# Patient Record
Sex: Male | Born: 1940 | Race: Black or African American | Hispanic: No | Marital: Married | Smoking: Former smoker
Health system: Southern US, Community
[De-identification: ages and names within clinical notes are randomized; demographics above are authoritative.]

## PROBLEM LIST (undated history)

## (undated) DIAGNOSIS — H409 Unspecified glaucoma: Secondary | ICD-10-CM

## (undated) DIAGNOSIS — N400 Enlarged prostate without lower urinary tract symptoms: Secondary | ICD-10-CM

## (undated) DIAGNOSIS — I639 Cerebral infarction, unspecified: Secondary | ICD-10-CM

## (undated) DIAGNOSIS — E785 Hyperlipidemia, unspecified: Secondary | ICD-10-CM

## (undated) DIAGNOSIS — G8194 Hemiplegia, unspecified affecting left nondominant side: Secondary | ICD-10-CM

## (undated) DIAGNOSIS — I5032 Chronic diastolic (congestive) heart failure: Secondary | ICD-10-CM

## (undated) DIAGNOSIS — I509 Heart failure, unspecified: Secondary | ICD-10-CM

## (undated) DIAGNOSIS — R251 Tremor, unspecified: Secondary | ICD-10-CM

## (undated) DIAGNOSIS — I10 Essential (primary) hypertension: Secondary | ICD-10-CM

## (undated) DIAGNOSIS — Z8619 Personal history of other infectious and parasitic diseases: Secondary | ICD-10-CM

## (undated) DIAGNOSIS — F0390 Unspecified dementia without behavioral disturbance: Secondary | ICD-10-CM

## (undated) HISTORY — DX: Unspecified glaucoma: H40.9

## (undated) HISTORY — DX: Hyperlipidemia, unspecified: E78.5

## (undated) HISTORY — PX: OTHER SURGICAL HISTORY: SHX169

## (undated) HISTORY — DX: Cerebral infarction, unspecified: I63.9

## (undated) HISTORY — DX: Hemiplegia, unspecified affecting left nondominant side: G81.94

## (undated) HISTORY — DX: Heart failure, unspecified: I50.9

## (undated) HISTORY — DX: Essential (primary) hypertension: I10

## (undated) HISTORY — PX: CATARACT EXTRACTION: SUR2

## (undated) HISTORY — DX: Personal history of other infectious and parasitic diseases: Z86.19

## (undated) HISTORY — DX: Tremor, unspecified: R25.1

---

## 2019-10-24 ENCOUNTER — Telehealth: Payer: Self-pay

## 2019-10-24 NOTE — Telephone Encounter (Signed)
NOTES FROM [place], [place] [phone].  SENT REFERRAL TO SCHEDULING

## 2019-11-17 NOTE — Progress Notes (Signed)
Cardiology Office Note:    Date:  11/18/2019   ID:  Eric Barrett, DOB Feb 12, 1941, MRN [id number]  PCP:  [name], FNP  Cardiologist:  Donato Heinz, MD  Electrophysiologist:  None   Referring MD: [name], FNP   Chief Complaint  Patient presents with  . New Patient (Initial Visit)  . Hypertension    History of Present Illness:    Eric Barrett is a 79 y.o. male with a hx of chronic diastolic heart failure hypertension, hyperlipidemia, tobacco use, CVA who is referred by Dustin Folks, FNP for evaluation of hypertension and heart failure.  Moved to [facility] in September 2020.  Denies any chest pain, dyspnea, LE edema, lightheadedness, or syncope.  Reports occasional palpitations which he describes as feeling like heart is racing.  Occurs rarely.  States he has had a BP monitor at home but does not check his pressure.  Does not exercise regularly.  TTE on 03/06/2018 showed EF 60%, mild LVH, mild AI, mild MR, mild TR.  No past medical history on file.    Current Medications: Current Meds  Medication Sig  . ASPIRIN 81 PO Take 81 mg by mouth daily.  . Cholecalciferol (VITAMIN D3) 25 MCG (1000 UT) CAPS Take 1,000 Units by mouth daily.  Marland Kitchen losartan (COZAAR) 25 MG tablet Take 25 mg by mouth daily.  . multivitamin-lutein (OCUVITE-LUTEIN) CAPS capsule Take 1 capsule by mouth daily.  . Omega-3 Fatty Acids (FISH OIL) 1000 MG CAPS Take 1 capsule by mouth daily.  . rosuvastatin (CRESTOR) 20 MG tablet Take 20 mg by mouth daily.  . Travoprost, BAK Free, (TRAVATAN) 0.004 % SOLN ophthalmic solution Place 1 drop into both eyes at bedtime.     Allergies:   Patient has no known allergies.   Social History   Socioeconomic History  . Marital status: Married    Spouse name: Not on file  . Number of children: Not on file  . Years of education: Not on file  . Highest education level: Not on file  Occupational History  . Not on file  Tobacco Use  . Smoking status: Not on file   Substance and Sexual Activity  . Alcohol use: Not on file  . Drug use: Not on file  . Sexual activity: Not on file  Other Topics Concern  . Not on file  Social History Narrative  . Not on file   Social Determinants of Health   Financial Resource Strain:   . Difficulty of Paying Living Expenses:   Food Insecurity:   . Worried About Charity fundraiser in the Last Year:   . Arboriculturist in the Last Year:   Transportation Needs:   . Film/video editor (Medical):   Marland Kitchen Lack of Transportation (Non-Medical):   Physical Activity:   . Days of Exercise per Week:   . Minutes of Exercise per Session:   Stress:   . Feeling of Stress :   Social Connections:   . Frequency of Communication with Friends and Family:   . Frequency of Social Gatherings with Friends and Family:   . Attends Religious Services:   . Active Member of Clubs or Organizations:   . Attends Archivist Meetings:   Marland Kitchen Marital Status:      Family History: [name] family history is not on file.  ROS:   Please see the history of present illness.     All other systems reviewed and are negative.  EKGs/Labs/Other Studies Reviewed:    The following studies were reviewed today:   EKG:  EKG is ordered today.  The ekg ordered today demonstrates normal sinus rhythm, rate 74, nonspecific T wave flattening  Recent Labs: No results found for requested labs within last 8760 hours.  Recent Lipid Panel No results found for: CHOL, TRIG, HDL, CHOLHDL, VLDL, LDLCALC, LDLDIRECT  Physical Exam:    VS:  BP 140/84 (BP Location: Right Arm, Patient Position: Sitting, Cuff Size: Normal)   Pulse 74   Temp (!) 97 F (36.1 C)   Ht 5\' 8"  (1.727 m)   Wt 188 lb (85.3 kg)   BMI 28.59 kg/m     Wt Readings from Last 3 Encounters:  11/18/19 188 lb (85.3 kg)     GEN:in no acute distress HEENT: Normal NECK: No JVD LYMPHATICS: No lymphadenopathy CARDIAC: RRR, no murmurs, rubs, gallops RESPIRATORY:  Clear to  auscultation without rales, wheezing or rhonchi  ABDOMEN: Soft, non-tender, non-distended MUSCULOSKELETAL: Trace edema SKIN: Warm and dry NEUROLOGIC:  Alert and oriented x 3 PSYCHIATRIC:  Normal affect   ASSESSMENT:    1. Chronic diastolic heart failure (HCC)   2. Palpitations   3. History of CVA (cerebrovascular accident)   4. Hypertension, unspecified type   5. Hyperlipidemia, unspecified hyperlipidemia type    PLAN:    Chronic diastolic heart failure: Trace edema but otherwise appears euvolemic on exam.  Denies any dyspnea.  Will check TTE.  Will check BMP, BNP  Palpitations: Concerning for arrhythmia.  Given CVA of unclear cause, will evaluate further with 30-day monitor to rule out atrial fibrillation  Hypertension:  losartan 100 mg daily and amlodipine 5 mg daily listed by his PCP, he states he is only taking losartan 25 mg daily.  BP mildly elevated in clinic today.  Asked patient to check his prescriptions when he gets home and call to confirm which medications he is taking.   CVA: On aspirin 81 mg daily and rosuvastatin 20 mg daily  Hyperlipidemia: On rosuvastatin 20 mg daily.  Will check lipid panel.  RTC in 3 months   Medication Adjustments/Labs and Tests Ordered: Current medicines are reviewed at length with the patient today.  Concerns regarding medicines are outlined above.  Orders Placed This Encounter  Procedures  . Renal Function Panel  . CBC  . Brain natriuretic peptide  . Lipid panel  . CARDIAC EVENT MONITOR  . EKG 12-Lead  . ECHOCARDIOGRAM COMPLETE   No orders of the defined types were placed in this encounter.   Patient Instructions  Medication Instructions:  Your physician recommends that you continue on your current medications as directed. Please refer to the Current Medication list given to you today.  *If you need a refill on your cardiac medications before your next appointment, please call your pharmacy*   Lab Work: Today (Renal  function, CBC)  If you have labs (blood work) drawn today and your tests are completely normal, you will receive your results only by: Marland Kitchen MyChart Message (if you have MyChart) OR . A paper copy in the mail If you have any lab test that is abnormal or we need to change your treatment, we will call you to review the results.   Testing/Procedures: Your physician has recommended that you wear an event monitor. Event monitors are medical devices that record the heart's electrical activity. Doctors most often [place] these monitors to diagnose arrhythmias. Arrhythmias are problems with the speed or rhythm of the heartbeat. The monitor is  a small, portable device. You can wear one while you do your normal daily activities. This is usually used to diagnose what is causing palpitations/syncope (passing out).  Your physician has requested that you have an echocardiogram. Echocardiography is a painless test that uses sound waves to create images of your heart. It provides your doctor with information about the size and shape of your heart and how well your heart's chambers and valves are working. This procedure takes approximately one hour. There are no restrictions for this procedure. This will be done at our [facility] location:  [place]: At [facility], you and your health needs are our priority.  As part of our continuing mission to provide you with exceptional heart care, we have created designated Provider Care Teams.  These Care Teams include your primary Cardiologist (physician) and Advanced Practice Providers (APPs -  Physician Assistants and Nurse Practitioners) who all work together to provide you with the care you need, when you need it.  We recommend signing up for the patient portal called "MyChart".  Sign up information is provided on this After Visit Summary.  MyChart is used to connect with patients for Virtual Visits (Telemedicine).  Patients are able to  view lab/test results, encounter notes, upcoming appointments, etc.  Non-urgent messages can be sent to your provider as well.   To learn more about what you can do with MyChart, go to [email or web address].    Your next appointment:   3 month(s)  The format for your next appointment:   In Person  Provider:   Oswaldo Milian, MD   Other Instructions When you get home-please call with your current medication list.        Signed, Donato Heinz, MD  11/18/2019 2:20 PM    [place]

## 2019-11-18 ENCOUNTER — Other Ambulatory Visit: Payer: Self-pay

## 2019-11-18 ENCOUNTER — Ambulatory Visit (INDEPENDENT_AMBULATORY_CARE_PROVIDER_SITE_OTHER): Payer: Medicare Other | Admitting: Cardiology

## 2019-11-18 VITALS — BP 140/84 | HR 74 | Temp 97.0°F | Ht 68.0 in | Wt 188.0 lb

## 2019-11-18 DIAGNOSIS — I5032 Chronic diastolic (congestive) heart failure: Secondary | ICD-10-CM

## 2019-11-18 DIAGNOSIS — I10 Essential (primary) hypertension: Secondary | ICD-10-CM | POA: Diagnosis not present

## 2019-11-18 DIAGNOSIS — Z8673 Personal history of transient ischemic attack (TIA), and cerebral infarction without residual deficits: Secondary | ICD-10-CM

## 2019-11-18 DIAGNOSIS — R002 Palpitations: Secondary | ICD-10-CM

## 2019-11-18 DIAGNOSIS — E785 Hyperlipidemia, unspecified: Secondary | ICD-10-CM

## 2019-11-18 NOTE — Patient Instructions (Signed)
Medication Instructions:  Your physician recommends that you continue on your current medications as directed. Please refer to the Current Medication list given to you today.  *If you need a refill on your cardiac medications before your next appointment, please call your pharmacy*   Lab Work: Today (Renal function, CBC)  If you have labs (blood work) drawn today and your tests are completely normal, you will receive your results only by: Marland Kitchen MyChart Message (if you have MyChart) OR . A paper copy in the mail If you have any lab test that is abnormal or we need to change your treatment, we will call you to review the results.   Testing/Procedures: Your physician has recommended that you wear an event monitor. Event monitors are medical devices that record the heart's electrical activity. Doctors most often [place] these monitors to diagnose arrhythmias. Arrhythmias are problems with the speed or rhythm of the heartbeat. The monitor is a small, portable device. You can wear one while you do your normal daily activities. This is usually used to diagnose what is causing palpitations/syncope (passing out).  Your physician has requested that you have an echocardiogram. Echocardiography is a painless test that uses sound waves to create images of your heart. It provides your doctor with information about the size and shape of your heart and how well your heart's chambers and valves are working. This procedure takes approximately one hour. There are no restrictions for this procedure. This will be done at our [facility] location:  [place]: At [facility], you and your health needs are our priority.  As part of our continuing mission to provide you with exceptional heart care, we have created designated Provider Care Teams.  These Care Teams include your primary Cardiologist (physician) and Advanced Practice Providers (APPs -  Physician Assistants and Nurse  Practitioners) who all work together to provide you with the care you need, when you need it.  We recommend signing up for the patient portal called "MyChart".  Sign up information is provided on this After Visit Summary.  MyChart is used to connect with patients for Virtual Visits (Telemedicine).  Patients are able to view lab/test results, encounter notes, upcoming appointments, etc.  Non-urgent messages can be sent to your provider as well.   To learn more about what you can do with MyChart, go to [email or web address].    Your next appointment:   3 month(s)  The format for your next appointment:   In Person  Provider:   Oswaldo Milian, MD   Other Instructions When you get home-please call with your current medication list.

## 2019-11-19 ENCOUNTER — Telehealth: Payer: Self-pay | Admitting: *Deleted

## 2019-11-19 ENCOUNTER — Encounter: Payer: Self-pay | Admitting: *Deleted

## 2019-11-19 LAB — CBC
Hematocrit: 42.7 % (ref 37.5–51.0)
Hemoglobin: 14.1 g/dL (ref 13.0–17.7)
MCH: 27.3 pg (ref 26.6–33.0)
MCHC: 33 g/dL (ref 31.5–35.7)
MCV: 83 fL (ref 79–97)
Platelets: 210 10*3/uL (ref 150–450)
RBC: 5.17 x10E6/uL (ref 4.14–5.80)
RDW: 14.8 % (ref 11.6–15.4)
WBC: 7.1 10*3/uL (ref 3.4–10.8)

## 2019-11-19 LAB — BRAIN NATRIURETIC PEPTIDE: BNP: 15.8 pg/mL (ref 0.0–100.0)

## 2019-11-19 LAB — LIPID PANEL
Chol/HDL Ratio: 2.3 ratio (ref 0.0–5.0)
Cholesterol, Total: 118 mg/dL (ref 100–199)
HDL: 52 mg/dL (ref >39)
LDL Chol Calc (NIH): 54 mg/dL (ref 0–99)
Triglycerides: 49 mg/dL (ref 0–149)
VLDL Cholesterol Cal: 12 mg/dL (ref 5–40)

## 2019-11-19 LAB — RENAL FUNCTION PANEL
Albumin: 4.5 g/dL (ref 3.7–4.7)
BUN/Creatinine Ratio: 14 (ref 10–24)
BUN: 18 mg/dL (ref 8–27)
CO2: 21 mmol/L (ref 20–29)
Calcium: 9.5 mg/dL (ref 8.6–10.2)
Chloride: 108 mmol/L — ABNORMAL HIGH (ref 96–106)
Creatinine, Ser: 1.27 mg/dL (ref 0.76–1.27)
GFR calc Af Amer: 62 mL/min/{1.73_m2} (ref >59)
GFR calc non Af Amer: 54 mL/min/{1.73_m2} — ABNORMAL LOW (ref >59)
Glucose: 93 mg/dL (ref 65–99)
Phosphorus: 3.4 mg/dL (ref 2.8–4.1)
Potassium: 4.7 mmol/L (ref 3.5–5.2)
Sodium: 145 mmol/L — ABNORMAL HIGH (ref 134–144)

## 2019-11-19 NOTE — Telephone Encounter (Signed)
Patient enrolled for [employer] to ship a 30 day cardiac event montior to his home.  Instructions mailed to patient and will also be included in his monitor kit.

## 2019-11-20 ENCOUNTER — Encounter: Payer: Self-pay | Admitting: *Deleted

## 2019-11-23 ENCOUNTER — Ambulatory Visit (INDEPENDENT_AMBULATORY_CARE_PROVIDER_SITE_OTHER): Payer: Medicare Other

## 2019-11-23 DIAGNOSIS — I639 Cerebral infarction, unspecified: Secondary | ICD-10-CM | POA: Diagnosis not present

## 2019-11-23 DIAGNOSIS — Z8673 Personal history of transient ischemic attack (TIA), and cerebral infarction without residual deficits: Secondary | ICD-10-CM | POA: Diagnosis not present

## 2019-11-23 DIAGNOSIS — R002 Palpitations: Secondary | ICD-10-CM

## 2019-11-27 ENCOUNTER — Encounter: Payer: Self-pay | Admitting: Neurology

## 2019-11-27 ENCOUNTER — Telehealth: Payer: Self-pay | Admitting: Neurology

## 2019-11-27 ENCOUNTER — Other Ambulatory Visit: Payer: Self-pay

## 2019-11-27 ENCOUNTER — Ambulatory Visit (INDEPENDENT_AMBULATORY_CARE_PROVIDER_SITE_OTHER): Payer: Medicare Other | Admitting: Neurology

## 2019-11-27 VITALS — BP 133/77 | HR 71 | Temp 97.4°F | Ht 68.0 in | Wt 187.5 lb

## 2019-11-27 DIAGNOSIS — Z8673 Personal history of transient ischemic attack (TIA), and cerebral infarction without residual deficits: Secondary | ICD-10-CM | POA: Diagnosis not present

## 2019-11-27 DIAGNOSIS — R269 Unspecified abnormalities of gait and mobility: Secondary | ICD-10-CM

## 2019-11-27 DIAGNOSIS — G3281 Cerebellar ataxia in diseases classified elsewhere: Secondary | ICD-10-CM | POA: Diagnosis not present

## 2019-11-27 NOTE — Progress Notes (Signed)
PATIENT: Eric Barrett DOB: 08/14/1940  Chief Complaint  Patient presents with  . Leg Tremors    Reports bilateral leg tremors. Symptoms do not affect his walking. He does have some difficulty getting in and out of his car but feels this is due to weakness. Hx of stoke.  Marland Kitchen PCP    [name], FNP     HISTORICAL  Eric Barrett is a 79 year old male, seen in request by his primary care nurse practitioner Dustin Folks A for evaluation of bilateral lower extremity tremor, initial evaluation was on November 27, 2019.  I have reviewed and summarized the referring note from the referring physician.  He has past medical history of hypertension, hyperlipidemia, tobacco use, reported history of stroke in 2019, presented with acute onset dysarthria, left upper and lower extremity weakness in 2019, was treated at [place], moved to [place] in September 2020, lives in apartment, continue complains of mild left leg weakness, tremorous unsteady sensation when bearing weight, denies significant gait abnormality  He was seen by cardiologist recently, her cardiogram showed ejection fraction 60% in [id number], chronic diastolic heart failure, trace edema, ordered 30 days cardiac monitoring  Laboratory evaluations showed normal BMP, CBC, lipid panel, BMP creatinine of 1.27, REVIEW OF SYSTEMS: Full 14 system review of systems performed and notable only for as above All other review of systems were negative.  ALLERGIES: No Known Allergies  HOME MEDICATIONS: Current Outpatient Medications  Medication Sig Dispense Refill  . amLODipine (NORVASC) 5 MG tablet Take 5 mg by mouth daily.    . ASPIRIN 81 PO Take 2 tablets by mouth daily.     . Cholecalciferol (VITAMIN D3) 25 MCG (1000 UT) CAPS Take 1,000 Units by mouth daily.    Marland Kitchen losartan (COZAAR) 100 MG tablet Take 100 mg by mouth daily.    . multivitamin-lutein (OCUVITE-LUTEIN) CAPS capsule Take 1 capsule by mouth daily.    . Omega-3 Fatty Acids (FISH  OIL) 1000 MG CAPS Take 1 capsule by mouth daily.    . rosuvastatin (CRESTOR) 20 MG tablet Take 20 mg by mouth daily.    . Travoprost, BAK Free, (TRAVATAN) 0.004 % SOLN ophthalmic solution Place 1 drop into both eyes at bedtime.     No current facility-administered medications for this visit.    PAST MEDICAL HISTORY: Past Medical History:  Diagnosis Date  . CHF (congestive heart failure) ([place])   . Glaucoma   . Hemiparesis of left nondominant side ([place])   . History of shingles   . HLD (hyperlipidemia)   . Hypertension   . Stroke ([place])   . Tremor     PAST SURGICAL HISTORY: Past Surgical History:  Procedure Laterality Date  . No past surgery      FAMILY HISTORY: Family History  Problem Relation Age of Onset  . Heart attack Mother   . Heart attack Father     SOCIAL HISTORY: Social History   Socioeconomic History  . Marital status: Married    Spouse name: Not on file  . Number of children: 0  . Years of education: 57  . Highest education level: High school graduate  Occupational History  . Occupation: Retired  Tobacco Use  . Smoking status: Former Research scientist (life sciences)  . Smokeless tobacco: Never Used  Substance and Sexual Activity  . Alcohol use: Not Currently  . Drug use: Never  . Sexual activity: Not on file  Other Topics Concern  . Not on file  Social History Narrative  Lives at home with his wife.   Right-handed.   One cup coffee per day.   Social Determinants of Health   Financial Resource Strain:   . Difficulty of Paying Living Expenses:   Food Insecurity:   . Worried About Charity fundraiser in the Last Year:   . Arboriculturist in the Last Year:   Transportation Needs:   . Film/video editor (Medical):   Marland Kitchen Lack of Transportation (Non-Medical):   Physical Activity:   . Days of Exercise per Week:   . Minutes of Exercise per Session:   Stress:   . Feeling of Stress :   Social Connections:   . Frequency of Communication with Friends and Family:   .  Frequency of Social Gatherings with Friends and Family:   . Attends Religious Services:   . Active Member of Clubs or Organizations:   . Attends Archivist Meetings:   Marland Kitchen Marital Status:   Intimate Partner Violence:   . Fear of Current or Ex-Partner:   . Emotionally Abused:   Marland Kitchen Physically Abused:   . Sexually Abused:      PHYSICAL EXAM   Vitals:   11/27/19 1348  BP: 133/77  Pulse: 71  Temp: (!) 97.4 F (36.3 C)  Weight: 187 lb 8 oz (85 kg)  Height: 5\' 8"  (1.727 m)    Not recorded      Body mass index is 28.51 kg/m.  PHYSICAL EXAMNIATION:  Gen: NAD, conversant, well nourised, well groomed                     Cardiovascular: Regular rate rhythm, no peripheral edema, warm, nontender. Eyes: Conjunctivae clear without exudates or hemorrhage Neck: Supple, no carotid bruits. Pulmonary: Clear to auscultation bilaterally   NEUROLOGICAL EXAM:  MENTAL STATUS: Speech:    Speech is normal; fluent and spontaneous with normal comprehension.  Cognition:     Orientation to time, place and person     Normal recent and remote memory     Normal Attention span and concentration     Normal Language, naming, repeating,spontaneous speech     Fund of knowledge   CRANIAL NERVES: CN II: Visual fields are full to confrontation. Pupils are round equal and briskly reactive to light. CN III, IV, VI: extraocular movement are normal. No ptosis. CN V: Facial sensation is intact to light touch CN VII: Face is symmetric with normal eye closure  CN VIII: Hearing is normal to causal conversation. CN IX, X: Phonation is normal. CN XI: Head turning and shoulder shrug are intact  MOTOR: Mild fixation of left upper extremity upon rapid rotating movement, mild left leg drift  REFLEXES: Hyperreflexia at the left upper and lower extremity,. Plantar responses are extensor on the left side  SENSORY: Intact to light touch, pinprick and vibratory sensation are intact in fingers and  toes.  COORDINATION: There is no trunk or limb dysmetria noted.  GAIT/STANCE: Needs to push up to get up from seated position, steady, left foot tend to pointing outwards,  DIAGNOSTIC DATA (LABS, IMAGING, TESTING) - I reviewed patient records, labs, notes, testing and imaging myself where available.   ASSESSMENT AND PLAN  Eric Barrett is a 79 y.o. male   Reported history of stroke  Per patient presented with sudden onset dysarthria, left arm, leg weakness in 2019, this was treated at [facility]  Mild residual left upper and lower extremity weakness  MRI of the brain  He  has vascular risk factor of aging, hypertension, hyperlipidemia  Continue aspirin 81 mg daily  He Is wearing 30 days cardiac monitoring  Gait abnormality  There are residual mild left upper and lower extremity weakness, likely contributed to his complaints of unsteadiness   [name], M.D. Ph.D.  [facility] [address] [place], [place] [address] Ph: [phone] Fax: [phone]  CC: Referring Provider

## 2019-11-27 NOTE — Telephone Encounter (Signed)
Medicare order sent to GI. No auth they will reach out to the patient to schedule.  

## 2019-12-03 ENCOUNTER — Ambulatory Visit (HOSPITAL_COMMUNITY): Payer: Medicare Other | Attending: Cardiovascular Disease

## 2019-12-03 ENCOUNTER — Other Ambulatory Visit: Payer: Self-pay

## 2019-12-03 DIAGNOSIS — R002 Palpitations: Secondary | ICD-10-CM | POA: Diagnosis not present

## 2019-12-03 DIAGNOSIS — I5032 Chronic diastolic (congestive) heart failure: Secondary | ICD-10-CM

## 2019-12-03 DIAGNOSIS — I10 Essential (primary) hypertension: Secondary | ICD-10-CM | POA: Insufficient documentation

## 2019-12-12 ENCOUNTER — Other Ambulatory Visit: Payer: Self-pay | Admitting: *Deleted

## 2019-12-12 DIAGNOSIS — Z01812 Encounter for preprocedural laboratory examination: Secondary | ICD-10-CM

## 2019-12-12 DIAGNOSIS — I77819 Aortic ectasia, unspecified site: Secondary | ICD-10-CM

## 2019-12-12 DIAGNOSIS — I712 Thoracic aortic aneurysm, without rupture, unspecified: Secondary | ICD-10-CM

## 2019-12-15 ENCOUNTER — Telehealth: Payer: Self-pay | Admitting: Cardiology

## 2019-12-15 NOTE — Telephone Encounter (Signed)
Patient is unsure when he need to remove his monitor.

## 2019-12-15 NOTE — Telephone Encounter (Signed)
Spoke with patient regarding appointemtn for MRA chest/aosrta scheduled Thursday 01/01/20 at 5:00pm at [facility]---arrival time is 4:30 pm 1st floor admissions office----patient to come in 12/23/19 for lab work.

## 2019-12-15 NOTE — Telephone Encounter (Signed)
Returned the call to the patient. He stated that he already had his answer (30 day)

## 2019-12-24 ENCOUNTER — Other Ambulatory Visit: Payer: Self-pay | Admitting: Cardiology

## 2019-12-24 ENCOUNTER — Other Ambulatory Visit: Payer: Medicare Other | Admitting: *Deleted

## 2019-12-24 ENCOUNTER — Other Ambulatory Visit: Payer: Self-pay | Admitting: *Deleted

## 2019-12-24 ENCOUNTER — Other Ambulatory Visit: Payer: Self-pay

## 2019-12-24 DIAGNOSIS — I77819 Aortic ectasia, unspecified site: Secondary | ICD-10-CM

## 2019-12-24 DIAGNOSIS — Z8673 Personal history of transient ischemic attack (TIA), and cerebral infarction without residual deficits: Secondary | ICD-10-CM

## 2019-12-24 DIAGNOSIS — I639 Cerebral infarction, unspecified: Secondary | ICD-10-CM

## 2019-12-24 DIAGNOSIS — R002 Palpitations: Secondary | ICD-10-CM

## 2019-12-24 DIAGNOSIS — Z01812 Encounter for preprocedural laboratory examination: Secondary | ICD-10-CM

## 2019-12-24 LAB — BASIC METABOLIC PANEL
BUN/Creatinine Ratio: 13 (ref 10–24)
BUN: 17 mg/dL (ref 8–27)
CO2: 27 mmol/L (ref 20–29)
Calcium: 9.9 mg/dL (ref 8.6–10.2)
Chloride: 102 mmol/L (ref 96–106)
Creatinine, Ser: 1.36 mg/dL — ABNORMAL HIGH (ref 0.76–1.27)
GFR calc Af Amer: 57 mL/min/{1.73_m2} — ABNORMAL LOW (ref >59)
GFR calc non Af Amer: 49 mL/min/{1.73_m2} — ABNORMAL LOW (ref >59)
Glucose: 95 mg/dL (ref 65–99)
Potassium: 4.9 mmol/L (ref 3.5–5.2)
Sodium: 141 mmol/L (ref 134–144)

## 2019-12-30 ENCOUNTER — Telehealth: Payer: Self-pay | Admitting: Cardiology

## 2019-12-30 NOTE — Telephone Encounter (Signed)
Patient's wife is requesting to reschedule MR MRA CHEST W WO CONTRAST currentyly scheduled for 01/01/20 at 5:00 PM. She states the patient prefers an appointment that is sooner in the day. Please call.

## 2020-01-01 ENCOUNTER — Ambulatory Visit (HOSPITAL_COMMUNITY): Payer: Medicare Other

## 2020-01-08 ENCOUNTER — Telehealth: Payer: Self-pay | Admitting: Cardiology

## 2020-01-08 NOTE — Telephone Encounter (Signed)
Pt stated they were returning a call. Looked through chart to see who may have called them.

## 2020-01-13 ENCOUNTER — Encounter: Payer: Self-pay | Admitting: *Deleted

## 2020-01-28 ENCOUNTER — Ambulatory Visit (HOSPITAL_COMMUNITY)
Admission: RE | Admit: 2020-01-28 | Discharge: 2020-01-28 | Disposition: A | Discharge status: Home / Self Care | Payer: Medicare Other | Source: Ambulatory Visit | Attending: Cardiology | Admitting: Cardiology

## 2020-01-28 ENCOUNTER — Other Ambulatory Visit: Payer: Self-pay

## 2020-01-28 DIAGNOSIS — I77819 Aortic ectasia, unspecified site: Secondary | ICD-10-CM | POA: Diagnosis present

## 2020-01-28 MED ORDER — GADOBUTROL 1 MMOL/ML IV SOLN
9.0000 mL | Freq: Once | INTRAVENOUS | Status: AC | PRN
  Administered 2020-01-28: 9 mL via INTRAVENOUS

## 2020-02-15 NOTE — Progress Notes (Deleted)
Cardiology Office Note:    Date:  02/15/2020   ID:  Eric Barrett, DOB 1941/01/01, MRN [id number]  PCP:  [name], FNP  Cardiologist:  Donato Heinz, MD  Electrophysiologist:  None   Referring MD: [name], FNP   No chief complaint on file.   History of Present Illness:    Eric Barrett is a 79 y.o. male with a hx of chronic diastolic heart failure hypertension, hyperlipidemia, tobacco use, CVA who presents for follow-up.  He was referred by Dustin Folks, FNP for evaluation of hypertension and heart failure, initially seen on 11/18/2019.  Moved to [facility] in September 2020.  Denies any chest pain, dyspnea, LE edema, lightheadedness, or syncope.  Reports occasional palpitations which he describes as feeling like heart is racing.  Occurs rarely.  States he has had a BP monitor at home but does not check his pressure.  Does not exercise regularly.  TTE on 03/06/2018 showed EF 60%, mild LVH, mild AI, mild MR, mild TR.  Echocardiogram on 12/03/2019 showed normal biventricular function, grade 1 diastolic dysfunction, mild AI, mild ascending aortic dilatation measuring 38 mm.  30-day cardiac monitor on 12/24/2019 showed no significant abnormalities.  MRA chest on 01/28/2020 showed mild ascending aortic ectasia measuring 36 mm.  Past Medical History:  Diagnosis Date  . CHF (congestive heart failure) ([place])   . Glaucoma   . Hemiparesis of left nondominant side ([place])   . History of shingles   . HLD (hyperlipidemia)   . Hypertension   . Stroke ([place])   . Tremor       Current Medications: No outpatient medications have been marked as taking for the 02/16/20 encounter (Appointment) with Donato Heinz, MD.     Allergies:   Patient has no known allergies.   Social History   Socioeconomic History  . Marital status: Married    Spouse name: Not on file  . Number of children: 0  . Years of education: 85  . Highest education level: High school graduate  Occupational  History  . Occupation: Retired  Tobacco Use  . Smoking status: Former Research scientist (life sciences)  . Smokeless tobacco: Never Used  Substance and Sexual Activity  . Alcohol use: Not Currently  . Drug use: Never  . Sexual activity: Not on file  Other Topics Concern  . Not on file  Social History Narrative   Lives at home with his wife.   Right-handed.   One cup coffee per day.   Social Determinants of Health   Financial Resource Strain:   . Difficulty of Paying Living Expenses:   Food Insecurity:   . Worried About Charity fundraiser in the Last Year:   . Arboriculturist in the Last Year:   Transportation Needs:   . Film/video editor (Medical):   Marland Kitchen Lack of Transportation (Non-Medical):   Physical Activity:   . Days of Exercise per Week:   . Minutes of Exercise per Session:   Stress:   . Feeling of Stress :   Social Connections:   . Frequency of Communication with Friends and Family:   . Frequency of Social Gatherings with Friends and Family:   . Attends Religious Services:   . Active Member of Clubs or Organizations:   . Attends Archivist Meetings:   Marland Kitchen Marital Status:      Family History: [name] family history includes Heart attack in his father and mother.  ROS:   Please see  the history of present illness.     All other systems reviewed and are negative.  EKGs/Labs/Other Studies Reviewed:    The following studies were reviewed today:   EKG:  EKG is ordered today.  The ekg ordered today demonstrates normal sinus rhythm, rate 74, nonspecific T wave flattening  Recent Labs: 11/18/2019: BNP 15.8; Hemoglobin 14.1; Platelets 210 12/24/2019: BUN 17; Creatinine, Ser 1.36; Potassium 4.9; Sodium 141  Recent Lipid Panel    Component Value Date/Time   CHOL 118 11/18/2019 1446   TRIG 49 11/18/2019 1446   HDL 52 11/18/2019 1446   CHOLHDL 2.3 11/18/2019 1446   LDLCALC 54 11/18/2019 1446    Physical Exam:    VS:  There were no vitals taken for this visit.    Wt  Readings from Last 3 Encounters:  11/27/19 187 lb 8 oz (85 kg)  11/18/19 188 lb (85.3 kg)     GEN:in no acute distress HEENT: Normal NECK: No JVD LYMPHATICS: No lymphadenopathy CARDIAC: RRR, no murmurs, rubs, gallops RESPIRATORY:  Clear to auscultation without rales, wheezing or rhonchi  ABDOMEN: Soft, non-tender, non-distended MUSCULOSKELETAL: Trace edema SKIN: Warm and dry NEUROLOGIC:  Alert and oriented x 3 PSYCHIATRIC:  Normal affect   ASSESSMENT:    No diagnosis found. PLAN:    Chronic diastolic heart failure: Reported history, only grade 1 diastolic dysfunction on recent echo.  BNP 16 at initial clinic visit, does not appear volume overloaded  Palpitations: Concerning for arrhythmia.  Given CVA of unclear cause, 30-day monitor was ordered.  Showed no evidence of atrial fibrillation.  Hypertension:  losartan 100 mg daily and amlodipine 5 mg daily listed by his PCP, he states he is only taking losartan 25 mg daily.  BP mildly elevated in clinic today.  Asked patient to check his prescriptions when he gets home and call to confirm which medications he is taking.   CVA: On aspirin 81 mg daily and rosuvastatin 20 mg daily  Hyperlipidemia: On rosuvastatin 20 mg daily.  LDL 54 on 11/18/2019.*  RTC in***   Medication Adjustments/Labs and Tests Ordered: Current medicines are reviewed at length with the patient today.  Concerns regarding medicines are outlined above.  No orders of the defined types were placed in this encounter.  No orders of the defined types were placed in this encounter.   There are no Patient Instructions on file for this visit.   Signed, Donato Heinz, MD  02/15/2020 10:22 PM    [facility]

## 2020-02-16 ENCOUNTER — Ambulatory Visit: Payer: Medicare Other | Admitting: Cardiology

## 2020-02-16 ENCOUNTER — Encounter: Payer: Self-pay | Admitting: Cardiology

## 2020-02-16 ENCOUNTER — Telehealth: Payer: Self-pay

## 2020-02-16 NOTE — Telephone Encounter (Addendum)
Pt had appt today to see Dr. Gardiner Rhyme but he had taken a cab to the [employer].. he was confused that his appt was at [facility]. He said he was unable to get another cab to take him to the other office so he asked that we call him re: his MRI results and to determine if he needs follow up at this time or can it be postponed for another time based on his results.   His wife Lelon Frohlich [phone]

## 2020-02-17 NOTE — Telephone Encounter (Signed)
Spoke to wife and patient-aware appt not urgent, all testing okay per Dr. Gardiner Rhyme.   Appt cancelled 7/15 with APP and rescheduled for 9/3 with Dr. Gardiner Rhyme, patient and wife aware and verbalized understanding.

## 2020-02-19 ENCOUNTER — Telehealth: Payer: Medicare Other | Admitting: Adult Health

## 2020-02-26 ENCOUNTER — Encounter: Payer: Self-pay | Admitting: Neurology

## 2020-02-26 ENCOUNTER — Telehealth: Payer: Self-pay | Admitting: Neurology

## 2020-02-26 ENCOUNTER — Ambulatory Visit (INDEPENDENT_AMBULATORY_CARE_PROVIDER_SITE_OTHER): Payer: Medicare Other | Admitting: Neurology

## 2020-02-26 VITALS — BP 129/66 | HR 69 | Ht 68.0 in | Wt 186.2 lb

## 2020-02-26 DIAGNOSIS — Z8673 Personal history of transient ischemic attack (TIA), and cerebral infarction without residual deficits: Secondary | ICD-10-CM | POA: Diagnosis not present

## 2020-02-26 DIAGNOSIS — R269 Unspecified abnormalities of gait and mobility: Secondary | ICD-10-CM

## 2020-02-26 NOTE — Progress Notes (Signed)
PATIENT: Eric Barrett DOB: 12-23-1940  REASON FOR VISIT: follow up HISTORY FROM: patient  HISTORY OF PRESENT ILLNESS: Today 02/26/20  HISTORY Eric Barrett is a 79 year old male, seen in request by his primary care nurse practitioner Dustin Folks A for evaluation of bilateral lower extremity tremor, initial evaluation was on November 27, 2019.  I have reviewed and summarized the referring note from the referring physician.  He has past medical history of hypertension, hyperlipidemia, tobacco use, reported history of stroke in 2019, presented with acute onset dysarthria, left upper and lower extremity weakness in 2019, was treated at [place], moved to [place] in September 2020, lives in apartment, continue complains of mild left leg weakness, tremorous unsteady sensation when bearing weight, denies significant gait abnormality  He was seen by cardiologist recently, her cardiogram showed ejection fraction 60% in 3009, chronic diastolic heart failure, trace edema, ordered 30 days cardiac monitoring   Update February 26, 2020 SS: Here today to follow-up on sensation of leg weakness, mostly in the left and tremor in the leg, usually when getting in and out of the car.  Has significantly improved, as he is exercising and walking more, but sensation is still present.  Describes this post his stroke in 2019 in [place]. Onset stroke at that time presented with generalized weakness, confusion.  Remains on aspirin.  Has follow-up with cardiology, MRA chest showed mild aortic dilation.  MRI of the brain was ordered but is yet to be completed. No weakness of the left arm reported. Cardiac monitor study showed no significant heart rhythm abnormalities.  REVIEW OF SYSTEMS: Out of a complete 14 system review of symptoms, the patient complains only of the following symptoms, and all other reviewed systems are negative.  N/A  ALLERGIES: No Known Allergies  HOME MEDICATIONS: Outpatient Medications Prior  to Visit  Medication Sig Dispense Refill  . amLODipine (NORVASC) 5 MG tablet Take 5 mg by mouth daily.    . ASPIRIN 81 PO Take 2 tablets by mouth daily.     . Cholecalciferol (VITAMIN D3) 25 MCG (1000 UT) CAPS Take 1,000 Units by mouth daily.    Marland Kitchen losartan (COZAAR) 100 MG tablet Take 100 mg by mouth daily.    . multivitamin-lutein (OCUVITE-LUTEIN) CAPS capsule Take 1 capsule by mouth daily.    . Omega-3 Fatty Acids (FISH OIL) 1000 MG CAPS Take 1 capsule by mouth daily.    . rosuvastatin (CRESTOR) 20 MG tablet Take 20 mg by mouth daily.    . Travoprost, BAK Free, (TRAVATAN) 0.004 % SOLN ophthalmic solution Place 1 drop into both eyes at bedtime.     No facility-administered medications prior to visit.    PAST MEDICAL HISTORY: Past Medical History:  Diagnosis Date  . CHF (congestive heart failure) ([place])   . Glaucoma   . Hemiparesis of left nondominant side ([place])   . History of shingles   . HLD (hyperlipidemia)   . Hypertension   . Stroke ([place])   . Tremor     PAST SURGICAL HISTORY: Past Surgical History:  Procedure Laterality Date  . No past surgery      FAMILY HISTORY: Family History  Problem Relation Age of Onset  . Heart attack Mother   . Heart attack Father     SOCIAL HISTORY: Social History   Socioeconomic History  . Marital status: Married    Spouse name: Not on file  . Number of children: 0  . Years of education: 42  . Highest  education level: High school graduate  Occupational History  . Occupation: Retired  Tobacco Use  . Smoking status: Former Research scientist (life sciences)  . Smokeless tobacco: Never Used  Substance and Sexual Activity  . Alcohol use: Not Currently  . Drug use: Never  . Sexual activity: Not on file  Other Topics Concern  . Not on file  Social History Narrative   Lives at home with his wife.   Right-handed.   One cup coffee per day.   Social Determinants of Health   Financial Resource Strain:   . Difficulty of Paying Living Expenses:   Food  Insecurity:   . Worried About Charity fundraiser in the Last Year:   . Arboriculturist in the Last Year:   Transportation Needs:   . Film/video editor (Medical):   Marland Kitchen Lack of Transportation (Non-Medical):   Physical Activity:   . Days of Exercise per Week:   . Minutes of Exercise per Session:   Stress:   . Feeling of Stress :   Social Connections:   . Frequency of Communication with Friends and Family:   . Frequency of Social Gatherings with Friends and Family:   . Attends Religious Services:   . Active Member of Clubs or Organizations:   . Attends Archivist Meetings:   Marland Kitchen Marital Status:   Intimate Partner Violence:   . Fear of Current or Ex-Partner:   . Emotionally Abused:   Marland Kitchen Physically Abused:   . Sexually Abused:    PHYSICAL EXAM  Vitals:   02/26/20 1454  BP: (!) 129/66  Pulse: 69  Weight: 186 lb 3.2 oz (84.5 kg)  Height: 5\' 8"  (1.727 m)   Body mass index is 28.31 kg/m.  Generalized: Well developed, in no acute distress   Neurological examination  Mentation: Alert oriented to time, place, history taking. Follows all commands speech and language fluent Cranial nerve II-XII: Pupils were equal round reactive to light. Extraocular movements were full, visual field were full on confrontational test. Facial sensation and strength were normal. Head turning and shoulder shrug  were normal and symmetric. Motor: The motor testing reveals 5 over 5 strength of all 4 extremities. Good symmetric motor tone is noted throughout. No tremor noted. Sensory: Sensory testing is intact to soft touch on all 4 extremities. No evidence of extinction is noted.  Coordination: Cerebellar testing reveals good finger-nose-finger and heel-to-shin bilaterally.  Gait and station: Gait is steady, tendency to rotate left foot outward Reflexes: Deep tendon reflexes are symmetric and normal bilaterally (Dr. Krista Blue previously noted hyperreflexia on the left upper and lower)  DIAGNOSTIC DATA  (LABS, IMAGING, TESTING) - I reviewed patient records, labs, notes, testing and imaging myself where available.  Lab Results  Component Value Date   WBC 7.1 11/18/2019   HGB 14.1 11/18/2019   HCT 42.7 11/18/2019   MCV 83 11/18/2019   PLT 210 11/18/2019      Component Value Date/Time   NA 141 12/24/2019 1052   K 4.9 12/24/2019 1052   CL 102 12/24/2019 1052   CO2 27 12/24/2019 1052   GLUCOSE 95 12/24/2019 1052   BUN 17 12/24/2019 1052   CREATININE 1.36 (H) 12/24/2019 1052   CALCIUM 9.9 12/24/2019 1052   ALBUMIN 4.5 11/18/2019 1447   GFRNONAA 49 (L) 12/24/2019 1052   GFRAA 57 (L) 12/24/2019 1052   Lab Results  Component Value Date   CHOL 118 11/18/2019   HDL 52 11/18/2019   LDLCALC 54 11/18/2019  TRIG 49 11/18/2019   CHOLHDL 2.3 11/18/2019   No results found for: HGBA1C No results found for: VITAMINB12 No results found for: TSH    ASSESSMENT AND PLAN 79 y.o. year old male  has a past medical history of CHF (congestive heart failure) ([place]), Glaucoma, Hemiparesis of left nondominant side ([place]), History of shingles, HLD (hyperlipidemia), Hypertension, Stroke ([place]), and Tremor. here with:  1.  Reported history of stroke -History of stroke in [place], reported post stroke, leg weakness, mostly the leg but could be both, tremor in left leg, symptoms most notable when getting in and out of cars -MRI of the brain has yet to be completed, I will get this set up -Has vascular risk factors of aging, hypertension, hyperlipidemia -Recommend continue aspirin 81 mg daily -Cardiac monitor study did not show significant heart rhythm abnormalities -Symptoms much improved with walking, and exercise -He will follow-up in 6 months or sooner if needed, will update once MRI results   I spent 30 minutes of face-to-face and non-face-to-face time with patient.  This included previsit chart review, lab review, study review, order entry, electronic health record documentation, patient  education.  Butler Denmark, AGNP-C, DNP 02/26/2020, 4:51 PM [facility] [address] [place], [place] [address] [phone]

## 2020-02-26 NOTE — Telephone Encounter (Signed)
Please get patient set up for MRI of the brain, was ordered by Dr. Krista Blue in April, was not completed.

## 2020-02-26 NOTE — Patient Instructions (Signed)
Continue current medications Get you set up for MRI of the brain  See you back in 6 months

## 2020-02-28 NOTE — Telephone Encounter (Signed)
Gi reached out to the patient on 12/01/19 and left a voicemail. Order sent again to GI. They will reach out to the patient to schedule.

## 2020-04-08 NOTE — Progress Notes (Addendum)
Cardiology Office Note:    Date:  04/09/2020   ID:  Eric Barrett, DOB January 10, 1941, MRN [id number]  PCP:  [name], FNP  Cardiologist:  Donato Heinz, MD  Electrophysiologist:  None   Referring MD: [name], FNP   Chief Complaint  Patient presents with  . Palpitations    History of Present Illness:    Eric Barrett is a 79 y.o. male with a hx of chronic diastolic heart failure hypertension, hyperlipidemia, tobacco use, CVA who presents for follow-up.  He was referred by Dustin Folks, FNP for evaluation of hypertension and heart failure, initially seen on 11/18/2019.  Moved to [facility] in September 2020.  Denies any chest pain, dyspnea, LE edema, lightheadedness, or syncope.  Reports occasional palpitations which he describes as feeling like heart is racing.  Occurs rarely.  States he has had a BP monitor at home but does not check his pressure.  Does not exercise regularly.  TTE on 03/06/2018 showed EF 60%, mild LVH, mild AI, mild MR, mild TR.  Echocardiogram 11/25/2019 showed normal biventricular function, grade 1 diastolic dysfunction, mild AI, mild dilatation of the ascending aorta measuring 38 mm.  Cardiac monitor showed no significant abnormalities.  MRA chest showed mild ectasia of the ascending aorta measuring 36 mm.  Since last clinic visit, he reports that he has been doing well.  He denies any chest pain or dyspnea.  Reports 1 episode of lightheadedness recently but denies any syncope.  Reports rare palpitations lasting for up to 1 minute, can be months between episodes.  Denies any lower extremity edema.  He has not been exercising but is trying to start walking more.  He has not been checking his BP at home.   Wt Readings from Last 3 Encounters:  04/09/20 183 lb 8 oz (83.2 kg)  02/26/20 186 lb 3.2 oz (84.5 kg)  11/27/19 187 lb 8 oz (85 kg)     Past Medical History:  Diagnosis Date  . CHF (congestive heart failure) ([place])   . Glaucoma   . Hemiparesis of  left nondominant side ([place])   . History of shingles   . HLD (hyperlipidemia)   . Hypertension   . Stroke ([place])   . Tremor       Current Medications: Current Meds  Medication Sig  . amLODipine (NORVASC) 5 MG tablet Take 5 mg by mouth daily.  . ASPIRIN 81 PO Take 2 tablets by mouth daily.   . Cholecalciferol (VITAMIN D3) 25 MCG (1000 UT) CAPS Take 1,000 Units by mouth daily.  . multivitamin-lutein (OCUVITE-LUTEIN) CAPS capsule Take 1 capsule by mouth daily.  . Omega-3 Fatty Acids (FISH OIL) 1000 MG CAPS Take 1 capsule by mouth daily.  . rosuvastatin (CRESTOR) 20 MG tablet Take 20 mg by mouth daily.  . Travoprost, BAK Free, (TRAVATAN) 0.004 % SOLN ophthalmic solution Place 1 drop into both eyes at bedtime.  . [DISCONTINUED] losartan (COZAAR) 100 MG tablet Take 100 mg by mouth daily.     Allergies:   Patient has no known allergies.   Social History   Socioeconomic History  . Marital status: Married    Spouse name: Not on file  . Number of children: 0  . Years of education: 68  . Highest education level: High school graduate  Occupational History  . Occupation: Retired  Tobacco Use  . Smoking status: Former Research scientist (life sciences)  . Smokeless tobacco: Never Used  Substance and Sexual Activity  . Alcohol use: Not Currently  .  Drug use: Never  . Sexual activity: Not on file  Other Topics Concern  . Not on file  Social History Narrative   Lives at home with his wife.   Right-handed.   One cup coffee per day.   Social Determinants of Health   Financial Resource Strain:   . Difficulty of Paying Living Expenses: Not on file  Food Insecurity:   . Worried About Charity fundraiser in the Last Year: Not on file  . Ran Out of Food in the Last Year: Not on file  Transportation Needs:   . Lack of Transportation (Medical): Not on file  . Lack of Transportation (Non-Medical): Not on file  Physical Activity:   . Days of Exercise per Week: Not on file  . Minutes of Exercise per Session: Not  on file  Stress:   . Feeling of Stress : Not on file  Social Connections:   . Frequency of Communication with Friends and Family: Not on file  . Frequency of Social Gatherings with Friends and Family: Not on file  . Attends Religious Services: Not on file  . Active Member of Clubs or Organizations: Not on file  . Attends Archivist Meetings: Not on file  . Marital Status: Not on file     Family History: [name] family history includes Heart attack in his father and mother.  ROS:   Please see the history of present illness.     All other systems reviewed and are negative.  EKGs/Labs/Other Studies Reviewed:    The following studies were reviewed today:   EKG:  EKG is ordered today.  The ekg ordered today demonstrates normal sinus rhythm, rate 74, nonspecific T wave flattening  Recent Labs: 11/18/2019: BNP 15.8; Hemoglobin 14.1; Platelets 210 12/24/2019: BUN 17; Creatinine, Ser 1.36; Potassium 4.9; Sodium 141  Recent Lipid Panel    Component Value Date/Time   CHOL 118 11/18/2019 1446   TRIG 49 11/18/2019 1446   HDL 52 11/18/2019 1446   CHOLHDL 2.3 11/18/2019 1446   LDLCALC 54 11/18/2019 1446    Physical Exam:    VS:  BP 126/60   Pulse 77   Ht 5\' 8"  (1.727 m)   Wt 183 lb 8 oz (83.2 kg)   SpO2 96%   BMI 27.90 kg/m     Wt Readings from Last 3 Encounters:  04/09/20 183 lb 8 oz (83.2 kg)  02/26/20 186 lb 3.2 oz (84.5 kg)  11/27/19 187 lb 8 oz (85 kg)     GEN:in no acute distress HEENT: Normal NECK: No JVD LYMPHATICS: No lymphadenopathy CARDIAC: RRR, no murmurs, rubs, gallops RESPIRATORY:  Clear to auscultation without rales, wheezing or rhonchi  ABDOMEN: Soft, non-tender, non-distended MUSCULOSKELETAL: Trace edema SKIN: Warm and dry NEUROLOGIC:  Alert and oriented x 3 PSYCHIATRIC:  Normal affect   ASSESSMENT:    1. Chronic diastolic heart failure ([place])   2. Dilatation of aorta (HCC)   3. History of CVA (cerebrovascular accident)   4.  Palpitations   5. Hyperlipidemia, unspecified hyperlipidemia type   6. Hypertension, unspecified type    PLAN:    Chronic diastolic heart failure: Appears euvolemic.  Echocardiogram shows only grade 1 diastolic dysfunction.  Palpitations: 30-day monitor showed no significant abnormalities.  He reports rare palpitations  Hypertension:  on amlodipine 5 mg daily.  Appears controlled  CVA: On aspirin 81 mg daily and rosuvastatin 20 mg daily.  Unclear cause, 30-day monitor showed no evidence of atrial fibrillation  Hyperlipidemia:  On rosuvastatin 20 mg daily.  LDL 54 on 11/18/2019  Aortic dilatation: MRA chest showed mild ectasia of the ascending aorta measuring 36 mm.  RTC in 1 year   Medication Adjustments/Labs and Tests Ordered: Current medicines are reviewed at length with the patient today.  Concerns regarding medicines are outlined above.  No orders of the defined types were placed in this encounter.  No orders of the defined types were placed in this encounter.   Patient Instructions  Medication Instructions:  Your physician recommends that you continue on your current medications as directed. Please refer to the Current Medication list given to you today.  *If you need a refill on your cardiac medications before your next appointment, please call your pharmacy*  Follow-Up: At [facility], you and your health needs are our priority.  As part of our continuing mission to provide you with exceptional heart care, we have created designated Provider Care Teams.  These Care Teams include your primary Cardiologist (physician) and Advanced Practice Providers (APPs -  Physician Assistants and Nurse Practitioners) who all work together to provide you with the care you need, when you need it.  We recommend signing up for the patient portal called "MyChart".  Sign up information is provided on this After Visit Summary.  MyChart is used to connect with patients for Virtual Visits  (Telemedicine).  Patients are able to view lab/test results, encounter notes, upcoming appointments, etc.  Non-urgent messages can be sent to your provider as well.   To learn more about what you can do with MyChart, go to [email or web address].    Your next appointment:   12 month(s)  The format for your next appointment:   In Person  Provider:   Oswaldo Milian, MD        Signed, Donato Heinz, MD  04/09/2020 5:29 PM    [place]

## 2020-04-09 ENCOUNTER — Other Ambulatory Visit: Payer: Self-pay

## 2020-04-09 ENCOUNTER — Ambulatory Visit (INDEPENDENT_AMBULATORY_CARE_PROVIDER_SITE_OTHER): Payer: Medicare Other | Admitting: Cardiology

## 2020-04-09 ENCOUNTER — Encounter: Payer: Self-pay | Admitting: Cardiology

## 2020-04-09 VITALS — BP 126/60 | HR 77 | Ht 68.0 in | Wt 183.5 lb

## 2020-04-09 DIAGNOSIS — R002 Palpitations: Secondary | ICD-10-CM

## 2020-04-09 DIAGNOSIS — I10 Essential (primary) hypertension: Secondary | ICD-10-CM

## 2020-04-09 DIAGNOSIS — I77819 Aortic ectasia, unspecified site: Secondary | ICD-10-CM

## 2020-04-09 DIAGNOSIS — Z8673 Personal history of transient ischemic attack (TIA), and cerebral infarction without residual deficits: Secondary | ICD-10-CM | POA: Diagnosis not present

## 2020-04-09 DIAGNOSIS — I5032 Chronic diastolic (congestive) heart failure: Secondary | ICD-10-CM

## 2020-04-09 DIAGNOSIS — E785 Hyperlipidemia, unspecified: Secondary | ICD-10-CM

## 2020-04-09 NOTE — Patient Instructions (Signed)

## 2020-08-23 ENCOUNTER — Ambulatory Visit: Payer: Medicare Other | Admitting: Physical Therapy

## 2020-08-26 ENCOUNTER — Ambulatory Visit: Payer: Medicare Other | Admitting: Physical Therapy

## 2020-08-30 ENCOUNTER — Ambulatory Visit: Payer: Medicare Other | Admitting: Neurology

## 2020-09-20 ENCOUNTER — Ambulatory Visit: Payer: Medicare Other | Admitting: Physical Therapy

## 2020-09-21 ENCOUNTER — Ambulatory Visit: Payer: Medicare Other | Attending: Family | Admitting: Physical Therapy

## 2020-09-21 ENCOUNTER — Encounter: Payer: Self-pay | Admitting: Physical Therapy

## 2020-09-21 ENCOUNTER — Other Ambulatory Visit: Payer: Self-pay

## 2020-09-21 DIAGNOSIS — R262 Difficulty in walking, not elsewhere classified: Secondary | ICD-10-CM | POA: Diagnosis present

## 2020-09-21 DIAGNOSIS — R296 Repeated falls: Secondary | ICD-10-CM | POA: Insufficient documentation

## 2020-09-21 DIAGNOSIS — M6281 Muscle weakness (generalized): Secondary | ICD-10-CM | POA: Insufficient documentation

## 2020-09-21 DIAGNOSIS — R2681 Unsteadiness on feet: Secondary | ICD-10-CM | POA: Diagnosis present

## 2020-09-21 NOTE — Therapy (Signed)
[facility] [address] [place], [place], [address] Phone: [phone]   Fax:  [phone]  Physical Therapy Evaluation  Patient Details  Name: Eric Barrett MRN: [id number] Date of Birth: November 06, 1940 Referring Provider (PT): [name], FNP   Encounter Date: 09/21/2020   PT End of Session - 09/21/20 2128    Visit Number 1    Number of Visits 17    Date for PT Re-Evaluation 11/20/20    Authorization Type Medicare    Progress Note Due on Visit 10    PT Start Time 6314    PT Stop Time 1447    PT Time Calculation (min) 40 min    Activity Tolerance Patient tolerated treatment well    Behavior During Therapy [facility] for tasks assessed/performed           Past Medical History:  Diagnosis Date  . CHF (congestive heart failure) ([place])   . Glaucoma   . Hemiparesis of left nondominant side ([place])   . History of shingles   . HLD (hyperlipidemia)   . Hypertension   . Stroke ([place])   . Tremor     Past Surgical History:  Procedure Laterality Date  . No past surgery      There were no vitals filed for this visit.    Subjective Assessment - 09/21/20 1417    Subjective Pt and wife report CVA in 2019.  Report difficulty getting into and out of a vehicle, multiple falls especially when stepping up or stepping down surfaces and short term memory loss. Pt reports he is more off balance in the morning.  Pt reports no difficulty with sleep.  Denies dizziness.    Patient is accompained by: Family member    Pertinent History CHF    Patient Stated Goals To improve balance and decrease falls; wife would like to address memory impairments    Currently in Pain? No/denies              [facility] PT Assessment - 09/21/20 1420      Assessment   Medical Diagnosis L hemiparesis, falls, CVA in 2019    Referring Provider (PT) [name], FNP    Onset Date/Surgical Date 07/27/20    Prior Therapy HH in [place] after CVA       Precautions   Precautions Fall    Precaution Comments CHF, glaucoma, Hemiparesis of L nondominant side, shingles, HTN, HLD, tremor      Balance Screen   Has the patient fallen in the past 6 months Yes    How many times? 4    Has the patient had a decrease in activity level because of a fear of falling?  Yes      [place] Private residence    Living Arrangements Spouse/significant other    Type of [place] Other (comment)   curb step up   [place] None    Additional Comments at times pt will go down the front full flight of stairs, uses rail      Prior Function   Level of [place] Retired    Leisure working out; likes to [employer].  Not driving      Cognition   Overall Cognitive Status Impaired/Different from baseline      Observation/Other Assessments   Focus on Therapeutic Outcomes (FOTO)  N/A not  an acute CVA      Sensation   Light Touch Appears Intact      Coordination   Gross Motor Movements are Fluid and Coordinated Yes    Finger Nose Finger Test [facility] but slow    Heel Shin Test [facility] but slow      ROM / Strength   AROM / PROM / Strength Strength      Strength   Overall Strength Deficits    Overall Strength Comments 3+/5 hip flexion, 4-/5 knee extension and flexion, 4/5 ankle DF.  Reports fatigue/tremors in lower leg      Ambulation/Gait   Ambulation/Gait Yes    Ambulation/Gait Assistance 6: Modified independent (Device/Increase time)    Ambulation Distance (Feet) 230 Feet    Assistive device None    Gait Pattern Step-through pattern;Decreased hip/knee flexion - right;Decreased hip/knee flexion - left    Ambulation Surface Level;Indoor    Stairs Yes    Stairs Assistance 5: Supervision    Stairs Assistance Details (indicate cue type and reason) when ascending stairs foot does not fully clear step; heel hanging off when ascending.  Caught heel on step once  when descending    Stair Management Technique Two rails;Alternating pattern;Forwards    Number of Stairs 8    Height of Stairs 6      Standardized Balance Assessment   Standardized Balance Assessment Five Times Sit to Stand;10 meter walk test    Five times sit to stand comments  30 second sit to stand: 13 repetitions without use of UE.    10 Meter Walk 10.43 seconds or 3.14 ft/sec      Functional Gait  Assessment   Gait assessed  Yes    Gait Level Surface Walks 20 ft in less than 7 sec but greater than 5.5 sec, uses assistive device, slower speed, mild gait deviations, or deviates 6-10 in outside of the 12 in walkway width.    Change in Gait Speed Makes only minor adjustments to walking speed, or accomplishes a change in speed with significant gait deviations, deviates 10-15 in outside the 12 in walkway width, or changes speed but loses balance but is able to recover and continue walking.    Gait with Horizontal Head Turns Performs head turns smoothly with no change in gait. Deviates no more than 6 in outside 12 in walkway width    Gait with Vertical Head Turns Performs head turns with no change in gait. Deviates no more than 6 in outside 12 in walkway width.    Gait and Pivot Turn Pivot turns safely within 3 sec and stops quickly with no loss of balance.    Step Over Obstacle Is able to step over one shoe box (4.5 in total height) but must slow down and adjust steps to clear box safely. May require verbal cueing.    Gait with Narrow Base of Support Ambulates less than 4 steps heel to toe or cannot perform without assistance.    Gait with Eyes Closed Walks 20 ft, slow speed, abnormal gait pattern, evidence for imbalance, deviates 10-15 in outside 12 in walkway width. Requires more than 9 sec to ambulate 20 ft.    Ambulating Backwards Walks 20 ft, uses assistive device, slower speed, mild gait deviations, deviates 6-10 in outside 12 in walkway width.    Steps Alternating feet, must use rail.    feet don't fully clear the stairs   Total Score 18    FGA comment: 18/30  Objective measurements completed on examination: See above findings.               PT Education - 09/21/20 2122    Education Details clinical findings, PT POC and goals, Speech therapy referral if determined appropriate by neurology    Person(s) Educated Patient;Spouse    Methods Explanation    Comprehension Verbalized understanding            PT Short Term Goals - 09/21/20 2147      PT SHORT TERM GOAL #1   Title Pt will initiate strengthening and balance HEP    Time 4    Period Weeks    Status New    Target Date 10/21/20      PT SHORT TERM GOAL #2   Title Pt will participate in trial use of treadmill to determine if safe to use at apartment    Time 4    Period Weeks    Status New    Target Date 10/21/20      PT SHORT TERM GOAL #3   Title Pt will increase functional LE strength and endurance to be able to perform 15 reps sit <> stand in 30 seconds    Baseline 13    Time 4    Period Weeks    Status New    Target Date 10/21/20      PT SHORT TERM GOAL #4   Title Pt will demonstrate increase in gait velocity to >/= 3.3 ft/sec    Baseline 3.1 ft/sec    Time 4    Period Weeks    Status New    Target Date 10/21/20      PT SHORT TERM GOAL #5   Title Pt will improve FGA to >/= 22/30    Baseline 18/30    Time 4    Period Weeks    Status New    Target Date 10/21/20             PT Long Term Goals - 09/21/20 2151      PT LONG TERM GOAL #1   Title Pt will demonstrate independence with final HEP and will demonstrate safe use of weights for carryover to apartment gym    Time 8    Period Weeks    Status New    Target Date 11/20/20      PT LONG TERM GOAL #2   Title Pt will demonstrate ability to perform sit > stand from very low/soft surface MOD I to demonstrate greater ability to stand from low couch and low car    Time 8    Period Weeks     Status New    Target Date 11/20/20      PT LONG TERM GOAL #3   Title Pt will increase gait velocity to >/= 3.6 ft/sec    Time 8    Period Weeks    Status New    Target Date 11/20/20      PT LONG TERM GOAL #4   Title Pt will increase FGA to >/= 24/30    Time 8    Period Weeks    Status New    Target Date 11/20/20      PT LONG TERM GOAL #5   Title Pt will negotiate 12 stairs with rail, alternating sequence and will negotiate curb MOD I with improved ability to clear/advance each LE    Time 8    Period Weeks    Status New  Target Date 11/20/20                  Plan - 09/21/20 2135    Clinical Impression Statement Pt is a 80 year old male referred to Neuro OPPT for evaluation of ongoing LE weakness and falls following CVA in 2019 while pt was still living in [place].  Pt's PMH is significant for the following: short term memory loss, CHF, glaucoma, Hemiparesis of L nondominant side, shingles, HTN, HLD, and tremor. The following deficits were noted during pt's exam: bilateral LE weakness especially in hip flexors, impaired cardiopulmonary endurance, impaired standing balance, and impaired gait.  Pt's FGA score indicates pt is at increased risk for falls.  30 second sit to stand was [facility] [place] and gait velocity was [facility] for community dwelling adult but pt demonstrated impairments in ability to change walking speeds. Pt would benefit from skilled PT to address these impairments and functional limitations to maximize functional mobility independence and reduce falls risk.    Personal Factors and Comorbidities Comorbidity 3+;Fitness;Past/Current Experience;Time since onset of injury/illness/exacerbation    Comorbidities CHF, glaucoma, Hemiparesis of L nondominant side, shingles, HTN, HLD, tremor, short term memory loss    Examination-Activity Limitations Locomotion Level;Stairs    Examination-Participation Restrictions Community Activity    Stability/Clinical Decision Making Evolving/Moderate  complexity    Clinical Decision Making Moderate    Rehab Potential Good    PT Frequency 2x / week    PT Duration 8 weeks    PT Treatment/Interventions ADLs/Self Care Home Management;Aquatic Therapy;Electrical Stimulation;DME Instruction;Gait training;Stair training;Functional mobility training;Therapeutic activities;Therapeutic exercise;Balance training;Neuromuscular re-education;Patient/family education;Orthotic Fit/Training;Energy conservation    PT Next Visit Plan Initiate HEP: LE strength especially hip flexor strength, SLS balance.  Work on Agricultural engineer, Industrial/product designer, Chartered certified accountant.   Pt has apartment complex gym he would like to use - need to ensure safety due to memory impairments and CHF.  He would also like to trial use of treadmill to see if it would be safe to use at apartment complex gym.    Recommended Other Services Speech therapy for cognition    Consulted and Agree with Plan of Care Patient;Family member/caregiver    Family Member Consulted Wife           Patient will benefit from skilled therapeutic intervention in order to improve the following deficits and impairments:  Cardiopulmonary status limiting activity,Decreased balance,Decreased strength,Difficulty walking  Visit Diagnosis: Repeated falls  Muscle weakness (generalized)  Unsteadiness on feet  Difficulty in walking, not elsewhere classified     Problem List Patient Active Problem List   Diagnosis Date Noted  . History of stroke 11/27/2019  . Gait abnormality 11/27/2019   Rico Junker, PT, DPT 09/21/20    10:16 PM    [facility] [address] [place], [place], [address] Phone: [phone]   Fax:  [phone]  Name: Zhion Pevehouse MRN: [id number] Date of Birth: 09-30-1940

## 2020-09-28 ENCOUNTER — Ambulatory Visit: Payer: Medicare Other | Admitting: Physical Therapy

## 2020-09-28 ENCOUNTER — Encounter: Payer: Self-pay | Admitting: Physical Therapy

## 2020-09-28 ENCOUNTER — Other Ambulatory Visit: Payer: Self-pay

## 2020-09-28 DIAGNOSIS — R2681 Unsteadiness on feet: Secondary | ICD-10-CM

## 2020-09-28 DIAGNOSIS — R262 Difficulty in walking, not elsewhere classified: Secondary | ICD-10-CM

## 2020-09-28 DIAGNOSIS — R296 Repeated falls: Secondary | ICD-10-CM

## 2020-09-28 DIAGNOSIS — M6281 Muscle weakness (generalized): Secondary | ICD-10-CM

## 2020-09-28 NOTE — Therapy (Signed)
[facility] [address] [place], [place], [address] Phone: [phone]   Fax:  [phone]  Physical Therapy Treatment  Patient Details  Name: Eric Barrett MRN: [id number] Date of Birth: [birth year] Referring Provider (PT): [name], FNP   Encounter Date: 09/28/2020   PT End of Session - 09/28/20 1416    Visit Number 2    Number of Visits 17    Date for PT Re-Evaluation 11/20/20    Authorization Type Medicare    Progress Note Due on Visit 10    PT Start Time 1232    PT Stop Time 1317    PT Time Calculation (min) 45 min    Activity Tolerance Patient tolerated treatment well    Behavior During Therapy [facility] for tasks assessed/performed           Past Medical History:  Diagnosis Date  . CHF (congestive heart failure) ([place])   . Glaucoma   . Hemiparesis of left nondominant side ([place])   . History of shingles   . HLD (hyperlipidemia)   . Hypertension   . Stroke ([place])   . Tremor     Past Surgical History:  Procedure Laterality Date  . No past surgery      There were no vitals filed for this visit.   Subjective Assessment - 09/28/20 1234    Subjective Nothing new since he was last here. Has difficulty with curbs - reports it his difficult with judgement on how high to pick up foot. Wife reports pt has difficulty getting in and out of the car and lifting his leg up.    Patient is accompained by: Family member    Pertinent History CHF    Patient Stated Goals To improve balance and decrease falls; wife would like to address memory impairments    Currently in Pain? Yes    Pain Score 7    after pt had a fall - when stepping down   Pain Location Knee    Pain Orientation Right;Left    Pain Descriptors / Indicators Aching;Sore    Pain Type Acute pain    Aggravating Factors  nothing really, sitting too long    Pain Relieving Factors voltaran gel                         Access Code:  [email or web address] URL: [email or web address] Date: 09/28/2020 Prepared by: Janann August  Initiated HEP - see MedBridge for more details.   Exercises Standing Marching - 1-2 x daily - 4-5 x weekly - 3 sets - down and back at countertop, cues for full ROM and to perform slowly.  Tandem Walking with Counter Support - 1-2 x daily - 4-5 x weekly - 3 sets Alternating Step Taps with Counter Support - 1-2 x daily - 4-5 x weekly - 2 sets - 10 reps - cues for lifting up foot to gentle tap step, slowed and controlled for SLS, using countertop as needed for balance  Romberg Stance Eyes Closed on Foam Pad - 1-2 x daily - 4-5 x weekly - 3 sets - 30 hold  Plus seated PWR Step (stepping out and out and then in and in) to help with getting in and out of the car x10 reps B - with cues for incr hip flexion and larger motion when stepping.      [facility] Adult PT Treatment/Exercise - 09/28/20 0001      Ambulation/Gait   Curb 5:  Supervision    Curb Details (indicate cue type and reason) x6 reps, pt initially performing ascending and descending with stronger RLE, cued pt to step down with weaker LLE first, pt reporting improvement in technique - educated pt's spouse on proper cues too, cues to focus on RLE foot placement      Exercises   Exercises Knee/Hip      Knee/Hip Exercises: Standing   Forward Step Up Both;1 set;10 reps;Hand Hold: 0;Step Height: 6"    Forward Step Up Limitations leading with each leg, cues for focusing on lifting up foot on step, needing intermittent cues on which foot to step down                  PT Education - 09/28/20 1415    Education Details initial HEP, proper sequencing for curb training    Person(s) Educated Patient;Spouse    Methods Explanation;Demonstration;Handout    Comprehension Verbalized understanding;Returned demonstration            PT Short Term Goals - 09/21/20 2147      PT SHORT TERM GOAL #1   Title Pt will initiate strengthening and  balance HEP    Time 4    Period Weeks    Status New    Target Date 10/21/20      PT SHORT TERM GOAL #2   Title Pt will participate in trial use of treadmill to determine if safe to use at apartment    Time 4    Period Weeks    Status New    Target Date 10/21/20      PT SHORT TERM GOAL #3   Title Pt will increase functional LE strength and endurance to be able to perform 15 reps sit <> stand in 30 seconds    Baseline 13    Time 4    Period Weeks    Status New    Target Date 10/21/20      PT SHORT TERM GOAL #4   Title Pt will demonstrate increase in gait velocity to >/= 3.3 ft/sec    Baseline 3.1 ft/sec    Time 4    Period Weeks    Status New    Target Date 10/21/20      PT SHORT TERM GOAL #5   Title Pt will improve FGA to >/= 22/30    Baseline 18/30    Time 4    Period Weeks    Status New    Target Date 10/21/20             PT Long Term Goals - 09/21/20 2151      PT LONG TERM GOAL #1   Title Pt will demonstrate independence with final HEP and will demonstrate safe use of weights for carryover to apartment gym    Time 8    Period Weeks    Status New    Target Date 11/20/20      PT LONG TERM GOAL #2   Title Pt will demonstrate ability to perform sit > stand from very low/soft surface MOD I to demonstrate greater ability to stand from low couch and low car    Time 8    Period Weeks    Status New    Target Date 11/20/20      PT LONG TERM GOAL #3   Title Pt will increase gait velocity to >/= 3.6 ft/sec    Time 8    Period Weeks    Status New  Target Date 11/20/20      PT LONG TERM GOAL #4   Title Pt will increase FGA to >/= 24/30    Time 8    Period Weeks    Status New    Target Date 11/20/20      PT LONG TERM GOAL #5   Title Pt will negotiate 12 stairs with rail, alternating sequence and will negotiate curb MOD I with improved ability to clear/advance each LE    Time 8    Period Weeks    Status New    Target Date 11/20/20                  Plan - 09/28/20 1425    Clinical Impression Statement Focus of today's skilled session was initiating HEP for hip flexor strength and balance. Pt tolerated well, needing cues for incr hip flexor ROM during standing marching and seated stepping. Performed curb training with pt needing cues for proper sequencing. Will continue to progress towards LTGs.    Personal Factors and Comorbidities Comorbidity 3+;Fitness;Past/Current Experience;Time since onset of injury/illness/exacerbation    Comorbidities CHF, glaucoma, Hemiparesis of L nondominant side, shingles, HTN, HLD, tremor, short term memory loss    Examination-Activity Limitations Locomotion Level;Stairs    Examination-Participation Restrictions Community Activity    Stability/Clinical Decision Making Evolving/Moderate complexity    Rehab Potential Good    PT Frequency 2x / week    PT Duration 8 weeks    PT Treatment/Interventions ADLs/Self Care Home Management;Aquatic Therapy;Electrical Stimulation;DME Instruction;Gait training;Stair training;Functional mobility training;Therapeutic activities;Therapeutic exercise;Balance training;Neuromuscular re-education;Patient/family education;Orthotic Fit/Training;Energy conservation    PT Next Visit Plan keep working on hip flexor strength.  Work on Agricultural engineer, Industrial/product designer, Chartered certified accountant.   Pt has apartment complex gym he would like to use - need to ensure safety due to memory impairments and CHF.  He would also like to trial use of treadmill to see if it would be safe to use at apartment complex gym.    PT Home Exercise Plan [email or web address]    Consulted and Agree with Plan of Care Patient;Family member/caregiver    Family Member Consulted Wife           Patient will benefit from skilled therapeutic intervention in order to improve the following deficits and impairments:  Cardiopulmonary status limiting activity,Decreased balance,Decreased strength,Difficulty  walking  Visit Diagnosis: Muscle weakness (generalized)  Repeated falls  Unsteadiness on feet  Difficulty in walking, not elsewhere classified     Problem List Patient Active Problem List   Diagnosis Date Noted  . History of stroke 11/27/2019  . Gait abnormality 11/27/2019    Arliss Journey, PT, DPT  09/28/2020, 2:26 PM  [facility] [address] [place], [place], [address] Phone: [phone]   Fax:  [phone]  Name: Eric Barrett MRN: [id number] Date of Birth: [birth year]

## 2020-09-28 NOTE — Patient Instructions (Signed)
Access Code: [email or web address] URL: [email or web address] Date: 09/28/2020 Prepared by: Janann August  Exercises Standing Marching - 1-2 x daily - 4-5 x weekly - 3 sets Tandem Walking with Counter Support - 1-2 x daily - 4-5 x weekly - 3 sets Alternating Step Taps with Counter Support - 1-2 x daily - 4-5 x weekly - 2 sets - 10 reps Romberg Stance Eyes Closed on Foam Pad - 1-2 x daily - 4-5 x weekly - 3 sets - 30 hold  Plus seated PWR Step (hip flexion out and out and then in and in) to help with getting in and out of the car.

## 2020-10-05 ENCOUNTER — Ambulatory Visit: Payer: Medicare Other | Attending: Family

## 2020-10-05 ENCOUNTER — Other Ambulatory Visit: Payer: Self-pay

## 2020-10-05 DIAGNOSIS — R296 Repeated falls: Secondary | ICD-10-CM | POA: Diagnosis present

## 2020-10-05 DIAGNOSIS — R2681 Unsteadiness on feet: Secondary | ICD-10-CM | POA: Diagnosis not present

## 2020-10-05 DIAGNOSIS — R269 Unspecified abnormalities of gait and mobility: Secondary | ICD-10-CM | POA: Insufficient documentation

## 2020-10-05 DIAGNOSIS — M6281 Muscle weakness (generalized): Secondary | ICD-10-CM | POA: Insufficient documentation

## 2020-10-05 DIAGNOSIS — R262 Difficulty in walking, not elsewhere classified: Secondary | ICD-10-CM | POA: Insufficient documentation

## 2020-10-05 NOTE — Therapy (Signed)
[facility] [address] [place], [place], [address] Phone: [phone]   Fax:  [phone]  Physical Therapy Treatment  Patient Details  Name: Eric Barrett MRN: [id number] Date of Birth: [birth year] Referring Provider (PT): [name], FNP   Encounter Date: 10/05/2020   PT End of Session - 10/05/20 1936    Visit Number 3    Number of Visits 17    Date for PT Re-Evaluation 11/20/20    Authorization Type Medicare    Progress Note Due on Visit 10    PT Start Time 1230    PT Stop Time 1315    PT Time Calculation (min) 45 min    Equipment Utilized During Treatment Gait belt    Activity Tolerance Patient tolerated treatment well    Behavior During Therapy [facility] for tasks assessed/performed           Past Medical History:  Diagnosis Date  . CHF (congestive heart failure) ([place])   . Glaucoma   . Hemiparesis of left nondominant side ([place])   . History of shingles   . HLD (hyperlipidemia)   . Hypertension   . Stroke ([place])   . Tremor     Past Surgical History:  Procedure Laterality Date  . No past surgery      There were no vitals filed for this visit.   Subjective Assessment - 10/05/20 1237    Subjective No pain, med changes or falls to report, spouse reorts patient is improving rapidly    Patient is accompained by: Family member    Pertinent History CHF    Patient Stated Goals To improve balance and decrease falls; wife would like to address memory impairments                             [facility] Adult PT Treatment/Exercise - 10/05/20 0001      Ambulation/Gait   Ramp 5: Supervision;Other (comment)    Ramp Details (indicate cue type and reason) 10 trips fwd and bwd ascending and in reverse with supervision only    Curb 5: Supervision;Other (comment)    Curb Details (indicate cue type and reason) stepping up with LLE and down with RLE, 15x ea.      Knee/Hip Exercises: Aerobic    Nustep 8' L2      Knee/Hip Exercises: Seated   Marching Strengthening;AROM;Left;15 reps;Limitations;Other (comment)    Marching Limitations in sitting, had patient place L foot onto 4' step from medial and lateral directions then stepping over and back to simulate getting in/out of car               Balance Exercises - 10/05/20 0001      Balance Exercises: Standing   Tandem Gait Forward;5 reps;Limitations    Tandem Gait Limitations performed with green band, 5 trips down counter    Retro Gait Upper extremity support;5 reps;Limitations    Retro Gait Limitations performed at counter with intermittnt UE support    Sidestepping 5 reps;Theraband;Limitations    Theraband Level (Sidestepping) Level 3 (Green)    Sidestepping Limitations performed at counter with UE support 5 trips down counter               PT Short Term Goals - 09/21/20 2147      PT SHORT TERM GOAL #1   Title Pt will initiate strengthening and balance HEP    Time 4    Period Weeks  Status New    Target Date 10/21/20      PT SHORT TERM GOAL #2   Title Pt will participate in trial use of treadmill to determine if safe to use at apartment    Time 4    Period Weeks    Status New    Target Date 10/21/20      PT SHORT TERM GOAL #3   Title Pt will increase functional LE strength and endurance to be able to perform 15 reps sit <> stand in 30 seconds    Baseline 13    Time 4    Period Weeks    Status New    Target Date 10/21/20      PT SHORT TERM GOAL #4   Title Pt will demonstrate increase in gait velocity to >/= 3.3 ft/sec    Baseline 3.1 ft/sec    Time 4    Period Weeks    Status New    Target Date 10/21/20      PT SHORT TERM GOAL #5   Title Pt will improve FGA to >/= 22/30    Baseline 18/30    Time 4    Period Weeks    Status New    Target Date 10/21/20             PT Long Term Goals - 09/21/20 2151      PT LONG TERM GOAL #1   Title Pt will demonstrate independence with final HEP  and will demonstrate safe use of weights for carryover to apartment gym    Time 8    Period Weeks    Status New    Target Date 11/20/20      PT LONG TERM GOAL #2   Title Pt will demonstrate ability to perform sit > stand from very low/soft surface MOD I to demonstrate greater ability to stand from low couch and low car    Time 8    Period Weeks    Status New    Target Date 11/20/20      PT LONG TERM GOAL #3   Title Pt will increase gait velocity to >/= 3.6 ft/sec    Time 8    Period Weeks    Status New    Target Date 11/20/20      PT LONG TERM GOAL #4   Title Pt will increase FGA to >/= 24/30    Time 8    Period Weeks    Status New    Target Date 11/20/20      PT LONG TERM GOAL #5   Title Pt will negotiate 12 stairs with rail, alternating sequence and will negotiate curb MOD I with improved ability to clear/advance each LE    Time 8    Period Weeks    Status New    Target Date 11/20/20                 Plan - 10/05/20 1937    Clinical Impression Statement todays skilled session consisted of ramp and curb training, gait and balance training as well as LE strengthand endurance tasking as well as functional activities designed to mimic entering/exiting car.  Patient able to complete all tasks w/o relating any discofort or exhibiting any LOB.    Personal Factors and Comorbidities Comorbidity 3+;Fitness;Past/Current Experience;Time since onset of injury/illness/exacerbation    Comorbidities CHF, glaucoma, Hemiparesis of L nondominant side, shingles, HTN, HLD, tremor, short term memory loss    Examination-Activity Limitations Locomotion Level;Stairs  Examination-Participation Restrictions Community Activity    Stability/Clinical Decision Making Evolving/Moderate complexity    Rehab Potential Good    PT Frequency 2x / week    PT Duration 8 weeks    PT Treatment/Interventions ADLs/Self Care Home Management;Aquatic Therapy;Electrical Stimulation;DME Instruction;Gait  training;Stair training;Functional mobility training;Therapeutic activities;Therapeutic exercise;Balance training;Neuromuscular re-education;Patient/family education;Orthotic Fit/Training;Energy conservation    PT Next Visit Plan continue gait and balance training, LLE strengthening, review curb and ramp negotiation, update HEP as needed    Consulted and Agree with Plan of Care Patient;Family member/caregiver    Family Member Consulted Wife           Patient will benefit from skilled therapeutic intervention in order to improve the following deficits and impairments:  Cardiopulmonary status limiting activity,Decreased balance,Decreased strength,Difficulty walking  Visit Diagnosis: Unsteadiness on feet  Muscle weakness (generalized)     Problem List Patient Active Problem List   Diagnosis Date Noted  . History of stroke 11/27/2019  . Gait abnormality 11/27/2019    Lanice Shirts PT 10/05/2020, 8:20 PM  [facility] [address] [place], [place], [address] Phone: [phone]   Fax:  [phone]  Name: Eric Barrett MRN: [id number] Date of Birth: 1941-01-20

## 2020-10-05 NOTE — Patient Instructions (Signed)
Access Code: [email or web address] URL: [email or web address] Date: 10/05/2020 Prepared by: Sharlynn Oliphant  Exercises Side Stepping with Resistance at Thighs - 2 x daily - 7 x weekly - 3 sets - 10 reps Backwards Walking - 2 x daily - 7 x weekly - 3 sets - 10 reps

## 2020-10-08 ENCOUNTER — Ambulatory Visit: Payer: Medicare Other | Admitting: Physical Therapy

## 2020-10-08 ENCOUNTER — Other Ambulatory Visit: Payer: Self-pay

## 2020-10-08 DIAGNOSIS — R2681 Unsteadiness on feet: Secondary | ICD-10-CM | POA: Diagnosis not present

## 2020-10-08 DIAGNOSIS — M6281 Muscle weakness (generalized): Secondary | ICD-10-CM

## 2020-10-08 NOTE — Therapy (Signed)
[facility] [address] [place], [place], [address] Phone: [phone]   Fax:  [phone]  Physical Therapy Treatment  Patient Details  Name: Eric Barrett MRN: [id number] Date of Birth: 02-06-1941 Referring Provider (PT): [name], FNP   Encounter Date: 10/08/2020   PT End of Session - 10/08/20 1505    Visit Number 4    Number of Visits 17    Date for PT Re-Evaluation 11/20/20    Authorization Type Medicare    Progress Note Due on Visit 10    PT Start Time 1410    PT Stop Time 1450    PT Time Calculation (min) 40 min    Equipment Utilized During Treatment --    Activity Tolerance Patient tolerated treatment well    Behavior During Therapy [facility] for tasks assessed/performed           Past Medical History:  Diagnosis Date  . CHF (congestive heart failure) ([place])   . Glaucoma   . Hemiparesis of left nondominant side ([place])   . History of shingles   . HLD (hyperlipidemia)   . Hypertension   . Stroke ([place])   . Tremor     Past Surgical History:  Procedure Laterality Date  . No past surgery      There were no vitals filed for this visit.   Subjective Assessment - 10/08/20 1413    Subjective No restirctions with getting in and out of the cab.  Reports he still would like to learn to use the treadmill to be independent at the gym.  Has not attempted stair negotiation at home.    Patient is accompained by: Family member    Pertinent History CHF    Patient Stated Goals To improve balance and decrease falls; wife would like to address memory impairments    Currently in Pain? Yes    Pain Location Knee    Pain Orientation Left    Pain Descriptors / Indicators Discomfort              [facility] PT Assessment - 10/08/20 1417      6 Minute Walk- Baseline   6 Minute Walk- Baseline --    BP (mmHg) 149/79    HR (bpm) 67    02 Sat (%RA) 97 %    Modified Borg Scale for Dyspnea 0- Nothing at all      6  Minute walk- Post Test   6 Minute Walk Post Test yes    BP (mmHg) 169/84 (P)     HR (bpm) 82 (P)     02 Sat (%RA) 97 % (P)     Modified Borg Scale for Dyspnea 0- Nothing at all (P)       6 minute walk test results    Aerobic Endurance Distance Walked 686 (P)    perfromed assessment on treadmill              10/08/20 0001  Knee/Hip Exercises: Aerobic  Tread Mill Allowed pt to attempt to problem solve set up of treadmill and increasing speed.  Incline at 0%, speed increased to 1.6 mph gradually.  Pt performed x 6 minutes with bilat UE support                    Balance Exercises - 10/08/20 1457      Balance Exercises: Standing   SLS with Vectors Solid surface;30 secs;Cognitive challenge;Other (comment)   touches based on color therapist  called out; toe touches, heel touches, double touches, double colors   Other Standing Exercises Walking forward with lateral touch; walking backward with lateral touch; walking over obstacles with emphasis on high hip flexion             PT Education - 10/08/20 1504    Education Details treadmill training and safety, POC    Person(s) Educated Patient    Methods Explanation    Comprehension Verbalized understanding            PT Short Term Goals - 09/21/20 2147      PT SHORT TERM GOAL #1   Title Pt will initiate strengthening and balance HEP    Time 4    Period Weeks    Status New    Target Date 10/21/20      PT SHORT TERM GOAL #2   Title Pt will participate in trial use of treadmill to determine if safe to use at apartment    Time 4    Period Weeks    Status New    Target Date 10/21/20      PT SHORT TERM GOAL #3   Title Pt will increase functional LE strength and endurance to be able to perform 15 reps sit <> stand in 30 seconds    Baseline 13    Time 4    Period Weeks    Status New    Target Date 10/21/20      PT SHORT TERM GOAL #4   Title Pt will demonstrate increase in gait velocity to >/= 3.3 ft/sec     Baseline 3.1 ft/sec    Time 4    Period Weeks    Status New    Target Date 10/21/20      PT SHORT TERM GOAL #5   Title Pt will improve FGA to >/= 22/30    Baseline 18/30    Time 4    Period Weeks    Status New    Target Date 10/21/20             PT Long Term Goals - 09/21/20 2151      PT LONG TERM GOAL #1   Title Pt will demonstrate independence with final HEP and will demonstrate safe use of weights for carryover to apartment gym    Time 8    Period Weeks    Status New    Target Date 11/20/20      PT LONG TERM GOAL #2   Title Pt will demonstrate ability to perform sit > stand from very low/soft surface MOD I to demonstrate greater ability to stand from low couch and low car    Time 8    Period Weeks    Status New    Target Date 11/20/20      PT LONG TERM GOAL #3   Title Pt will increase gait velocity to >/= 3.6 ft/sec    Time 8    Period Weeks    Status New    Target Date 11/20/20      PT LONG TERM GOAL #4   Title Pt will increase FGA to >/= 24/30    Time 8    Period Weeks    Status New    Target Date 11/20/20      PT LONG TERM GOAL #5   Title Pt will negotiate 12 stairs with rail, alternating sequence and will negotiate curb MOD I with improved ability to clear/advance each LE  Time 8    Period Weeks    Status New    Target Date 11/20/20                 Plan - 10/08/20 1507    Clinical Impression Statement Performed a 57min walk test on the treadmill to evaluated patient's safety and independence on a treadmill so he may start using the treadmill at his gym.  Overall judgment and safety appeared intact but further assessment with a longer duration monitoring vitals is necessary.  Decreased step length, decreased hip ext, and porr foot clearance all noted during gait.  Pt tolerated dynamic stability exercises well with contact guard.  No LOB noted but pt needed cueing to stabilize between repetitions.  More challenging cognitive tasks reuqired  additional verbal and visual cueing to complete.    Personal Factors and Comorbidities Comorbidity 3+;Fitness;Past/Current Experience;Time since onset of injury/illness/exacerbation    Comorbidities CHF, glaucoma, Hemiparesis of L nondominant side, shingles, HTN, HLD, tremor, short term memory loss    Examination-Activity Limitations Locomotion Level;Stairs    Examination-Participation Restrictions Community Activity    Stability/Clinical Decision Making Evolving/Moderate complexity    Rehab Potential Good    PT Frequency 2x / week    PT Duration 8 weeks    PT Treatment/Interventions ADLs/Self Care Home Management;Aquatic Therapy;Electrical Stimulation;DME Instruction;Gait training;Stair training;Functional mobility training;Therapeutic activities;Therapeutic exercise;Balance training;Neuromuscular re-education;Patient/family education;Orthotic Fit/Training;Energy conservation    PT Next Visit Plan continue gait and balance training, LLE strengthening, review treadmill safety, update HEP as needed, SLS activities    PT Home Exercise Plan [id number]    Consulted and Agree with Plan of Care Patient;Family member/caregiver    Family Member Consulted Wife           Patient will benefit from skilled therapeutic intervention in order to improve the following deficits and impairments:  Cardiopulmonary status limiting activity,Decreased balance,Decreased strength,Difficulty walking  Visit Diagnosis: Unsteadiness on feet  Muscle weakness (generalized)     Problem List Patient Active Problem List   Diagnosis Date Noted  . History of stroke 11/27/2019  . Gait abnormality 11/27/2019    Yetta Numbers 10/08/2020, 4:14 PM  [facility] [address] [place], [place], [address] Phone: [phone]   Fax:  [phone]  Name: Willmar Stockinger MRN: [id number] Date of Birth: 01-09-1941

## 2020-10-12 ENCOUNTER — Other Ambulatory Visit: Payer: Self-pay

## 2020-10-12 ENCOUNTER — Ambulatory Visit: Payer: Medicare Other

## 2020-10-12 VITALS — BP 144/84 | HR 84

## 2020-10-12 DIAGNOSIS — M6281 Muscle weakness (generalized): Secondary | ICD-10-CM

## 2020-10-12 DIAGNOSIS — R269 Unspecified abnormalities of gait and mobility: Secondary | ICD-10-CM

## 2020-10-12 DIAGNOSIS — R2681 Unsteadiness on feet: Secondary | ICD-10-CM | POA: Diagnosis not present

## 2020-10-12 NOTE — Therapy (Signed)
[facility] [address] [place], [place], [address] Phone: [phone]   Fax:  [phone]  Physical Therapy Treatment  Patient Details  Name: Eric Barrett MRN: [id number] Date of Birth: 06-Oct-1940 Referring Provider (PT): [name], FNP   Encounter Date: 10/12/2020   PT End of Session - 10/12/20 1448    Visit Number 4    Number of Visits 17    Date for PT Re-Evaluation 11/20/20    Authorization Type Medicare    Progress Note Due on Visit 10    PT Start Time 1230    PT Stop Time 1315    PT Time Calculation (min) 45 min    Equipment Utilized During Treatment Gait belt    Activity Tolerance Patient tolerated treatment well    Behavior During Therapy [facility] for tasks assessed/performed           Past Medical History:  Diagnosis Date  . CHF (congestive heart failure) ([place])   . Glaucoma   . Hemiparesis of left nondominant side ([place])   . History of shingles   . HLD (hyperlipidemia)   . Hypertension   . Stroke ([place])   . Tremor     Past Surgical History:  Procedure Laterality Date  . No past surgery      Vitals:   10/12/20 1445  BP: (!) 144/84  Pulse: 84  SpO2: 97%     Subjective Assessment - 10/12/20 1445    Subjective No issues with following treadmill last session but requested to review operation one more time, wishes to return to gym in apartment complex ASAP, no pain or med changes to note    Patient is accompained by: Family member    Pertinent History CHF    Limitations Walking    Patient Stated Goals To improve balance and decrease falls; wife would like to address memory impairments    Currently in Pain? No/denies    Pain Score 0-No pain                             [facility] Adult PT Treatment/Exercise - 10/12/20 0001      Ambulation/Gait   Ambulation/Gait Yes    Ambulation/Gait Assistance 5: Supervision    Ambulation/Gait Assistance Details 163ft CW/CCW while self  tossing/catching ball    Ambulation Distance (Feet) 230 Feet    Assistive device None    Gait Pattern Decreased step length - right;Decreased step length - left    Ambulation Surface Level;Indoor      Knee/Hip Exercises: Aerobic   Tread Mill 8' at 1.22mph, 0% incline, gradual increase in speed self determined, VS taken post activity               Balance Exercises - 10/12/20 0001      Balance Exercises: Standing   Standing Eyes Opened Narrow base of support (BOS);Foam/compliant surface;1 rep;30 secs;Limitations    Standing Eyes Opened Limitations 30s x1 follwed by head turns/nods    Standing Eyes Closed Narrow base of support (BOS);Head turns;Foam/compliant surface;1 rep;30 secs;Limitations    Standing Eyes Closed Limitations 30s x1 followed by head turns/nods 10x ea.    Tandem Stance Eyes open;Eyes closed;Foam/compliant surface;Intermittent upper extremity support;1 rep;30 secs;Limitations    Tandem Stance Time 30s each position    SLS with Vectors Solid surface;Intermittent upper extremity assist;2 reps;30 secs;Limitations    SLS with Vectors Limitations in // bars, SLS with soccer ball manipulation  30s pre LE, followed by SLS and tapping ball 15 ea. LE    Retro Gait Upper extremity support;5 reps;Limitations    Retro Gait Limitations performed at counter    Sidestepping Upper extremity support;5 reps;Limitations    Sidestepping Limitations performed at counter encouraging small steps             PT Education - 10/12/20 1448    Education Details Patient and spouse instructed in increasing step length when ambulating    Person(s) Educated Patient;Spouse    Methods Explanation;Demonstration    Comprehension Verbalized understanding;Need further instruction            PT Short Term Goals - 09/21/20 2147      PT SHORT TERM GOAL #1   Title Pt will initiate strengthening and balance HEP    Time 4    Period Weeks    Status New    Target Date 10/21/20      PT SHORT  TERM GOAL #2   Title Pt will participate in trial use of treadmill to determine if safe to use at apartment    Time 4    Period Weeks    Status New    Target Date 10/21/20      PT SHORT TERM GOAL #3   Title Pt will increase functional LE strength and endurance to be able to perform 15 reps sit <> stand in 30 seconds    Baseline 13    Time 4    Period Weeks    Status New    Target Date 10/21/20      PT SHORT TERM GOAL #4   Title Pt will demonstrate increase in gait velocity to >/= 3.3 ft/sec    Baseline 3.1 ft/sec    Time 4    Period Weeks    Status New    Target Date 10/21/20      PT SHORT TERM GOAL #5   Title Pt will improve FGA to >/= 22/30    Baseline 18/30    Time 4    Period Weeks    Status New    Target Date 10/21/20             PT Long Term Goals - 09/21/20 2151      PT LONG TERM GOAL #1   Title Pt will demonstrate independence with final HEP and will demonstrate safe use of weights for carryover to apartment gym    Time 8    Period Weeks    Status New    Target Date 11/20/20      PT LONG TERM GOAL #2   Title Pt will demonstrate ability to perform sit > stand from very low/soft surface MOD I to demonstrate greater ability to stand from low couch and low car    Time 8    Period Weeks    Status New    Target Date 11/20/20      PT LONG TERM GOAL #3   Title Pt will increase gait velocity to >/= 3.6 ft/sec    Time 8    Period Weeks    Status New    Target Date 11/20/20      PT LONG TERM GOAL #4   Title Pt will increase FGA to >/= 24/30    Time 8    Period Weeks    Status New    Target Date 11/20/20      PT LONG TERM GOAL #5   Title Pt will negotiate  12 stairs with rail, alternating sequence and will negotiate curb MOD I with improved ability to clear/advance each LE    Time 8    Period Weeks    Status New    Target Date 11/20/20                 Plan - 10/12/20 1449    Clinical Impression Statement Focus of todays skilled session was  gait and balance training, use of treadmill and actvities to improve SLS confidence and increase step length, continues to show decreased step length as well as LLE placement deficits    Personal Factors and Comorbidities Comorbidity 3+;Fitness;Past/Current Experience;Time since onset of injury/illness/exacerbation    Comorbidities CHF, glaucoma, Hemiparesis of L nondominant side, shingles, HTN, HLD, tremor, short term memory loss    Examination-Activity Limitations Locomotion Level;Stairs    Examination-Participation Restrictions Community Activity    Stability/Clinical Decision Making Evolving/Moderate complexity    Rehab Potential Good    PT Frequency 2x / week    PT Duration 8 weeks    PT Treatment/Interventions ADLs/Self Care Home Management;Aquatic Therapy;Electrical Stimulation;DME Instruction;Gait training;Stair training;Functional mobility training;Therapeutic activities;Therapeutic exercise;Balance training;Neuromuscular re-education;Patient/family education;Orthotic Fit/Training;Energy conservation    PT Next Visit Plan continue gait and balance training, LLE strengthening, review treadmill safety, update HEP as needed, SLS activities    PT Home Exercise Plan [email or web address]    Consulted and Agree with Plan of Care Patient;Family member/caregiver    Family Member Consulted Wife           Patient will benefit from skilled therapeutic intervention in order to improve the following deficits and impairments:  Cardiopulmonary status limiting activity,Decreased balance,Decreased strength,Difficulty walking  Visit Diagnosis: Unsteadiness on feet  Gait abnormality  Muscle weakness (generalized)     Problem List Patient Active Problem List   Diagnosis Date Noted  . History of stroke 11/27/2019  . Gait abnormality 11/27/2019    Lanice Shirts PT 10/12/2020, 3:07 PM  [facility] [address] [place], [place],  [address] Phone: [phone]   Fax:  [phone]  Name: Zavion Sleight MRN: [id number] Date of Birth: 1941/07/03

## 2020-10-12 NOTE — Patient Instructions (Signed)
Patient and spouse instructed in increasing step length when ambulating

## 2020-10-19 ENCOUNTER — Ambulatory Visit: Payer: Medicare Other | Admitting: Physical Therapy

## 2020-10-19 ENCOUNTER — Other Ambulatory Visit: Payer: Self-pay

## 2020-10-19 ENCOUNTER — Encounter: Payer: Self-pay | Admitting: Physical Therapy

## 2020-10-19 DIAGNOSIS — R262 Difficulty in walking, not elsewhere classified: Secondary | ICD-10-CM

## 2020-10-19 DIAGNOSIS — R2681 Unsteadiness on feet: Secondary | ICD-10-CM | POA: Diagnosis not present

## 2020-10-19 DIAGNOSIS — M6281 Muscle weakness (generalized): Secondary | ICD-10-CM

## 2020-10-19 DIAGNOSIS — R269 Unspecified abnormalities of gait and mobility: Secondary | ICD-10-CM

## 2020-10-19 NOTE — Therapy (Signed)
[facility] [address] [place], [place], [address] Phone: [phone]   Fax:  [phone]  Physical Therapy Treatment  Patient Details  Name: Eric Barrett MRN: [id number] Date of Birth: Feb 19, 1941 Referring Provider (PT): [name], FNP   Encounter Date: 10/19/2020   PT End of Session - 10/19/20 1237    Visit Number 6   3/15- number updated due to mis-numbered last session   Number of Visits 17    Date for PT Re-Evaluation 11/20/20    Authorization Type Medicare    Progress Note Due on Visit 10    PT Start Time 1233    PT Stop Time 1313    PT Time Calculation (min) 40 min    Equipment Utilized During Treatment Gait belt    Activity Tolerance Patient tolerated treatment well    Behavior During Therapy WFL for tasks assessed/performed           Past Medical History:  Diagnosis Date  . CHF (congestive heart failure) ([place])   . Glaucoma   . Hemiparesis of left nondominant side ([place])   . History of shingles   . HLD (hyperlipidemia)   . Hypertension   . Stroke ([place])   . Tremor     Past Surgical History:  Procedure Laterality Date  . No past surgery      There were no vitals filed for this visit.   Subjective Assessment - 10/19/20 1237    Subjective No new complaints. Has used the treadmill at home with no issues.    Patient is accompained by: Family member    Pertinent History CHF    Limitations Walking    Patient Stated Goals To improve balance and decrease falls; wife would like to address memory impairments    Currently in Pain? No/denies    Pain Score 0-No pain              [facility] PT Assessment - 10/19/20 1244      Standardized Balance Assessment   Standardized Balance Assessment Five Times Sit to Stand;10 meter walk test    Five times sit to stand comments  30 second sit to stand: 14 reps with no UE support from standard height chair    10 Meter Walk 9.85 sec's= 3.33 ft/sec no AD                [facility] Adult PT Treatment/Exercise - 10/19/20 1244      Transfers   Transfers Sit to Stand;Stand to Sit    Sit to Stand 6: Modified independent (Device/Increase time)    Stand to Sit 6: Modified independent (Device/Increase time)      Ambulation/Gait   Ambulation/Gait Yes    Ambulation/Gait Assistance 5: Supervision;4: Min guard    Ambulation Distance (Feet) 1000 Feet   x1 in/outdoors, plus around gym with session   Assistive device None    Gait Pattern Decreased step length - right;Decreased step length - left    Ambulation Surface Level;Unlevel;Indoor;Outdoor;Paved;Grass      Neuro Re-ed    Neuro Re-ed Details  for balance/muscle re-ed: gait outdoors no AD working on scalling all directions, speed changes, sudden stops/starts, up/down grassy hills. Min guard assist for safety with no balance issues noted.      Exercises   Exercises Other Exercises    Other Exercises  reviewed HEP. Pt reports forgetting the walking marching and corner standing on pillows. Performed in session with cues to slow down with marching,  had pt add a 1-2-3 count with each marhc.               Balance Exercises - 10/19/20 1305      Balance Exercises: Standing   Rockerboard Anterior/posterior;EO;EC;10 seconds;Other reps (comment);Limitations    Rockerboard Limitations significant posterior balance loss with vision removed both with rocking the board and static holds for max of 10 sec's each time. Min to mod assist needed. with EO rocking the board pt only required min guard to min assist for balance. cues needed on posture and weight shifting throughout for balance correction.               PT Short Term Goals - 10/19/20 1247      PT SHORT TERM GOAL #1   Title Pt will initiate strengthening and balance HEP    Baseline 10/19/20: met in session today with review of program    Status Achieved    Target Date 10/21/20      PT SHORT TERM GOAL #2   Title Pt will participate in  trial use of treadmill to determine if safe to use at apartment    Baseline 10/19/20: has returned to use of treadmill at gym    Status Achieved    Target Date 10/21/20      PT SHORT TERM GOAL #3   Title Pt will increase functional LE strength and endurance to be able to perform 15 reps sit <> stand in 30 seconds    Baseline 10/19/20: 14 reps performed, improved from 13 just not to goal level    Time --    Period --    Status Partially Met    Target Date 10/21/20      PT SHORT TERM GOAL #4   Title Pt will demonstrate increase in gait velocity to >/= 3.3 ft/sec    Baseline 10/19/20: 3.33 ft/sec no AD    Time --    Period --    Status Achieved    Target Date 10/21/20      PT SHORT TERM GOAL #5   Title Pt will improve FGA to >/= 22/30    Baseline 18/30    Time 4    Period Weeks    Status On-going    Target Date 10/21/20             PT Long Term Goals - 09/21/20 2151      PT LONG TERM GOAL #1   Title Pt will demonstrate independence with final HEP and will demonstrate safe use of weights for carryover to apartment gym    Time 8    Period Weeks    Status New    Target Date 11/20/20      PT LONG TERM GOAL #2   Title Pt will demonstrate ability to perform sit > stand from very low/soft surface MOD I to demonstrate greater ability to stand from low couch and low car    Time 8    Period Weeks    Status New    Target Date 11/20/20      PT LONG TERM GOAL #3   Title Pt will increase gait velocity to >/= 3.6 ft/sec    Time 8    Period Weeks    Status New    Target Date 11/20/20      PT LONG TERM GOAL #4   Title Pt will increase FGA to >/= 24/30    Time 8    Period Weeks  Status New    Target Date 11/20/20      PT LONG TERM GOAL #5   Title Pt will negotiate 12 stairs with rail, alternating sequence and will negotiate curb MOD I with improved ability to clear/advance each LE    Time 8    Period Weeks    Status New    Target Date 11/20/20                  Plan - 10/19/20 1238    Clinical Impression Statement Today's skilled session began by checking progress toward STGs with pt meeting his HEP, Treadmill use at home and gait speed goals. Pt partially met the 30 second sit to stand goal as the reps increased, just not to goal level. Will check remaining goals, Functional Gait Index, at next session as gym too busy/crowded at this time. Remainder of session continued to focus on dynamic gait and balance training. Pt significantly challenged by balance board with a heavy posterior lean/balance loss when vision is removed. Will benefit from continued balance training. The pt is progressing toward goals and should benefit from continued PT to progress toward unmet goals.    Personal Factors and Comorbidities Comorbidity 3+;Fitness;Past/Current Experience;Time since onset of injury/illness/exacerbation    Comorbidities CHF, glaucoma, Hemiparesis of L nondominant side, shingles, HTN, HLD, tremor, short term memory loss    Examination-Activity Limitations Locomotion Level;Stairs    Examination-Participation Restrictions Community Activity    Stability/Clinical Decision Making Evolving/Moderate complexity    Rehab Potential Good    PT Frequency 2x / week    PT Duration 8 weeks    PT Treatment/Interventions ADLs/Self Care Home Management;Aquatic Therapy;Electrical Stimulation;DME Instruction;Gait training;Stair training;Functional mobility training;Therapeutic activities;Therapeutic exercise;Balance training;Neuromuscular re-education;Patient/family education;Orthotic Fit/Training;Energy conservation    PT Next Visit Plan check remaining STG. continue to address balance on complaint surfaces, specifcally the balance board.    PT Home Exercise Plan [email or web address]    Consulted and Agree with Plan of Care Patient;Family member/caregiver    Family Member Consulted Wife           Patient will benefit from skilled therapeutic intervention in order to improve the  following deficits and impairments:  Cardiopulmonary status limiting activity,Decreased balance,Decreased strength,Difficulty walking  Visit Diagnosis: Gait abnormality  Unsteadiness on feet  Muscle weakness (generalized)  Difficulty in walking, not elsewhere classified     Problem List Patient Active Problem List   Diagnosis Date Noted  . History of stroke 11/27/2019  . Gait abnormality 11/27/2019    Willow Ora, PTA, [facility] [address] [place], [place] [address] [phone] 10/19/20, 1:34 PM   Name: Eric Barrett MRN: [id number] Date of Birth: [birth year]

## 2020-10-19 NOTE — Patient Instructions (Signed)
Access Code: [email or web address] URL: [email or web address] Date: 10/19/2020 Prepared by: Willow Ora  Exercises Standing Marching - 1-2 x daily - 4-5 x weekly - 3 sets Tandem Walking with Counter Support - 1-2 x daily - 4-5 x weekly - 3 sets Alternating Step Taps with Counter Support - 1-2 x daily - 4-5 x weekly - 2 sets - 10 reps Romberg Stance Eyes Closed on Foam Pad - 1-2 x daily - 4-5 x weekly - 3 sets - 30 hold

## 2020-10-22 ENCOUNTER — Ambulatory Visit: Payer: Medicare Other | Admitting: Physical Therapy

## 2020-10-22 ENCOUNTER — Encounter: Payer: Self-pay | Admitting: Physical Therapy

## 2020-10-22 ENCOUNTER — Other Ambulatory Visit: Payer: Self-pay

## 2020-10-22 DIAGNOSIS — R296 Repeated falls: Secondary | ICD-10-CM

## 2020-10-22 DIAGNOSIS — R2681 Unsteadiness on feet: Secondary | ICD-10-CM

## 2020-10-22 DIAGNOSIS — R262 Difficulty in walking, not elsewhere classified: Secondary | ICD-10-CM

## 2020-10-22 DIAGNOSIS — M6281 Muscle weakness (generalized): Secondary | ICD-10-CM

## 2020-10-22 NOTE — Therapy (Signed)
[facility] [address] [place], [place], [address] Phone: [phone]   Fax:  [phone]  Physical Therapy Treatment  Patient Details  Name: Eric Barrett MRN: [id number] Date of Birth: [birth year] Referring Provider (PT): [name], FNP   Encounter Date: 10/22/2020   PT End of Session - 10/22/20 1455    Visit Number 7    Number of Visits 17    Date for PT Re-Evaluation 11/20/20    Authorization Type Medicare    Progress Note Due on Visit 10    PT Start Time 1450    PT Stop Time 1530    PT Time Calculation (min) 40 min    Equipment Utilized During Treatment Gait belt    Activity Tolerance Patient tolerated treatment well    Behavior During Therapy [facility] for tasks assessed/performed           Past Medical History:  Diagnosis Date  . CHF (congestive heart failure) ([place])   . Glaucoma   . Hemiparesis of left nondominant side ([place])   . History of shingles   . HLD (hyperlipidemia)   . Hypertension   . Stroke ([place])   . Tremor     Past Surgical History:  Procedure Laterality Date  . No past surgery      There were no vitals filed for this visit.   Subjective Assessment - 10/22/20 1453    Subjective No new complaints. No falls or pain to report. Has to leave to go out of town to [place] due to death in family. Will be here next Tuesday, after that he will need to call and get back on schedule.    Pertinent History CHF    Limitations Walking    Patient Stated Goals To improve balance and decrease falls; wife would like to address memory impairments    Currently in Pain? No/denies    Pain Score 0-No pain              [facility] PT Assessment - 10/22/20 1455      Functional Gait  Assessment   Gait assessed  Yes    Gait Level Surface Walks 20 ft in less than 7 sec but greater than 5.5 sec, uses assistive device, slower speed, mild gait deviations, or deviates 6-10 in outside of the 12 in walkway width.    7.16 sec's   Change in Gait Speed Able to smoothly change walking speed without loss of balance or gait deviation. Deviate no more than 6 in outside of the 12 in walkway width.    Gait with Horizontal Head Turns Performs head turns smoothly with no change in gait. Deviates no more than 6 in outside 12 in walkway width    Gait with Vertical Head Turns Performs head turns with no change in gait. Deviates no more than 6 in outside 12 in walkway width.    Gait and Pivot Turn Pivot turns safely within 3 sec and stops quickly with no loss of balance.    Step Over Obstacle Is able to step over 2 stacked shoe boxes taped together (9 in total height) without changing gait speed. No evidence of imbalance.    Gait with Narrow Base of Support Ambulates less than 4 steps heel to toe or cannot perform without assistance.   pt places feet in modified tandem, unable to get true heel to toe pattern, no imbalance int his position   Gait with Eyes Closed Walks 20 ft, slow speed,  abnormal gait pattern, evidence for imbalance, deviates 10-15 in outside 12 in walkway width. Requires more than 9 sec to ambulate 20 ft.   12.84 sec's   Ambulating Backwards Walks 20 ft, slow speed, abnormal gait pattern, evidence for imbalance, deviates 10-15 in outside 12 in walkway width.   >25 sec's, no deviations or imbalance   Steps Alternating feet, must use rail.    Total Score 21    FGA comment: 21/30 medium fall risk category                 [facility] Adult PT Treatment/Exercise - 10/22/20 1505      Transfers   Transfers Sit to Stand;Stand to Sit    Sit to Stand 6: Modified independent (Device/Increase time)    Stand to Sit 6: Modified independent (Device/Increase time)      Ambulation/Gait   Ambulation/Gait Yes    Ambulation/Gait Assistance 5: Supervision    Ambulation/Gait Assistance Details around gym with session. Min guard for safety with FGA.    Assistive device None    Gait Pattern Decreased step length -  right;Decreased step length - left    Ambulation Surface Level;Indoor      Neuro Re-ed    Neuro Re-ed Details  for balance/muscle re-ed: blue mat over ramp- performed both ways on ramp with feet together for EC 30 sec's x 3 reps, progressing to EC head movements left<>right, up<>down for 10 reps. min guard to min assist for balance.               Balance Exercises - 10/22/20 1514      Balance Exercises: Standing   Rockerboard Anterior/posterior;Lateral;EO;EC;30 seconds;Other reps (comment);Limitations    Rockerboard Limitations performed both ways on balance board with no UE support: rocking the board with empahsis on tall posture with EO, progressing to EC. then holding the board steady for EC 30 sec's x 3 reps. increased assist needed with lateral direction (mod/max assist to correct balance) vs min/mod assist with ant/posterior direction.               PT Short Term Goals - 10/22/20 1626      PT SHORT TERM GOAL #1   Title Pt will initiate strengthening and balance HEP    Baseline 10/19/20: met in session today with review of program    Status Achieved      PT SHORT TERM GOAL #2   Title Pt will participate in trial use of treadmill to determine if safe to use at apartment    Baseline 10/19/20: has returned to use of treadmill at gym    Status Achieved      PT [place] #3   Title Pt will increase functional LE strength and endurance to be able to perform 15 reps sit <> stand in 30 seconds    Baseline 10/19/20: 14 reps performed, improved from 13 just not to goal level    Status Partially Met      PT SHORT TERM GOAL #4   Title Pt will demonstrate increase in gait velocity to >/= 3.3 ft/sec    Baseline 10/19/20: 3.33 ft/sec no AD    Status Achieved      PT SHORT TERM GOAL #5   Title Pt will improve FGA to >/= 22/30    Baseline 10/22/20: 21/30 scored this session, improved from 18/30 just not to goal    Status Partially Met             PT  Long Term Goals -  09/21/20 2151      PT LONG TERM GOAL #1   Title Pt will demonstrate independence with final HEP and will demonstrate safe use of weights for carryover to apartment gym    Time 8    Period Weeks    Status New    Target Date 11/20/20      PT LONG TERM GOAL #2   Title Pt will demonstrate ability to perform sit > stand from very low/soft surface MOD I to demonstrate greater ability to stand from low couch and low car    Time 8    Period Weeks    Status New    Target Date 11/20/20      PT LONG TERM GOAL #3   Title Pt will increase gait velocity to >/= 3.6 ft/sec    Time 8    Period Weeks    Status New    Target Date 11/20/20      PT LONG TERM GOAL #4   Title Pt will increase FGA to >/= 24/30    Time 8    Period Weeks    Status New    Target Date 11/20/20      PT LONG TERM GOAL #5   Title Pt will negotiate 12 stairs with rail, alternating sequence and will negotiate curb MOD I with improved ability to clear/advance each LE    Time 8    Period Weeks    Status New    Target Date 11/20/20                 Plan - 10/22/20 1455    Clinical Impression Statement Today's skilled session continued to focus on dynamic gait with pt scoring 21/30 on Functional Gait Index this session. Remainder of session focused on balance training on compliant surfaces with increased assistance needed on balance board vs the incline with compliant mat over it. Pt is progressing toward goals and should benefit from continued PT to progress toward unmet goals.    Personal Factors and Comorbidities Comorbidity 3+;Fitness;Past/Current Experience;Time since onset of injury/illness/exacerbation    Comorbidities CHF, glaucoma, Hemiparesis of L nondominant side, shingles, HTN, HLD, tremor, short term memory loss    Examination-Activity Limitations Locomotion Level;Stairs    Examination-Participation Restrictions Community Activity    Stability/Clinical Decision Making Evolving/Moderate complexity    Rehab  Potential Good    PT Frequency 2x / week    PT Duration 8 weeks    PT Treatment/Interventions ADLs/Self Care Home Management;Aquatic Therapy;Electrical Stimulation;DME Instruction;Gait training;Stair training;Functional mobility training;Therapeutic activities;Therapeutic exercise;Balance training;Neuromuscular re-education;Patient/family education;Orthotic Fit/Training;Energy conservation    PT Next Visit Plan continue to address balance on complaint surfaces, specifcally the balance board.    PT Home Exercise Plan [email or web address]    Consulted and Agree with Plan of Care Patient;Family member/caregiver    Family Member Consulted Wife           Patient will benefit from skilled therapeutic intervention in order to improve the following deficits and impairments:  Cardiopulmonary status limiting activity,Decreased balance,Decreased strength,Difficulty walking  Visit Diagnosis: Unsteadiness on feet  Muscle weakness (generalized)  Difficulty in walking, not elsewhere classified  Repeated falls     Problem List Patient Active Problem List   Diagnosis Date Noted  . History of stroke 11/27/2019  . Gait abnormality 11/27/2019    Willow Ora, PTA, [facility] [address], [place], [place] [address] [phone] 10/22/20, 4:27 PM   Name: Immanuel Fedak  MRN: [id number] Date of Birth: Dec 11, 1940

## 2020-10-26 ENCOUNTER — Other Ambulatory Visit: Payer: Self-pay

## 2020-10-26 ENCOUNTER — Encounter: Payer: Self-pay | Admitting: Physical Therapy

## 2020-10-26 ENCOUNTER — Ambulatory Visit: Payer: Medicare Other | Admitting: Physical Therapy

## 2020-10-26 DIAGNOSIS — R296 Repeated falls: Secondary | ICD-10-CM

## 2020-10-26 DIAGNOSIS — R2681 Unsteadiness on feet: Secondary | ICD-10-CM

## 2020-10-26 DIAGNOSIS — M6281 Muscle weakness (generalized): Secondary | ICD-10-CM

## 2020-10-26 DIAGNOSIS — R262 Difficulty in walking, not elsewhere classified: Secondary | ICD-10-CM

## 2020-10-26 NOTE — Therapy (Signed)
[facility] [address] [place], [place], [address] Phone: [phone]   Fax:  [phone]  Physical Therapy Treatment  Patient Details  Name: Eric Barrett MRN: [id number] Date of Birth: [birth year] Referring Provider (PT): [name], FNP   Encounter Date: 10/26/2020   PT End of Session - 10/26/20 1234    Visit Number 8    Number of Visits 17    Date for PT Re-Evaluation 11/20/20    Authorization Type Medicare    Progress Note Due on Visit 10    PT Start Time 1231    PT Stop Time 1311    PT Time Calculation (min) 40 min    Equipment Utilized During Treatment Gait belt    Activity Tolerance Patient tolerated treatment well    Behavior During Therapy [facility] for tasks assessed/performed           Past Medical History:  Diagnosis Date  . CHF (congestive heart failure) ([place])   . Glaucoma   . Hemiparesis of left nondominant side ([place])   . History of shingles   . HLD (hyperlipidemia)   . Hypertension   . Stroke ([place])   . Tremor     Past Surgical History:  Procedure Laterality Date  . No past surgery      There were no vitals filed for this visit.   Subjective Assessment - 10/26/20 1233    Subjective No new comlplaints. No falls or pain report. Leaving to go to [place] on Saturday, has to cancell the appt he has on Thursday.    Pertinent History CHF    Limitations Walking    Patient Stated Goals To improve balance and decrease falls; wife would like to address memory impairments    Currently in Pain? No/denies    Pain Score 0-No pain                 [facility] Adult PT Treatment/Exercise - 10/26/20 1236      Transfers   Transfers Sit to Stand;Stand to Sit    Sit to Stand 6: Modified independent (Device/Increase time)    Stand to Sit 6: Modified independent (Device/Increase time)      Ambulation/Gait   Ambulation/Gait Yes    Ambulation/Gait Assistance 5: Supervision    Ambulation/Gait  Assistance Details gait outdoors working on speed changes, scanning all directions with no balance loss noted.    Ambulation Distance (Feet) 1000 Feet   x1, plus around gym   Assistive device None    Gait Pattern Decreased step length - right;Decreased step length - left    Ambulation Surface Level;Indoor;Outdoor;Paved;Unlevel               Balance Exercises - 10/26/20 1244      Balance Exercises: Standing   SLS with Vectors Foam/compliant surface;Upper extremity assist 1;Intermittent upper extremity assist;Other reps (comment);Limitations    SLS with Vectors Limitations on balance board in ant/posterior direction: with 2 tall cones in front- alternating forward, then lateral foot taps with increasd difficulty clearing top of cone with left LE, pt knocking it over every other time, therefore switched to 2 foam bubbles in front: alternating forward, then lateral foot taps with emphasis on weight shifting and slowing down. light UE support on bars progressing to no UE uspport on bars. Min guard assist to min assist for balance.    Rockerboard Anterior/posterior;Lateral;EO;EC;30 seconds;Other reps (comment);Limitations    Rockerboard Limitations performed both ways on balance board with no UE support:  rocking the board with empahsis on tall posture with EO, progressing to EC. then holding the board steady for EC 30 sec's x 3 reps, progressing to EC head movements left<>right, up<>down for 10 reps each.increased assist needed with lateral direction (mod/max assist to correct balance) vs min/mod assist with ant/posterior direction.    Balance Beam standing across red foam beam: with single HHA progressing to no UE support for alternating forward stepping to floor/back onto beam, then alternating backward stepping to floor/back onto beam. cues needed for increased step length/height with min guard to min assist for balance. Increased difficulty with increased assistance needed with backward stepping.                PT Short Term Goals - 10/22/20 1626      PT SHORT TERM GOAL #1   Title Pt will initiate strengthening and balance HEP    Baseline 10/19/20: met in session today with review of program    Status Achieved      PT SHORT TERM GOAL #2   Title Pt will participate in trial use of treadmill to determine if safe to use at apartment    Baseline 10/19/20: has returned to use of treadmill at gym    Status Achieved      PT [place] #3   Title Pt will increase functional LE strength and endurance to be able to perform 15 reps sit <> stand in 30 seconds    Baseline 10/19/20: 14 reps performed, improved from 13 just not to goal level    Status Partially Met      PT SHORT TERM GOAL #4   Title Pt will demonstrate increase in gait velocity to >/= 3.3 ft/sec    Baseline 10/19/20: 3.33 ft/sec no AD    Status Achieved      PT SHORT TERM GOAL #5   Title Pt will improve FGA to >/= 22/30    Baseline 10/22/20: 21/30 scored this session, improved from 18/30 just not to goal    Status Partially Met             PT Long Term Goals - 09/21/20 2151      PT LONG TERM GOAL #1   Title Pt will demonstrate independence with final HEP and will demonstrate safe use of weights for carryover to apartment gym    Time 8    Period Weeks    Status New    Target Date 11/20/20      PT LONG TERM GOAL #2   Title Pt will demonstrate ability to perform sit > stand from very low/soft surface MOD I to demonstrate greater ability to stand from low couch and low car    Time 8    Period Weeks    Status New    Target Date 11/20/20      PT LONG TERM GOAL #3   Title Pt will increase gait velocity to >/= 3.6 ft/sec    Time 8    Period Weeks    Status New    Target Date 11/20/20      PT LONG TERM GOAL #4   Title Pt will increase FGA to >/= 24/30    Time 8    Period Weeks    Status New    Target Date 11/20/20      PT LONG TERM GOAL #5   Title Pt will negotiate 12 stairs with rail,  alternating sequence and will negotiate curb MOD I with improved  ability to clear/advance each LE    Time 8    Period Weeks    Status New    Target Date 11/20/20                 Plan - 10/26/20 1235    Clinical Impression Statement Today's skilled session continued to focus on dynamic gait on various surfaces and balance training on compliant surfaces with up to min assist needed. Pt continues to demo a posterior bias with balance challenges needing cues/facilitation to correct to midline. Improved balance noted on balance board this session compared to previous sessions. The pt is progressing toward goals and should benefit from continued PT to progress toward unmet goals.    Personal Factors and Comorbidities Comorbidity 3+;Fitness;Past/Current Experience;Time since onset of injury/illness/exacerbation    Comorbidities CHF, glaucoma, Hemiparesis of L nondominant side, shingles, HTN, HLD, tremor, short term memory loss    Examination-Activity Limitations Locomotion Level;Stairs    Examination-Participation Restrictions Community Activity    Stability/Clinical Decision Making Evolving/Moderate complexity    Rehab Potential Good    PT Frequency 2x / week    PT Duration 8 weeks    PT Treatment/Interventions ADLs/Self Care Home Management;Aquatic Therapy;Electrical Stimulation;DME Instruction;Gait training;Stair training;Functional mobility training;Therapeutic activities;Therapeutic exercise;Balance training;Neuromuscular re-education;Patient/family education;Orthotic Fit/Training;Energy conservation    PT Next Visit Plan continue to address balance on compliant surfaces with vision removed, single leg stance activities. ex's for increased left hip flexion.    PT Home Exercise Plan [id number]    Consulted and Agree with Plan of Care Patient;Family member/caregiver    Family Member Consulted Wife           Patient will benefit from skilled therapeutic intervention in order to improve  the following deficits and impairments:  Cardiopulmonary status limiting activity,Decreased balance,Decreased strength,Difficulty walking  Visit Diagnosis: Unsteadiness on feet  Muscle weakness (generalized)  Difficulty in walking, not elsewhere classified  Repeated falls     Problem List Patient Active Problem List   Diagnosis Date Noted  . History of stroke 11/27/2019  . Gait abnormality 11/27/2019    Willow Ora, PTA, [facility] [address] [place], [place] [address] [phone] 10/26/20, 4:45 PM   Name: Storm Sovine MRN: [id number] Date of Birth: [birth year]

## 2020-11-02 ENCOUNTER — Ambulatory Visit: Payer: Medicare Other | Admitting: Physical Therapy

## 2020-11-04 ENCOUNTER — Ambulatory Visit: Payer: Medicare Other | Admitting: Physical Therapy

## 2020-11-09 ENCOUNTER — Other Ambulatory Visit: Payer: Self-pay

## 2020-11-09 ENCOUNTER — Encounter: Payer: Self-pay | Admitting: Physical Therapy

## 2020-11-09 ENCOUNTER — Ambulatory Visit: Payer: Medicare Other | Attending: Family | Admitting: Physical Therapy

## 2020-11-09 DIAGNOSIS — M6281 Muscle weakness (generalized): Secondary | ICD-10-CM | POA: Insufficient documentation

## 2020-11-09 DIAGNOSIS — R2681 Unsteadiness on feet: Secondary | ICD-10-CM | POA: Diagnosis present

## 2020-11-09 DIAGNOSIS — R262 Difficulty in walking, not elsewhere classified: Secondary | ICD-10-CM | POA: Diagnosis present

## 2020-11-09 DIAGNOSIS — R296 Repeated falls: Secondary | ICD-10-CM | POA: Diagnosis present

## 2020-11-09 NOTE — Therapy (Signed)
[facility] [address] [place], [place], [address] Phone: [phone]   Fax:  [phone]  Physical Therapy Treatment  Patient Details  Name: Eric Barrett MRN: [id number] Date of Birth: July 31, 1941 Referring Provider (PT): [name], FNP   Encounter Date: 11/09/2020   PT End of Session - 11/09/20 1651    Visit Number 9    Number of Visits 17    Date for PT Re-Evaluation 11/20/20    Authorization Type Medicare    Progress Note Due on Visit 10    PT Start Time 1233    PT Stop Time 1314    PT Time Calculation (min) 41 min    Activity Tolerance Patient tolerated treatment well    Behavior During Therapy [facility] for tasks assessed/performed           Past Medical History:  Diagnosis Date  . CHF (congestive heart failure) ([place])   . Glaucoma   . Hemiparesis of left nondominant side ([place])   . History of shingles   . HLD (hyperlipidemia)   . Hypertension   . Stroke ([place])   . Tremor     Past Surgical History:  Procedure Laterality Date  . No past surgery      There were no vitals filed for this visit.   Subjective Assessment - 11/09/20 1237    Subjective Was in [place] for two weeks; [place] walking [place] the airports.  No significant issues to report.  Plans to continue to use the treadmill.    Pertinent History CHF    Limitations Walking    Patient Stated Goals To improve balance and decrease falls; wife would like to address memory impairments    Currently in Pain? No/denies                                  Balance Exercises - 11/09/20 1239      Balance Exercises: Standing   Rockerboard Anterior/posterior;Head turns;EO;10 reps;Intermittent UE support;Limitations;Lateral    Rockerboard Limitations 10 reps each LE step ups with contralateral LE high knee march with UE support and min-mod A for balance.  Rocking ant/post and then holding in midline during 10 reps head turns/nods with  and then without UE support with min A to maintain balance.  Added in perturbations to the rockerboard to focus on use of hip strategy to maintain balance.  Changed to lateral and performed weight shifting L and R with greater difficulty shifting to R.  Focused on maintaining balance in midline with head turns/nods x 10 reps.  Pt had significant difficulty maintaining balance in midline with head turns.  Added in lateral perturbations on rockerboard for balance strategy training.    Step Ups Forward;Intermittent UE support;Limitations    Step Ups Limitations on rockerboard ant/post x 10 reps each LE, contralateral LE raise for SLS balance             PT Education - 11/09/20 1651    Education Details plan for next visit to begin to check LTG and make a plan for recert vs. D/C    Person(s) Educated Patient    Methods Explanation    Comprehension Verbalized understanding            PT Short Term Goals - 10/22/20 1626      PT SHORT TERM GOAL #1   Title Pt will initiate strengthening and balance HEP  Baseline 10/19/20: met in session today with review of program    Status Achieved      PT SHORT TERM GOAL #2   Title Pt will participate in trial use of treadmill to determine if safe to use at apartment    Baseline 10/19/20: has returned to use of treadmill at gym    Status Achieved      PT SHORT TERM GOAL #3   Title Pt will increase functional LE strength and endurance to be able to perform 15 reps sit <> stand in 30 seconds    Baseline 10/19/20: 14 reps performed, improved from 13 just not to goal level    Status Partially Met      PT SHORT TERM GOAL #4   Title Pt will demonstrate increase in gait velocity to >/= 3.3 ft/sec    Baseline 10/19/20: 3.33 ft/sec no AD    Status Achieved      PT SHORT TERM GOAL #5   Title Pt will improve FGA to >/= 22/30    Baseline 10/22/20: 21/30 scored this session, improved from 18/30 just not to goal    Status Partially Met             PT  Long Term Goals - 09/21/20 2151      PT LONG TERM GOAL #1   Title Pt will demonstrate independence with final HEP and will demonstrate safe use of weights for carryover to apartment gym    Time 8    Period Weeks    Status New    Target Date 11/20/20      PT LONG TERM GOAL #2   Title Pt will demonstrate ability to perform sit > stand from very low/soft surface MOD I to demonstrate greater ability to stand from low couch and low car    Time 8    Period Weeks    Status New    Target Date 11/20/20      PT LONG TERM GOAL #3   Title Pt will increase gait velocity to >/= 3.6 ft/sec    Time 8    Period Weeks    Status New    Target Date 11/20/20      PT LONG TERM GOAL #4   Title Pt will increase FGA to >/= 24/30    Time 8    Period Weeks    Status New    Target Date 11/20/20      PT LONG TERM GOAL #5   Title Pt will negotiate 12 stairs with rail, alternating sequence and will negotiate curb MOD I with improved ability to clear/advance each LE    Time 8    Period Weeks    Status New    Target Date 11/20/20                 Plan - 11/09/20 1652    Clinical Impression Statement Continued to utilize rockerboard for dynamic balance, postural control, weight shifting and balance reaction training.  Added in external perturbations to balance training.  Pt continues to present with posterior and L bias and continued to require cues to find midline.  Pt demonstrated good use of hip strategy to correct with perturbations.  Pt also presented with increased LOB with head movements.  Will begin to assess LTG next session and will determine if pt would benefit from further PT visits.    Personal Factors and Comorbidities Comorbidity 3+;Fitness;Past/Current Experience;Time since onset of injury/illness/exacerbation    Comorbidities CHF, glaucoma,  Hemiparesis of L nondominant side, shingles, HTN, HLD, tremor, short term memory loss    Examination-Activity Limitations Locomotion Level;Stairs     Examination-Participation Restrictions Community Activity    Stability/Clinical Decision Making Evolving/Moderate complexity    Rehab Potential Good    PT Frequency 2x / week    PT Duration 8 weeks    PT Treatment/Interventions ADLs/Self Care Home Management;Aquatic Therapy;Electrical Stimulation;DME Instruction;Gait training;Stair training;Functional mobility training;Therapeutic activities;Therapeutic exercise;Balance training;Neuromuscular re-education;Patient/family education;Orthotic Fit/Training;Energy conservation    PT Next Visit Plan 10th visit PN.  Begin to check goals and determine if pt is ready to D/C or recert?    PT Home Exercise Plan [id number]    Consulted and Agree with Plan of Care Patient           Patient will benefit from skilled therapeutic intervention in order to improve the following deficits and impairments:  Cardiopulmonary status limiting activity,Decreased balance,Decreased strength,Difficulty walking  Visit Diagnosis: Unsteadiness on feet  Muscle weakness (generalized)  Difficulty in walking, not elsewhere classified  Repeated falls     Problem List Patient Active Problem List   Diagnosis Date Noted  . History of stroke 11/27/2019  . Gait abnormality 11/27/2019    Rico Junker, PT, DPT 11/09/20    4:57 PM    [facility] [address] [place], [place], [address] Phone: [phone]   Fax:  [phone]  Name: Arye Weyenberg MRN: [id number] Date of Birth: [birth year]

## 2020-11-10 NOTE — Progress Notes (Deleted)
PATIENT: Eric Barrett DOB: [birth year]  REASON FOR VISIT: follow up HISTORY FROM: patient  HISTORY OF PRESENT ILLNESS: Today 11/10/20  HISTORY Eric Barrett is a 80 year old male, seen in request by his primary care nurse practitioner Dustin Folks A for evaluation of bilateral lower extremity tremor, initial evaluation was on November 27, 2019.  I have reviewed and summarized the referring note from the referring physician.  He has past medical history of hypertension, hyperlipidemia, tobacco use, reported history of stroke in 2019, presented with acute onset dysarthria, left upper and lower extremity weakness in 2019, was treated at [place], moved to [place] in September 2020, lives in apartment, continue complains of mild left leg weakness, tremorous unsteady sensation when bearing weight, denies significant gait abnormality  He was seen by cardiologist recently, her cardiogram showed ejection fraction 60% in 5053, chronic diastolic heart failure, trace edema, ordered 30 days cardiac monitoring   Update February 26, 2020 SS: Here today to follow-up on sensation of leg weakness, mostly in the left and tremor in the leg, usually when getting in and out of the car.  Has significantly improved, as he is exercising and walking more, but sensation is still present.  Describes this post his stroke in 2019 in [place]. Onset stroke at that time presented with generalized weakness, confusion.  Remains on aspirin.  Has follow-up with cardiology, MRA chest showed mild aortic dilation.  MRI of the brain was ordered but is yet to be completed. No weakness of the left arm reported. Cardiac monitor study showed no significant heart rhythm abnormalities.  Update November 11, 2020 SS: Never had MRI of the brain.  REVIEW OF SYSTEMS: Out of a complete 14 system review of symptoms, the patient complains only of the following symptoms, and all other reviewed systems are negative.  N/A  ALLERGIES: No Known  Allergies  HOME MEDICATIONS: Outpatient Medications Prior to Visit  Medication Sig Dispense Refill  . amLODipine (NORVASC) 5 MG tablet Take 5 mg by mouth daily.    . ASPIRIN 81 PO Take 2 tablets by mouth daily.     . Cholecalciferol (VITAMIN D3) 25 MCG (1000 UT) CAPS Take 1,000 Units by mouth daily.    . multivitamin-lutein (OCUVITE-LUTEIN) CAPS capsule Take 1 capsule by mouth daily.    . Omega-3 Fatty Acids (FISH OIL) 1000 MG CAPS Take 1 capsule by mouth daily.    . rosuvastatin (CRESTOR) 20 MG tablet Take 20 mg by mouth daily.    . Travoprost, BAK Free, (TRAVATAN) 0.004 % SOLN ophthalmic solution Place 1 drop into both eyes at bedtime.     No facility-administered medications prior to visit.    PAST MEDICAL HISTORY: Past Medical History:  Diagnosis Date  . CHF (congestive heart failure) ([place])   . Glaucoma   . Hemiparesis of left nondominant side ([place])   . History of shingles   . HLD (hyperlipidemia)   . Hypertension   . Stroke ([place])   . Tremor     PAST SURGICAL HISTORY: Past Surgical History:  Procedure Laterality Date  . No past surgery      FAMILY HISTORY: Family History  Problem Relation Age of Onset  . Heart attack Mother   . Heart attack Father     SOCIAL HISTORY: Social History   Socioeconomic History  . Marital status: Married    Spouse name: Not on file  . Number of children: 0  . Years of education: 12  . Highest education level: High  school graduate  Occupational History  . Occupation: Retired  Tobacco Use  . Smoking status: Former Research scientist (life sciences)  . Smokeless tobacco: Never Used  Substance and Sexual Activity  . Alcohol use: Not Currently  . Drug use: Never  . Sexual activity: Not on file  Other Topics Concern  . Not on file  Social History Narrative   Lives at home with his wife.   Right-handed.   One cup coffee per day.   Social Determinants of Health   Financial Resource Strain: Not on file  Food Insecurity: Not on file  Transportation  Needs: Not on file  Physical Activity: Not on file  Stress: Not on file  Social Connections: Not on file  Intimate Partner Violence: Not on file   PHYSICAL EXAM  There were no vitals filed for this visit. There is no height or weight on file to calculate BMI.  Generalized: Well developed, in no acute distress   Neurological examination  Mentation: Alert oriented to time, place, history taking. Follows all commands speech and language fluent Cranial nerve II-XII: Pupils were equal round reactive to light. Extraocular movements were full, visual field were full on confrontational test. Facial sensation and strength were normal. Head turning and shoulder shrug  were normal and symmetric. Motor: The motor testing reveals 5 over 5 strength of all 4 extremities. Good symmetric motor tone is noted throughout. No tremor noted. Sensory: Sensory testing is intact to soft touch on all 4 extremities. No evidence of extinction is noted.  Coordination: Cerebellar testing reveals good finger-nose-finger and heel-to-shin bilaterally.  Gait and station: Gait is steady, tendency to rotate left foot outward Reflexes: Deep tendon reflexes are symmetric and normal bilaterally (Dr. Krista Blue previously noted hyperreflexia on the left upper and lower)  DIAGNOSTIC DATA (LABS, IMAGING, TESTING) - I reviewed patient records, labs, notes, testing and imaging myself where available.  Lab Results  Component Value Date   WBC 7.1 11/18/2019   HGB 14.1 11/18/2019   HCT 42.7 11/18/2019   MCV 83 11/18/2019   PLT 210 11/18/2019      Component Value Date/Time   NA 141 12/24/2019 1052   K 4.9 12/24/2019 1052   CL 102 12/24/2019 1052   CO2 27 12/24/2019 1052   GLUCOSE 95 12/24/2019 1052   BUN 17 12/24/2019 1052   CREATININE 1.36 (H) 12/24/2019 1052   CALCIUM 9.9 12/24/2019 1052   ALBUMIN 4.5 11/18/2019 1447   GFRNONAA 49 (L) 12/24/2019 1052   GFRAA 57 (L) 12/24/2019 1052   Lab Results  Component Value Date    CHOL 118 11/18/2019   HDL 52 11/18/2019   LDLCALC 54 11/18/2019   TRIG 49 11/18/2019   CHOLHDL 2.3 11/18/2019   No results found for: HGBA1C No results found for: VITAMINB12 No results found for: TSH    ASSESSMENT AND PLAN 80 y.o. year old male  has a past medical history of CHF (congestive heart failure) ([place]), Glaucoma, Hemiparesis of left nondominant side ([place]), History of shingles, HLD (hyperlipidemia), Hypertension, Stroke ([place]), and Tremor. here with:  1.  Reported history of stroke -History of stroke in [place], reported post stroke, leg weakness, mostly the leg but could be both, tremor in left leg, symptoms most notable when getting in and out of cars -MRI of the brain has yet to be completed, I will get this set up -Has vascular risk factors of aging, hypertension, hyperlipidemia -Recommend continue aspirin 81 mg daily -Cardiac monitor study did not show significant heart rhythm  abnormalities -Symptoms much improved with walking, and exercise -He will follow-up in 6 months or sooner if needed, will update once MRI results   I spent 30 minutes of face-to-face and non-face-to-face time with patient.  This included previsit chart review, lab review, study review, order entry, electronic health record documentation, patient education.  Butler Denmark, AGNP-C, DNP 11/10/2020, 10:51 AM [facility] [address], [place], [place] [address] [phone]

## 2020-11-11 ENCOUNTER — Ambulatory Visit: Payer: Medicare Other | Admitting: Neurology

## 2020-11-11 ENCOUNTER — Ambulatory Visit: Payer: Medicare Other | Admitting: Physical Therapy

## 2020-11-16 ENCOUNTER — Ambulatory Visit: Payer: Medicare Other | Admitting: Physical Therapy

## 2020-11-16 ENCOUNTER — Other Ambulatory Visit: Payer: Self-pay

## 2020-11-16 DIAGNOSIS — R2681 Unsteadiness on feet: Secondary | ICD-10-CM

## 2020-11-16 DIAGNOSIS — M6281 Muscle weakness (generalized): Secondary | ICD-10-CM

## 2020-11-16 DIAGNOSIS — R262 Difficulty in walking, not elsewhere classified: Secondary | ICD-10-CM

## 2020-11-16 DIAGNOSIS — R296 Repeated falls: Secondary | ICD-10-CM

## 2020-11-16 NOTE — Therapy (Signed)
[facility] [address] [place], [place], [address] Phone: [phone]   Fax:  [phone]  Physical Therapy Treatment and 10th Visit Progress Note  Patient Details  Name: Eric Barrett MRN: [id number] Date of Birth: 1940-11-17 Referring Provider (PT): [name], FNP   Encounter Date: 11/16/2020   Progress Note Reporting Period 09/21/2020 to 11/16/2020  See note below for Objective Data and Assessment of Progress/Goals.     PT End of Session - 11/16/20 1238    Visit Number 10    Number of Visits 17    Date for PT Re-Evaluation 11/20/20    Authorization Type Medicare    Progress Note Due on Visit 10    PT Start Time 9030    PT Stop Time 1315    PT Time Calculation (min) 40 min    Activity Tolerance Patient tolerated treatment well    Behavior During Therapy WFL for tasks assessed/performed           Past Medical History:  Diagnosis Date  . CHF (congestive heart failure) ([place])   . Glaucoma   . Hemiparesis of left nondominant side ([place])   . History of shingles   . HLD (hyperlipidemia)   . Hypertension   . Stroke ([place])   . Tremor     Past Surgical History:  Procedure Laterality Date  . No past surgery      There were no vitals filed for this visit.   Subjective Assessment - 11/16/20 1239    Subjective Nothing new to report; has not used the treadmill this week.    Pertinent History CHF    Limitations Walking    Patient Stated Goals To improve balance and decrease falls; wife would like to address memory impairments    Currently in Pain? No/denies              [facility] PT Assessment - 11/16/20 1246      Assessment   Medical Diagnosis L hemiparesis, falls, CVA in 2019    Referring Provider (PT) [name], FNP    Onset Date/Surgical Date 07/27/20    Prior Therapy HH in [place] after CVA      Precautions   Precautions Fall    Precaution Comments CHF, glaucoma, Hemiparesis of L  nondominant side, shingles, HTN, HLD, tremor      Prior Function   Level of Independence Independent      Ambulation/Gait   Stairs Yes    Stairs Assistance 5: Supervision    Stairs Assistance Details (indicate cue type and reason) no evidence of imbalance or catching foot on stairs today    Stair Management Technique No rails;Alternating pattern;Forwards    Number of Stairs 12    Height of Stairs 6    Ramp 7: Independent    Curb 7: Independent    Curb Details (indicate cue type and reason) pt demonstrated safe sequence and no foot catch/drag on edge of curb      Standardized Balance Assessment   Standardized Balance Assessment Five Times Sit to Stand;10 meter walk test    Five times sit to stand comments  14 seconds 5 time sit to stand from low bench 17" tall independently without use of UE; 30 second sit to stand from regular chair: 15 repetitions without use of UE    10 Meter Walk 9.7 seconds or 3.4 ft/sec no AD      Functional Gait  Assessment   Gait assessed  Yes    Gait Level Surface Walks 20 ft in less than 7 sec but greater than 5.5 sec, uses assistive device, slower speed, mild gait deviations, or deviates 6-10 in outside of the 12 in walkway width.    Change in Gait Speed Able to smoothly change walking speed without loss of balance or gait deviation. Deviate no more than 6 in outside of the 12 in walkway width.    Gait with Horizontal Head Turns Performs head turns smoothly with no change in gait. Deviates no more than 6 in outside 12 in walkway width    Gait with Vertical Head Turns Performs head turns with no change in gait. Deviates no more than 6 in outside 12 in walkway width.    Gait and Pivot Turn Pivot turns safely within 3 sec and stops quickly with no loss of balance.    Step Over Obstacle Is able to step over 2 stacked shoe boxes taped together (9 in total height) without changing gait speed. No evidence of imbalance.    Gait with Narrow Base of Support Ambulates  less than 4 steps heel to toe or cannot perform without assistance.    Gait with Eyes Closed Walks 20 ft, slow speed, abnormal gait pattern, evidence for imbalance, deviates 10-15 in outside 12 in walkway width. Requires more than 9 sec to ambulate 20 ft.   9.7 seconds   Ambulating Backwards Walks 20 ft, uses assistive device, slower speed, mild gait deviations, deviates 6-10 in outside 12 in walkway width.    Steps Alternating feet, no rail.    Total Score 23    FGA comment: 23/30 medium falls risk                                 PT Education - 11/16/20 1506    Education Details Progress towards goals, plans for 4 more visits and areas to continue to address.  Began to revise HEP with resisted side stepping at counter top x 10 reps to each side with black theraband; will continue revision at next session.    Person(s) Educated Patient    Methods Explanation;Demonstration    Comprehension Verbalized understanding;Returned demonstration;Need further instruction          Access Code: [email or web address] URL: [email or web address] Date: 11/16/2020 Prepared by: Misty Stanley  Exercises Tandem Walking with Counter Support - 1-2 x daily - 4-5 x weekly - 3 sets Romberg Stance Eyes Closed on Foam Pad - 1-2 x daily - 4-5 x weekly - 3 sets - 30 hold Side Stepping with Resistance at Thighs and Feet - 1 x daily - 7 x weekly - 2 sets - 10 reps   Will finish revision next session    PT Short Term Goals - 10/22/20 1626      PT SHORT TERM GOAL #1   Title Pt will initiate strengthening and balance HEP    Baseline 10/19/20: met in session today with review of program    Status Achieved      PT SHORT TERM GOAL #2   Title Pt will participate in trial use of treadmill to determine if safe to use at apartment    Baseline 10/19/20: has returned to use of treadmill at gym    Status Achieved      PT [place] #3   Title Pt will increase functional LE strength and  endurance to be able to perform 15 reps  sit <> stand in 30 seconds    Baseline 10/19/20: 14 reps performed, improved from 13 just not to goal level    Status Partially Met      PT SHORT TERM GOAL #4   Title Pt will demonstrate increase in gait velocity to >/= 3.3 ft/sec    Baseline 10/19/20: 3.33 ft/sec no AD    Status Achieved      PT SHORT TERM GOAL #5   Title Pt will improve FGA to >/= 22/30    Baseline 10/22/20: 21/30 scored this session, improved from 18/30 just not to goal    Status Partially Met             PT Long Term Goals - 11/16/20 1239      PT LONG TERM GOAL #1   Title Pt will demonstrate independence with final HEP and will demonstrate safe use of weights for carryover to apartment gym    Time 8    Period Weeks    Status Achieved      PT LONG TERM GOAL #2   Title Pt will demonstrate ability to perform sit > stand from very low/soft surface MOD I to demonstrate greater ability to stand from low couch and low car    Baseline 14 seconds 5 time sit to stand from low bench 17" tall independently without use of UE    Time 8    Period Weeks    Status Achieved      PT LONG TERM GOAL #3   Title Pt will increase gait velocity to >/= 3.6 ft/sec    Baseline 3.4 improved but not to goal    Time 8    Period Weeks    Status Partially Met      PT LONG TERM GOAL #4   Title Pt will increase FGA to >/= 24/30    Baseline 23/30    Time 8    Period Weeks    Status Partially Met      PT LONG TERM GOAL #5   Title Pt will negotiate 12 stairs with rail, alternating sequence and will negotiate curb MOD I with improved ability to clear/advance each LE    Time 8    Period Weeks    Status Achieved          New goals for recert:   PT Short Term Goals - 11/16/20 1508      PT SHORT TERM GOAL #1   Title = LTG           PT Long Term Goals - 11/16/20 1508      PT LONG TERM GOAL #1   Title Pt will demonstrate independence with final HEP and will demonstrate safe use of  weights and treadmill for carryover to apartment gym    Time 4    Period Weeks    Status Revised    Target Date 12/16/20      PT LONG TERM GOAL #3   Title Pt will increase gait velocity to >/= 3.6 ft/sec    Baseline 3.4 improved but not to goal    Time 4    Period Weeks    Status Revised    Target Date 12/16/20      PT LONG TERM GOAL #4   Title Pt will increase FGA to >/= 25/30    Baseline 23/30    Time 4    Period Weeks    Status Revised    Target Date 12/16/20  Plan - 11/16/20 1459    Clinical Impression Statement Pt is making good progress towards LTG.  Pt has met 3/5 LTG and partially met other two goals.  Pt demonstrates improvement in gait velocity but not to goal level; pt also demonstrates improved safety and decreased falls risk as indicated by FGA but again, not to goal level.  Pt is demonstrating improved safety with curb and stair negotiation due to improved foot clearance and stance phase stability.  Pt has met HEP goal and 30 second sit to stand goals and demonstrates ability to stand independently from lower surfaces.  Pt continues to be at medium falls risk and demonstrates continued balance impairments on unstable and compliant surfaces and will benefit from supervision of PT as pt begins to work towards transition to community wellness/HEP at fitness center (weight machines, treadmill).  Will renew for 4 more visits to continue to address these impairments and functional limitations.    Personal Factors and Comorbidities Comorbidity 3+;Fitness;Past/Current Experience;Time since onset of injury/illness/exacerbation    Comorbidities CHF, glaucoma, Hemiparesis of L nondominant side, shingles, HTN, HLD, tremor, short term memory loss    Examination-Activity Limitations Locomotion Level;Stairs    Examination-Participation Restrictions Community Activity    Rehab Potential Good    PT Frequency 1x / week    PT Duration 4 weeks    PT Treatment/Interventions  ADLs/Self Care Home Management;Aquatic Therapy;Electrical Stimulation;DME Instruction;Gait training;Stair training;Functional mobility training;Therapeutic activities;Therapeutic exercise;Balance training;Neuromuscular re-education;Patient/family education;Orthotic Fit/Training;Energy conservation    PT Next Visit Plan Recert for 4 more visits.  Upgrade HEP - more dynamic balance, use of theraband and give new handout.  Treadmill for aerobic.  Dynamic balance on rockerboard and compliant surfaces.  Tandem balance    PT Home Exercise Plan [id number]    Consulted and Agree with Plan of Care Patient           Patient will benefit from skilled therapeutic intervention in order to improve the following deficits and impairments:  Cardiopulmonary status limiting activity,Decreased balance,Decreased strength,Difficulty walking  Visit Diagnosis: Unsteadiness on feet  Muscle weakness (generalized)  Difficulty in walking, not elsewhere classified  Repeated falls     Problem List Patient Active Problem List   Diagnosis Date Noted  . History of stroke 11/27/2019  . Gait abnormality 11/27/2019   Eric Barrett, PT, DPT 11/16/20    3:10 PM    [facility] [address] [place], [place], [address] Phone: [phone]   Fax:  [phone]  Name: Eric Barrett MRN: [id number] Date of Birth: June 17, 1941

## 2020-11-16 NOTE — Patient Instructions (Signed)
Access Code: [email or web address] URL: [email or web address] Date: 11/16/2020 Prepared by: Misty Stanley  Exercises Tandem Walking with Counter Support - 1-2 x daily - 4-5 x weekly - 3 sets Romberg Stance Eyes Closed on Foam Pad - 1-2 x daily - 4-5 x weekly - 3 sets - 30 hold Side Stepping with Resistance at Thighs and Feet - 1 x daily - 7 x weekly - 2 sets - 10 reps   Will finish revision next session

## 2020-11-30 ENCOUNTER — Ambulatory Visit: Payer: Medicare Other | Admitting: Physical Therapy

## 2020-12-05 DEATH — deceased

## 2020-12-07 ENCOUNTER — Other Ambulatory Visit: Payer: Self-pay

## 2020-12-07 ENCOUNTER — Encounter: Payer: Self-pay | Admitting: Physical Therapy

## 2020-12-07 ENCOUNTER — Ambulatory Visit: Payer: Medicare Other | Attending: Family | Admitting: Physical Therapy

## 2020-12-07 DIAGNOSIS — R296 Repeated falls: Secondary | ICD-10-CM | POA: Diagnosis present

## 2020-12-07 DIAGNOSIS — R262 Difficulty in walking, not elsewhere classified: Secondary | ICD-10-CM | POA: Diagnosis present

## 2020-12-07 DIAGNOSIS — M6281 Muscle weakness (generalized): Secondary | ICD-10-CM | POA: Diagnosis present

## 2020-12-07 DIAGNOSIS — R2681 Unsteadiness on feet: Secondary | ICD-10-CM | POA: Insufficient documentation

## 2020-12-08 NOTE — Therapy (Signed)
[facility] [address] [place], [place], [address] Phone: [phone]   Fax:  [phone]  Physical Therapy Treatment  Patient Details  Name: Eric Barrett MRN: [id number] Date of Birth: [birth year] Referring Provider (PT): [name], FNP   Encounter Date: 12/07/2020   PT End of Session - 12/07/20 1236    Visit Number 11    Number of Visits 14    Date for PT Re-Evaluation 12/16/20    Authorization Type Medicare    Progress Note Due on Visit 20    PT Start Time 1233    PT Stop Time 1315    PT Time Calculation (min) 42 min    Equipment Utilized During Treatment Gait belt    Activity Tolerance Patient tolerated treatment well    Behavior During Therapy WFL for tasks assessed/performed           Past Medical History:  Diagnosis Date  . CHF (congestive heart failure) ([place])   . Glaucoma   . Hemiparesis of left nondominant side ([place])   . History of shingles   . HLD (hyperlipidemia)   . Hypertension   . Stroke ([place])   . Tremor     Past Surgical History:  Procedure Laterality Date  . No past surgery      There were no vitals filed for this visit.   Subjective Assessment - 12/07/20 1235    Subjective No new complaints. No falls or pain to report. Has not used Treadmill at home as yet. Reports HEP is going well.    Patient is accompained by: Family member    Pertinent History CHF    Limitations Walking    Patient Stated Goals To improve balance and decrease falls; wife would like to address memory impairments    Currently in Pain? No/denies    Pain Score 0-No pain                 [facility] Adult PT Treatment/Exercise - 12/07/20 1237      Transfers   Transfers Sit to Stand;Stand to Sit    Sit to Stand 6: Modified independent (Device/Increase time)    Stand to Sit 6: Modified independent (Device/Increase time)      Ambulation/Gait   Ambulation/Gait Yes    Ambulation/Gait Assistance 5:  Supervision    Ambulation/Gait Assistance Details around gym with session. cues for increased step length/stride length needed at times wih gait.    Assistive device None    Gait Pattern Decreased step length - right;Decreased step length - left    Ambulation Surface Level;Indoor      Knee/Hip Exercises: Aerobic   Tread Mill 1.5 mph x 3 minutes, decreased to 1.0 mph for last 3 minutes due to increased shuffling noted, bil UE support with no incline.               Balance Exercises - 12/07/20 1246      Balance Exercises: Standing   Rockerboard Anterior/posterior;Lateral;Head turns;EO;EC;30 seconds;Other reps (comment);Limitations    Rockerboard Limitations performed both ways on balance board with no UE support:    Balance Beam blue foam beam with no UE support- alternating forward heel taps to floor/back onto beam, then alternating backward toe taps to floor/back onot beam for ~10 reps each . cues for increased step length/height to clear beam surface.    Tandem Gait Forward;Upper extremity support;Retro;4 reps;Foam/compliant surface;Limitations    Tandem Gait Limitations on blue foam beam with cues on posture and  step placement, min guard assist with light touch to bars.    Sidestepping Foam/compliant support;4 reps;Limitations    Sidestepping Limitations on blue foam beam with no UE support/occasional touch to bars with min guard to min assist, cues on step length and posture.    Sit to Stand Standard surface;Foam/compliant surface;Limitations    Sit to Stand Limitations seated with feet across red foam beam- cues for full upright standing and controlled sitting, min assist at times for 5 reps..               PT Short Term Goals - 11/16/20 1508      PT SHORT TERM GOAL #1   Title = LTG             PT Long Term Goals - 11/16/20 1508      PT LONG TERM GOAL #1   Title Pt will demonstrate independence with final HEP and will demonstrate safe use of weights and treadmill  for carryover to apartment gym    Time 4    Period Weeks    Status Revised    Target Date 12/16/20      PT LONG TERM GOAL #3   Title Pt will increase gait velocity to >/= 3.6 ft/sec    Baseline 3.4 improved but not to goal    Time 4    Period Weeks    Status Revised    Target Date 12/16/20      PT LONG TERM GOAL #4   Title Pt will increase FGA to >/= 25/30    Baseline 23/30    Time 4    Period Weeks    Status Revised    Target Date 12/16/20              12/07/20 1237  Plan  Clinical Impression Statement Today's skilled session continued to focus on strengthening and high level balance training with no issues noted or reported in session. The pt needed cues for increased step length on treadmil, however demo's good step/stride length with gait off treadmill in session. The pt continues to be challenged with complaint surfaces with up to min assist needed. The pt is progressing toward goals and should benefit from continued PT to progress toward unmet goals.  Personal Factors and Comorbidities Comorbidity 3+;Fitness;Past/Current Experience;Time since onset of injury/illness/exacerbation  Comorbidities CHF, glaucoma, Hemiparesis of L nondominant side, shingles, HTN, HLD, tremor, short term memory loss  Examination-Activity Limitations Locomotion Level;Stairs  Examination-Participation Restrictions Community Activity  Pt will benefit from skilled therapeutic intervention in order to improve on the following deficits Cardiopulmonary status limiting activity;Decreased balance;Decreased strength;Difficulty walking  Rehab Potential Good  PT Frequency 1x / week  PT Duration 4 weeks  PT Treatment/Interventions ADLs/Self Care Home Management;Aquatic Therapy;Electrical Stimulation;DME Instruction;Gait training;Stair training;Functional mobility training;Therapeutic activities;Therapeutic exercise;Balance training;Neuromuscular re-education;Patient/family education;Orthotic  Fit/Training;Energy conservation  PT Next Visit Plan Continue to update HEP - more dynamic balance, use of theraband and give new handout.  Treadmill for aerobic.  Dynamic balance on rockerboard and compliant surfaces.  Tandem balance  PT Home Exercise Plan [id number]  Consulted and Agree with Plan of Care Patient       Patient will benefit from skilled therapeutic intervention in order to improve the following deficits and impairments:  Cardiopulmonary status limiting activity,Decreased balance,Decreased strength,Difficulty walking  Visit Diagnosis: Unsteadiness on feet  Muscle weakness (generalized)  Difficulty in walking, not elsewhere classified     Problem List Patient Active Problem List   Diagnosis Date Noted  .  History of stroke 11/27/2019  . Gait abnormality 11/27/2019    Willow Ora, PTA, [facility] [address], [place], [place] [address] [phone] 12/08/20, 9:47 PM   Name: Maziah Keeling MRN: [id number] Date of Birth: 04-24-1941

## 2020-12-14 ENCOUNTER — Other Ambulatory Visit: Payer: Self-pay

## 2020-12-14 ENCOUNTER — Ambulatory Visit: Payer: Medicare Other | Admitting: Physical Therapy

## 2020-12-14 ENCOUNTER — Encounter: Payer: Self-pay | Admitting: Physical Therapy

## 2020-12-14 DIAGNOSIS — R2681 Unsteadiness on feet: Secondary | ICD-10-CM

## 2020-12-14 DIAGNOSIS — M6281 Muscle weakness (generalized): Secondary | ICD-10-CM

## 2020-12-14 DIAGNOSIS — R262 Difficulty in walking, not elsewhere classified: Secondary | ICD-10-CM

## 2020-12-14 NOTE — Therapy (Signed)
[facility] [address] [place], [place], [address] Phone: [phone]   Fax:  [phone]  Physical Therapy Treatment  Patient Details  Name: Eric Barrett MRN: [id number] Date of Birth: [birth year] Referring Provider (PT): [name], FNP   Encounter Date: 12/14/2020   PT End of Session - 12/14/20 1237    Visit Number 12    Number of Visits 14    Date for PT Re-Evaluation 12/16/20    Authorization Type Medicare    Progress Note Due on Visit 20    PT Start Time 1233    PT Stop Time 1311    PT Time Calculation (min) 38 min    Equipment Utilized During Treatment Gait belt    Activity Tolerance Patient tolerated treatment well    Behavior During Therapy WFL for tasks assessed/performed           Past Medical History:  Diagnosis Date  . CHF (congestive heart failure) ([place])   . Glaucoma   . Hemiparesis of left nondominant side ([place])   . History of shingles   . HLD (hyperlipidemia)   . Hypertension   . Stroke ([place])   . Tremor     Past Surgical History:  Procedure Laterality Date  . No past surgery      There were no vitals filed for this visit.   Subjective Assessment - 12/14/20 1236    Subjective No new complaints. No falls or pain to report.    Patient is accompained by: Family member   spouse in lobby   Pertinent History CHF    Limitations Walking    Patient Stated Goals To improve balance and decrease falls; wife would like to address memory impairments    Currently in Pain? No/denies              [facility] PT Assessment - 12/14/20 1238      Standardized Balance Assessment   Standardized Balance Assessment 10 meter walk test    10 Meter Walk 9.34 sec's= 3.51 ft/sec no AD      Functional Gait  Assessment   Gait assessed  Yes    Gait Level Surface Walks 20 ft in less than 7 sec but greater than 5.5 sec, uses assistive device, slower speed, mild gait deviations, or deviates 6-10 in  outside of the 12 in walkway width.   6.19 sec's   Change in Gait Speed Able to smoothly change walking speed without loss of balance or gait deviation. Deviate no more than 6 in outside of the 12 in walkway width.    Gait with Horizontal Head Turns Performs head turns smoothly with no change in gait. Deviates no more than 6 in outside 12 in walkway width    Gait with Vertical Head Turns Performs head turns with no change in gait. Deviates no more than 6 in outside 12 in walkway width.    Gait and Pivot Turn Pivot turns safely within 3 sec and stops quickly with no loss of balance.    Step Over Obstacle Is able to step over 2 stacked shoe boxes taped together (9 in total height) without changing gait speed. No evidence of imbalance.    Gait with Narrow Base of Support Ambulates 4-7 steps.    Gait with Eyes Closed Walks 20 ft, slow speed, abnormal gait pattern, evidence for imbalance, deviates 10-15 in outside 12 in walkway width. Requires more than 9 sec to ambulate 20 ft.   9.4 sec's  Ambulating Backwards Walks 20 ft, no assistive devices, good speed, no evidence for imbalance, normal gait    Steps Alternating feet, no rail.    Total Score 25    FGA comment: 25/30= lower risk for falls                  [facility] Adult PT Treatment/Exercise - 12/14/20 1241      Transfers   Transfers Sit to Stand;Stand to Sit    Sit to Stand 6: Modified independent (Device/Increase time)    Stand to Sit 6: Modified independent (Device/Increase time)      Ambulation/Gait   Ambulation/Gait Yes    Ambulation/Gait Assistance 5: Supervision    Ambulation/Gait Assistance Details gait outdoors working on increased gait speed with PTA setting the pace, progressing to pt scanning all directions randomly and/or engaged in conversation while maintaining fast pace.    Ambulation Distance (Feet) 1000 Feet   x1, plus around gym with session   Assistive device None    Gait Pattern Decreased step length -  right;Decreased step length - left    Ambulation Surface Level;Indoor               Balance Exercises - 12/14/20 1259      Balance Exercises: Standing   Rockerboard Anterior/posterior;Lateral;EO;EC;30 seconds;Other reps (comment);Limitations    Rockerboard Limitations performed both ways on balance board with no UE support: rocking the board with EO, progressing to EC with emphasis on tall posture/weight shifting with min guard to min assist; then holding the board steady for EC 30 sec's x 3 reps with min guard to min assist, cues on posture/weight shifting to assist with balance.               PT Short Term Goals - 11/16/20 1508      PT SHORT TERM GOAL #1   Title = LTG             PT Long Term Goals - 12/14/20 1237      PT LONG TERM GOAL #1   Title Pt will demonstrate independence with final HEP and will demonstrate safe use of weights and treadmill for carryover to apartment gym    Time 4    Period Weeks    Status On-going      PT LONG TERM GOAL #3   Title Pt will increase gait velocity to >/= 3.6 ft/sec    Baseline 12/14/20: 3.51 ft/sec, improved from 3.4 ft/sec, just not to goal level    Time --    Period --    Status Partially Met      PT LONG TERM GOAL #4   Title Pt will increase FGA to >/= 25/30    Baseline 12/14/20: 25/30 scored today    Time --    Period --    Status Achieved                 Plan - 12/14/20 1237    Clinical Impression Statement Today's skilled session initially address progress toward LTGs. Pt has met his FGA goal with a score of 25/30 and partially met the 10 meter gait speed goal with a time of 9.34 ft/sec no AD. Pt has not met goal #1 at this time to return to his apartment gym for community fitness. The pt is to use the gym between now and his next visit and bring any questions/issues he has with him at that time. Remainder of session continued to focus on dynamic  gait on various surfaces and balance training on compliant  surfaces. The pt is making progress toward goals.    Personal Factors and Comorbidities Comorbidity 3+;Fitness;Past/Current Experience;Time since onset of injury/illness/exacerbation    Comorbidities CHF, glaucoma, Hemiparesis of L nondominant side, shingles, HTN, HLD, tremor, short term memory loss    Examination-Activity Limitations Locomotion Level;Stairs    Examination-Participation Restrictions Community Activity    Rehab Potential Good    PT Frequency 1x / week    PT Duration 4 weeks    PT Treatment/Interventions ADLs/Self Care Home Management;Aquatic Therapy;Electrical Stimulation;DME Instruction;Gait training;Stair training;Functional mobility training;Therapeutic activities;Therapeutic exercise;Balance training;Neuromuscular re-education;Patient/family education;Orthotic Fit/Training;Energy conservation    PT Next Visit Plan check remaining LTG for anticipated discharge (recert/discharge that visit)    PT Home Exercise Plan [id number]    Consulted and Agree with Plan of Care Patient           Patient will benefit from skilled therapeutic intervention in order to improve the following deficits and impairments:  Cardiopulmonary status limiting activity,Decreased balance,Decreased strength,Difficulty walking  Visit Diagnosis: Unsteadiness on feet  Muscle weakness (generalized)  Difficulty in walking, not elsewhere classified     Problem List Patient Active Problem List   Diagnosis Date Noted  . History of stroke 11/27/2019  . Gait abnormality 11/27/2019    Willow Ora, PTA, [facility] [address], [place], [place] [address] [phone] 12/14/20, 10:35 PM  Name: Cecilia Nishikawa MRN: [id number] Date of Birth: [birth year]

## 2020-12-21 ENCOUNTER — Other Ambulatory Visit: Payer: Self-pay

## 2020-12-21 ENCOUNTER — Ambulatory Visit: Payer: Medicare Other | Admitting: Physical Therapy

## 2020-12-21 DIAGNOSIS — R2681 Unsteadiness on feet: Secondary | ICD-10-CM

## 2020-12-21 DIAGNOSIS — R296 Repeated falls: Secondary | ICD-10-CM

## 2020-12-21 DIAGNOSIS — M6281 Muscle weakness (generalized): Secondary | ICD-10-CM

## 2020-12-21 DIAGNOSIS — R262 Difficulty in walking, not elsewhere classified: Secondary | ICD-10-CM

## 2020-12-21 NOTE — Therapy (Signed)
[facility] [address] [place], [place], [address] Phone: [phone]   Fax:  [phone]  Physical Therapy Treatment and D/C Summary  Patient Details  Name: Eric Barrett MRN: [id number] Date of Birth: Jun 04, 1941 Referring Provider (PT): [name], FNP   Encounter Date: 12/21/2020   PT End of Session - 12/21/20 1307    Visit Number 13    Number of Visits 14    Date for PT Re-Evaluation 12/16/20    Authorization Type Medicare    Progress Note Due on Visit 20    PT Start Time 1230    PT Stop Time 1308    PT Time Calculation (min) 38 min    Activity Tolerance Patient tolerated treatment well    Behavior During Therapy [facility] for tasks assessed/performed           Past Medical History:  Diagnosis Date  . CHF (congestive heart failure) ([place])   . Glaucoma   . Hemiparesis of left nondominant side ([place])   . History of shingles   . HLD (hyperlipidemia)   . Hypertension   . Stroke ([place])   . Tremor     Past Surgical History:  Procedure Laterality Date  . No past surgery      There were no vitals filed for this visit.   Subjective Assessment - 12/21/20 1233    Subjective Has not been to the complex gym or used treadmill.  Has been using dumbbells at home and walking to the mailbox.    Patient is accompained by: Family member   spouse in lobby   Pertinent History CHF    Limitations Walking    Patient Stated Goals To improve balance and decrease falls; wife would like to address memory impairments    Currently in Pain? No/denies                             [facility] Adult PT Treatment/Exercise - 12/21/20 1236      Knee/Hip Exercises: Aerobic   Tread Mill 1.5 mph x 4 minutes with bilat UE support; pt unable to take longer steps or increase stance time at faster speed so slowed down to 1.0 mph for final 4 minutes to allow pt to focus on increasing step length and stance time.  Pt only able  to take a longer step with LLE, unable to increase L stance time.  As pt fatigued he returned to shorter step, decreased heel strike and shuffling.          Reviewed and revised final HEP below:  For Treadmill at Gym: -Start at slower speed: 1.0 mph - 1.2 mph -Start at shorter time: 6 to 8 minutes total  Gradually progress from there every couple of weeks.  Access Code: [email or web address] URL: [email or web address] Date: 12/21/2020 Prepared by: Misty Stanley  Exercises Tandem Walking with Counter Support - 1-2 x daily - 4-5 x weekly - 4 sets - no change to this exercise, cued pt to slow down and focus on heel > toe. Heel Walking with Counter Support - 1-2 x daily - 4-5 x weekly - 4 sets - added  Standing Hip Extension with Counter Support - 1 x daily - 4-5 x weekly - 2 sets - 10 reps - added, cues to keep knee straight and to extend thigh with lower leg for glute activation and increased hip extension ROM Romberg Stance Eyes Closed on Foam Pad - 1-2  x daily - 4-5 x weekly - 2 sets - 30 hold - no change to this exercise due to ongoing challenge    PT Short Term Goals - 11/16/20 1508      PT SHORT TERM GOAL #1   Title = LTG             PT Long Term Goals - 12/21/20 1308      PT LONG TERM GOAL #1   Title Pt will demonstrate independence with final HEP and will demonstrate safe use of weights and treadmill for carryover to apartment gym    Baseline independent with weights and HEP, has not used treadmill at gym yet but has used in therapy    Time 4    Period Weeks    Status Partially Met      PT LONG TERM GOAL #3   Title Pt will increase gait velocity to >/= 3.6 ft/sec    Baseline 12/14/20: 3.51 ft/sec, improved from 3.4 ft/sec, just not to goal level    Status Partially Met      PT LONG TERM GOAL #4   Title Pt will increase FGA to >/= 25/30    Baseline 12/14/20: 25/30 scored today    Status Achieved                 Plan - 12/21/20 1309    Clinical  Impression Statement Pt has made good progress and has met 1/3 goals, making progress towards other two goals but did not reach full goal level.  Pt demonstrates decreased falls risk as indicated by increase in FGA score and gait velocity.  Pt also demonstrates independence with final HEP and using weights at home but has not used treadmill at the gym yet.  Pt has used in therapy; PT provided pt with guidelines for using treadmill at home for safety.  Pt is safe and ready for D/C.    Personal Factors and Comorbidities Comorbidity 3+;Fitness;Past/Current Experience;Time since onset of injury/illness/exacerbation    Comorbidities CHF, glaucoma, Hemiparesis of L nondominant side, shingles, HTN, HLD, tremor, short term memory loss    Examination-Activity Limitations Locomotion Level;Stairs    Examination-Participation Restrictions Community Activity    Rehab Potential Good    PT Frequency 1x / week    PT Duration 4 weeks    PT Treatment/Interventions ADLs/Self Care Home Management;Aquatic Therapy;Electrical Stimulation;DME Instruction;Gait training;Stair training;Functional mobility training;Therapeutic activities;Therapeutic exercise;Balance training;Neuromuscular re-education;Patient/family education;Orthotic Fit/Training;Energy conservation    PT Home Exercise Plan [email or web address]    Consulted and Agree with Plan of Care Patient           Patient will benefit from skilled therapeutic intervention in order to improve the following deficits and impairments:  Cardiopulmonary status limiting activity,Decreased balance,Decreased strength,Difficulty walking  Visit Diagnosis: Unsteadiness on feet  Muscle weakness (generalized)  Difficulty in walking, not elsewhere classified  Repeated falls     Problem List Patient Active Problem List   Diagnosis Date Noted  . History of stroke 11/27/2019  . Gait abnormality 11/27/2019    PHYSICAL THERAPY DISCHARGE SUMMARY  Visits from Start of Care:  13  Current functional level related to goals / functional outcomes: See LTG achievement and impression statement above.   Remaining deficits: Mild balance and gait impairments   Education / Equipment: HEP  Plan: Patient agrees to discharge.  Patient goals were partially met. Patient is being discharged due to meeting the stated rehab goals.  ?????     Rico Junker, PT, DPT 12/21/20  1:13 PM    [facility] [address] [place], [place], [address] Phone: [phone]   Fax:  [phone]  Name: Charlene Cowdrey MRN: [id number] Date of Birth: 02/14/1941

## 2020-12-21 NOTE — Patient Instructions (Addendum)
For Treadmill at Gym: -Start at slower speed: 1.0 mph - 1.2 mph -Start at shorter time: 6 to 8 minutes total  Gradually progress from there every couple of weeks.  Access Code: [email or web address] URL: [email or web address] Date: 12/21/2020 Prepared by: Misty Stanley  Exercises Tandem Walking with Counter Support - 1-2 x daily - 4-5 x weekly - 4 sets Heel Walking with Counter Support - 1-2 x daily - 4-5 x weekly - 4 sets Standing Hip Extension with Counter Support - 1 x daily - 4-5 x weekly - 2 sets - 10 reps Romberg Stance Eyes Closed on Foam Pad - 1-2 x daily - 4-5 x weekly - 2 sets - 30 hold

## 2021-01-18 ENCOUNTER — Emergency Department (HOSPITAL_COMMUNITY)
Admission: EM | Admit: 2021-01-18 | Discharge: 2021-01-18 | Disposition: A | Discharge status: Home / Self Care | Payer: Medicare Other | Attending: Emergency Medicine | Admitting: Emergency Medicine

## 2021-01-18 ENCOUNTER — Emergency Department (HOSPITAL_COMMUNITY): Payer: Medicare Other

## 2021-01-18 ENCOUNTER — Other Ambulatory Visit: Payer: Self-pay

## 2021-01-18 ENCOUNTER — Encounter (HOSPITAL_COMMUNITY): Payer: Self-pay

## 2021-01-18 DIAGNOSIS — R030 Elevated blood-pressure reading, without diagnosis of hypertension: Secondary | ICD-10-CM | POA: Diagnosis present

## 2021-01-18 DIAGNOSIS — R29810 Facial weakness: Secondary | ICD-10-CM | POA: Diagnosis not present

## 2021-01-18 DIAGNOSIS — I509 Heart failure, unspecified: Secondary | ICD-10-CM | POA: Insufficient documentation

## 2021-01-18 DIAGNOSIS — Z87891 Personal history of nicotine dependence: Secondary | ICD-10-CM | POA: Insufficient documentation

## 2021-01-18 DIAGNOSIS — R519 Headache, unspecified: Secondary | ICD-10-CM | POA: Diagnosis not present

## 2021-01-18 DIAGNOSIS — R531 Weakness: Secondary | ICD-10-CM | POA: Diagnosis not present

## 2021-01-18 DIAGNOSIS — I110 Hypertensive heart disease with heart failure: Secondary | ICD-10-CM | POA: Insufficient documentation

## 2021-01-18 DIAGNOSIS — Z7982 Long term (current) use of aspirin: Secondary | ICD-10-CM | POA: Insufficient documentation

## 2021-01-18 DIAGNOSIS — Z79899 Other long term (current) drug therapy: Secondary | ICD-10-CM | POA: Diagnosis not present

## 2021-01-18 DIAGNOSIS — I10 Essential (primary) hypertension: Secondary | ICD-10-CM

## 2021-01-18 LAB — CBC WITH DIFFERENTIAL/PLATELET
Abs Immature Granulocytes: 0.03 10*3/uL (ref 0.00–0.07)
Basophils Absolute: 0.1 10*3/uL (ref 0.0–0.1)
Basophils Relative: 1 %
Eosinophils Absolute: 0.1 10*3/uL (ref 0.0–0.5)
Eosinophils Relative: 1 %
HCT: 46.4 % (ref 39.0–52.0)
Hemoglobin: 15.3 g/dL (ref 13.0–17.0)
Immature Granulocytes: 0 %
Lymphocytes Relative: 29 %
Lymphs Abs: 2 10*3/uL (ref 0.7–4.0)
MCH: 27.2 pg (ref 26.0–34.0)
MCHC: 33 g/dL (ref 30.0–36.0)
MCV: 82.4 fL (ref 80.0–100.0)
Monocytes Absolute: 0.4 10*3/uL (ref 0.1–1.0)
Monocytes Relative: 6 %
Neutro Abs: 4.5 10*3/uL (ref 1.7–7.7)
Neutrophils Relative %: 63 %
Platelets: 247 10*3/uL (ref 150–400)
RBC: 5.63 MIL/uL (ref 4.22–5.81)
RDW: 15.6 % — ABNORMAL HIGH (ref 11.5–15.5)
WBC: 7.1 10*3/uL (ref 4.0–10.5)
nRBC: 0 % (ref 0.0–0.2)

## 2021-01-18 LAB — COMPREHENSIVE METABOLIC PANEL
ALT: 19 U/L (ref 0–44)
AST: 25 U/L (ref 15–41)
Albumin: 3.9 g/dL (ref 3.5–5.0)
Alkaline Phosphatase: 60 U/L (ref 38–126)
Anion gap: 6 (ref 5–15)
BUN: 12 mg/dL (ref 8–23)
CO2: 27 mmol/L (ref 22–32)
Calcium: 9.8 mg/dL (ref 8.9–10.3)
Chloride: 104 mmol/L (ref 98–111)
Creatinine, Ser: 1.21 mg/dL (ref 0.61–1.24)
GFR, Estimated: >60 mL/min (ref >60)
Glucose, Bld: 129 mg/dL — ABNORMAL HIGH (ref 70–99)
Potassium: 4.2 mmol/L (ref 3.5–5.1)
Sodium: 137 mmol/L (ref 135–145)
Total Bilirubin: 0.9 mg/dL (ref 0.3–1.2)
Total Protein: 7.9 g/dL (ref 6.5–8.1)

## 2021-01-18 LAB — URINALYSIS, ROUTINE W REFLEX MICROSCOPIC
Bilirubin Urine: NEGATIVE
Glucose, UA: NEGATIVE mg/dL
Hgb urine dipstick: NEGATIVE
Ketones, ur: NEGATIVE mg/dL
Leukocytes,Ua: NEGATIVE
Nitrite: NEGATIVE
Protein, ur: NEGATIVE mg/dL
Specific Gravity, Urine: 1.006 (ref 1.005–1.030)
pH: 5 (ref 5.0–8.0)

## 2021-01-18 MED ORDER — HYDRALAZINE HCL 10 MG PO TABS
10.0000 mg | ORAL_TABLET | Freq: Once | ORAL | Status: AC
  Administered 2021-01-18: 10 mg via ORAL
  Filled 2021-01-18: qty 1

## 2021-01-18 NOTE — ED Notes (Signed)
ED Provider at bedside. 

## 2021-01-18 NOTE — Discharge Instructions (Addendum)
The MRI today was normal without any signs of stroke.  The kidney function and blood counts look normal.  However blood pressure was high here.  Take your losartan tomorrow morning like usual.  Continue to avoid any salty foods.  However your doctor may need to start you on another blood pressure medication.  If you develop chest pain, shortness of breath, inability to move 1 side of your body you should return to the emergency room.  Last blood pressure was 175/90.  The highest blood pressure you had here was 200/110.  The medication you were given here will continue to work throughout the night over the next 6 hours.  You should check your blood pressure before taking your losartan in the morning.

## 2021-01-18 NOTE — ED Provider Notes (Signed)
[facility] EMERGENCY DEPARTMENT Provider Note   CSN: [id number] Arrival date & time: 01/18/21  1418     History Chief Complaint  Patient presents with   Weakness    Eric Barrett is a 80 y.o. male.  Patient is a 80 year old male with a history of hypertension, hyperlipidemia, CHF and stroke in 2019 with persistent left-sided facial droop and some leg weakness who is presenting today after seeing his doctor earlier today due to worsening weakness in the left lower extremity and in the face.  Patient reports last Wednesday he tripped over a vacuum cord and fell on his knee.  He has been getting around okay but the left leg has seemed a little bit weaker.  Today he had a significant right sided temporal headache and made an appointment to see his doctor.  While at the [place] office they noted the left-sided deficits and with the ongoing severe headache and hypertension in the office they felt that he needed further evaluation.  Patient reports in the meantime the headache is now completely resolved.  He continues to have the weakness in the left side but denies any pain in his knee now.  He has no visual changes.  He has taken his medications this morning but also reports he takes medicine in the evening.  He has no chest pain, shortness of breath, abdominal pain, nausea or vomiting.  No recent fevers.  The history is provided by the patient, a relative and medical records.  Weakness     Past Medical History:  Diagnosis Date   CHF (congestive heart failure) (HCC)    Glaucoma    Hemiparesis of left nondominant side (HCC)    History of shingles    HLD (hyperlipidemia)    Hypertension    Stroke [facility])    Tremor     Patient Active Problem List   Diagnosis Date Noted   History of stroke 11/27/2019   Gait abnormality 11/27/2019    Past Surgical History:  Procedure Laterality Date   No past surgery         Family History  Problem Relation Age of Onset    Heart attack Mother    Heart attack Father     Social History   Tobacco Use   Smoking status: Former    Pack years: 0.00   Smokeless tobacco: Never  Substance Use Topics   Alcohol use: Not Currently   Drug use: Never    Home Medications Prior to Admission medications   Medication Sig Start Date End Date Taking? Authorizing Provider  amLODipine (NORVASC) 5 MG tablet Take 5 mg by mouth daily.    [provider]  ASPIRIN 81 PO Take 2 tablets by mouth daily.     [provider]  Cholecalciferol (VITAMIN D3) 25 MCG (1000 UT) CAPS Take 1,000 Units by mouth daily.    [provider]  multivitamin-lutein (OCUVITE-LUTEIN) CAPS capsule Take 1 capsule by mouth daily.    [provider]  Omega-3 Fatty Acids (FISH OIL) 1000 MG CAPS Take 1 capsule by mouth daily.    [provider]  rosuvastatin (CRESTOR) 20 MG tablet Take 20 mg by mouth daily.    [provider]  Travoprost, BAK Free, (TRAVATAN) 0.004 % SOLN ophthalmic solution Place 1 drop into both eyes at bedtime.    [provider]    Allergies    Patient has no known allergies.  Review of Systems   Review of Systems  Neurological:  Positive for weakness.  All other systems reviewed and are negative.  Physical Exam Updated Vital Signs BP (!) 201/98   Pulse 79   Temp 98.4 F (36.9 C) (Oral)   Resp 16   Ht 5\' 8"  (1.727 m)   Wt 72.6 kg   SpO2 98%   BMI 24.33 kg/m   Physical Exam Vitals and nursing note reviewed.  Constitutional:      General: He is not in acute distress.    Appearance: Normal appearance. He is well-developed and normal weight.  HENT:     Head: Normocephalic and atraumatic.     Comments: No temporal pain Eyes:     General: No visual field deficit.    Conjunctiva/sclera: Conjunctivae normal.     Pupils: Pupils are equal, round, and reactive to light.  Cardiovascular:     Rate and Rhythm: Normal rate and regular rhythm.     Pulses: Normal  pulses.     Heart sounds: No murmur heard. Pulmonary:     Effort: Pulmonary effort is normal. No respiratory distress.     Breath sounds: Normal breath sounds. No wheezing or rales.  Abdominal:     General: There is no distension.     Palpations: Abdomen is soft.     Tenderness: There is no abdominal tenderness. There is no guarding or rebound.  Musculoskeletal:        General: No tenderness. Normal range of motion.     Cervical back: Normal range of motion and neck supple. No tenderness.     Right lower leg: No edema.     Left lower leg: No edema.  Skin:    General: Skin is warm and dry.     Findings: No erythema or rash.  Neurological:     Mental Status: He is alert and oriented to person, place, and time.     Cranial Nerves: Facial asymmetry present. No dysarthria.     Sensory: Sensation is intact.     Motor: Weakness present.     Coordination: Coordination is intact.     Comments: Left sided facial droop.  4/5 strength in the left lower ext with minimal pronator drift and shaking with holding the leg up due to effort.  RLE with no pronator drift but minimal shaking after holding up longer.  No pronator drift in the hands and normal coordination.  Psychiatric:        Mood and Affect: Mood normal.        Behavior: Behavior normal.    ED Results / Procedures / Treatments   Labs (all labs ordered are listed, but only abnormal results are displayed) Labs Reviewed  CBC WITH DIFFERENTIAL/PLATELET - Abnormal; Notable for the following components:      Result Value   RDW 15.6 (*)    All other components within normal limits  COMPREHENSIVE METABOLIC PANEL - Abnormal; Notable for the following components:   Glucose, Bld 129 (*)    All other components within normal limits  URINALYSIS, ROUTINE W REFLEX MICROSCOPIC    EKG EKG Interpretation  Date/Time:  Tuesday January 18 2021 14:29:32 EDT Ventricular Rate:  77 PR Interval:  160 QRS Duration: 82 QT Interval:  366 QTC  Calculation: 414 R Axis:   69 Text Interpretation: Sinus rhythm with Premature atrial complexes Nonspecific T wave abnormality No previous tracing Confirmed by Blanchie Dessert [phone]) on 01/18/2021 6:22:08 PM  Radiology CT Head Wo Contrast  Result Date: 01/18/2021 CLINICAL DATA:  Dizziness, nonspecific. Additional history provided:  History of stroke, subjective left-sided leg weakness. EXAM: CT HEAD WITHOUT CONTRAST TECHNIQUE: Contiguous axial images were obtained from the base of the skull through the vertex without intravenous contrast. COMPARISON:  No pertinent prior exams available for comparison. FINDINGS: Brain: Mild generalized parenchymal atrophy. Chronic appearing lacunar infarcts within the anterior limb of left internal capsule, left thalamus and right corona radiata/caudate nucleus. Age-indeterminate lacunar infarcts within the anterior and posterior limbs of the right internal capsule. Background mild patchy and ill-defined hypoattenuation within the cerebral white matter, nonspecific but compatible with chronic small vessel ischemic disease. There is no acute intracranial hemorrhage. No demarcated cortical infarct. No extra-axial fluid collection. No evidence of intracranial mass. No midline shift. Vascular: No hyperdense vessel.  Atherosclerotic calcifications. Skull: Normal. Negative for fracture or focal lesion. Sinuses/Orbits: Visualized orbits show no acute finding. Small mucous retention cysts within the bilateral maxillary sinuses. Mild scattered paranasal sinus mucosal thickening within the bilateral ethmoid air cells. Small volume frothy secretions within the right frontal sinus. IMPRESSION: No acute intracranial hemorrhage or acute demarcated cortical infarction. Age-indeterminate lacunar infarcts within the right internal capsule. A brain MRI may be obtained for further evaluation, as clinically warranted. Chronic appearing lacunar infarcts within the left internal capsule, left  thalamus and right corona radiata/caudate nucleus. Background mild generalized parenchymal atrophy and cerebral white matter chronic small vessel ischemic disease. Paranasal sinus disease as described. Electronically Signed   By: Kellie Simmering DO   On: 01/18/2021 15:39    Procedures Procedures   Medications Ordered in ED Medications  hydrALAZINE (APRESOLINE) tablet 10 mg (10 mg Oral Given 01/18/21 1842)    ED Course  I have reviewed the triage vital signs and the nursing notes.  Pertinent labs & imaging results that were available during my care of the patient were reviewed by me and considered in my medical decision making (see chart for details).    MDM Rules/Calculators/A&P                          Elderly male with prior history of stroke and left-sided deficits presenting today from his doctor's office due to a headache in the right side of his head and worsening weakness on the left side.  Patient does report the headache is gone.  He is well-appearing on exam but does have some noted weakness in the left side.  He has no other complaints at this time.  Patient is hypertensive here despite taking his morning blood pressure medication.  Labs including CBC and CMP are within normal limits.  Head CT showed no acute intracranial hemorrhage or acute demarcated cortical infarct however there was age-indeterminate lacunar infarcts within the right internal capsule and a brain MRI was recommended for further evaluation.  There was also chronic appearing lacunar infarcts within the left internal capsule left thalamus and right corona radiata.  Patient given oral hydralazine due to persistent hypotension and unclear exactly what his home medications are at this time.  MRI is pending.  Will swallow screen and allow patient to eat and drink.  10:37 PM MRI is neg for acute issues.  Labs are reassuring.  Pt BP remained elevated and he had a total of 20mg  of hydralazine with BP improved mildly from  190/110 to 175/90.  Discussed with patient and his wife that he needs to take his losartan in the morning but he should call his PCP Dr. Tamala Julian at [facility] for further treatment and possible addition of  blood pressure medication.  He does not check it regularly at home and is unsure what its been running recently.  MDM   Amount and/or Complexity of Data Reviewed Clinical lab tests: ordered and reviewed Tests in the radiology section of CPT: ordered and reviewed Tests in the medicine section of CPT: ordered and reviewed Independent visualization of images, tracings, or specimens: yes     Final Clinical Impression(s) / ED Diagnoses Final diagnoses:  Hypertension, unspecified type    Rx / DC Orders ED Discharge Orders     None        Blanchie Dessert, MD 01/18/21 2244

## 2021-01-18 NOTE — ED Notes (Signed)
Pt going to MRI

## 2021-01-18 NOTE — ED Provider Notes (Signed)
Emergency Medicine Provider Triage Evaluation Note  Eric Barrett , a 80 y.o. male  was evaluated in triage.  Pt complains of weakness. Pt worried that he has had a stroke.  Pt reports he fellt like this in the past when he had a stroke Review of Systems  Positive: Negative: cough or fever  Physical Exam  BP (!) 167/88 (BP Location: Right Arm)   Pulse 77   Temp 98.7 F (37.1 C) (Oral)   Resp 14   SpO2 97%  Gen:   Awake, no distress   Resp:  Normal effort  MSK:   Moves extremities without difficulty  Other:   Medical Decision Making  Medically screening exam initiated at 2:51 PM.  Appropriate orders placed.  Worthy Boschert was informed that the remainder of the evaluation will be completed by another provider, this initial triage assessment does not replace that evaluation, and the importance of remaining in the ED until their evaluation is complete.     Fransico Meadow, [place] 01/18/21 1453    Carmin Muskrat, MD 01/20/21 [phone]

## 2021-01-18 NOTE — ED Triage Notes (Signed)
Pt BIB [facility] from [place]. Pt with hx of stroke, subjective left side leg weakness. Pt w/chronic left side weakness and left side facial droop. Felt normal when he went bed at 2000 last night,, awoke this am with weakness.   No abnormal deficits noted   HP 180/100 HR 90 CBG 156

## 2021-01-27 ENCOUNTER — Encounter: Payer: Self-pay | Admitting: Neurology

## 2021-01-27 ENCOUNTER — Ambulatory Visit (INDEPENDENT_AMBULATORY_CARE_PROVIDER_SITE_OTHER): Payer: Medicare Other | Admitting: Neurology

## 2021-01-27 VITALS — BP 143/84 | HR 68 | Ht 68.0 in | Wt 184.2 lb

## 2021-01-27 DIAGNOSIS — Z8673 Personal history of transient ischemic attack (TIA), and cerebral infarction without residual deficits: Secondary | ICD-10-CM

## 2021-01-27 DIAGNOSIS — R413 Other amnesia: Secondary | ICD-10-CM | POA: Insufficient documentation

## 2021-01-27 MED ORDER — DONEPEZIL HCL 10 MG PO TABS: ORAL_TABLET | ORAL | 5 refills | Status: DC

## 2021-01-27 NOTE — Patient Instructions (Signed)
Please continue to keep a check on the BP Stay on aspirin  Start Aricept taking 5 mg at bedtime for 1 month, then take 10 mg at bedtime  Check labs today See you back in 6 months

## 2021-01-27 NOTE — Progress Notes (Signed)
PATIENT: Eric Barrett DOB: [birth year]  REASON FOR VISIT: follow up HISTORY FROM: patient HISTORY Eric Barrett is a 80 year old male, seen in request by his primary care nurse practitioner Dustin Folks A for evaluation of bilateral lower extremity tremor, initial evaluation was on November 27, 2019.   I have reviewed and summarized the referring note from the referring physician.  He has past medical history of hypertension, hyperlipidemia, tobacco use, reported history of stroke in 2019, presented with acute onset dysarthria, left upper and lower extremity weakness in 2019, was treated at [place], moved to [place] in September 2020, lives in apartment, continue complains of mild left leg weakness, tremorous unsteady sensation when bearing weight, denies significant gait abnormality   He was seen by cardiologist recently, her cardiogram showed ejection fraction 60% in 0272, chronic diastolic heart failure, trace edema, ordered 30 days cardiac monitoring   Update February 26, 2020 SS: Here today to follow-up on sensation of leg weakness, mostly in the left and tremor in the leg, usually when getting in and out of the car.  Has significantly improved, as he is exercising and walking more, but sensation is still present.  Describes this post his stroke in 2019 in [place]. Onset stroke at that time presented with generalized weakness, confusion.  Remains on aspirin.  Has follow-up with cardiology, MRA chest showed mild aortic dilation.  MRI of the brain was ordered but is yet to be completed. No weakness of the left arm reported. Cardiac monitor study showed no significant heart rhythm abnormalities.  Update January 27, 2021 SS: Never had MRI of the brain from our office, but had 01/18/21 when in ER for HTN, no acute intercranial abnormality, showed chronic microvascular ischemia and generalized volume loss. BP elevated 190/110. Remains on aspirin 81 mg daily.   Went to ER 01/18/21 reporting worsening left  sided weakness, following a fall over cord 1 week ago onto the left side. Reported left sided headache, facial drooping.   Left leg shakes getting in and out of the car. Headache resolved. No further left sided weakness. They are watching his diet, low sodium. BP runs around 130-140's /80  Short term memory isn't great, worsened since the stroke, has gotten worse last 6 months. Is limited historian today. Can repeat himself to his wife. Doesn't drive a car. Needs help with medications and appointments. MMSE 28/30 today.   REVIEW OF SYSTEMS: Out of a complete 14 system review of symptoms, the patient complains only of the following symptoms, and all other reviewed systems are negative.  See HPI  ALLERGIES: No Known Allergies  HOME MEDICATIONS: Outpatient Medications Prior to Visit  Medication Sig Dispense Refill   ASPIRIN 81 PO Take 81 mg by mouth daily.     Cholecalciferol (VITAMIN D3) 25 MCG (1000 UT) CAPS Take 1,000 Units by mouth daily.     docusate sodium (COLACE) 100 MG capsule Take 100 mg by mouth daily.     losartan (COZAAR) 100 MG tablet Take 100 mg by mouth at bedtime.     multivitamin-lutein (OCUVITE-LUTEIN) CAPS capsule Take 1 capsule by mouth daily.     rosuvastatin (CRESTOR) 20 MG tablet Take 20 mg by mouth daily.     tamsulosin (FLOMAX) 0.4 MG CAPS capsule Take 0.4 mg by mouth daily.     Travoprost, BAK Free, (TRAVATAN) 0.004 % SOLN ophthalmic solution Place 1 drop into both eyes at bedtime.     amLODipine (NORVASC) 5 MG tablet Take 5 mg by  mouth at bedtime. (Patient not taking: Reported on 01/27/2021)     [place] 1000 MG TABS Take 1,000 mg by mouth daily.     Omega-3 Fatty Acids (FISH OIL) 1000 MG CAPS Take 1,000 mg by mouth daily.     No facility-administered medications prior to visit.    PAST MEDICAL HISTORY: Past Medical History:  Diagnosis Date   CHF (congestive heart failure) (HCC)    Glaucoma    Hemiparesis of left nondominant side (HCC)    History of  shingles    HLD (hyperlipidemia)    Hypertension    Stroke ([place])    Tremor     PAST SURGICAL HISTORY: Past Surgical History:  Procedure Laterality Date   No past surgery      FAMILY HISTORY: Family History  Problem Relation Age of Onset   Heart attack Mother    Heart attack Father     SOCIAL HISTORY: Social History   Socioeconomic History   Marital status: Married    Spouse name: [name]   Number of children: 0   Years of education: 12   Highest education level: High school graduate  Occupational History   Occupation: Retired  Tobacco Use   Smoking status: Former    Pack years: 0.00   Smokeless tobacco: Never  Substance and Sexual Activity   Alcohol use: Not Currently   Drug use: Never   Sexual activity: Not on file  Other Topics Concern   Not on file  Social History Narrative   Lives at home with his wife.   Right-handed.   One cup coffee per day.   Social Determinants of Health   Financial Resource Strain: Not on file  Food Insecurity: Not on file  Transportation Needs: Not on file  Physical Activity: Not on file  Stress: Not on file  Social Connections: Not on file  Intimate Partner Violence: Not on file   PHYSICAL EXAM  Vitals:   01/27/21 1323  BP: (!) 143/84  Pulse: 68  Weight: 184 lb 3.2 oz (83.6 kg)  Height: 5\' 8"  (1.727 m)    Body mass index is 28.01 kg/m.  Generalized: Well developed, in no acute distress  MMSE - Mini Mental State Exam 01/27/2021  Orientation to time 5  Orientation to Place 4  Registration 3  Attention/ Calculation 5  Attention/Calculation-comments refused  math  Recall 2  Language- name 2 objects 2  Language- repeat 1  Language- follow 3 step command 3  Language- read & follow direction 1  Write a sentence 1  Copy design 1  Total score 28    Neurological examination  Mentation: Alert oriented to time, place, history is provided by the patient and his wife. Follows all commands speech and language  fluent Cranial nerve II-XII: Pupils were equal round reactive to light. Extraocular movements were full, visual field were full on confrontational test. Facial sensation and strength were normal. Head turning and shoulder shrug  were normal and symmetric. Mild left sided facial droop at baseline, very slight, wife thinks looks normal today.  Motor: The motor testing reveals 5 over 5 strength of all 4 extremities. Good symmetric motor tone is noted throughout. No tremor noted.  No left-sided weakness. Sensory: Sensory testing is intact to soft touch on all 4 extremities. No evidence of extinction is noted.  Coordination: Cerebellar testing reveals good finger-nose-finger and heel-to-shin bilaterally.  Gait and station: Gait is steady, slightly wide-based Reflexes: Deep tendon reflexes are symmetric and normal bilaterally (Dr. Krista Blue  previously noted hyperreflexia on the left upper and lower)  DIAGNOSTIC DATA (LABS, IMAGING, TESTING) - I reviewed patient records, labs, notes, testing and imaging myself where available.  Lab Results  Component Value Date   WBC 7.1 01/18/2021   HGB 15.3 01/18/2021   HCT 46.4 01/18/2021   MCV 82.4 01/18/2021   PLT 247 01/18/2021      Component Value Date/Time   NA 137 01/18/2021 1451   NA 141 12/24/2019 1052   K 4.2 01/18/2021 1451   CL 104 01/18/2021 1451   CO2 27 01/18/2021 1451   GLUCOSE 129 (H) 01/18/2021 1451   BUN 12 01/18/2021 1451   BUN 17 12/24/2019 1052   CREATININE 1.21 01/18/2021 1451   CALCIUM 9.8 01/18/2021 1451   PROT 7.9 01/18/2021 1451   ALBUMIN 3.9 01/18/2021 1451   ALBUMIN 4.5 11/18/2019 1447   AST 25 01/18/2021 1451   ALT 19 01/18/2021 1451   ALKPHOS 60 01/18/2021 1451   BILITOT 0.9 01/18/2021 1451   GFRNONAA >60 01/18/2021 1451   GFRAA 57 (L) 12/24/2019 1052   Lab Results  Component Value Date   CHOL 118 11/18/2019   HDL 52 11/18/2019   LDLCALC 54 11/18/2019   TRIG 49 11/18/2019   CHOLHDL 2.3 11/18/2019   No results found  for: HGBA1C No results found for: VITAMINB12 No results found for: TSH    ASSESSMENT AND PLAN 80 y.o. year old male  has a past medical history of CHF (congestive heart failure) ([place]), Glaucoma, Hemiparesis of left nondominant side ([place]), History of shingles, HLD (hyperlipidemia), Hypertension, Stroke ([place]), and Tremor. here with:  1.  Reported history of stroke -History of stroke in [place], reported post stroke, leg weakness, mostly the leg but could be both, tremor in left leg, symptoms most notable when getting in and out of cars -MRI of the brain in June 2022 showed no acute abnormality, did show findings of chronic microvascular ischemia, generalized volume loss -Continue aspirin 81 mg daily -Need to keep good control of blood pressure, ideally around 130/90, recent ER visit, BP > 334 systolic, needs close PCP follow-up to manage vascular risk factors (aging, HTN, HLD) -Cardiac monitor study did not show significant heart rhythm abnormalities  2.  Mild memory disturbance -MMSE 28/30 today -Check for reversible causes of memory loss with laboratory evaluation -Start Aricept working up to 10 mg at bedtime  -Follow-up in 6 months or sooner if needed  Orders Placed This Encounter  Procedures   RPR   Vitamin B12   TSH   Sedimentation rate   I spent 35 minutes of face-to-face and non-face-to-face time with patient.  This included previsit chart review, lab review, study review, order entry, reviewing ER note, discussing medications, memory, management, and follow-up.  Butler Denmark, AGNP-C, DNP 01/27/2021, 2:45 PM [facility] [address] [place], [place] [address] [phone]

## 2021-01-28 LAB — SEDIMENTATION RATE: Sed Rate: 10 mm/hr (ref 0–30)

## 2021-01-28 LAB — RPR: RPR Ser Ql: NONREACTIVE

## 2021-01-28 LAB — TSH: TSH: 0.732 u[IU]/mL (ref 0.450–4.500)

## 2021-01-28 LAB — VITAMIN B12: Vitamin B-12: 1020 pg/mL (ref 232–1245)

## 2021-01-31 ENCOUNTER — Telehealth: Payer: Self-pay

## 2021-01-31 NOTE — Telephone Encounter (Signed)
Pt verified by name and DOB,  normal results given per provider, pt voiced understanding all question answered. °

## 2021-01-31 NOTE — Telephone Encounter (Signed)
-----   Message from Suzzanne Cloud, NP sent at 01/31/2021  6:00 AM EDT ----- Please let the patient know, blood work was unremarkable. No etiology of memory concerns were found.

## 2021-07-09 NOTE — Progress Notes (Signed)
Chart reviewed, agree above plan ?

## 2021-07-25 ENCOUNTER — Ambulatory Visit: Payer: Medicare Other | Admitting: Neurology

## 2021-08-02 ENCOUNTER — Encounter: Payer: Self-pay | Admitting: Neurology

## 2021-08-02 ENCOUNTER — Ambulatory Visit (INDEPENDENT_AMBULATORY_CARE_PROVIDER_SITE_OTHER): Payer: Medicare Other | Admitting: Neurology

## 2021-08-02 VITALS — BP 120/64 | HR 61 | Ht 68.0 in | Wt 168.0 lb

## 2021-08-02 DIAGNOSIS — I6381 Other cerebral infarction due to occlusion or stenosis of small artery: Secondary | ICD-10-CM | POA: Diagnosis not present

## 2021-08-02 DIAGNOSIS — F03B18 Unspecified dementia, moderate, with other behavioral disturbance: Secondary | ICD-10-CM | POA: Diagnosis not present

## 2021-08-02 DIAGNOSIS — F03B0 Unspecified dementia, moderate, without behavioral disturbance, psychotic disturbance, mood disturbance, and anxiety: Secondary | ICD-10-CM | POA: Insufficient documentation

## 2021-08-02 MED ORDER — MEMANTINE HCL 10 MG PO TABS: 10.0000 mg | ORAL_TABLET | Freq: Two times a day (BID) | ORAL | 11 refills | Status: DC

## 2021-08-02 NOTE — Progress Notes (Addendum)
HISTORY FROM: Eric Barrett is a 80 year old male, seen in request by his primary care nurse practitioner Fatima Sanger A for evaluation of bilateral lower extremity tremor, initial evaluation was on November 27, 2019.   I have reviewed and summarized the referring note from the referring physician.  He has past medical history of hypertension, hyperlipidemia, tobacco use, reported history of stroke in 2019, presented with acute onset dysarthria, left upper and lower extremity weakness in 2019, was treated at [place], moved to [place] in September 2020, lives in apartment, continue complains of mild left leg weakness, tremorous unsteady sensation when bearing weight, denies significant gait abnormality   He was seen by cardiologist recently, her cardiogram showed ejection fraction 60% in 2019, chronic diastolic heart failure, trace edema, ordered 30 days cardiac monitoring   UPDATE Aug 02 2021: He is accompanied by his wife at today's clinical visit, he reported worsening short-term memory loss, when he walked to a different room, he would often forgot why he was there, also complains of decreased appetite, with some weight loss, get up few times each night to use bathroom, MoCA examination 14/30 today,  I personally reviewed MRI of the brain without contrast in June 2022, no acute abnormality, generalized atrophy, small vessel disease  Laboratory evaluation showed normal TSH, B12, RPR, ESR, CMP, glucose of 129, CBC hemoglobin of 15.6  \REVIEW OF SYSTEMS: Out of a complete 14 system review of symptoms, the patient complains only of the following symptoms, and all other reviewed systems are negative.  See HPI  ALLERGIES: No Known Allergies  HOME MEDICATIONS: Outpatient Medications Prior to Visit  Medication Sig Dispense Refill   amLODipine (NORVASC) 10 MG tablet Take 10 mg by mouth daily.     ASPIRIN 81 PO Take 81 mg by mouth daily.     Cholecalciferol (VITAMIN D3) 25 MCG (1000 UT)  CAPS Take 1,000 Units by mouth daily.     docusate sodium (COLACE) 100 MG capsule Take 100 mg by mouth daily.     donepezil (ARICEPT) 10 MG tablet Start taking 1/2 tablet at bedtime for 1 month, then increase to 1 full tablet at bedtime 30 tablet 5   losartan (COZAAR) 100 MG tablet Take 100 mg by mouth at bedtime.     multivitamin-lutein (OCUVITE-LUTEIN) CAPS capsule Take 1 capsule by mouth daily.     rosuvastatin (CRESTOR) 20 MG tablet Take 20 mg by mouth daily.     tamsulosin (FLOMAX) 0.4 MG CAPS capsule Take 0.4 mg by mouth daily.     Travoprost, BAK Free, (TRAVATAN) 0.004 % SOLN ophthalmic solution Place 1 drop into both eyes at bedtime.     amLODipine (NORVASC) 5 MG tablet Take 5 mg by mouth at bedtime.     No facility-administered medications prior to visit.    PAST MEDICAL HISTORY: Past Medical History:  Diagnosis Date   CHF (congestive heart failure) (HCC)    Glaucoma    Hemiparesis of left nondominant side (HCC)    History of shingles    HLD (hyperlipidemia)    Hypertension    Stroke (HCC)    Tremor     PAST SURGICAL HISTORY: Past Surgical History:  Procedure Laterality Date   No past surgery      FAMILY HISTORY: Family History  Problem Relation Age of Onset   Heart attack Mother    Heart attack Father     SOCIAL HISTORY: Social History   Socioeconomic History   Marital status: Married  Spouse name: Lelon Frohlich   Number of children: 0   Years of education: 12   Highest education level: High school graduate  Occupational History   Occupation: Retired  Tobacco Use   Smoking status: Former   Smokeless tobacco: Never  Substance and Sexual Activity   Alcohol use: Not Currently   Drug use: Never   Sexual activity: Not on file  Other Topics Concern   Not on file  Social History Narrative   Lives at home with his wife.   Right-handed.   One cup coffee per day.   Social Determinants of Health   Financial Resource Strain: Not on file  Food Insecurity: Not  on file  Transportation Needs: Not on file  Physical Activity: Not on file  Stress: Not on file  Social Connections: Not on file  Intimate Partner Violence: Not on file   PHYSICAL EXAM  Vitals:   08/02/21 1304  BP: 120/64  Pulse: 61  Weight: 168 lb (76.2 kg)  Height: $Remove'5\' 8"'eRFAnrX$  (1.727 m)    Body mass index is 25.54 kg/m.  Generalized: Well developed, in no acute distress  MMSE - Mini Mental State Exam 01/27/2021  Orientation to time 5  Orientation to Place 4  Registration 3  Attention/ Calculation 5  Attention/Calculation-comments refused  math  Recall 2  Language- name 2 objects 2  Language- repeat 1  Language- follow 3 step command 3  Language- read & follow direction 1  Write a sentence 1  Copy design 1  Total score 28   Montreal Cognitive Assessment  08/02/2021  Visuospatial/ Executive (0/5) 2  Naming (0/3) 3  Attention: Read list of digits (0/2) 1  Attention: Read list of letters (0/1) 1  Attention: Serial 7 subtraction starting at 100 (0/3) 0  Language: Repeat phrase (0/2) 0  Language : Fluency (0/1) 1  Abstraction (0/2) 0  Delayed Recall (0/5) 0  Orientation (0/6) 5  Total 13  Adjusted Score (based on education) 14    Neurological examination  Mentation: Alert oriented to casual conversation  Cranial nerve II-XII: Pupils were equal round reactive to light. Extraocular movements were full, visual field were full on confrontational test. Facial sensation and strength were normal. Head turning and shoulder shrug  were normal and symmetric. Mild left sided facial droop at baseline, very slight, wife thinks looks normal today.  Motor: Normal strength in 4 extremities, no significant rigidity or bradykinesia Sensory: Sensory testing is intact to soft touch  Coordination: No evidence of limb or truncal ataxia, Deep tendon reflexes were present and symmetric Gait and station: Need push-up to get up from seated position, cautious  DIAGNOSTIC DATA (LABS, IMAGING,  TESTING) - I reviewed patient records, labs, notes, testing and imaging myself where available.  Lab Results  Component Value Date   WBC 7.1 01/18/2021   HGB 15.3 01/18/2021   HCT 46.4 01/18/2021   MCV 82.4 01/18/2021   PLT 247 01/18/2021      Component Value Date/Time   NA 137 01/18/2021 1451   NA 141 12/24/2019 1052   K 4.2 01/18/2021 1451   CL 104 01/18/2021 1451   CO2 27 01/18/2021 1451   GLUCOSE 129 (H) 01/18/2021 1451   BUN 12 01/18/2021 1451   BUN 17 12/24/2019 1052   CREATININE 1.21 01/18/2021 1451   CALCIUM 9.8 01/18/2021 1451   PROT 7.9 01/18/2021 1451   ALBUMIN 3.9 01/18/2021 1451   ALBUMIN 4.5 11/18/2019 1447   AST 25 01/18/2021 1451   ALT  19 01/18/2021 1451   ALKPHOS 60 01/18/2021 1451   BILITOT 0.9 01/18/2021 1451   GFRNONAA >60 01/18/2021 1451   GFRAA 57 (L) 12/24/2019 1052   Lab Results  Component Value Date   CHOL 118 11/18/2019   HDL 52 11/18/2019   LDLCALC 54 11/18/2019   TRIG 49 11/18/2019   CHOLHDL 2.3 11/18/2019   No results found for: HGBA1C Lab Results  Component Value Date   VITAMINB12 1,020 01/27/2021   Lab Results  Component Value Date   TSH 0.732 01/27/2021    ASSESSMENT AND PLAN 80 y.o. 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 Ph.D.  [facility] [place], [place] [address] Phone: [phone] Fax:      [phone]

## 2021-08-03 ENCOUNTER — Other Ambulatory Visit: Payer: Self-pay | Admitting: *Deleted

## 2021-08-03 MED ORDER — DONEPEZIL HCL 10 MG PO TABS: 10.0000 mg | ORAL_TABLET | Freq: Every day | ORAL | 3 refills | Status: DC

## 2021-08-25 ENCOUNTER — Emergency Department (HOSPITAL_COMMUNITY)
Admission: EM | Admit: 2021-08-25 | Discharge: 2021-08-26 | Disposition: A | Discharge status: Home / Self Care | Payer: Medicare HMO | Attending: Emergency Medicine | Admitting: Emergency Medicine

## 2021-08-25 ENCOUNTER — Other Ambulatory Visit: Payer: Self-pay

## 2021-08-25 ENCOUNTER — Encounter (HOSPITAL_COMMUNITY): Payer: Self-pay

## 2021-08-25 DIAGNOSIS — Z7982 Long term (current) use of aspirin: Secondary | ICD-10-CM | POA: Insufficient documentation

## 2021-08-25 DIAGNOSIS — R310 Gross hematuria: Secondary | ICD-10-CM | POA: Diagnosis not present

## 2021-08-25 DIAGNOSIS — N401 Enlarged prostate with lower urinary tract symptoms: Secondary | ICD-10-CM | POA: Diagnosis not present

## 2021-08-25 DIAGNOSIS — N289 Disorder of kidney and ureter, unspecified: Secondary | ICD-10-CM | POA: Diagnosis not present

## 2021-08-25 DIAGNOSIS — N400 Enlarged prostate without lower urinary tract symptoms: Secondary | ICD-10-CM

## 2021-08-25 DIAGNOSIS — R319 Hematuria, unspecified: Secondary | ICD-10-CM | POA: Diagnosis present

## 2021-08-25 LAB — CBC WITH DIFFERENTIAL/PLATELET
Abs Immature Granulocytes: 0.03 10*3/uL (ref 0.00–0.07)
Basophils Absolute: 0 10*3/uL (ref 0.0–0.1)
Basophils Relative: 0 %
Eosinophils Absolute: 0 10*3/uL (ref 0.0–0.5)
Eosinophils Relative: 0 %
HCT: 43.4 % (ref 39.0–52.0)
Hemoglobin: 14.1 g/dL (ref 13.0–17.0)
Immature Granulocytes: 0 %
Lymphocytes Relative: 18 %
Lymphs Abs: 1.7 10*3/uL (ref 0.7–4.0)
MCH: 27.3 pg (ref 26.0–34.0)
MCHC: 32.5 g/dL (ref 30.0–36.0)
MCV: 84.1 fL (ref 80.0–100.0)
Monocytes Absolute: 1.1 10*3/uL — ABNORMAL HIGH (ref 0.1–1.0)
Monocytes Relative: 12 %
Neutro Abs: 6.3 10*3/uL (ref 1.7–7.7)
Neutrophils Relative %: 70 %
Platelets: 166 10*3/uL (ref 150–400)
RBC: 5.16 MIL/uL (ref 4.22–5.81)
RDW: 16.1 % — ABNORMAL HIGH (ref 11.5–15.5)
WBC: 9.2 10*3/uL (ref 4.0–10.5)
nRBC: 0 % (ref 0.0–0.2)

## 2021-08-25 LAB — I-STAT CHEM 8, ED
BUN: 24 mg/dL — ABNORMAL HIGH (ref 8–23)
Calcium, Ion: 1.22 mmol/L (ref 1.15–1.40)
Chloride: 102 mmol/L (ref 98–111)
Creatinine, Ser: 1.8 mg/dL — ABNORMAL HIGH (ref 0.61–1.24)
Glucose, Bld: 138 mg/dL — ABNORMAL HIGH (ref 70–99)
HCT: 45 % (ref 39.0–52.0)
Hemoglobin: 15.3 g/dL (ref 13.0–17.0)
Potassium: 4.5 mmol/L (ref 3.5–5.1)
Sodium: 139 mmol/L (ref 135–145)
TCO2: 26 mmol/L (ref 22–32)

## 2021-08-25 NOTE — ED Triage Notes (Addendum)
Patient BIB [facility] from home. Patient started having blood come out of his penis today. He also stated he was shaking more than normal. Patient was told he has an enlarged prostate and just started taking Finasteride today.

## 2021-08-25 NOTE — ED Notes (Signed)
Patient has 112mL after the bladder scan.

## 2021-08-26 ENCOUNTER — Encounter (HOSPITAL_COMMUNITY): Payer: Self-pay | Admitting: Emergency Medicine

## 2021-08-26 ENCOUNTER — Emergency Department (HOSPITAL_COMMUNITY): Payer: Medicare HMO

## 2021-08-26 LAB — URINALYSIS, ROUTINE W REFLEX MICROSCOPIC
Bacteria, UA: NONE SEEN
Bilirubin Urine: NEGATIVE
Glucose, UA: NEGATIVE mg/dL
Ketones, ur: NEGATIVE mg/dL
Leukocytes,Ua: NEGATIVE
Nitrite: NEGATIVE
Protein, ur: NEGATIVE mg/dL
RBC / HPF: >50 RBC/hpf — ABNORMAL HIGH (ref 0–5)
Specific Gravity, Urine: 1.014 (ref 1.005–1.030)
pH: 5 (ref 5.0–8.0)

## 2021-08-26 MED ORDER — SODIUM CHLORIDE 0.9 % IV BOLUS
500.0000 mL | Freq: Once | INTRAVENOUS | Status: AC
  Administered 2021-08-26: 500 mL via INTRAVENOUS

## 2021-08-26 NOTE — ED Provider Notes (Signed)
[facility] DEPT Provider Note   CSN: [id number] Arrival date & time: 08/25/21  2320     History  Chief Complaint  Patient presents with   Penile Bleeding    Eric Barrett is a 81 y.o. male.  The history is provided by the patient and the spouse.  Hematuria This is a new problem. The current episode started yesterday. The problem occurs constantly. The problem has not changed since onset.Pertinent negatives include no chest pain, no abdominal pain, no headaches and no shortness of breath. Nothing aggravates the symptoms. Nothing relieves the symptoms. He has tried nothing for the symptoms. The treatment provided no relief.  No dysuria, just started proscar.  No f/c/r.      Home Medications Prior to Admission medications   Medication Sig Start Date End Date Taking? Authorizing Provider  amLODipine (NORVASC) 10 MG tablet Take 10 mg by mouth daily. 07/08/21  Yes [provider]  ASPIRIN 81 PO Take 81 mg by mouth daily.   Yes [provider]  Cholecalciferol (VITAMIN D3) 25 MCG (1000 UT) CAPS Take 1,000 Units by mouth daily.   Yes [provider]  docusate sodium (COLACE) 100 MG capsule Take 100 mg by mouth daily.   Yes [provider]  donepezil (ARICEPT) 10 MG tablet Take 1 tablet (10 mg total) by mouth at bedtime. Start taking 1/2 tablet at bedtime for 1 month, then increase to 1 full tablet at bedtime Patient taking differently: Take 10 mg by mouth at bedtime. 08/03/21  Yes [name], MD  finasteride (PROSCAR) 5 MG tablet Take 5 mg by mouth daily. 08/24/21  Yes [provider]  losartan (COZAAR) 100 MG tablet Take 100 mg by mouth at bedtime. 10/29/20  Yes [provider]  memantine (NAMENDA) 10 MG tablet Take 1 tablet (10 mg total) by mouth 2 (two) times daily. 08/02/21  Yes [name], MD  multivitamin-lutein [facility]) CAPS capsule Take 1 capsule by mouth daily.   Yes [provider]   rosuvastatin (CRESTOR) 20 MG tablet Take 20 mg by mouth daily.   Yes [provider]  tamsulosin (FLOMAX) 0.4 MG CAPS capsule Take 0.4 mg by mouth daily. 10/29/20  Yes [provider]  Travoprost, BAK Free, (TRAVATAN) 0.004 % SOLN ophthalmic solution Place 1 drop into both eyes at bedtime.   Yes [provider]      Allergies    Patient has no known allergies.    Review of Systems   Review of Systems  Constitutional:  Negative for fever.  HENT:  Negative for facial swelling.   Eyes:  Negative for redness.  Respiratory:  Negative for shortness of breath.   Cardiovascular:  Negative for chest pain.  Gastrointestinal:  Negative for abdominal pain.  Genitourinary:  Positive for hematuria. Negative for difficulty urinating, dysuria and flank pain.  Musculoskeletal:  Negative for neck stiffness.  Skin:  Negative for rash.  Neurological:  Negative for headaches.  Psychiatric/Behavioral:  Negative for agitation.   All other systems reviewed and are negative.  Physical Exam Updated Vital Signs BP (!) 142/75    Pulse 84    Temp 98.8 F (37.1 C) (Oral)    Resp 15    Ht 5\' 8"  (1.727 m)    Wt 76.2 kg    SpO2 99%    BMI 25.54 kg/m  Physical Exam Vitals and nursing note reviewed. Exam conducted with a chaperone present.  Constitutional:      General: He is  not in acute distress.    Appearance: Normal appearance.  HENT:     Head: Atraumatic.     Nose: Nose normal.  Eyes:     Conjunctiva/sclera: Conjunctivae normal.     Pupils: Pupils are equal, round, and reactive to light.  Cardiovascular:     Rate and Rhythm: Normal rate and regular rhythm.     Pulses: Normal pulses.     Heart sounds: Normal heart sounds.  Pulmonary:     Effort: Pulmonary effort is normal.     Breath sounds: Normal breath sounds.  Abdominal:     General: Abdomen is flat. Bowel sounds are normal.     Palpations: Abdomen is soft.     Tenderness: There is no abdominal tenderness. There is  no guarding.  Genitourinary:    Penis: Normal.   Musculoskeletal:        General: Normal range of motion.     Cervical back: Normal range of motion and neck supple.  Skin:    General: Skin is warm and dry.     Capillary Refill: Capillary refill takes less than 2 seconds.  Neurological:     General: No focal deficit present.     Mental Status: He is alert and oriented to person, place, and time.     Deep Tendon Reflexes: Reflexes normal.  Psychiatric:        Mood and Affect: Mood normal.        Behavior: Behavior normal.    ED Results / Procedures / Treatments   Labs (all labs ordered are listed, but only abnormal results are displayed) Labs Reviewed  CBC WITH DIFFERENTIAL/PLATELET - Abnormal; Notable for the following components:      Result Value   RDW 16.1 (*)    Monocytes Absolute 1.1 (*)    All other components within normal limits  URINALYSIS, ROUTINE W REFLEX MICROSCOPIC - Abnormal; Notable for the following components:   Hgb urine dipstick LARGE (*)    RBC / HPF >50 (*)    All other components within normal limits  I-STAT CHEM 8, ED - Abnormal; Notable for the following components:   BUN 24 (*)    Creatinine, Ser 1.80 (*)    Glucose, Bld 138 (*)    All other components within normal limits  URINE CULTURE    EKG None  Radiology CT Renal Stone Study  Result Date: 08/26/2021 CLINICAL DATA:  Blood coming from penis for 1 day. EXAM: CT ABDOMEN AND PELVIS WITHOUT CONTRAST TECHNIQUE: Multidetector CT imaging of the abdomen and pelvis was performed following the standard protocol without IV contrast. RADIATION DOSE REDUCTION: This exam was performed according to the departmental dose-optimization program which includes automated exposure control, adjustment of the mA and/or kV according to patient size and/or use of iterative reconstruction technique. COMPARISON:  None. FINDINGS: Lower chest: Mild posterior right basilar scarring and/or atelectasis is seen. Hepatobiliary:  No focal liver abnormality is seen. No gallstones, gallbladder wall thickening, or biliary dilatation. Pancreas: Unremarkable. No pancreatic ductal dilatation or surrounding inflammatory changes. Spleen: Normal in size without focal abnormality. Adrenals/Urinary Tract: Mild to moderate severity diffuse bilateral adrenal gland enlargement is seen. Kidneys are normal in size, without renal calculi or hydronephrosis. A 1.8 cm cyst is seen within the mid right kidney. An additional 0.8 cm cyst is noted within the medial aspect of the mid to upper left kidney. The urinary bladder is moderately distended and otherwise unremarkable. Stomach/Bowel: Stomach is within normal limits. Appendix appears normal. Stool  is seen throughout the large bowel. No evidence of bowel wall thickening, distention, or inflammatory changes. Vascular/Lymphatic: Aortic atherosclerosis. No enlarged abdominal or pelvic lymph nodes. Reproductive: Moderate severity prostate gland enlargement is seen. Other: No abdominal wall hernia or abnormality. No abdominopelvic ascites. Musculoskeletal: Degenerative changes are seen throughout the lumbar spine IMPRESSION: 1. No acute or active process within the abdomen or pelvis. 2. Moderate severity prostate gland enlargement. Recommend correlation with PSA values. 3. Bilateral renal cysts. 4. Aortic atherosclerosis. Aortic Atherosclerosis (ICD10-I70.0). Electronically Signed   By: Virgina Norfolk M.D.   On: 08/26/2021 03:21    Procedures Procedures    Medications Ordered in ED Medications  sodium chloride 0.9 % bolus 500 mL (500 mLs Intravenous New Bag/Given 08/26/21 0240)    ED Course/ Medical Decision Making/ A&P                           Medical Decision Making On DOAC and just started proscar for BPH.  No f/c/r.  No clots, urinating well in the department   Problems Addressed: Gross hematuria: acute illness or injury    Details: likely secondary to DOAC Renal insufficiency: acute  illness or injury    Details: increased CR in the past 6 months wil refer to nephrology  Amount and/or Complexity of Data Reviewed Independent Historian: spouse Labs: ordered.    Details: Creatinine 1.8, new gross hematuria without infection on urinalysis Radiology: ordered. Decision-making details documented in ED Course.    Details: No stones on CT by me  Risk Risk Details: No indication for foley catheter or admission.  Hematuria will need cystoscopy by urology.  Continue proscar, strict return precautions given for fevers, inability to urinate or any concerns.      Final Clinical Impression(s) / ED Diagnoses Final diagnoses:  Renal insufficiency  Gross hematuria  Prostate hypertrophy   Return for intractable cough, coughing up blood, fevers > 100.4 unrelieved by medication, shortness of breath, intractable vomiting, chest pain, shortness of breath, weakness, numbness, changes in speech, facial asymmetry, abdominal pain, passing out, Inability to tolerate liquids or food, cough, altered mental status or any concerns. No signs of systemic illness or infection. The patient is nontoxic-appearing on exam and vital signs are within normal limits.  I have reviewed the triage vital signs and the nursing notes. Pertinent labs & imaging results that were available during my care of the patient were reviewed by me and considered in my medical decision making (see chart for details). After history, exam, and medical workup I feel the patient has been appropriately medically screened and is safe for discharge home. Pertinent diagnoses were discussed with the patient. Patient was given return precautions.  Rx / DC Orders ED Discharge Orders     None         Kina Shiffman, MD 08/26/21 [phone]

## 2021-08-26 NOTE — ED Notes (Signed)
Patient and spouse are upset because they thought they were able to get transportation home from the hospital. Patient does not meet criteria for PTAR because he is able to walk. Patient provided and ordered walker and educated on how to use it. Dr. Randal Buba told patient that he will need to get his primary care doctor to pursue home care/rehab placement.

## 2021-08-27 LAB — URINE CULTURE
Culture: NO GROWTH
Special Requests: NORMAL

## 2021-09-04 DIAGNOSIS — I130 Hypertensive heart and chronic kidney disease with heart failure and stage 1 through stage 4 chronic kidney disease, or unspecified chronic kidney disease: Secondary | ICD-10-CM | POA: Insufficient documentation

## 2021-12-27 ENCOUNTER — Telehealth: Payer: Self-pay | Admitting: Neurology

## 2021-12-27 NOTE — Telephone Encounter (Signed)
Pt's wife states the donepezil (ARICEPT) 10 MG tablet, current dose is no longer working.  Wife is asking for a call to discuss an increase.

## 2021-12-27 NOTE — Telephone Encounter (Addendum)
I spoke to the patient's wife. Reports he has continued to have steady worsening of memory. He is taking donepezil '10mg'$ , one tab QHS and memantine '10mg'$ , one tab BID. She felt the medication worked well at the initial start of therapy. She understands that they may slow the progression of memory loss but not prevent it completely. She would like for him to be re-evaluated.   He  was also recently seen by his PCP who noted the worsening memory. His wife says she was instructed to contact our office for earlier follow up. Appt has been scheduled on 01/19/22.   His last Holly was 14/30 (on 08/02/21).

## 2022-01-06 ENCOUNTER — Other Ambulatory Visit: Payer: Self-pay

## 2022-01-06 ENCOUNTER — Emergency Department (HOSPITAL_COMMUNITY): Payer: Medicare HMO

## 2022-01-06 ENCOUNTER — Encounter (HOSPITAL_COMMUNITY): Payer: Self-pay

## 2022-01-06 ENCOUNTER — Emergency Department (HOSPITAL_COMMUNITY)
Admission: EM | Admit: 2022-01-06 | Discharge: 2022-01-06 | Disposition: A | Discharge status: Home / Self Care | Payer: Medicare HMO | Attending: Emergency Medicine | Admitting: Emergency Medicine

## 2022-01-06 DIAGNOSIS — R55 Syncope and collapse: Secondary | ICD-10-CM | POA: Diagnosis not present

## 2022-01-06 DIAGNOSIS — Z7982 Long term (current) use of aspirin: Secondary | ICD-10-CM | POA: Diagnosis not present

## 2022-01-06 DIAGNOSIS — R944 Abnormal results of kidney function studies: Secondary | ICD-10-CM | POA: Insufficient documentation

## 2022-01-06 DIAGNOSIS — Z79899 Other long term (current) drug therapy: Secondary | ICD-10-CM | POA: Diagnosis not present

## 2022-01-06 DIAGNOSIS — R81 Glycosuria: Secondary | ICD-10-CM | POA: Insufficient documentation

## 2022-01-06 LAB — BASIC METABOLIC PANEL
Anion gap: 5 (ref 5–15)
BUN: 25 mg/dL — ABNORMAL HIGH (ref 8–23)
CO2: 26 mmol/L (ref 22–32)
Calcium: 9.2 mg/dL (ref 8.9–10.3)
Chloride: 109 mmol/L (ref 98–111)
Creatinine, Ser: 1.73 mg/dL — ABNORMAL HIGH (ref 0.61–1.24)
GFR, Estimated: 39 mL/min — ABNORMAL LOW (ref >60)
Glucose, Bld: 120 mg/dL — ABNORMAL HIGH (ref 70–99)
Potassium: 4.1 mmol/L (ref 3.5–5.1)
Sodium: 140 mmol/L (ref 135–145)

## 2022-01-06 LAB — URINALYSIS, ROUTINE W REFLEX MICROSCOPIC
Bilirubin Urine: NEGATIVE
Glucose, UA: 50 mg/dL — AB
Hgb urine dipstick: NEGATIVE
Ketones, ur: NEGATIVE mg/dL
Leukocytes,Ua: NEGATIVE
Nitrite: NEGATIVE
Protein, ur: NEGATIVE mg/dL
Specific Gravity, Urine: 1.014 (ref 1.005–1.030)
pH: 5 (ref 5.0–8.0)

## 2022-01-06 LAB — CBC
HCT: 37.7 % — ABNORMAL LOW (ref 39.0–52.0)
Hemoglobin: 12.5 g/dL — ABNORMAL LOW (ref 13.0–17.0)
MCH: 27.7 pg (ref 26.0–34.0)
MCHC: 33.2 g/dL (ref 30.0–36.0)
MCV: 83.4 fL (ref 80.0–100.0)
Platelets: 171 10*3/uL (ref 150–400)
RBC: 4.52 MIL/uL (ref 4.22–5.81)
RDW: 15.3 % (ref 11.5–15.5)
WBC: 6 10*3/uL (ref 4.0–10.5)
nRBC: 0 % (ref 0.0–0.2)

## 2022-01-06 LAB — CBG MONITORING, ED: Glucose-Capillary: 112 mg/dL — ABNORMAL HIGH (ref 70–99)

## 2022-01-06 MED ORDER — HYDROMORPHONE HCL 1 MG/ML IJ SOLN: 1.0000 mg | Freq: Once | INTRAMUSCULAR | Status: DC

## 2022-01-06 NOTE — Discharge Instructions (Signed)
  Follow-up with your primary care doctor for further evaluation and treatment of the episode of syncope.

## 2022-01-06 NOTE — ED Triage Notes (Signed)
Pt BIB EMS. Pt was waiting on a park bench with his wife after grocery shopping and wife noticed he was passed out and drooling for about 5 minutes. Upon EMS arrival pt was A&O x4 and clammy. Pt endorses generalized weakness now, but felt fine prior to syncope. Hx of dementia.   BP 130/76 HR 56 99% RA CBG 131  20G RAC

## 2022-01-06 NOTE — ED Notes (Signed)
Discharge paperwork viewed with pt. And wife. Pt. Verbalized understanding of paperwork. Pt. Left ED with wife.

## 2022-01-06 NOTE — ED Provider Notes (Signed)
[facility] DEPT Provider Note   CSN: [id number] Arrival date & time: 01/06/22  1502     History  Chief Complaint  Patient presents with   Loss of Consciousness    Eric Barrett is a 81 y.o. male.  HPI Patient presenting for evaluation of syncopal episode, which occurred today while he sitting on a park bench with his wife.  He did not have associated tremor or seizure, but she feels he was unconscious for 5 minutes.  She called EMS who also found him not responsive.  He was transferred here without specific treatment.  He apparently woke up after EMS arrived.  He had another episode of syncope, several months ago that was not evaluated by his PCP.  He has been otherwise healthy.  No recent illnesses including fever, vomiting, dysuria, urinary retention or diarrhea.    Home Medications Prior to Admission medications   Medication Sig Start Date End Date Taking? Authorizing Provider  amLODipine (NORVASC) 10 MG tablet Take 10 mg by mouth daily. 07/08/21   [provider]  ASPIRIN 81 PO Take 81 mg by mouth daily.    [provider]  Cholecalciferol (VITAMIN D3) 25 MCG (1000 UT) CAPS Take 1,000 Units by mouth daily.    [provider]  docusate sodium (COLACE) 100 MG capsule Take 100 mg by mouth daily.    [provider]  donepezil (ARICEPT) 10 MG tablet Take 1 tablet (10 mg total) by mouth at bedtime. Start taking 1/2 tablet at bedtime for 1 month, then increase to 1 full tablet at bedtime Patient taking differently: Take 10 mg by mouth at bedtime. 08/03/21   [name], MD  finasteride (PROSCAR) 5 MG tablet Take 5 mg by mouth daily. 08/24/21   [provider]  losartan (COZAAR) 100 MG tablet Take 100 mg by mouth at bedtime. 10/29/20   [provider]  memantine (NAMENDA) 10 MG tablet Take 1 tablet (10 mg total) by mouth 2 (two) times daily. 08/02/21   [name], MD  multivitamin-lutein [facility])  CAPS capsule Take 1 capsule by mouth daily.    [provider]  rosuvastatin (CRESTOR) 20 MG tablet Take 20 mg by mouth daily.    [provider]  tamsulosin (FLOMAX) 0.4 MG CAPS capsule Take 0.4 mg by mouth daily. 10/29/20   [provider]  Travoprost, BAK Free, (TRAVATAN) 0.004 % SOLN ophthalmic solution Place 1 drop into both eyes at bedtime.    [provider]      Allergies    Patient has no known allergies.    Review of Systems   Review of Systems  Physical Exam Updated Vital Signs BP 113/61   Pulse (!) 57   Temp 98.1 F (36.7 C)   Resp 15   SpO2 94%  Physical Exam Vitals and nursing note reviewed.  Constitutional:      Appearance: He is well-developed. He is not ill-appearing.  HENT:     Head: Normocephalic and atraumatic.     Right Ear: External ear normal.     Left Ear: External ear normal.  Eyes:     Conjunctiva/sclera: Conjunctivae normal.     Pupils: Pupils are equal, round, and reactive to light.  Neck:     Trachea: Phonation normal.  Cardiovascular:     Rate and Rhythm: Normal rate and regular rhythm.     Heart sounds: Normal heart sounds.  Pulmonary:     Effort: Pulmonary effort is normal.  Breath sounds: Normal breath sounds.  Abdominal:     Palpations: Abdomen is soft.     Tenderness: There is no abdominal tenderness.  Musculoskeletal:        General: Normal range of motion.     Cervical back: Normal range of motion and neck supple.  Skin:    General: Skin is warm and dry.  Neurological:     Mental Status: He is alert and oriented to person, place, and time.     Cranial Nerves: No cranial nerve deficit.     Sensory: No sensory deficit.     Motor: No abnormal muscle tone.     Coordination: Coordination normal.     Comments: No dysarthria or aphasia or nystagmus.  Normal finger-to-nose, bilaterally.  No ataxia.  Patient was able to walk easily  Psychiatric:        Behavior: Behavior normal.        Thought  Content: Thought content normal.        Judgment: Judgment normal.    ED Results / Procedures / Treatments   Labs (all labs ordered are listed, but only abnormal results are displayed) Labs Reviewed  BASIC METABOLIC PANEL - Abnormal; Notable for the following components:      Result Value   Glucose, Bld 120 (*)    BUN 25 (*)    Creatinine, Ser 1.73 (*)    GFR, Estimated 39 (*)    All other components within normal limits  CBC - Abnormal; Notable for the following components:   Hemoglobin 12.5 (*)    HCT 37.7 (*)    All other components within normal limits  URINALYSIS, ROUTINE W REFLEX MICROSCOPIC - Abnormal; Notable for the following components:   Glucose, UA 50 (*)    All other components within normal limits  CBG MONITORING, ED - Abnormal; Notable for the following components:   Glucose-Capillary 112 (*)    All other components within normal limits    EKG EKG Interpretation  Date/Time:  Friday January 06 2022 15:23:32 EDT Ventricular Rate:  56 PR Interval:  164 QRS Duration: 93 QT Interval:  431 QTC Calculation: 416 R Axis:   73 Text Interpretation: Sinus rhythm Minimal ST elevation, anterior leads since last tracing no significant change Confirmed by Daleen Bo [phone]) on 01/06/2022 4:06:27 PM  Radiology CT Head Wo Contrast  Result Date: 01/06/2022 CLINICAL DATA:  Stroke, follow up EXAM: CT HEAD WITHOUT CONTRAST TECHNIQUE: Contiguous axial images were obtained from the base of the skull through the vertex without intravenous contrast. RADIATION DOSE REDUCTION: This exam was performed according to the departmental dose-optimization program which includes automated exposure control, adjustment of the mA and/or kV according to patient size and/or use of iterative reconstruction technique. COMPARISON:  January 18, 2021. FINDINGS: Brain: No evidence of acute infarction, hemorrhage, hydrocephalus, extra-axial collection or mass lesion/mass effect. Similar scattered white matter and  deep gray hypodensities, compatible with chronic microvascular ischemic disease. Vascular: No hyperdense vessel identified. Skull: No acute fracture. Sinuses/Orbits: Minimal paranasal sinus mucosal thickening. No acute orbital findings. Other: No mastoid effusions. IMPRESSION: 1. No evidence of acute intracranial abnormality. 2. Similar chronic microvascular ischemic disease. Electronically Signed   By: Margaretha Sheffield M.D.   On: 01/06/2022 16:44    Procedures Procedures    Medications Ordered in ED Medications - No data to display  ED Course/ Medical Decision Making/ A&P  Medical Decision Making Syncope without prodrome and recovery without recurrence.  Another episode occurred several months ago.  No seizure disorder.  No known cardiac dysrhythmias.  Evaluation ordered to assess for occult disorders.  Problems Addressed: Syncope, unspecified syncope type: acute illness or injury that poses a threat to life or bodily functions  Amount and/or Complexity of Data Reviewed Independent Historian: spouse    Details: Wife at bedside gives history Labs: ordered.    Details: CBC, metabolic panel, urinalysis-normal except glucose slightly elevated in urineAnd blood,BUN high, creatinine high, GFR Radiology: ordered and independent interpretation performed.    Details: CT head, no acute abnormalities ECG/medicine tests: ordered and independent interpretation performed.    Details: Cardiac monitor-normal sinus rhythm  Risk Decision regarding hospitalization. Risk Details: Patient presenting for evaluation of syncope.  No specific cause.  Patient evaluated for acute cardiac and CNS disorders with negative findings.  No evidence for cardiac arrhythmia in the ED.  Screening labs are reassuring.  No other acute abnormalities found.  He does not require hospitalization or additional ED evaluation at this time.  Recommend PCP follow-up with consideration for referral to  cardiology neurology           Final Clinical Impression(s) / ED Diagnoses Final diagnoses:  Syncope, unspecified syncope type    Rx / DC Orders ED Discharge Orders     None         Daleen Bo, MD 01/06/22 2352

## 2022-01-11 ENCOUNTER — Telehealth: Payer: Self-pay | Admitting: Neurology

## 2022-01-11 NOTE — Telephone Encounter (Signed)
I spoke to the his wife. Reports the patient had a syncope event on 01/06/22. He went to the ED. Says he has been feeling dizzy, light-headed for a while now. She is not sure it's the medication. He has been on both donepezil and memantine since 07/2021. He has an appt at the [place] on 01/12/22, his PCP on 01/13/22 and our office 01/19/22. She is going to have him continue the medication for now, until his evaluations have been completed.

## 2022-01-11 NOTE — Telephone Encounter (Signed)
Pt's wife is asking if Dr Krista Blue will ok pt holding off on donepezil (ARICEPT) 10 MG tablet & memantine (NAMENDA) 10 MG tablet until pt's appointment on 06-15.  Reason: wife states pt told her when he takes the memantine it makes him  jittery, nervous, light headed, feels like he would fall out. Wife states she called pt's PCP and the suggestion was made pt stop both medications.  Please call pt's wife.

## 2022-01-19 ENCOUNTER — Encounter: Payer: Self-pay | Admitting: Neurology

## 2022-01-19 ENCOUNTER — Ambulatory Visit: Payer: Medicare HMO | Admitting: Neurology

## 2022-01-19 VITALS — BP 138/67 | HR 65 | Ht 68.0 in | Wt 168.0 lb

## 2022-01-19 DIAGNOSIS — F0390 Unspecified dementia without behavioral disturbance: Secondary | ICD-10-CM | POA: Diagnosis not present

## 2022-01-19 DIAGNOSIS — R404 Transient alteration of awareness: Secondary | ICD-10-CM | POA: Insufficient documentation

## 2022-01-19 DIAGNOSIS — I6381 Other cerebral infarction due to occlusion or stenosis of small artery: Secondary | ICD-10-CM

## 2022-01-19 MED ORDER — DIVALPROEX SODIUM ER 500 MG PO TB24: 500.0000 mg | ORAL_TABLET | Freq: Every day | ORAL | 11 refills | Status: DC

## 2022-01-19 NOTE — Progress Notes (Signed)
ASSESSMENT AND PLAN 81 y.o. year old male   3 recurrent passing out episode, most recent 1 leading to hospital admission on January 06, 2022  Most suggestive of complex partial seizure  Add on Depakote ER 500 mg daily  EEG  Dementia  MoCA examination 14 out of 30  Laboratory evaluation showed no treatable etiology  MRI of the brain without contrast January 18, 2021 showed no acute abnormality, moderate small vessel disease, generalized atrophy,  Most likely central nervous system degenerative disorder, with vascular component,  Could not tolerate Aricept 10 mg daily and Namenda 10 mg twice a day, now stopped taking it  History of stroke:  Vascular risk factor of aging, hypertension, hyperlipidemia,  On aspirin 81 mg daily  MRI of the brain showed small vessel disease involving left basal ganglia, right thalamus,  Return to clinic with nurse practitioner in 6 months, he is no longer driving     DIAGNOSTIC DATA (LABS, IMAGING, TESTING) - I reviewed patient records, labs, notes, testing and imaging myself where available.  HISTORY: Eric Barrett is a 81 year old male, seen in request by his primary care nurse practitioner Dustin Folks A for evaluation of bilateral lower extremity tremor, initial evaluation was on November 27, 2019.   I have reviewed and summarized the referring note from the referring physician.  He has past medical history of hypertension, hyperlipidemia, tobacco use, reported history of stroke in 2019, presented with acute onset dysarthria, left upper and lower extremity weakness in 2019, was treated at [place], moved to [place] in September 2020, lives in apartment, continue complains of mild left leg weakness, tremorous unsteady sensation when bearing weight, denies significant gait abnormality   He was seen by cardiologist recently, her cardiogram showed ejection fraction 60% in 3354, chronic diastolic heart failure, trace edema, ordered 30 days cardiac monitoring    UPDATE Aug 02 2021: He is accompanied by his wife at today's clinical visit, he reported worsening short-term memory loss, when he walked to a different room, he would often forgot why he was there, also complains of decreased appetite, with some weight loss, get up few times each night to use bathroom, MoCA examination 14/30 today,  I personally reviewed MRI of the brain without contrast in June 2022, no acute abnormality, generalized atrophy, small vessel disease  Laboratory evaluation showed normal TSH, B12, RPR, ESR, CMP, glucose of 129, CBC hemoglobin of 15.6  UPDATE June 15th 2023: He is accompanied by his wife at today's visit, she witnessed patient's passing out on January 06, 2022, there was sitting on the bench talking, patient was noted to become quiet, had his sunglasses on, 5 minutes later, when the transportation,, she was not able to woke him up, his body was tense, then he slumped over drooling, out of his mouth and nose, 911 was called, by the time paramedic came, he came around, but confused  This is his third episode, first episode was in January 2023 at home, he was confused, blank look on his face, she helped him set, then he went limp, lasting for 5 minutes, with postevent confusion,  Another episode in 2019, his eyes rolled back, body shaking, stiff lasting for few minutes,    At the emergency room, I personally reviewed CT head, no acute abnormality, small vessel disease  Laboratory evaluation showed hemoglobin 12.5, creatinine 1.73  He has been wearing cardiac monitoring for 2 weeks REVIEW OF SYSTEMS: Out of a complete 14 system review of symptoms, the patient  complains only of the following symptoms, and all other reviewed systems are negative.  See HPI  PHYSICAL EXAM  Vitals:   01/19/22 1258  BP: 138/67  Pulse: 65  Weight: 168 lb (76.2 kg)  Height: $Remove'5\' 8"'DKLdAwj$  (1.727 m)    Body mass index is 25.54 kg/m.  Generalized: Well developed, in no acute distress      PHYSICAL EXAMNIATION:  Gen: NAD, conversant, well nourised, well groomed                     Cardiovascular: Regular rate rhythm, no peripheral edema, warm, nontender. Eyes: Conjunctivae clear without exudates or hemorrhage Neck: Supple, no carotid bruits. Pulmonary: Clear to auscultation bilaterally   NEUROLOGICAL EXAM:  MENTAL STATUS: Speech/cognition: Quiet, rely on his wife to provide history, cooperative on examination  CRANIAL NERVES: CN II: Visual fields are full to confrontation.  Pupils are round equal and briskly reactive to light. CN III, IV, VI: extraocular movement are normal. No ptosis. CN V: Facial sensation is intact to pinprick in all 3 divisions bilaterally. Corneal responses are intact.  CN VII: Left lower nasolabial fold CN VIII: Hearing is normal to casual conversation CN IX, X: Palate elevates symmetrically. Phonation is normal. CN XI: Head turning and shoulder shrug are intact   MOTOR: Mild fixation of left arm on rapid rotating movement  REFLEXES: Hypoactive and symmetric  SENSORY: Intact to light touch,   COORDINATION: Rapid alternating movements and fine finger movements are intact. There is no dysmetria on finger-to-nose and heel-knee-shin.    GAIT/STANCE: Need push-up to get up from seated position, cautious    ALLERGIES: No Known Allergies  HOME MEDICATIONS: Outpatient Medications Prior to Visit  Medication Sig Dispense Refill   amLODipine (NORVASC) 10 MG tablet Take 10 mg by mouth daily.     ASPIRIN 81 PO Take 81 mg by mouth daily.     Cholecalciferol (VITAMIN D3) 25 MCG (1000 UT) CAPS Take 1,000 Units by mouth daily.     docusate sodium (COLACE) 100 MG capsule Take 100 mg by mouth daily.     donepezil (ARICEPT) 10 MG tablet Take 1 tablet (10 mg total) by mouth at bedtime. Start taking 1/2 tablet at bedtime for 1 month, then increase to 1 full tablet at bedtime (Patient taking differently: Take 10 mg by mouth at bedtime.) 90 tablet 3    finasteride (PROSCAR) 5 MG tablet Take 5 mg by mouth daily.     losartan (COZAAR) 100 MG tablet Take 100 mg by mouth at bedtime.     memantine (NAMENDA) 10 MG tablet Take 1 tablet (10 mg total) by mouth 2 (two) times daily. 60 tablet 11   multivitamin-lutein (OCUVITE-LUTEIN) CAPS capsule Take 1 capsule by mouth daily.     MYRBETRIQ 25 MG TB24 tablet Take 25 mg by mouth daily.     rosuvastatin (CRESTOR) 20 MG tablet Take 20 mg by mouth daily.     tamsulosin (FLOMAX) 0.4 MG CAPS capsule Take 0.4 mg by mouth daily.     Travoprost, BAK Free, (TRAVATAN) 0.004 % SOLN ophthalmic solution Place 1 drop into both eyes at bedtime.     No facility-administered medications prior to visit.    PAST MEDICAL HISTORY: Past Medical History:  Diagnosis Date   CHF (congestive heart failure) (HCC)    Glaucoma    Hemiparesis of left nondominant side (HCC)    History of shingles    HLD (hyperlipidemia)    Hypertension    Stroke (  [place])    Tremor     PAST SURGICAL HISTORY: Past Surgical History:  Procedure Laterality Date   No past surgery      FAMILY HISTORY: Family History  Problem Relation Age of Onset   Heart attack Mother    Heart attack Father     SOCIAL HISTORY: Social History   Socioeconomic History   Marital status: Married    Spouse name: [name]   Number of children: 0   Years of education: 12   Highest education level: High school graduate  Occupational History   Occupation: Retired  Tobacco Use   Smoking status: Former   Smokeless tobacco: Never  Substance and Sexual Activity   Alcohol use: Not Currently   Drug use: Never   Sexual activity: Not on file  Other Topics Concern   Not on file  Social History Narrative   Lives at home with his wife.   Right-handed.   One cup coffee per day.   Social Determinants of Health   Financial Resource Strain: Not on file  Food Insecurity: Not on file  Transportation Needs: Not on file  Physical Activity: Not on file  Stress: Not  on file  Social Connections: Not on file  Intimate Partner Violence: Not on file    [name], M.D. Ph.D.  [facility] [place], [place] [address] Phone: [phone] Fax:      [phone]

## 2022-01-24 ENCOUNTER — Ambulatory Visit: Payer: Medicare HMO | Admitting: Neurology

## 2022-01-24 DIAGNOSIS — R4182 Altered mental status, unspecified: Secondary | ICD-10-CM | POA: Diagnosis not present

## 2022-01-24 DIAGNOSIS — F0390 Unspecified dementia without behavioral disturbance: Secondary | ICD-10-CM

## 2022-01-24 DIAGNOSIS — I6381 Other cerebral infarction due to occlusion or stenosis of small artery: Secondary | ICD-10-CM

## 2022-01-24 DIAGNOSIS — R404 Transient alteration of awareness: Secondary | ICD-10-CM

## 2022-01-27 ENCOUNTER — Telehealth: Payer: Self-pay | Admitting: Neurology

## 2022-02-15 ENCOUNTER — Telehealth: Payer: Self-pay

## 2022-02-15 NOTE — Telephone Encounter (Signed)
NOTES SCANNED TO REFERRAL 

## 2022-02-25 NOTE — Progress Notes (Unsigned)
Cardiology Clinic Note   Patient Name: Eric Barrett Date of Encounter: 02/27/2022  Primary Care Provider:  [name], FNP Primary Cardiologist:  Donato Heinz, MD  Patient Profile    Eric Barrett 81 year old male presents to the clinic today for follow-up evaluation of his diastolic CHF.  Past Medical History    Past Medical History:  Diagnosis Date   CHF (congestive heart failure) (HCC)    Glaucoma    Hemiparesis of left nondominant side (HCC)    History of shingles    HLD (hyperlipidemia)    Hypertension    Stroke ([place])    Tremor    Past Surgical History:  Procedure Laterality Date   No past surgery      Allergies  No Known Allergies  History of Present Illness    Eric Barrett has a PMH of CHF, glaucoma, hemiparesis of left side, shingles, HLD, HTN, CVA, syncope and tremor.  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  MRA showed 36 mm aortic dilation.  He was seen by Dr. Gardiner Rhyme 04/09/2020.  During that time he reported doing well.  He denied chest pain and dyspnea.  He reported 1 episode of lightheadedness but denied syncope.  He reported rare palpitations lasting for up to 1 minute which could be several months apart.  He denied lower extremity swelling.  He reported that he was trying to start walking more.  He had not been checking his blood pressure at home.  He was seen in the emergency department on 01/06/2022.  He arrived via EMS following a syncopal event while sitting on a park bench.  His EKG was unremarkable his BMP showed stable creatinine and electrolytes his CBC showed hemoglobin of 12.5, his head CT showed no acute intercranial abnormalities and similar chronic microvascular ischemic disease, his UA showed no signs of infection.  He was discharged in stable condition and instructed to follow-up with his PCP.  He presents to the clinic today for follow-up  evaluation states he has not had any further episodes of syncope since being seen in the emergency department in June.  He followed up with neurology and was placed on Depakote.  His PCP ordered a cardiac event monitor.  I do not have results to review today.  We reviewed cardiac arrhythmias and heart electrophysiology.  He and his wife expressed understanding.  I reviewed the importance of maintaining p.o. hydration and changing positions slowly.  I will request lab work and cardiac event monitor results from PCP.  We will plan follow-up in 3 to 4 months.  Today he denies chest pain, shortness of breath, lower extremity edema, fatigue, palpitations, melena, hematuria, hemoptysis, diaphoresis, weakness, presyncope, syncope, orthopnea, and PND.   Home Medications    Prior to Admission medications   Medication Sig Start Date End Date Taking? Authorizing Provider  amLODipine (NORVASC) 10 MG tablet Take 10 mg by mouth daily. 07/08/21   [provider]  ASPIRIN 81 PO Take 81 mg by mouth daily.    [provider]  Cholecalciferol (VITAMIN D3) 25 MCG (1000 UT) CAPS Take 1,000 Units by mouth daily.    [provider]  divalproex (DEPAKOTE ER) 500 MG 24 hr tablet Take 1 tablet (500 mg total) by mouth at bedtime. 01/19/22   [name], MD  docusate sodium (COLACE) 100 MG capsule Take 100 mg by mouth daily.    [provider]  finasteride (PROSCAR) 5 MG tablet Take 5 mg by mouth daily. 08/24/21   [provider]  losartan (COZAAR) 100 MG tablet Take 100 mg by mouth at bedtime. 10/29/20   [provider]  multivitamin-lutein (OCUVITE-LUTEIN) CAPS capsule Take 1 capsule by mouth daily.    [provider]  MYRBETRIQ 25 MG TB24 tablet Take 25 mg by mouth daily. 11/02/21   [provider]  rosuvastatin (CRESTOR) 20 MG tablet Take 20 mg by mouth daily.    [provider]  tamsulosin (FLOMAX) 0.4 MG CAPS capsule Take 0.4 mg by mouth daily.  10/29/20   [provider]  Travoprost, BAK Free, (TRAVATAN) 0.004 % SOLN ophthalmic solution Place 1 drop into both eyes at bedtime.    [provider]    Family History    Family History  Problem Relation Age of Onset   Heart attack Mother    Heart attack Father    He indicated that his mother is deceased. He indicated that his father is deceased.  Social History    Social History   Socioeconomic History   Marital status: Married    Spouse name: [name]   Number of children: 0   Years of education: 12   Highest education level: High school graduate  Occupational History   Occupation: Retired  Tobacco Use   Smoking status: Former   Smokeless tobacco: Never  Substance and Sexual Activity   Alcohol use: Not Currently   Drug use: Never   Sexual activity: Not on file  Other Topics Concern   Not on file  Social History Narrative   Lives at home with his wife.   Right-handed.   One cup coffee per day.   Social Determinants of Health   Financial Resource Strain: Not on file  Food Insecurity: Not on file  Transportation Needs: Not on file  Physical Activity: Not on file  Stress: Not on file  Social Connections: Not on file  Intimate Partner Violence: Not on file     Review of Systems    General:  No chills, fever, night sweats or weight changes.  Cardiovascular:  No chest pain, dyspnea on exertion, edema, orthopnea, palpitations, paroxysmal nocturnal dyspnea. Dermatological: No rash, lesions/masses Respiratory: No cough, dyspnea Urologic: No hematuria, dysuria Abdominal:   No nausea, vomiting, diarrhea, bright red blood per rectum, melena, or hematemesis Neurologic:  No visual changes, wkns, changes in mental status. All other systems reviewed and are otherwise negative except as noted above.  Physical Exam    VS:  BP 124/60   Pulse 70   Ht '5\' 7"'$  (1.702 m)   Wt 184 lb 9.6 oz (83.7 kg)   SpO2 96%   BMI 28.91 kg/m  , BMI Body mass index is  28.91 kg/m. GEN: Well nourished, well developed, in no acute distress. HEENT: normal. Neck: Supple, no JVD, carotid bruits, or masses. Cardiac: RRR, no murmurs, rubs, or gallops. No clubbing, cyanosis, edema.  Radials/DP/PT 2+ and equal bilaterally.  Respiratory:  Respirations regular and unlabored, clear to auscultation bilaterally. GI: Soft, nontender, nondistended, BS + x 4. MS: no deformity or atrophy. Skin: warm and dry, no rash. Neuro:  Strength and sensation are intact. Psych: Normal affect.  Accessory Clinical Findings    Recent Labs: 01/06/2022: BUN 25; Creatinine, Ser 1.73; Hemoglobin 12.5; Platelets 171; Potassium 4.1; Sodium 140   Recent Lipid Panel    Component Value Date/Time   CHOL 118 11/18/2019 1446   TRIG 49 11/18/2019 1446  HDL 52 11/18/2019 1446   CHOLHDL 2.3 11/18/2019 1446   LDLCALC 54 11/18/2019 1446    ECG personally reviewed by me today-none today.  Echocardiogram 11/25/2019  IMPRESSIONS     1. Normal GLS -19.7. Left ventricular ejection fraction, by estimation,  is 60 to 65%. The left ventricle has normal function. The left ventricle  has no regional wall motion abnormalities. Left ventricular diastolic  parameters are consistent with Grade I   diastolic dysfunction (impaired relaxation).   2. Right ventricular systolic function is normal. The right ventricular  size is normal.   3. The mitral valve is normal in structure. Trivial mitral valve  regurgitation. No evidence of mitral stenosis.   4. The aortic valve is tricuspid. Aortic valve regurgitation is mild.  Mild aortic valve sclerosis is present, with no evidence of aortic valve  stenosis.   5. Aortic dilatation noted. There is mild dilatation of the ascending  aorta measuring 38 mm.   6. The inferior vena cava is normal in size with greater than 50%  respiratory variability, suggesting right atrial pressure of 3 mmHg.   FINDINGS   Left Ventricle: Normal GLS -19.7. Left ventricular  ejection fraction, by  estimation, is 60 to 65%. The left ventricle has normal function. The left  ventricle has no regional wall motion abnormalities. The left ventricular  internal cavity size was normal   in size. There is no left ventricular hypertrophy. Left ventricular  diastolic parameters are consistent with Grade I diastolic dysfunction  (impaired relaxation).   Right Ventricle: The right ventricular size is normal. No increase in  right ventricular wall thickness. Right ventricular systolic function is  normal.   Left Atrium: Left atrial size was normal in size.   Right Atrium: Right atrial size was normal in size.   Pericardium: There is no evidence of pericardial effusion.   Mitral Valve: The mitral valve is normal in structure. There is mild  thickening of the mitral valve leaflet(s). There is mild calcification of  the mitral valve leaflet(s). Normal mobility of the mitral valve leaflets.  Mild mitral annular calcification.  Trivial mitral valve regurgitation. No evidence of mitral valve stenosis.   Tricuspid Valve: The tricuspid valve is normal in structure. Tricuspid  valve regurgitation is trivial. No evidence of tricuspid stenosis.   Aortic Valve: The aortic valve is tricuspid. Aortic valve regurgitation is  mild. Mild aortic valve sclerosis is present, with no evidence of aortic  valve stenosis.   Pulmonic Valve: The pulmonic valve was normal in structure. Pulmonic valve  regurgitation is mild. No evidence of pulmonic stenosis.   Aorta: Aortic dilatation noted. There is mild dilatation of the ascending  aorta measuring 38 mm.   Venous: The inferior vena cava is normal in size with greater than 50%  respiratory variability, suggesting right atrial pressure of 3 mmHg.   IAS/Shunts: No atrial level shunt detected by color flow Doppler.  MR angio chest 01/28/2020  IMPRESSION: 1. 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 2. Borderline cardiomegaly with mild enlargement of the main pulmonary artery, nonspecific though could be seen in the setting of pulmonary arterial hypertension. Further evaluation with cardiac echo could be performed as indicated.  Assessment & Plan   1.  Chronic diastolic CHF-no increased DOE or activity intolerance.  Continues to be euvolemic.  Echocardiogram showed G1 DD and normal ejection fraction. Continue losartan Heart healthy low-sodium diet-salty 6 given Increase physical activity as tolerated Daily  weights Elevate lower extremities when not active  Essential hypertension-BP today 124/60.  Well-controlled at home. Continue amlodipine, losartan Heart healthy low-sodium diet-salty 6 given Increase physical activity as tolerated  Hyperlipidemia-LDL 54 on 11/18/2019. Continue rosuvastatin Heart healthy low-sodium high-fiber diet Increase physical activity as tolerated Request labs from PCP  Palpitations, syncope-seen and evaluated in the emergency department 6/23.  Noted to have slightly low hemoglobin.  Other labs and diagnostics were unremarkable.  No further episodes of lightheadedness presyncope or syncope.  Previous cardiac event monitor showed no significant abnormalities. Maintain p.o. hydration, change positions slowly Request cardiac event monitor results from PCP  Aortic dilation-underwent MRA which showed mild dilation of the ascending aorta at 36 mm.  Denies recent episodes of chest or back discomfort.  Blood pressure well controlled. Recommend repeat MRA 6/24  Disposition: Follow-up with Dr. Gardiner Rhyme or me in 3-4 months.   Jossie Ng. [place] Silveria NP-C     02/27/2022, 11:53 AM [address] Office [phone] Fax [phone]  Notice: This dictation was prepared with Dragon dictation along with smaller phrase technology. Any transcriptional errors that result from this process are  unintentional and may not be corrected upon review.  I spent 14 minutes examining this patient, reviewing medications, and using patient centered shared decision making involving her cardiac care.  Prior to her visit I spent greater than 20 minutes reviewing her past medical history,  medications, and prior cardiac tests.

## 2022-02-27 ENCOUNTER — Encounter: Payer: Self-pay | Admitting: General Practice

## 2022-02-27 ENCOUNTER — Ambulatory Visit: Payer: Medicare HMO | Admitting: General Practice

## 2022-02-27 VITALS — BP 124/60 | HR 70 | Ht 67.0 in | Wt 184.6 lb

## 2022-02-27 DIAGNOSIS — I5032 Chronic diastolic (congestive) heart failure: Secondary | ICD-10-CM

## 2022-02-27 DIAGNOSIS — E785 Hyperlipidemia, unspecified: Secondary | ICD-10-CM | POA: Diagnosis not present

## 2022-02-27 DIAGNOSIS — I10 Essential (primary) hypertension: Secondary | ICD-10-CM

## 2022-02-27 DIAGNOSIS — R002 Palpitations: Secondary | ICD-10-CM | POA: Diagnosis not present

## 2022-02-27 DIAGNOSIS — I77819 Aortic ectasia, unspecified site: Secondary | ICD-10-CM

## 2022-02-27 NOTE — Patient Instructions (Signed)
Medication Instructions:  The current medical regimen is effective;  continue present plan and medications as directed. Please refer to the Current Medication list given to you today.   *If you need a refill on your cardiac medications before your next appointment, please call your pharmacy*  Lab Work:   Testing/Procedures:  NONE    NONE If you have labs (blood work) drawn today and your tests are completely normal, you will receive your results only by: [place] (if you have MyChart) OR  A paper copy in the mail If you have any lab test that is abnormal or we need to change your treatment, we will call you to review the results.  [facility] ([facility])   [address]   [phone] [address] [place] [facility])  Follow-Up: Your next appointment:  3-4 month(s) In Person with Donato Heinz, MD   At [facility], you and your health needs are our priority.  As part of our continuing mission to provide you with exceptional heart care, we have created designated Provider Care Teams.  These Care Teams include your primary Cardiologist (physician) and Advanced Practice Providers (APPs -  Physician Assistants and Nurse Practitioners) who all work together to provide you with the care you need, when you need it.  We recommend signing up for the patient portal called "MyChart".  Sign up information is provided on this After Visit Summary.  MyChart is used to connect with patients for Virtual Visits (Telemedicine).  Patients are able to view lab/test results, encounter notes, upcoming appointments, etc.  Non-urgent messages can be sent to your provider as well.   To learn more about what you can do with MyChart, go to  [email or web address].    Important Information About Sugar

## 2022-04-26 ENCOUNTER — Ambulatory Visit
Admission: RE | Admit: 2022-04-26 | Discharge: 2022-04-26 | Disposition: A | Discharge status: Home / Self Care | Payer: Medicare HMO | Source: Ambulatory Visit | Attending: Family | Admitting: Family

## 2022-04-26 ENCOUNTER — Other Ambulatory Visit: Payer: Self-pay | Admitting: Family

## 2022-04-26 DIAGNOSIS — K5900 Constipation, unspecified: Secondary | ICD-10-CM

## 2022-04-29 IMAGING — CT CT RENAL STONE PROTOCOL
2 of 4 series · 16 of 46 positions shown, 18 images · non-contrast
Comparison: None.

CLINICAL DATA: Blood coming from penis for 1 day.



[Series 2: axial st · axial · 0.74mm/px · z∈[-504,-154]mm · 13 of 78 slices shown, 15 images]
[im 4/78  soft-tissue]
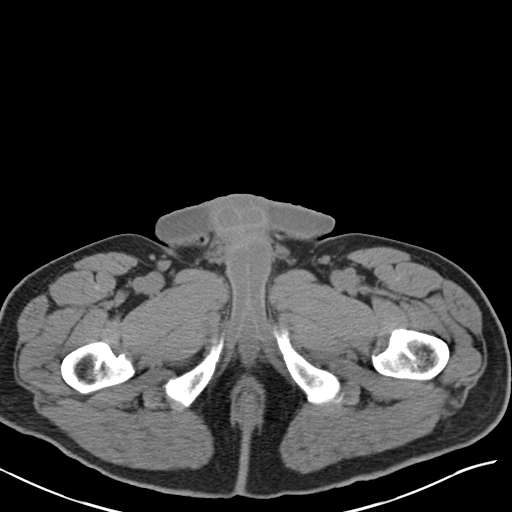
[im 4/78  bone]
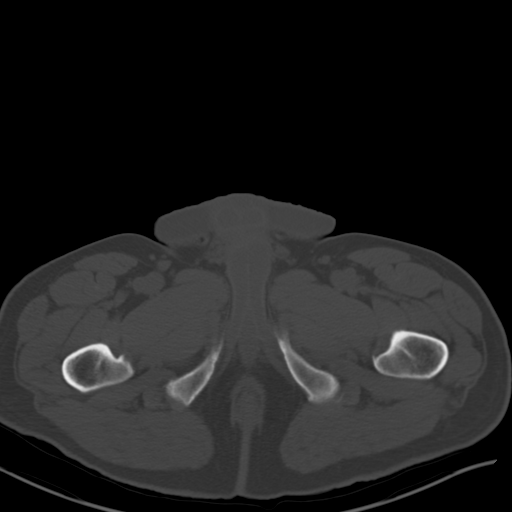
[im 12/78  soft-tissue]
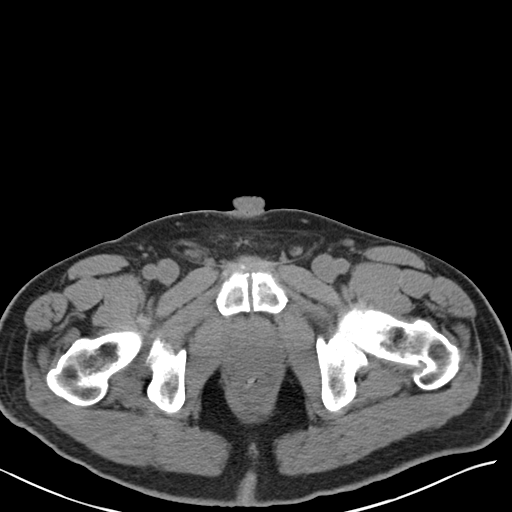
[im 15/78  soft-tissue]
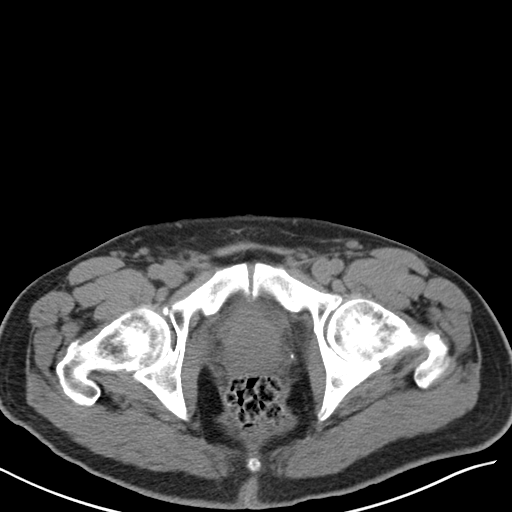
[im 23/78  soft-tissue]
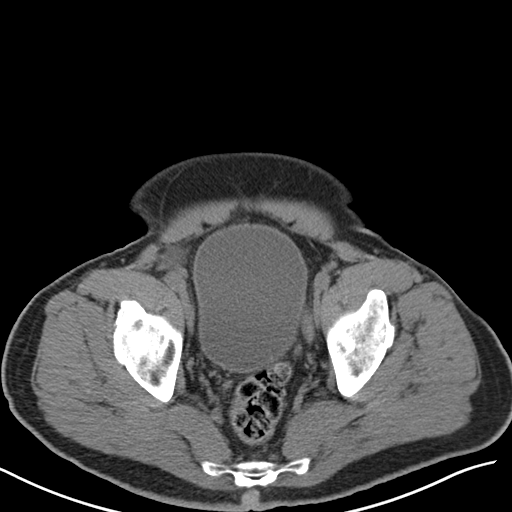
[im 26/78  soft-tissue]
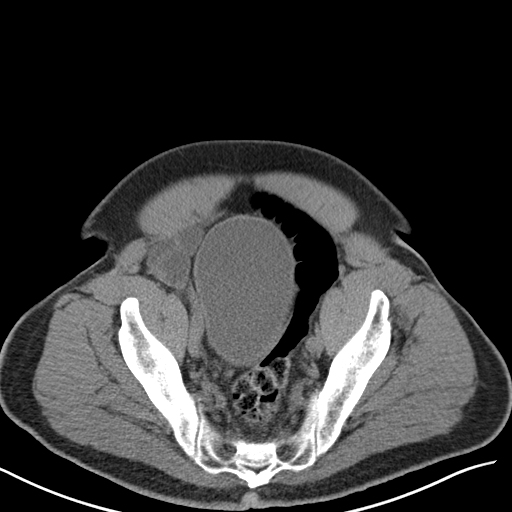
[im 34/78  soft-tissue]
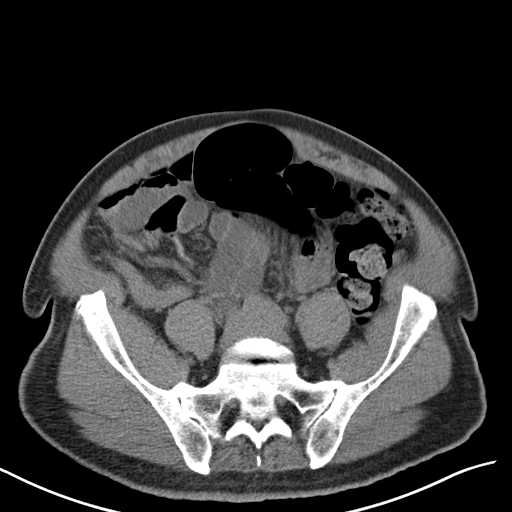
[im 41/78  soft-tissue]
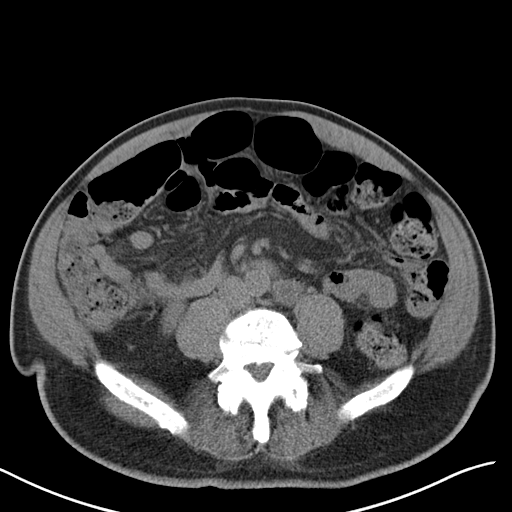
[im 45/78  soft-tissue]
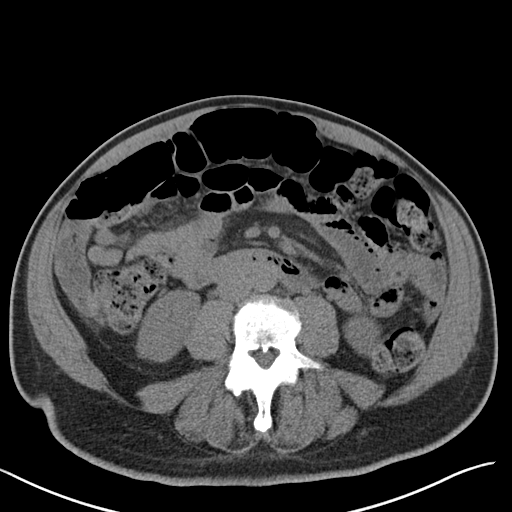
[im 52/78  soft-tissue]
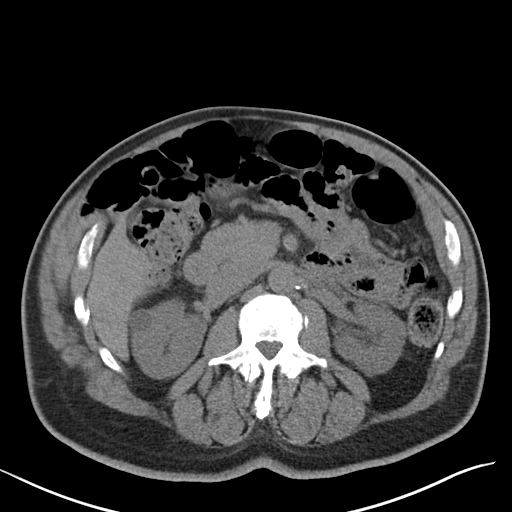
[im 52/78  bone]
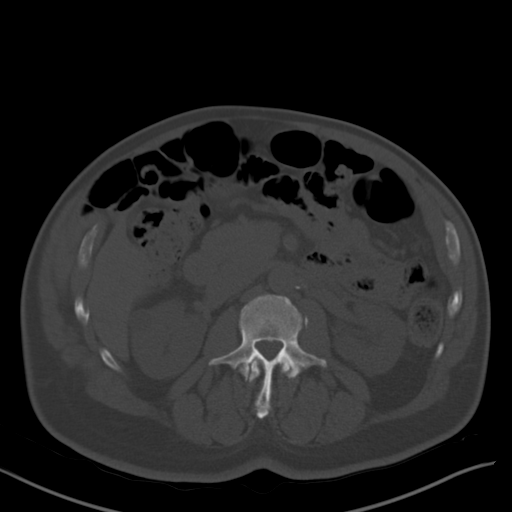
[im 56/78  soft-tissue]
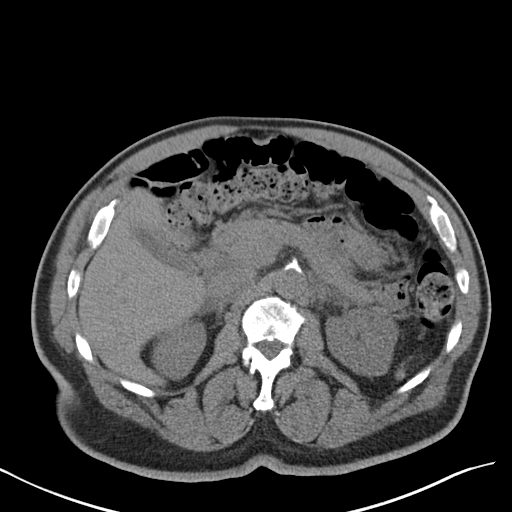
[im 63/78  soft-tissue]
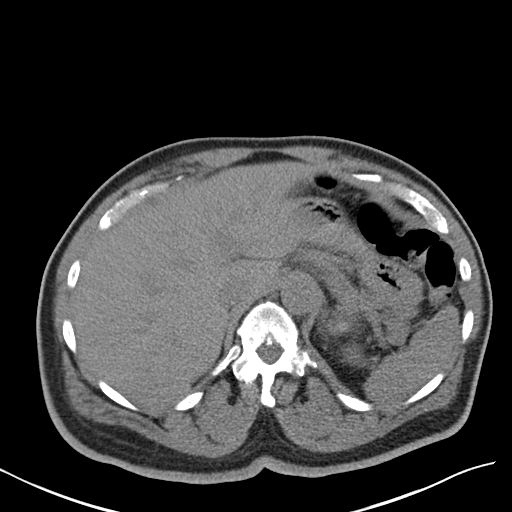
[im 67/78  soft-tissue]
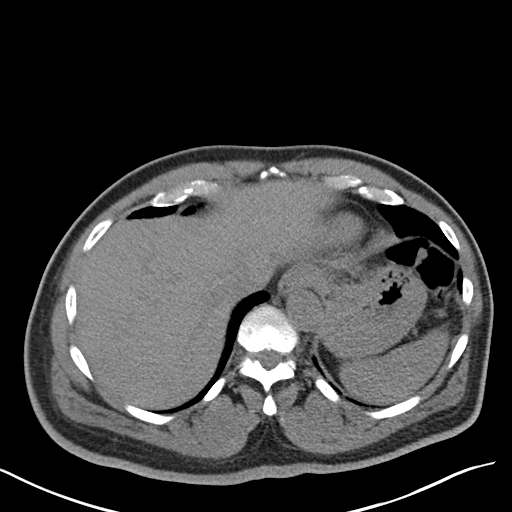
[im 74/78  soft-tissue]
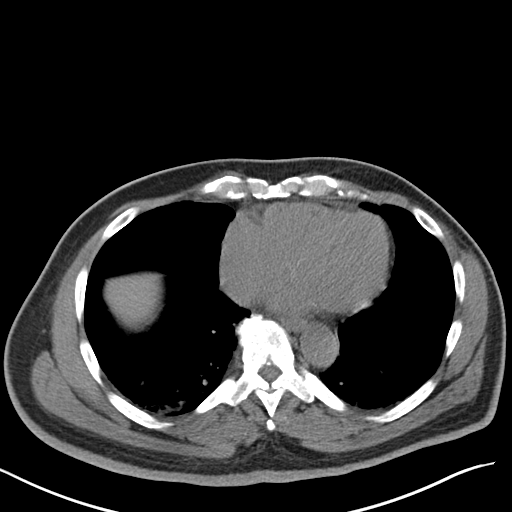

[Series 4: coronal · coronal · 0.69mm/px · 3 of 151 slices shown]
[im 51/151  soft-tissue]
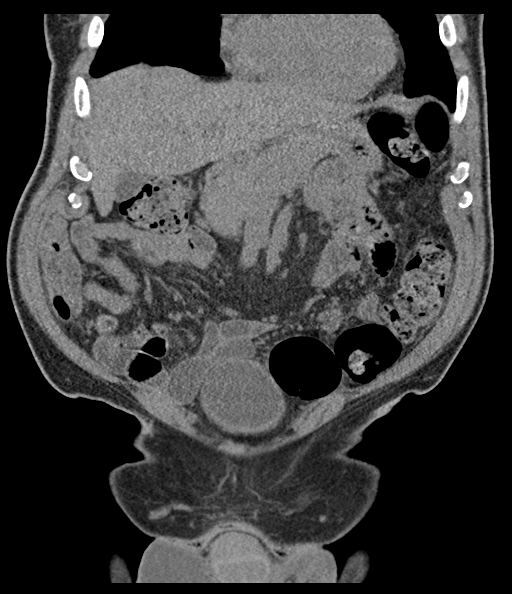
[im 67/151  soft-tissue]
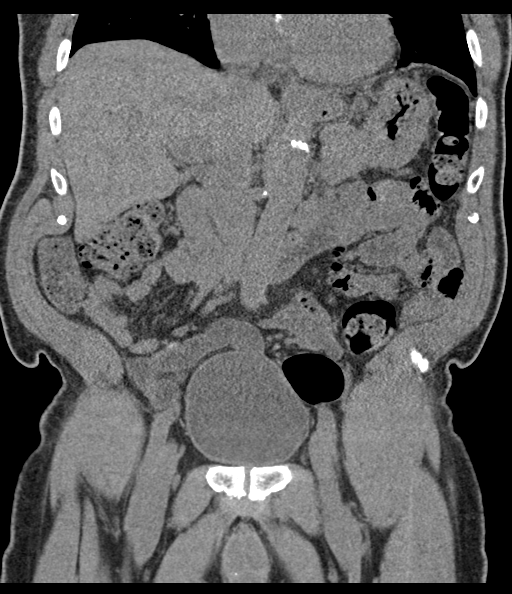
[im 84/151  soft-tissue]
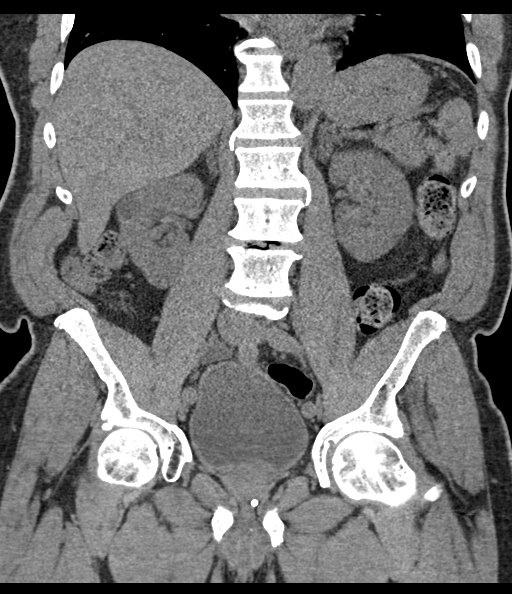

[16 of 46 positions shown; findings below may reference images not displayed]

FINDINGS: Lower chest: Mild posterior right basilar scarring and/or
atelectasis is seen.

Hepatobiliary: No focal liver abnormality is seen. No gallstones,
gallbladder wall thickening, or biliary dilatation.

Pancreas: Unremarkable. No pancreatic ductal dilatation or
surrounding inflammatory changes.

Spleen: Normal in size without focal abnormality.

Adrenals/Urinary Tract: Mild to moderate severity diffuse bilateral
adrenal gland enlargement is seen. Kidneys are normal in size,
without renal calculi or hydronephrosis. A 1.8 cm cyst is seen
within the mid right kidney. An additional 0.8 cm cyst is noted
within the medial aspect of the mid to upper left kidney. The
urinary bladder is moderately distended and otherwise unremarkable.

Stomach/Bowel: Stomach is within normal limits. Appendix appears
normal. Stool is seen throughout the large bowel. No evidence of
bowel wall thickening, distention, or inflammatory changes.

Vascular/Lymphatic: Aortic atherosclerosis. No enlarged abdominal or
pelvic lymph nodes.

Reproductive: Moderate severity prostate gland enlargement is seen.

Other: No abdominal wall hernia or abnormality. No abdominopelvic
ascites.

Musculoskeletal: Degenerative changes are seen throughout the lumbar
spine
IMPRESSION: 1. No acute or active process within the abdomen or pelvis.
2. Moderate severity prostate gland enlargement. Recommend
correlation with PSA values.
3. Bilateral renal cysts.
4. Aortic atherosclerosis.

Aortic Atherosclerosis ([id number]).

## 2022-05-09 ENCOUNTER — Telehealth: Payer: Self-pay | Admitting: Neurology

## 2022-05-09 NOTE — Telephone Encounter (Signed)
Pt's wife, Enoch Moffa requested a sooner appt due to Pt memory worsening, difficulty comprehending  the simple things, has been more nervous in the past week. Most of the time he does not know what to do.  Rescheduled f/u appt with Judson Roch, NP 05/11/22 at 8:15 am.

## 2022-05-10 NOTE — Progress Notes (Unsigned)
Patient: Eric Barrett Date of Birth: March 20, 1941  Reason for Visit: Follow up History from: Patient Primary Neurologist: Dr. Krista Blue  ASSESSMENT AND PLAN 81 y.o. 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  History of stroke -On aspirin 81 mg daily -In the brain showed small vessel disease involving left basal ganglia, right thalamus  4.  Worsening memory loss, weakness, urinary frequency, worsening leg tremor with standing -Concern for UTI contributing to symptoms? -Will drop urine sample at PCP for testing today -Check CBC, CMP, Depakote level at our office  -If UA/labs unrevealing, consider PT -I will see in 4 months sooner if needed   HISTORY  Eric Barrett is a 81 year old male, seen in request by his primary care nurse practitioner Dustin Folks A for evaluation of bilateral lower extremity tremor, initial evaluation was on November 27, 2019.   I have reviewed and summarized the referring note from the referring physician.  He has past medical history of hypertension, hyperlipidemia, tobacco use, reported history of stroke in 2019, presented with acute onset dysarthria, left upper and lower extremity weakness in 2019, was treated at [place], moved to [place] in September 2020, lives in apartment, continue complains of mild left leg weakness, tremorous unsteady sensation when  bearing weight, denies significant gait abnormality   He was seen by cardiologist recently, her cardiogram showed ejection fraction 60% in 5427, chronic diastolic heart failure, trace edema, ordered 30 days cardiac monitoring    UPDATE Aug 02 2021: He is accompanied by his wife at today's clinical visit, he reported worsening short-term memory loss, when he walked to a different room, he would often forgot why he was there, also complains of decreased appetite, with some weight loss, get up few times each night to use bathroom, MoCA examination 14/30 today,   I personally reviewed MRI of the brain without contrast in June 2022, no acute abnormality, generalized atrophy, small vessel disease  Laboratory evaluation showed normal TSH, B12, RPR, ESR, CMP, glucose of 129, CBC hemoglobin of 15.6   UPDATE June 15th 2023: He is accompanied by his wife at today's visit, she witnessed patient's passing out on January 06, 2022, there was sitting on the bench talking, patient was noted to become quiet, had his sunglasses on, 5 minutes later, when the transportation,, she was not able to woke him up, his body was tense, then he slumped over drooling, out of his mouth and nose, 911 was called, by the time paramedic came, he came around, but confused  This is his third episode, first episode was in January 2023 at home, he was confused, blank look on his face, she helped him set, then he went limp, lasting for 5 minutes, with postevent confusion,   Another episode in 2019, his eyes rolled back, body shaking, stiff lasting for few minutes,     At the emergency room, I personally reviewed CT head, no acute abnormality, small vessel disease  Laboratory evaluation showed hemoglobin 12.5, creatinine 1.73   He has been wearing cardiac monitoring for 2 weeks  Update May 11, 2022 SS: Here today with wife, got here just on time, spent 15 minutes in the bathroom trying to urinate, wanted to be seen for earlier  appointment. Fall on 9/29 in the kitchen, memory is worsening, trouble comprehending. Feels more nervous. Last night had to go to the bathroom several times, urinate every hour. Usually able to walk alone, lately uses walker due to feeling jittery, nervous. No more passing out spells. Remains on Depakote ER 500 mg daily. Has seen cardiology.   REVIEW OF SYSTEMS: Out of a complete 14 system review of symptoms, the patient complains only of the following symptoms, and all other reviewed systems are negative.  See HPI  ALLERGIES: No Known Allergies  HOME MEDICATIONS: Outpatient Medications Prior to Visit  Medication Sig Dispense Refill   amLODipine (NORVASC) 10 MG tablet Take 10 mg by mouth daily.     ASPIRIN 81 PO Take 81 mg by mouth daily.     Cholecalciferol (VITAMIN D3) 25 MCG (1000 UT) CAPS Take 1,000 Units by mouth daily.     divalproex (DEPAKOTE ER) 500 MG 24 hr tablet Take 1 tablet (500 mg total) by mouth at bedtime. 30 tablet 11   docusate sodium (COLACE) 100 MG capsule Take 100 mg by mouth daily.     finasteride (PROSCAR) 5 MG tablet Take 5 mg by mouth daily.     losartan (COZAAR) 100 MG tablet Take 100 mg by mouth at bedtime.     memantine (NAMENDA) 10 MG tablet Take 10 mg by mouth 2 (two) times daily.     multivitamin-lutein (OCUVITE-LUTEIN) CAPS capsule Take 1 capsule by mouth daily.     MYRBETRIQ 25 MG TB24 tablet Take 25 mg by mouth daily.     rosuvastatin (CRESTOR) 20 MG tablet Take 20 mg by mouth daily.     tamsulosin (FLOMAX) 0.4 MG CAPS capsule Take 0.4 mg by mouth daily.     Travoprost, BAK Free, (TRAVATAN) 0.004 % SOLN ophthalmic solution Place 1 drop into both eyes at bedtime.     No facility-administered medications prior to visit.    PAST MEDICAL HISTORY: Past Medical History:  Diagnosis Date   CHF (congestive heart failure) (HCC)    Glaucoma    Hemiparesis of left nondominant side (HCC)    History of shingles    HLD (hyperlipidemia)    Hypertension     Stroke ([place])    Tremor     PAST SURGICAL HISTORY: Past Surgical History:  Procedure Laterality Date   No past surgery      FAMILY HISTORY: Family History  Problem Relation Age of Onset   Heart attack Mother    Heart attack Father     SOCIAL HISTORY: Social History   Socioeconomic History   Marital status: Married    Spouse name: [name]   Number of children: 0   Years of education: 12   Highest education level: High school graduate  Occupational History   Occupation: Retired  Tobacco Use   Smoking status: Former   Smokeless tobacco: Never  Substance and Sexual Activity   Alcohol use: Not Currently   Drug use: Never   Sexual activity: Not on file  Other Topics Concern   Not on file  Social History Narrative   Lives at home with his wife.   Right-handed.   One cup coffee per day.   Social  Determinants of Health   Financial Resource Strain: Not on file  Food Insecurity: Not on file  Transportation Needs: Not on file  Physical Activity: Not on file  Stress: Not on file  Social Connections: Not on file  Intimate Partner Violence: Not on file   PHYSICAL EXAM  Vitals:   05/11/22 0833  BP: (!) 169/93  Pulse: 99   There is no height or weight on file to calculate BMI.  Generalized: Well developed, in no acute distress, in wheelchair Neurological examination  Mentation: Alert, cooperative, answers questions, most history is provided by his wife, speech is clear, some delay with response Cranial nerve II-XII: Pupils were equal round reactive to light. Extraocular movements were full, visual field were full on confrontational test. Left eye opening slightly smaller than left, redness noted to conjunctive a, facial sensation and strength were normal. Head turning and shoulder shrug  were normal and symmetric. Motor: The motor testing reveals 5 over 5 strength of all 4 extremities. Good symmetric motor tone is noted throughout.  No localized weakness noted Sensory:  Sensory testing is intact to soft touch on all 4 extremities. No evidence of extinction is noted.  Coordination: Cerebellar testing reveals good finger-nose-finger and heel-to-shin bilaterally.  There is some apraxia with commands Gait and station: able to stand from wheelchair, when standing, legs are shaky, we did not continue further   DIAGNOSTIC DATA (LABS, IMAGING, TESTING) - I reviewed patient records, labs, notes, testing and imaging myself where available.  Lab Results  Component Value Date   WBC 6.0 01/06/2022   HGB 12.5 (L) 01/06/2022   HCT 37.7 (L) 01/06/2022   MCV 83.4 01/06/2022   PLT 171 01/06/2022      Component Value Date/Time   NA 140 01/06/2022 1515   NA 141 12/24/2019 1052   K 4.1 01/06/2022 1515   CL 109 01/06/2022 1515   CO2 26 01/06/2022 1515   GLUCOSE 120 (H) 01/06/2022 1515   BUN 25 (H) 01/06/2022 1515   BUN 17 12/24/2019 1052   CREATININE 1.73 (H) 01/06/2022 1515   CALCIUM 9.2 01/06/2022 1515   PROT 7.9 01/18/2021 1451   ALBUMIN 3.9 01/18/2021 1451   ALBUMIN 4.5 11/18/2019 1447   AST 25 01/18/2021 1451   ALT 19 01/18/2021 1451   ALKPHOS 60 01/18/2021 1451   BILITOT 0.9 01/18/2021 1451   GFRNONAA 39 (L) 01/06/2022 1515   GFRAA 57 (L) 12/24/2019 1052   Lab Results  Component Value Date   CHOL 118 11/18/2019   HDL 52 11/18/2019   LDLCALC 54 11/18/2019   TRIG 49 11/18/2019   CHOLHDL 2.3 11/18/2019   No results found for: "HGBA1C" Lab Results  Component Value Date   VITAMINB12 1,020 01/27/2021   Lab Results  Component Value Date   TSH 0.732 01/27/2021    Butler Denmark, AGNP-C, DNP 05/11/2022, 8:59 AM [facility] [address], [place], [place] [address] [phone]

## 2022-05-11 ENCOUNTER — Ambulatory Visit: Payer: Medicare HMO | Admitting: Neurology

## 2022-05-11 VITALS — BP 169/93 | HR 99

## 2022-05-11 DIAGNOSIS — F0390 Unspecified dementia without behavioral disturbance: Secondary | ICD-10-CM

## 2022-05-11 DIAGNOSIS — R404 Transient alteration of awareness: Secondary | ICD-10-CM

## 2022-05-11 DIAGNOSIS — I6381 Other cerebral infarction due to occlusion or stenosis of small artery: Secondary | ICD-10-CM | POA: Diagnosis not present

## 2022-05-11 NOTE — Patient Instructions (Signed)
Please call your primary care doctor to arrange a urine test We will check labs here today I will update you once results return Keep [place] posted, otherwise follow-up in about 4 months No change in medication today

## 2022-05-12 LAB — COMPREHENSIVE METABOLIC PANEL
ALT: 21 IU/L (ref 0–44)
AST: 38 IU/L (ref 0–40)
Albumin/Globulin Ratio: 1.4 (ref 1.2–2.2)
Albumin: 4.2 g/dL (ref 3.7–4.7)
Alkaline Phosphatase: 53 IU/L (ref 44–121)
BUN/Creatinine Ratio: 10 (ref 10–24)
BUN: 12 mg/dL (ref 8–27)
Bilirubin Total: 0.4 mg/dL (ref 0.0–1.2)
CO2: 20 mmol/L (ref 20–29)
Calcium: 9.4 mg/dL (ref 8.6–10.2)
Chloride: 82 mmol/L — ABNORMAL LOW (ref 96–106)
Creatinine, Ser: 1.2 mg/dL (ref 0.76–1.27)
Globulin, Total: 3 g/dL (ref 1.5–4.5)
Glucose: 156 mg/dL — ABNORMAL HIGH (ref 70–99)
Potassium: 5.5 mmol/L — ABNORMAL HIGH (ref 3.5–5.2)
Sodium: 122 mmol/L — ABNORMAL LOW (ref 134–144)
Total Protein: 7.2 g/dL (ref 6.0–8.5)
eGFR: 61 mL/min/{1.73_m2} (ref >59)

## 2022-05-12 LAB — CBC WITH DIFFERENTIAL/PLATELET
Basophils Absolute: 0 10*3/uL (ref 0.0–0.2)
Basos: 0 %
EOS (ABSOLUTE): 0.1 10*3/uL (ref 0.0–0.4)
Eos: 1 %
Hematocrit: 41.6 % (ref 37.5–51.0)
Hemoglobin: 14.3 g/dL (ref 13.0–17.7)
Immature Grans (Abs): 0 10*3/uL (ref 0.0–0.1)
Immature Granulocytes: 0 %
Lymphocytes Absolute: 0.7 10*3/uL (ref 0.7–3.1)
Lymphs: 9 %
MCH: 27.6 pg (ref 26.6–33.0)
MCHC: 34.4 g/dL (ref 31.5–35.7)
MCV: 80 fL (ref 79–97)
Monocytes Absolute: 0.7 10*3/uL (ref 0.1–0.9)
Monocytes: 9 %
Neutrophils Absolute: 6.2 10*3/uL (ref 1.4–7.0)
Neutrophils: 81 %
Platelets: 231 10*3/uL (ref 150–450)
RBC: 5.19 x10E6/uL (ref 4.14–5.80)
RDW: 14.2 % (ref 11.6–15.4)
WBC: 7.6 10*3/uL (ref 3.4–10.8)

## 2022-05-12 LAB — VALPROIC ACID LEVEL: Valproic Acid Lvl: 52 ug/mL (ref 50–100)

## 2022-05-14 ENCOUNTER — Telehealth: Payer: Self-pay | Admitting: Neurology

## 2022-05-14 NOTE — Telephone Encounter (Addendum)
Reviewing labs Sunday night, noted very low sodium level at 122. Did not trigger as critical level. I tried to reach his wife late Sunday night multiple times, without success, one mailbox was full, the other was not setup for VM. No other contact #'s were listed.   I was able to find a different # in a prior DPR and get in touch with his wife at 42 PM. She indicates he continues to have weakness, trouble with walking. I recommended she take him to the ER or call 911. She prefers to wait until the morning.   Please reach out first thing in the morning to check on them. Again relay my recommendation to go to the ER given his electrolyte abnormalities:  Sodium 122, potassium 5.5, chloride 82.

## 2022-05-15 ENCOUNTER — Encounter (HOSPITAL_COMMUNITY): Payer: Self-pay | Admitting: Emergency Medicine

## 2022-05-15 ENCOUNTER — Inpatient Hospital Stay (HOSPITAL_COMMUNITY)
Admission: EM | Admit: 2022-05-15 | Discharge: 2022-05-24 | DRG: 643 | Disposition: A | Discharge status: Skilled Nursing Facility | Payer: Medicare HMO | Attending: Internal Medicine | Admitting: Internal Medicine

## 2022-05-15 ENCOUNTER — Other Ambulatory Visit: Payer: Self-pay

## 2022-05-15 ENCOUNTER — Inpatient Hospital Stay (HOSPITAL_COMMUNITY): Payer: Medicare HMO

## 2022-05-15 DIAGNOSIS — I77810 Thoracic aortic ectasia: Secondary | ICD-10-CM | POA: Diagnosis present

## 2022-05-15 DIAGNOSIS — D125 Benign neoplasm of sigmoid colon: Secondary | ICD-10-CM | POA: Diagnosis present

## 2022-05-15 DIAGNOSIS — N179 Acute kidney failure, unspecified: Secondary | ICD-10-CM | POA: Diagnosis present

## 2022-05-15 DIAGNOSIS — B957 Other staphylococcus as the cause of diseases classified elsewhere: Secondary | ICD-10-CM | POA: Diagnosis present

## 2022-05-15 DIAGNOSIS — I6990 Unspecified sequelae of unspecified cerebrovascular disease: Secondary | ICD-10-CM | POA: Diagnosis not present

## 2022-05-15 DIAGNOSIS — Z8619 Personal history of other infectious and parasitic diseases: Secondary | ICD-10-CM

## 2022-05-15 DIAGNOSIS — G9341 Metabolic encephalopathy: Secondary | ICD-10-CM | POA: Diagnosis present

## 2022-05-15 DIAGNOSIS — E222 Syndrome of inappropriate secretion of antidiuretic hormone: Secondary | ICD-10-CM | POA: Diagnosis present

## 2022-05-15 DIAGNOSIS — I959 Hypotension, unspecified: Secondary | ICD-10-CM | POA: Diagnosis not present

## 2022-05-15 DIAGNOSIS — N390 Urinary tract infection, site not specified: Secondary | ICD-10-CM | POA: Diagnosis present

## 2022-05-15 DIAGNOSIS — G8929 Other chronic pain: Secondary | ICD-10-CM | POA: Diagnosis present

## 2022-05-15 DIAGNOSIS — E785 Hyperlipidemia, unspecified: Secondary | ICD-10-CM | POA: Diagnosis present

## 2022-05-15 DIAGNOSIS — I69354 Hemiplegia and hemiparesis following cerebral infarction affecting left non-dominant side: Secondary | ICD-10-CM | POA: Diagnosis not present

## 2022-05-15 DIAGNOSIS — E860 Dehydration: Secondary | ICD-10-CM | POA: Diagnosis present

## 2022-05-15 DIAGNOSIS — I130 Hypertensive heart and chronic kidney disease with heart failure and stage 1 through stage 4 chronic kidney disease, or unspecified chronic kidney disease: Secondary | ICD-10-CM | POA: Diagnosis present

## 2022-05-15 DIAGNOSIS — M545 Low back pain, unspecified: Secondary | ICD-10-CM | POA: Diagnosis present

## 2022-05-15 DIAGNOSIS — G40909 Epilepsy, unspecified, not intractable, without status epilepticus: Secondary | ICD-10-CM | POA: Diagnosis present

## 2022-05-15 DIAGNOSIS — I10 Essential (primary) hypertension: Secondary | ICD-10-CM | POA: Diagnosis not present

## 2022-05-15 DIAGNOSIS — I5032 Chronic diastolic (congestive) heart failure: Secondary | ICD-10-CM | POA: Diagnosis present

## 2022-05-15 DIAGNOSIS — K640 First degree hemorrhoids: Secondary | ICD-10-CM | POA: Diagnosis not present

## 2022-05-15 DIAGNOSIS — H409 Unspecified glaucoma: Secondary | ICD-10-CM | POA: Diagnosis present

## 2022-05-15 DIAGNOSIS — N3001 Acute cystitis with hematuria: Secondary | ICD-10-CM | POA: Diagnosis not present

## 2022-05-15 DIAGNOSIS — R194 Change in bowel habit: Secondary | ICD-10-CM

## 2022-05-15 DIAGNOSIS — R933 Abnormal findings on diagnostic imaging of other parts of digestive tract: Secondary | ICD-10-CM | POA: Diagnosis not present

## 2022-05-15 DIAGNOSIS — E875 Hyperkalemia: Secondary | ICD-10-CM | POA: Diagnosis present

## 2022-05-15 DIAGNOSIS — D72829 Elevated white blood cell count, unspecified: Secondary | ICD-10-CM | POA: Diagnosis not present

## 2022-05-15 DIAGNOSIS — D631 Anemia in chronic kidney disease: Secondary | ICD-10-CM | POA: Diagnosis present

## 2022-05-15 DIAGNOSIS — K581 Irritable bowel syndrome with constipation: Secondary | ICD-10-CM | POA: Diagnosis present

## 2022-05-15 DIAGNOSIS — Z7982 Long term (current) use of aspirin: Secondary | ICD-10-CM

## 2022-05-15 DIAGNOSIS — N1832 Chronic kidney disease, stage 3b: Secondary | ICD-10-CM | POA: Diagnosis present

## 2022-05-15 DIAGNOSIS — G309 Alzheimer's disease, unspecified: Secondary | ICD-10-CM | POA: Diagnosis not present

## 2022-05-15 DIAGNOSIS — R319 Hematuria, unspecified: Secondary | ICD-10-CM | POA: Diagnosis not present

## 2022-05-15 DIAGNOSIS — R634 Abnormal weight loss: Secondary | ICD-10-CM | POA: Diagnosis not present

## 2022-05-15 DIAGNOSIS — F0280 Dementia in other diseases classified elsewhere without behavioral disturbance: Secondary | ICD-10-CM | POA: Diagnosis present

## 2022-05-15 DIAGNOSIS — E871 Hypo-osmolality and hyponatremia: Secondary | ICD-10-CM | POA: Diagnosis not present

## 2022-05-15 DIAGNOSIS — Z515 Encounter for palliative care: Secondary | ICD-10-CM | POA: Diagnosis not present

## 2022-05-15 DIAGNOSIS — R338 Other retention of urine: Secondary | ICD-10-CM | POA: Diagnosis present

## 2022-05-15 DIAGNOSIS — Z66 Do not resuscitate: Secondary | ICD-10-CM | POA: Diagnosis not present

## 2022-05-15 DIAGNOSIS — N3000 Acute cystitis without hematuria: Secondary | ICD-10-CM | POA: Diagnosis not present

## 2022-05-15 DIAGNOSIS — D126 Benign neoplasm of colon, unspecified: Secondary | ICD-10-CM | POA: Diagnosis not present

## 2022-05-15 DIAGNOSIS — Z8719 Personal history of other diseases of the digestive system: Secondary | ICD-10-CM

## 2022-05-15 DIAGNOSIS — R2689 Other abnormalities of gait and mobility: Secondary | ICD-10-CM | POA: Diagnosis present

## 2022-05-15 DIAGNOSIS — K5900 Constipation, unspecified: Secondary | ICD-10-CM

## 2022-05-15 DIAGNOSIS — Z87891 Personal history of nicotine dependence: Secondary | ICD-10-CM

## 2022-05-15 DIAGNOSIS — Z79899 Other long term (current) drug therapy: Secondary | ICD-10-CM | POA: Diagnosis not present

## 2022-05-15 DIAGNOSIS — R4182 Altered mental status, unspecified: Secondary | ICD-10-CM | POA: Diagnosis not present

## 2022-05-15 DIAGNOSIS — K6289 Other specified diseases of anus and rectum: Secondary | ICD-10-CM | POA: Diagnosis not present

## 2022-05-15 DIAGNOSIS — Z7189 Other specified counseling: Secondary | ICD-10-CM | POA: Diagnosis not present

## 2022-05-15 DIAGNOSIS — R631 Polydipsia: Secondary | ICD-10-CM | POA: Diagnosis present

## 2022-05-15 DIAGNOSIS — N401 Enlarged prostate with lower urinary tract symptoms: Secondary | ICD-10-CM | POA: Diagnosis present

## 2022-05-15 DIAGNOSIS — Z8249 Family history of ischemic heart disease and other diseases of the circulatory system: Secondary | ICD-10-CM

## 2022-05-15 DIAGNOSIS — Z8673 Personal history of transient ischemic attack (TIA), and cerebral infarction without residual deficits: Secondary | ICD-10-CM | POA: Diagnosis not present

## 2022-05-15 LAB — COMPREHENSIVE METABOLIC PANEL
ALT: 19 U/L (ref 0–44)
AST: 30 U/L (ref 15–41)
Albumin: 3.5 g/dL (ref 3.5–5.0)
Alkaline Phosphatase: 41 U/L (ref 38–126)
Anion gap: 7 (ref 5–15)
BUN: 11 mg/dL (ref 8–23)
CO2: 26 mmol/L (ref 22–32)
Calcium: 9.1 mg/dL (ref 8.9–10.3)
Chloride: 90 mmol/L — ABNORMAL LOW (ref 98–111)
Creatinine, Ser: 1.34 mg/dL — ABNORMAL HIGH (ref 0.61–1.24)
GFR, Estimated: 53 mL/min — ABNORMAL LOW (ref >60)
Glucose, Bld: 127 mg/dL — ABNORMAL HIGH (ref 70–99)
Potassium: 5.3 mmol/L — ABNORMAL HIGH (ref 3.5–5.1)
Sodium: 123 mmol/L — ABNORMAL LOW (ref 135–145)
Total Bilirubin: 0.5 mg/dL (ref 0.3–1.2)
Total Protein: 7 g/dL (ref 6.5–8.1)

## 2022-05-15 LAB — CBC WITH DIFFERENTIAL/PLATELET
Abs Immature Granulocytes: 0.01 10*3/uL (ref 0.00–0.07)
Basophils Absolute: 0 10*3/uL (ref 0.0–0.1)
Basophils Relative: 1 %
Eosinophils Absolute: 0.1 10*3/uL (ref 0.0–0.5)
Eosinophils Relative: 2 %
HCT: 39.3 % (ref 39.0–52.0)
Hemoglobin: 13.6 g/dL (ref 13.0–17.0)
Immature Granulocytes: 0 %
Lymphocytes Relative: 19 %
Lymphs Abs: 1 10*3/uL (ref 0.7–4.0)
MCH: 27.8 pg (ref 26.0–34.0)
MCHC: 34.6 g/dL (ref 30.0–36.0)
MCV: 80.2 fL (ref 80.0–100.0)
Monocytes Absolute: 0.6 10*3/uL (ref 0.1–1.0)
Monocytes Relative: 12 %
Neutro Abs: 3.7 10*3/uL (ref 1.7–7.7)
Neutrophils Relative %: 66 %
Platelets: 223 10*3/uL (ref 150–400)
RBC: 4.9 MIL/uL (ref 4.22–5.81)
RDW: 13.6 % (ref 11.5–15.5)
WBC: 5.5 10*3/uL (ref 4.0–10.5)
nRBC: 0 % (ref 0.0–0.2)

## 2022-05-15 LAB — URINALYSIS, ROUTINE W REFLEX MICROSCOPIC
Bilirubin Urine: NEGATIVE
Glucose, UA: 50 mg/dL — AB
Ketones, ur: NEGATIVE mg/dL
Nitrite: POSITIVE — AB
Protein, ur: 30 mg/dL — AB
RBC / HPF: >50 RBC/hpf — ABNORMAL HIGH (ref 0–5)
Specific Gravity, Urine: 1.005 (ref 1.005–1.030)
WBC, UA: >50 WBC/hpf — ABNORMAL HIGH (ref 0–5)
pH: 7 (ref 5.0–8.0)

## 2022-05-15 LAB — SODIUM: Sodium: 122 mmol/L — ABNORMAL LOW (ref 135–145)

## 2022-05-15 LAB — OSMOLALITY: Osmolality: 257 mOsm/kg — ABNORMAL LOW (ref 275–295)

## 2022-05-15 MED ORDER — ROSUVASTATIN CALCIUM 20 MG PO TABS
20.0000 mg | ORAL_TABLET | Freq: Every day | ORAL | Status: DC
  Administered 2022-05-16 – 2022-05-24 (×9): 20 mg via ORAL
  Filled 2022-05-15 (×9): qty 1

## 2022-05-15 MED ORDER — ORAL CARE MOUTH RINSE: 15.0000 mL | OROMUCOSAL | Status: DC | PRN

## 2022-05-15 MED ORDER — MIRABEGRON ER 25 MG PO TB24
25.0000 mg | ORAL_TABLET | Freq: Every day | ORAL | Status: DC
  Administered 2022-05-16 – 2022-05-24 (×9): 25 mg via ORAL
  Filled 2022-05-15 (×9): qty 1

## 2022-05-15 MED ORDER — LATANOPROST 0.005 % OP SOLN
1.0000 [drp] | Freq: Every day | OPHTHALMIC | Status: DC
  Administered 2022-05-15 – 2022-05-23 (×9): 1 [drp] via OPHTHALMIC
  Filled 2022-05-15 (×2): qty 2.5

## 2022-05-15 MED ORDER — SODIUM CHLORIDE 0.9 % IV SOLN
1.0000 g | INTRAVENOUS | Status: DC
  Administered 2022-05-16 – 2022-05-18 (×3): 1 g via INTRAVENOUS
  Filled 2022-05-15 (×3): qty 10

## 2022-05-15 MED ORDER — DIVALPROEX SODIUM ER 500 MG PO TB24
500.0000 mg | ORAL_TABLET | Freq: Every day | ORAL | Status: DC
  Administered 2022-05-15 – 2022-05-23 (×9): 500 mg via ORAL
  Filled 2022-05-15 (×10): qty 1

## 2022-05-15 MED ORDER — FINASTERIDE 5 MG PO TABS
5.0000 mg | ORAL_TABLET | Freq: Every day | ORAL | Status: DC
  Administered 2022-05-16 – 2022-05-24 (×9): 5 mg via ORAL
  Filled 2022-05-15 (×9): qty 1

## 2022-05-15 MED ORDER — POLYETHYLENE GLYCOL 3350 17 G PO PACK
17.0000 g | PACK | Freq: Every day | ORAL | Status: DC
  Administered 2022-05-16 – 2022-05-22 (×7): 17 g via ORAL
  Filled 2022-05-15 (×7): qty 1

## 2022-05-15 MED ORDER — MEMANTINE HCL 10 MG PO TABS
10.0000 mg | ORAL_TABLET | Freq: Two times a day (BID) | ORAL | Status: DC
  Administered 2022-05-15 – 2022-05-24 (×18): 10 mg via ORAL
  Filled 2022-05-15 (×19): qty 1

## 2022-05-15 MED ORDER — HYDRALAZINE HCL 25 MG PO TABS: 25.0000 mg | ORAL_TABLET | Freq: Four times a day (QID) | ORAL | Status: DC | PRN

## 2022-05-15 MED ORDER — AMLODIPINE BESYLATE 10 MG PO TABS
10.0000 mg | ORAL_TABLET | Freq: Every day | ORAL | Status: DC
  Administered 2022-05-16: 10 mg via ORAL
  Filled 2022-05-15: qty 1

## 2022-05-15 MED ORDER — TAMSULOSIN HCL 0.4 MG PO CAPS
0.4000 mg | ORAL_CAPSULE | Freq: Every day | ORAL | Status: DC
  Administered 2022-05-16 – 2022-05-24 (×9): 0.4 mg via ORAL
  Filled 2022-05-15 (×9): qty 1

## 2022-05-15 MED ORDER — ASPIRIN 81 MG PO CHEW
81.0000 mg | CHEWABLE_TABLET | Freq: Every day | ORAL | Status: DC
  Administered 2022-05-16 – 2022-05-24 (×9): 81 mg via ORAL
  Filled 2022-05-15 (×9): qty 1

## 2022-05-15 MED ORDER — SODIUM ZIRCONIUM CYCLOSILICATE 10 G PO PACK
10.0000 g | PACK | Freq: Once | ORAL | Status: AC
  Administered 2022-05-16: 10 g via ORAL

## 2022-05-15 MED ORDER — DOCUSATE SODIUM 100 MG PO CAPS
100.0000 mg | ORAL_CAPSULE | Freq: Every day | ORAL | Status: DC
  Administered 2022-05-16 – 2022-05-24 (×9): 100 mg via ORAL
  Filled 2022-05-15 (×9): qty 1

## 2022-05-15 MED ORDER — HEPARIN SODIUM (PORCINE) 5000 UNIT/ML IJ SOLN
5000.0000 [IU] | Freq: Two times a day (BID) | INTRAMUSCULAR | Status: AC
  Administered 2022-05-15 – 2022-05-22 (×15): 5000 [IU] via SUBCUTANEOUS
  Filled 2022-05-15 (×15): qty 1

## 2022-05-15 NOTE — ED Triage Notes (Signed)
Patient sent to ED for evaluation of hyponatremia, sodium of 122 on 10/5. Patient is alert and in no apparent distress at this time.

## 2022-05-15 NOTE — ED Provider Notes (Signed)
[facility] EMERGENCY DEPARTMENT Provider Note   CSN: [id number] Arrival date & time: 05/15/22  2440     History Chief Complaint  Patient presents with   Abnormal Lab    Eric Barrett. is a 81 y.o. male with history of CHF, hyperlipidemia, hypertension, stroke, dementia presents the emergency department with wife for evaluation of abnormal labs.  Patient's wife gives the majority of the story given the patient's dementia.  She reports that he did have a more shuffling gait for the past few days and was concerned so she went to the neurologist.  The neurologist thought that this may be a UTI, so sent him to family medicine.  They drew labs there and tested his urine.  Wife reports that the urine tested positive for UTI, however she was unable to pick up the antibiotic.  Labs were drawn on 05-11-22.  The wife reports that they received a call last night saying that his sodium level was very low and he needed to come to the emergency department.  She reports the patient's been acting at his baseline.  He has not been complaining of much.  She reports that she may have seen some blood in his urine if the few days prior.  Denies any fevers.  Denies seizures.  Denies any new medications.  Patient does report he has some pain on urination.  Denies any abdominal pain, nausea, vomiting.  Denies any chest pain or shortness of breath.  Wife reports he is at baseline.   Abnormal Lab      Home Medications Prior to Admission medications   Medication Sig Start Date End Date Taking? Authorizing Provider  amLODipine (NORVASC) 10 MG tablet Take 10 mg by mouth daily. 07/08/21  Yes [provider]  ASPIRIN 81 PO Take 81 mg by mouth daily.   Yes [provider]  Cholecalciferol (VITAMIN D3) 25 MCG (1000 UT) CAPS Take 1,000 Units by mouth daily.   Yes [provider]  divalproex (DEPAKOTE ER) 500 MG 24 hr tablet Take 1 tablet (500 mg total) by mouth at bedtime.  01/19/22  Yes Levert Feinstein, MD  docusate sodium (COLACE) 100 MG capsule Take 100 mg by mouth daily.   Yes [provider]  finasteride (PROSCAR) 5 MG tablet Take 5 mg by mouth daily. 08/24/21  Yes [provider]  GAVILAX 17 GM/SCOOP powder Take 17 g by mouth daily. 04/26/22  Yes [provider]  losartan (COZAAR) 100 MG tablet Take 100 mg by mouth daily. 10/29/20  Yes [provider]  memantine (NAMENDA) 10 MG tablet Take 10 mg by mouth 2 (two) times daily. 02/08/22  Yes [provider]  MOVANTIK 25 MG TABS tablet Take 25 mg by mouth daily. 05/04/22  Yes [provider]  multivitamin-lutein (OCUVITE-LUTEIN) CAPS capsule Take 1 capsule by mouth daily.   Yes [provider]  MYRBETRIQ 25 MG TB24 tablet Take 25 mg by mouth daily. 11/02/21  Yes [provider]  rosuvastatin (CRESTOR) 20 MG tablet Take 20 mg by mouth daily.   Yes [provider]  tamsulosin (FLOMAX) 0.4 MG CAPS capsule Take 0.4 mg by mouth daily. 10/29/20  Yes [provider]  Travoprost, BAK Free, (TRAVATAN) 0.004 % SOLN ophthalmic solution Place 1 drop into both eyes at bedtime.   Yes [provider]      Allergies    Patient has no known allergies.    Review of Systems   Review of  Systems  Unable to perform ROS: Dementia    Physical Exam Updated Vital Signs BP (!) 169/86   Pulse 68   Temp 98.1 F (36.7 C)   Resp 16   SpO2 97%  Physical Exam Vitals and nursing note reviewed.  Constitutional:      General: He is not in acute distress.    Appearance: He is not ill-appearing or toxic-appearing.     Comments: Pleasantly demented  HENT:     Mouth/Throat:     Mouth: Mucous membranes are moist.  Eyes:     Pupils: Pupils are equal, round, and reactive to light.  Cardiovascular:     Rate and Rhythm: Normal rate.  Pulmonary:     Effort: Pulmonary effort is normal. No respiratory distress.  Abdominal:     General: Bowel sounds are  normal. There is no distension.     Palpations: Abdomen is soft.     Tenderness: There is no abdominal tenderness. There is no guarding or rebound.  Skin:    General: Skin is warm and dry.  Neurological:     Mental Status: He is alert. Mental status is at baseline.     Comments: Wife reports he is at baseline.  He is alert and oriented x3.  Moving all extremities.     ED Results / Procedures / Treatments   Labs (all labs ordered are listed, but only abnormal results are displayed) Labs Reviewed  COMPREHENSIVE METABOLIC PANEL - Abnormal; Notable for the following components:      Result Value   Sodium 123 (*)    Potassium 5.3 (*)    Chloride 90 (*)    Glucose, Bld 127 (*)    Creatinine, Ser 1.34 (*)    GFR, Estimated 53 (*)    All other components within normal limits  URINALYSIS, ROUTINE W REFLEX MICROSCOPIC - Abnormal; Notable for the following components:   Color, Urine PINK (*)    APPearance HAZY (*)    Glucose, UA 50 (*)    Hgb urine dipstick MODERATE (*)    Protein, ur 30 (*)    Nitrite POSITIVE (*)    Leukocytes,Ua LARGE (*)    RBC / HPF >50 (*)    WBC, UA >50 (*)    Bacteria, UA MANY (*)    All other components within normal limits  OSMOLALITY - Abnormal; Notable for the following components:   Osmolality 257 (*)    All other components within normal limits  CBC WITH DIFFERENTIAL/PLATELET  SODIUM, URINE, RANDOM  OSMOLALITY, URINE  SODIUM    EKG None  Radiology  Procedures Procedures   Medications Ordered in ED Medications  cefTRIAXone (ROCEPHIN) 1 g in sodium chloride 0.9 % 100 mL IVPB (has no administration in time range)  aspirin chewable tablet 81 mg (has no administration in time range)  amLODipine (NORVASC) tablet 10 mg (has no administration in time range)  rosuvastatin (CRESTOR) tablet 20 mg (has no administration in time range)  memantine (NAMENDA) tablet 10 mg (has no administration in time range)  docusate sodium (COLACE) capsule 100 mg  (has no administration in time range)  polyethylene glycol (MIRALAX / GLYCOLAX) packet 17 g (has no administration in time range)  finasteride (PROSCAR) tablet 5 mg (has no administration in time range)  mirabegron ER (MYRBETRIQ) tablet 25 mg (has no administration in time range)  tamsulosin (FLOMAX) capsule 0.4 mg (has no administration in time range)  divalproex (DEPAKOTE ER) 24 hr tablet 500 mg (has no administration  in time range)  latanoprost (XALATAN) 0.005 % ophthalmic solution 1 drop (has no administration in time range)  heparin injection 5,000 Units (has no administration in time range)  sodium zirconium cyclosilicate (LOKELMA) packet 10 g (has no administration in time range)  hydrALAZINE (APRESOLINE) tablet 25 mg (has no administration in time range)    ED Course/ Medical Decision Making/ A&P                           Medical Decision Making Amount and/or Complexity of Data Reviewed Labs: ordered.  Risk Decision regarding hospitalization.   81 year old male presents the emergency room for evaluation of abnormal labs, mainly hyponatremia.  Differential diagnosis includes is not limited to lab error versus hyponatremia versus pseudohyponatremia.  Vital signs show slightly elevated blood pressure otherwise afebrile, normal pulse rate, satting well on room air without increased work of breathing.  Physical exam as noted above.  We will reorder labs.  On prior chart investigation, patient had labs done on 05-11-2022 and shows a sodium of 122.  I independently reviewed and interpreted the patient's labs.  CMP shows significantly decreased sodium at 123.  Potassium mildly elevated at 5.3.  Chloride at 90.  Glucose at 127.  Creatinine 1.34.  No other LFT abnormalities.  Urinalysis shows pink, hazy urine.  There is some glucose with a moderate amount of hemoglobin.  There is 30 proteins.  Nitrite positive with large leuks and greater than 50 red blood cells and white blood cells with  many bacteria.  Consistent with UTI.  CBC without leukocytosis or anemia.  Osmolality pending.  I discussed with my attending, will order 500 mL bolus of normal saline.  Will admit to medicine for treatment of hyponatremia and UTI.  I do not think this patient needs any emergent hyper tonic solution of saline.  No seizure-like activity.  Wife reports he is at his baseline.  Discussed with patient and family member at bedside were amenable to admission.  We will admit to triad hospitalist.  I discussed this case with my attending physician who cosigned this note including patient's presenting symptoms, physical exam, and planned diagnostics and interventions. Attending physician stated agreement with plan or made changes to plan which were implemented.    Final Clinical Impression(s) / ED Diagnoses Final diagnoses:  Hyponatremia  Hyperkalemia  Acute cystitis with hematuria    Rx / DC Orders ED Discharge Orders     None         Achille Rich, PA-C 05/15/22 2006    Lonell Grandchild, MD 05/16/22 [phone]

## 2022-05-15 NOTE — Progress Notes (Signed)
Patient arrieved via bed with wife by his side. Denies pain or discomfort. Skin CDI.

## 2022-05-15 NOTE — ED Provider Triage Note (Cosign Needed Addendum)
Emergency Medicine Provider Triage Evaluation Note  Eric Barrett. , a 81 y.o. male  was evaluated in triage.  Pt complains of abnormal labs. The patient was seen at neurology for some tremors when they did some basic lab work.  He was sent over to his family practice for questionable UTI.  His wife reports that he tested positive for a UTI although has not had a chance to get these antibiotics yet.  Patient got these lab work done 4 days ago, wife was called last night for hyponatremia.  On lab work it appears the patient had lab work done on 05-11-2022 with a sodium of 122.  Reports he is at his baseline.  Review of Systems  Positive:  Negative:   Physical Exam  BP (!) 148/82 (BP Location: Right Arm)   Pulse 75   Temp 98.1 F (36.7 C)   Resp 14   SpO2 98%  Gen:   Awake, no distress   Resp:  Normal effort  MSK:   Moves extremities without difficulty  Other:    Medical Decision Making  Medically screening exam initiated at 10:57 AM.  Appropriate orders placed.  Eric Barrett. was informed that the remainder of the evaluation will be completed by another provider, this initial triage assessment does not replace that evaluation, and the importance of remaining in the ED until their evaluation is complete.  Labs ordered. Nursing alerted that patient needs to be roomed.    Sherrell Puller, PA-C 05/15/22 [place], [place] 05/15/22 1059

## 2022-05-15 NOTE — Telephone Encounter (Signed)
I called pt's wife to check status on this. She sts the pt remains the same no major changes. She reports she will be leaving at 900 today to be evaluated at [facility] ER.

## 2022-05-15 NOTE — Progress Notes (Signed)
Renal ultrasound unremarkable for kidneys, but PVR 342 ml.

## 2022-05-15 NOTE — H&P (Signed)
History and Physical    Eric Barrett. UMP:[id number] DOB: May 04, 1941 DOA: 05/15/2022  PCP: [name], FNP (Confirm with patient/family/NH records and if not entered, this has to be entered at [facility] point of entry) Patient coming from: Home  I have personally briefly reviewed patient's old medical records in [place]  Chief Complaint: Urine burns  HPI: Eric Barrett. is a 81 y.o. male with medical history significant of Alzheimer's dementia, HTN, HLD, chronic HFpEF, remote stroke with residual left-sided weakness, glaucoma, seizure disorder, brought in by family member for the elevation of after mentations and urinary symptoms.  Symptoms started about 1 to 2 weeks ago, insidious onset, patient started to have urinary frequency and episode of burning sensation.  About 4 to 5 weeks ago, patient developed severe constipation, and he was instructed to take more fluid " to correct dehydration", since then he has been taking extra amount of fluid usually most of the days, he takes somewhere from 6 to 8 of 16 ounces bottles of water.  4 days ago, patient developed dysuria with hematuria, and nocturnal urinary frequency, he went to bathroom almost every hour last night and complained about burning sensation when urinate.  Denies any back pain no fever chills.  He went to see PCP 4 days ago, blood work showed sodium 122, UA showed hematuria and pyuria, unfortunately, antibiotics was not available in the office and patient was ordered p.o. antibiotics however he has not started to take yet.  Last 2 to 3 days, family found the patient became more confused.  ED Course: Afebrile, blood pressure elevated not hypoxic.  Sodium 123, creatinine 1.3 compared to baseline less than 1.0, K5.3 bicarb 26 WBC 7.5. UA again shows hematuria> 50 RBC, and WBC> 50.   Review of Systems: As per HPI otherwise 14 point review of systems negative.    Past Medical History:  Diagnosis Date   CHF  (congestive heart failure) (HCC)    Glaucoma    Hemiparesis of left nondominant side (HCC)    History of shingles    HLD (hyperlipidemia)    Hypertension    Stroke [facility])    Tremor     Past Surgical History:  Procedure Laterality Date   No past surgery       reports that he has quit smoking. He has never used smokeless tobacco. He reports that he does not currently use alcohol. He reports that he does not use drugs.  No Known Allergies  Family History  Problem Relation Age of Onset   Heart attack Mother    Heart attack Father      Prior to Admission medications   Medication Sig Start Date End Date Taking? Authorizing Provider  amLODipine (NORVASC) 10 MG tablet Take 10 mg by mouth daily. 07/08/21  Yes [provider]  ASPIRIN 81 PO Take 81 mg by mouth daily.   Yes [provider]  Cholecalciferol (VITAMIN D3) 25 MCG (1000 UT) CAPS Take 1,000 Units by mouth daily.   Yes [provider]  divalproex (DEPAKOTE ER) 500 MG 24 hr tablet Take 1 tablet (500 mg total) by mouth at bedtime. 01/19/22  Yes [name], MD  docusate sodium (COLACE) 100 MG capsule Take 100 mg by mouth daily.   Yes [provider]  finasteride (PROSCAR) 5 MG tablet Take 5 mg by mouth daily. 08/24/21  Yes [provider]  GAVILAX 17 GM/SCOOP powder Take 17 g by mouth daily. 04/26/22  Yes [provider]  losartan (COZAAR) 100 MG tablet Take 100 mg by mouth daily. 10/29/20  Yes [provider]  memantine (NAMENDA) 10 MG tablet Take 10 mg by mouth 2 (two) times daily. 02/08/22  Yes [provider]  MOVANTIK 25 MG TABS tablet Take 25 mg by mouth daily. 05/04/22  Yes [provider]  multivitamin-lutein (OCUVITE-LUTEIN) CAPS capsule Take 1 capsule by mouth daily.   Yes [provider]  MYRBETRIQ 25 MG TB24 tablet Take 25 mg by mouth daily. 11/02/21  Yes [provider]  rosuvastatin (CRESTOR) 20 MG tablet Take 20 mg by mouth  daily.   Yes [provider]  tamsulosin (FLOMAX) 0.4 MG CAPS capsule Take 0.4 mg by mouth daily. 10/29/20  Yes [provider]  Travoprost, BAK Free, (TRAVATAN) 0.004 % SOLN ophthalmic solution Place 1 drop into both eyes at bedtime.   Yes [provider]    Physical Exam: Vitals:   05/15/22 1055 05/15/22 1135  BP: (!) 148/82 (!) 169/86  Pulse: 75 68  Resp: 14 16  Temp: 98.1 F (36.7 C)   SpO2: 98% 97%    Constitutional: NAD, calm, comfortable Vitals:   05/15/22 1055 05/15/22 1135  BP: (!) 148/82 (!) 169/86  Pulse: 75 68  Resp: 14 16  Temp: 98.1 F (36.7 C)   SpO2: 98% 97%   Eyes: PERRL, lids and conjunctivae normal ENMT: Mucous membranes are moist. Posterior pharynx clear of any exudate or lesions.Normal dentition.  Neck: normal, supple, no masses, no thyromegaly Respiratory: clear to auscultation bilaterally, no wheezing, no crackles. Normal respiratory effort. No accessory muscle use.  Cardiovascular: Regular rate and rhythm, no murmurs / rubs / gallops. No extremity edema. 2+ pedal pulses. No carotid bruits.  Abdomen: no tenderness, no masses palpated. No hepatosplenomegaly. Bowel sounds positive.  Musculoskeletal: no clubbing / cyanosis. No joint deformity upper and lower extremities. Good ROM, no contractures. Normal muscle tone.  Skin: no rashes, lesions, ulcers. No induration Neurologic: CN 2-12 grossly intact. Sensation intact, DTR normal. Strength 5/5 in all 4.  Psychiatric: Awake, oriented to person and place, confused about time    Labs on Admission: I have personally reviewed following labs and imaging studies  CBC: Recent Labs  Lab 05/11/22 0913 05/15/22 1055  WBC 7.6 5.5  NEUTROABS 6.2 3.7  HGB 14.3 13.6  HCT 41.6 39.3  MCV 80 80.2  PLT 231 102   Basic Metabolic Panel: Recent Labs  Lab 05/11/22 0913 05/15/22 1055  NA 122* 123*  K 5.5* 5.3*  CL 82* 90*  CO2 20 26  GLUCOSE 156* 127*  BUN 12 11  CREATININE 1.20  1.34*  CALCIUM 9.4 9.1   GFR: CrCl cannot be calculated (Unknown ideal weight.). Liver Function Tests: Recent Labs  Lab 05/11/22 0913 05/15/22 1055  AST 38 30  ALT 21 19  ALKPHOS 53 41  BILITOT 0.4 0.5  PROT 7.2 7.0  ALBUMIN 4.2 3.5   No results for input(s): "LIPASE", "AMYLASE" in the last 168 hours. No results for input(s): "AMMONIA" in the last 168 hours. Coagulation Profile: No results for input(s): "INR", "PROTIME" in the last 168 hours. Cardiac Enzymes: No results for input(s): "CKTOTAL", "CKMB", "CKMBINDEX", "TROPONINI" in the last 168 hours. BNP (last 3 results) No results for input(s): "PROBNP" in the last 8760 hours. HbA1C: No results for input(s): "HGBA1C" in the last 72 hours. CBG: No results for input(s): "GLUCAP" in the last 168 hours. Lipid Profile: No results for input(s): "CHOL", "  HDL", "LDLCALC", "TRIG", "CHOLHDL", "LDLDIRECT" in the last 72 hours. Thyroid Function Tests: No results for input(s): "TSH", "T4TOTAL", "FREET4", "T3FREE", "THYROIDAB" in the last 72 hours. Anemia Panel: No results for input(s): "VITAMINB12", "FOLATE", "FERRITIN", "TIBC", "IRON", "RETICCTPCT" in the last 72 hours. Urine analysis:    Component Value Date/Time   COLORURINE PINK (A) 05/15/2022 1109   APPEARANCEUR HAZY (A) 05/15/2022 1109   LABSPEC 1.005 05/15/2022 1109   PHURINE 7.0 05/15/2022 1109   GLUCOSEU 50 (A) 05/15/2022 1109   HGBUR MODERATE (A) 05/15/2022 1109   BILIRUBINUR NEGATIVE 05/15/2022 1109   KETONESUR NEGATIVE 05/15/2022 1109   PROTEINUR 30 (A) 05/15/2022 1109   NITRITE POSITIVE (A) 05/15/2022 1109   LEUKOCYTESUR LARGE (A) 05/15/2022 1109    Radiological Exams on Admission: No results found.  EKG: Ordered  Assessment/Plan Principal Problem:   Hyponatremia  (please populate well all problems here in Problem List. (For example, if patient is on BP meds at home and you resume or decide to hold them, it is a problem that needs to be her. Same for CAD,  COPD, HLD and so on)  Acute metabolic encephalopathy -Likely secondary to UTI, hyponatremia pewter to be subacute-chronic, less likely to contribute patient mentation changes. -Start ceftriaxone -Symptoms improving, no indication for further brain imaging. -Avoid sedation medications  Hyponatremia, subacute-chronic -Likely secondary to polydipsia of free water -Clinically patient euvolemic -Check serum sodium, urine osmolarity and urine sodium -Enforce fluid restriction 1800 mL/day -Repeat sodium level tonight and tomorrow morning, plan to correct sodium level less than 2.5 mEq IV. -Other DDx, patient on chronic Depakote for at least 1 year, and his sodium level was 140 at baseline until 4 months ago, less likely Depakote contributed to his hyponatremia at this point.  Decided to continue Depakote.  AKI -Etiology less clear, given the patient has new onset UTI and hematuria, will check renal ultrasound -Recheck UA tomorrow, may need outpatient urology follow-up  Hyperkalemia -Mild, EKG pending -1 dose of p.o. Lokelma -BMP in AM  Complicated UTI -With acute metabolic encephalopathy, improved, ceftriaxone -Check PVR  BPH -Continue Proscar and Flomax  Chronic HFpEF -Blood pressure poorly controlled, resume amlodipine, hold off losartan for AKI -Start as needed hydralazine  Seizure disorder -Decided to continue with Depakote as mentioned above  Advanced dementia -Treat UTI then reevaluate, continue memantine and Aricept  DVT prophylaxis: Heparin subcu Code Status: Full code Family Communication: Wife and daughter Disposition Plan: Patient sick with hyponatremia and complicated UTI with mentation changes, requiring IV antibiotics and close monitoring sodium correction, expect more than 2 midnight hospital stay. Consults called: None Admission status: MedSurg admission   Lequita Halt MD [facility] Pager [phone]  05/15/2022, 1:33 PM

## 2022-05-16 DIAGNOSIS — Z7189 Other specified counseling: Secondary | ICD-10-CM

## 2022-05-16 DIAGNOSIS — N3001 Acute cystitis with hematuria: Secondary | ICD-10-CM

## 2022-05-16 DIAGNOSIS — N1832 Chronic kidney disease, stage 3b: Secondary | ICD-10-CM

## 2022-05-16 DIAGNOSIS — I5032 Chronic diastolic (congestive) heart failure: Secondary | ICD-10-CM

## 2022-05-16 DIAGNOSIS — N179 Acute kidney failure, unspecified: Secondary | ICD-10-CM

## 2022-05-16 DIAGNOSIS — E875 Hyperkalemia: Secondary | ICD-10-CM

## 2022-05-16 DIAGNOSIS — G309 Alzheimer's disease, unspecified: Secondary | ICD-10-CM | POA: Diagnosis not present

## 2022-05-16 DIAGNOSIS — Z8673 Personal history of transient ischemic attack (TIA), and cerebral infarction without residual deficits: Secondary | ICD-10-CM

## 2022-05-16 DIAGNOSIS — F0280 Dementia in other diseases classified elsewhere without behavioral disturbance: Secondary | ICD-10-CM

## 2022-05-16 DIAGNOSIS — E871 Hypo-osmolality and hyponatremia: Secondary | ICD-10-CM | POA: Diagnosis not present

## 2022-05-16 DIAGNOSIS — Z515 Encounter for palliative care: Secondary | ICD-10-CM

## 2022-05-16 LAB — BASIC METABOLIC PANEL WITH GFR
Anion gap: 12 (ref 5–15)
BUN: 13 mg/dL (ref 8–23)
CO2: 23 mmol/L (ref 22–32)
Calcium: 9.3 mg/dL (ref 8.9–10.3)
Chloride: 88 mmol/L — ABNORMAL LOW (ref 98–111)
Creatinine, Ser: 2.14 mg/dL — ABNORMAL HIGH (ref 0.61–1.24)
GFR, Estimated: 30 mL/min — ABNORMAL LOW
Glucose, Bld: 141 mg/dL — ABNORMAL HIGH (ref 70–99)
Potassium: 5.3 mmol/L — ABNORMAL HIGH (ref 3.5–5.1)
Sodium: 123 mmol/L — ABNORMAL LOW (ref 135–145)

## 2022-05-16 LAB — BASIC METABOLIC PANEL
Anion gap: 11 (ref 5–15)
Anion gap: 10 (ref 5–15)
Anion gap: 10 (ref 5–15)
Anion gap: 10 (ref 5–15)
BUN: 17 mg/dL (ref 8–23)
BUN: 21 mg/dL (ref 8–23)
BUN: 27 mg/dL — ABNORMAL HIGH (ref 8–23)
BUN: 32 mg/dL — ABNORMAL HIGH (ref 8–23)
CO2: 22 mmol/L (ref 22–32)
CO2: 22 mmol/L (ref 22–32)
CO2: 22 mmol/L (ref 22–32)
CO2: 22 mmol/L (ref 22–32)
Calcium: 9.2 mg/dL (ref 8.9–10.3)
Calcium: 8.7 mg/dL — ABNORMAL LOW (ref 8.9–10.3)
Calcium: 8.6 mg/dL — ABNORMAL LOW (ref 8.9–10.3)
Calcium: 8.8 mg/dL — ABNORMAL LOW (ref 8.9–10.3)
Chloride: 89 mmol/L — ABNORMAL LOW (ref 98–111)
Chloride: 91 mmol/L — ABNORMAL LOW (ref 98–111)
Chloride: 91 mmol/L — ABNORMAL LOW (ref 98–111)
Chloride: 92 mmol/L — ABNORMAL LOW (ref 98–111)
Creatinine, Ser: 2.36 mg/dL — ABNORMAL HIGH (ref 0.61–1.24)
Creatinine, Ser: 2.45 mg/dL — ABNORMAL HIGH (ref 0.61–1.24)
Creatinine, Ser: 2.48 mg/dL — ABNORMAL HIGH (ref 0.61–1.24)
Creatinine, Ser: 2.42 mg/dL — ABNORMAL HIGH (ref 0.61–1.24)
GFR, Estimated: 27 mL/min — ABNORMAL LOW (ref >60)
GFR, Estimated: 26 mL/min — ABNORMAL LOW (ref >60)
GFR, Estimated: 25 mL/min — ABNORMAL LOW (ref >60)
GFR, Estimated: 26 mL/min — ABNORMAL LOW (ref >60)
Glucose, Bld: 136 mg/dL — ABNORMAL HIGH (ref 70–99)
Glucose, Bld: 157 mg/dL — ABNORMAL HIGH (ref 70–99)
Glucose, Bld: 151 mg/dL — ABNORMAL HIGH (ref 70–99)
Glucose, Bld: 226 mg/dL — ABNORMAL HIGH (ref 70–99)
Potassium: 6.6 mmol/L (ref 3.5–5.1)
Potassium: 5 mmol/L (ref 3.5–5.1)
Potassium: 4.8 mmol/L (ref 3.5–5.1)
Potassium: 4.2 mmol/L (ref 3.5–5.1)
Sodium: 122 mmol/L — ABNORMAL LOW (ref 135–145)
Sodium: 123 mmol/L — ABNORMAL LOW (ref 135–145)
Sodium: 123 mmol/L — ABNORMAL LOW (ref 135–145)
Sodium: 124 mmol/L — ABNORMAL LOW (ref 135–145)

## 2022-05-16 LAB — GLUCOSE, CAPILLARY: Glucose-Capillary: 160 mg/dL — ABNORMAL HIGH (ref 70–99)

## 2022-05-16 LAB — CBC
HCT: 37.7 % — ABNORMAL LOW (ref 39.0–52.0)
Hemoglobin: 13.4 g/dL (ref 13.0–17.0)
MCH: 27.9 pg (ref 26.0–34.0)
MCHC: 35.5 g/dL (ref 30.0–36.0)
MCV: 78.5 fL — ABNORMAL LOW (ref 80.0–100.0)
Platelets: 205 K/uL (ref 150–400)
RBC: 4.8 MIL/uL (ref 4.22–5.81)
RDW: 13.7 % (ref 11.5–15.5)
WBC: 35.5 K/uL — ABNORMAL HIGH (ref 4.0–10.5)
nRBC: 0 % (ref 0.0–0.2)

## 2022-05-16 LAB — SODIUM, URINE, RANDOM: Sodium, Ur: 59 mmol/L

## 2022-05-16 LAB — CORTISOL-AM, BLOOD: Cortisol - AM: 43.1 ug/dL — ABNORMAL HIGH (ref 6.7–22.6)

## 2022-05-16 LAB — OSMOLALITY, URINE: Osmolality, Ur: 345 mOsm/kg (ref 300–900)

## 2022-05-16 LAB — TSH: TSH: 1.155 u[IU]/mL (ref 0.350–4.500)

## 2022-05-16 MED ORDER — SODIUM BICARBONATE 8.4 % IV SOLN
50.0000 meq | Freq: Once | INTRAVENOUS | Status: AC
  Administered 2022-05-16: 50 meq via INTRAVENOUS
  Filled 2022-05-16: qty 50

## 2022-05-16 MED ORDER — CALCIUM GLUCONATE-NACL 1-0.675 GM/50ML-% IV SOLN
1.0000 g | Freq: Once | INTRAVENOUS | Status: AC
  Administered 2022-05-16: 1000 mg via INTRAVENOUS
  Filled 2022-05-16: qty 50

## 2022-05-16 MED ORDER — DEXTROSE 50 % IV SOLN
25.0000 mL | Freq: Once | INTRAVENOUS | Status: AC
  Administered 2022-05-16: 25 mL via INTRAVENOUS
  Filled 2022-05-16: qty 50

## 2022-05-16 MED ORDER — CALCIUM GLUCONATE 10 % IV SOLN
1.0000 g | Freq: Once | INTRAVENOUS | Status: DC
  Filled 2022-05-16 (×3): qty 10

## 2022-05-16 MED ORDER — LINACLOTIDE 145 MCG PO CAPS
145.0000 ug | ORAL_CAPSULE | Freq: Every day | ORAL | Status: DC
  Administered 2022-05-17 – 2022-05-24 (×8): 145 ug via ORAL
  Filled 2022-05-16 (×9): qty 1

## 2022-05-16 MED ORDER — SODIUM ZIRCONIUM CYCLOSILICATE 10 G PO PACK
10.0000 g | PACK | Freq: Once | ORAL | Status: AC
  Administered 2022-05-16: 10 g via ORAL
  Filled 2022-05-16: qty 1

## 2022-05-16 MED ORDER — SODIUM CHLORIDE 0.9 % IV SOLN: INTRAVENOUS | Status: DC

## 2022-05-16 MED ORDER — SODIUM ZIRCONIUM CYCLOSILICATE 10 G PO PACK
10.0000 g | PACK | Freq: Every day | ORAL | Status: DC
  Administered 2022-05-17 – 2022-05-19 (×3): 10 g via ORAL
  Filled 2022-05-16 (×3): qty 1

## 2022-05-16 MED ORDER — INSULIN REGULAR BOLUS VIA INFUSION
5.0000 [IU] | Freq: Once | INTRAVENOUS | Status: AC
  Administered 2022-05-16: 5 [IU] via INTRAVENOUS
  Filled 2022-05-16: qty 5

## 2022-05-16 MED ORDER — ALBUTEROL SULFATE (2.5 MG/3ML) 0.083% IN NEBU
2.5000 mg | INHALATION_SOLUTION | Freq: Once | RESPIRATORY_TRACT | Status: AC
  Administered 2022-05-16: 2.5 mg via RESPIRATORY_TRACT
  Filled 2022-05-16: qty 3

## 2022-05-16 NOTE — Progress Notes (Signed)
PROGRESS NOTE    Eric Barrett.  FHL:[id number] DOB: September 27, 1940 DOA: 05/15/2022 PCP: [name], FNP   Brief Narrative:  81 y.o. male with medical history significant of Alzheimer's dementia, HTN, HLD, chronic HFpEF, remote stroke with residual left-sided weakness, glaucoma, seizure disorder presented with altered mental status and dysuria.  Apparently, patient had been drinking 6 to 8 16 ounces bottles of water recently.  He saw his PCP 4 days ago, blood work showed sodium of 122, UA showed hematuria and pyuria, p.o. antibiotics was ordered however patient had not started antibiotics yet.  Patient presented to the ED more confused and sodium was 123, creatinine 1.3, potassium 5.3 with UA showing hematuria and pyuria again.  He was started on Rocephin.  Assessment & Plan:   Acute metabolic encephalopathy History of Alzheimer's dementia -Possibly from UTI and hyponatremia -Monitor mental status.  Fall precautions.  Symptoms apparently improving hence no brain imaging was performed on admission. -PT eval -Continue memantine  Acute on chronic hyponatremia -Likely secondary to polydipsia -Sodium morning is still 123 despite fluid restriction. -We will start IV fluids for now.  Repeat BMP every 4 hours for now.  Nephrology evaluation. -Depakote has been continued for now since patient has been on chronic Depakote for at least a year and a sodium level was 140 at baseline until 4 months ago  AKI on CKD stage IIIb -Creatinine 1.34 on presentation.  Worsening to 2.36 this morning. -Continue IV fluids.  Renal ultrasound was negative for hydronephrosis. -Repeat a.m. labs.  Follow nephrology recommendations  Hyperkalemia -Potassium 6.6 this morning.  Will treat with oral Lokelma, IV calcium gluconate/1 amp of sodium bicarbonate/insulin and dextrose 50% and albuterol nebulization.  Check EEG.  Repeat BMP every 4 hours for now.  Possible UTI: Present on admission -Follow urine  cultures.  Continue Rocephin  Leukocytosis -WBCs have jumped to 35.5.  Follow urine cultures.  Check blood cultures as well.  We will also check chest x-ray.  BPH -Continue Proscar and Flomax.  Outpatient follow-up with urology  Chronic diastolic CHF -Currently compensated.  Blood pressure on the lower side.  Strict input and output.  Daily weights.  Losartan on hold because of AKI  Hypertension--continue amlodipine for now.  Losartan on hold  Seizure disorder -Continue Depakote.  Outpatient follow-up with neurology  Goals of care --Palliative care consultation for goals of care discussion.  Currently listed as full code  DVT prophylaxis: Heparin subcutaneous Code Status: Full Family Communication: None at bedside Disposition Plan: Status is: Inpatient Remains inpatient appropriate because: Of severity of illness  Consultants: Nephrology/palliative care  Procedures: None  Antimicrobials: Rocephin from 05/15/2022 onwards   Subjective: Patient seen and examined at bedside.  Awake, slow to respond, poor historian, slightly confused.  No overnight fever, seizures, vomiting reported.  Objective: Vitals:   05/15/22 2225 05/16/22 0018 05/16/22 0520 05/16/22 0818  BP: 115/69 90/62 103/61 104/62  Pulse: (!) 111 98 91 90  Resp:  '18 17 18  '$ Temp:  97.9 F (36.6 C) (!) 97.5 F (36.4 C) 98.5 F (36.9 C)  TempSrc:  Oral  Oral  SpO2: 95% 95% 96% 97%  Weight:      Height:        Intake/Output Summary (Last 24 hours) at 05/16/2022 1134 Last data filed at 05/16/2022 0900 Gross per 24 hour  Intake 118 ml  Output --  Net 118 ml   Filed Weights   05/15/22 1820  Weight: 82.2 kg  Examination:  General exam: Appears calm and comfortable.  Currently on room air.  Looks chronically ill and deconditioned Respiratory system: Bilateral decreased breath sounds at bases with some scattered crackles Cardiovascular system: S1 & S2 heard, Rate controlled Gastrointestinal system:  Abdomen is nondistended, soft and nontender. Normal bowel sounds heard. Extremities: No cyanosis, clubbing; trace lower extremity edema Central nervous system: Awake, slow to respond, poor historian, slightly confused. No focal neurological deficits. Moving extremities Skin: No rashes, lesions or ulcers Psychiatry: Mostly flat affect.  Currently not agitated.   Data Reviewed: I have personally reviewed following labs and imaging studies  CBC: Recent Labs  Lab 05/11/22 0913 05/15/22 1055 05/16/22 0253  WBC 7.6 5.5 35.5*  NEUTROABS 6.2 3.7  --   HGB 14.3 13.6 13.4  HCT 41.6 39.3 37.7*  MCV 80 80.2 78.5*  PLT 231 223 093   Basic Metabolic Panel: Recent Labs  Lab 05/11/22 0913 05/15/22 1055 05/15/22 2050 05/16/22 0253 05/16/22 0738  NA 122* 123* 122* 123* 122*  K 5.5* 5.3*  --  5.3* 6.6*  CL 82* 90*  --  88* 89*  CO2 20 26  --  23 22  GLUCOSE 156* 127*  --  141* 136*  BUN 12 11  --  13 17  CREATININE 1.20 1.34*  --  2.14* 2.36*  CALCIUM 9.4 9.1  --  9.3 9.2   GFR: Estimated Creatinine Clearance: 25.7 mL/min (A) (by C-G formula based on SCr of 2.36 mg/dL (H)). Liver Function Tests: Recent Labs  Lab 05/11/22 0913 05/15/22 1055  AST 38 30  ALT 21 19  ALKPHOS 53 41  BILITOT 0.4 0.5  PROT 7.2 7.0  ALBUMIN 4.2 3.5   No results for input(s): "LIPASE", "AMYLASE" in the last 168 hours. No results for input(s): "AMMONIA" in the last 168 hours. Coagulation Profile: No results for input(s): "INR", "PROTIME" in the last 168 hours. Cardiac Enzymes: No results for input(s): "CKTOTAL", "CKMB", "CKMBINDEX", "TROPONINI" in the last 168 hours. BNP (last 3 results) No results for input(s): "PROBNP" in the last 8760 hours. HbA1C: No results for input(s): "HGBA1C" in the last 72 hours. CBG: No results for input(s): "GLUCAP" in the last 168 hours. Lipid Profile: No results for input(s): "CHOL", "HDL", "LDLCALC", "TRIG", "CHOLHDL", "LDLDIRECT" in the last 72 hours. Thyroid  Function Tests: No results for input(s): "TSH", "T4TOTAL", "FREET4", "T3FREE", "THYROIDAB" in the last 72 hours. Anemia Panel: No results for input(s): "VITAMINB12", "FOLATE", "FERRITIN", "TIBC", "IRON", "RETICCTPCT" in the last 72 hours. Sepsis Labs: No results for input(s): "PROCALCITON", "LATICACIDVEN" in the last 168 hours.  No results found for this or any previous visit (from the past 240 hour(s)).       Radiology Studies: US RENAL  Result Date: 05/15/2022 CLINICAL DATA:  Acute kidney injury. EXAM: RENAL / URINARY TRACT ULTRASOUND COMPLETE COMPARISON:  CT abdomen dated 08/26/2021. FINDINGS: Right Kidney: Renal measurements: 9.9 x 5 x 5.1 cm = volume: 131 mL. Cortical thickness and echogenicity is within normal limits. 2 renal cysts are identified, measuring 1.6 cm and 1.1 cm respectively. No follow-up imaging is recommended for these benign findings. No suspicious mass or hydronephrosis. Left Kidney: Renal measurements: 9.2 x 4.7 x 4.2 cm = volume: 96 mL. Cortical thickness and echogenicity within normal limits. No mass or hydronephrosis. Bladder: Appears normal for degree of bladder distention. Patient voided just prior to exam. Postvoid volume measured at 342 cc. Other: None. IMPRESSION: 1. Kidneys are normal in size and appearance. No hydronephrosis. 2. Bladder  is unremarkable. Patient voided just prior to exam. Postvoid volume measured at 342 cc. Electronically Signed   By: Franki Cabot M.D.   On: 05/15/2022 14:51        Scheduled Meds:  amLODipine  10 mg Oral Daily   aspirin  81 mg Oral Daily   calcium gluconate  1 g Intravenous Once   divalproex  500 mg Oral QHS   docusate sodium  100 mg Oral Daily   finasteride  5 mg Oral Daily   heparin  5,000 Units Subcutaneous Q12H   latanoprost  1 drop Both Eyes QHS   memantine  10 mg Oral BID   mirabegron ER  25 mg Oral Daily   polyethylene glycol  17 g Oral Daily   rosuvastatin  20 mg Oral Daily   tamsulosin  0.4 mg Oral Daily    Continuous Infusions:  sodium chloride 125 mL/hr at 05/16/22 0829   cefTRIAXone (ROCEPHIN)  IV            Aline August, MD [facility] 05/16/2022, 11:34 AM

## 2022-05-16 NOTE — Consult Note (Cosign Needed Addendum)
Palliative Care Consult Note                                  Date: 05/16/2022   Patient Name: Eric Barrett.  DOB: [birth year]  MRN: [id number]  Age / Sex: 81 y.o., male  PCP: Raymon Mutton., FNP Referring Physician: Glade Lloyd, MD  Reason for Consultation: Establishing goals of care  HPI/Patient Profile: 81 y.o. male  with past medical history of Alzheimer's dementia, HTN, HLD, chronic HFpEF, remote stroke with residual left-sided weakness, glaucoma, seizure disorder presented with altered mental status and dysuria. Has been drinking a lot of water due to ongoing constipation. He was admitted on 05/15/2022 with acute metabolic encephalopathy, acute on chronic hyponatremia, AKI on CKD 3B, hyperkalemia, UTI, leukocytosis, and others.   Past Medical History:  Diagnosis Date   CHF (congestive heart failure) (HCC)    Glaucoma    Hemiparesis of left nondominant side (HCC)    History of shingles    HLD (hyperlipidemia)    Hypertension    Stroke (HCC)    Tremor     Subjective:   This NP Wynne Dust reviewed medical records, received report from team, assessed the patient and then meet at the patient's bedside to discuss diagnosis, prognosis, GOC, EOL wishes disposition and options.  I met with the patient and his wife and Montesinos at the bedside..   Concept of Palliative Care was introduced as specialized medical care for people and their families living with serious illness.  If focuses on providing relief from the symptoms and stress of a serious illness.  The goal is to improve quality of life for both the patient and the family. Values and goals of care important to patient and family were attempted to be elicited.  Created space and opportunity for patient  and family to explore thoughts and feelings regarding current medical situation   Natural trajectory and current clinical status were discussed. Questions and concerns  addressed. Patient  encouraged to call with questions or concerns.    Patient/Family Understanding of Illness: They understand his sodium is low and thinks it is because he drank too much water secondary to constipation.  His PCP encouraged him to drink plenty of water with Metamucil and he was drinking an additional 6-day bottles on top of what he had been drinking.  They also know he has dementia and his wife has noted that this is been progressing quite fast.  He was diagnosed a couple years ago.  His wife describes his dementia progression is "scary".  At baseline he has been having some functional decline as well.  He has difficulty with bathing and depends on his wife for assistance with many things including getting to the bathroom, dressing, etc.  She notes that it is getting harder for her to care for him at home.  We had further discussion about his current admitted diagnoses including UTI, drastically increased white blood cell count, AKI that is slowly increasing, hyperkalemia, hyponatremia, and others.  Life Review: The patient and his wife have been married for 34 years.  They do not have any children together as she has 5 children from a previous marriage.  Together they have 30-40 grandchildren and 10-15 great-grandchildren.  Most of the family remains involved in her life.  They are spiritual and identifies [employer] although his wife currently goes to an [employer].  Her family was  visiting earlier in the minister is supposed to be coming later today.  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    Things that bring him happiness include music, family, and going to the gym although he is not able to do this much recently.  His wife did buy him some home  weight equipment and exercise bands to help.  Patient Values: Family, faith, getting back to independence  Goals: To get better from acute illnesses and attempt to get stronger and get home.  Today's Discussion: In addition to discussion described above we had extensive and prolonged discussions on multiple topics.  We discussed the interplay about several of his diagnoses including his AKI and electrolyte abnormalities, UTI along with noted shaking and confusion.  We noted current treatments including antibiotics and specialist consults.  We are using medications to help with the electrolytes.  During our visit the nephrologist came in to see the patient.  We discussed the patient's recent constipation for which she was drinking copious amounts of water and his wife feels is related to his low sodium.  They note that at home he has tried Metamucil, MiraLAX, daily suppository, stool softener, and milk of magnesia.  None of this seems to be very effective.  The patient denies overt abdominal pain which supports lack of an actual bowel obstruction.  However, his abdomen is a bit distended.  I discussed with the nephrologist and hospitalist and we will try to add Linzess to see if this can produce any better bowel movements.  We discussed his chronic illnesses and the trajectory of these.  Specifically, we spent a large amount of time talking about Alzheimer's disease and the progressive, incurable nature of this.  We discussed that there are many options as his care progresses.  We discussed that they will always have decisions and there are no right/wrong answers.  However, I explained the difference between full scope of care/aggressive care and more comfort focused approach.  We specifically spent time discussing CODE STATUS.  The patient is oriented x3 and seems to have capacity at this point.  We discussed CODE STATUS and all the pros and cons to come with resuscitative efforts.  At the end the  patient agreed that he wants full scope of care and full code.  He states he would even be willing to consider prolonged artificial support.  His wife seems a little surprised by this.  We discussed that it depending on how his health gets moving forward we may have to revisit these discussions and he is in agreement with this.  He also confirms that he would want his wife to be his decision maker if he is unable to make his decisions.  Finally, his wife asked about a possible bedside commode for the home to help with toileting.  I explained that I would reach out to [facility] to discuss this.  I also explained that a PT consult has been ordered and they will help decide what is best for him regards to rehab and strengthening (SNF versus home therapy).  I provided emotional and general support through therapeutic listening, empathy, sharing of stories, and other techniques. I answered all questions and addressed all concerns to the best of my ability.  Review of Systems  Respiratory:  Negative for cough and shortness of breath.   Cardiovascular:  Negative for chest pain.  Gastrointestinal:  Positive for constipation. Negative for abdominal pain, nausea and vomiting.  Neurological:  Positive for weakness.    Objective:   Primary Diagnoses: Present on Admission: **None**   Physical Exam Vitals and nursing note reviewed.  Constitutional:      General: He is not in acute distress. HENT:     Head: Normocephalic and atraumatic.  Cardiovascular:     Rate and Rhythm: Normal rate.  Pulmonary:     Effort: Pulmonary effort is normal. No respiratory distress.  Abdominal:     General: There is distension (mild).     Tenderness: There is no abdominal tenderness.  Skin:    General: Skin is warm and dry.  Neurological:     Mental Status: He is alert and oriented to person, place, and time.  Psychiatric:        Mood and Affect: Mood normal.        Behavior: Behavior normal.     Vital Signs:  BP  (!) 94/52 (BP Location: Left Arm)   Pulse 87   Temp 98.4 F (36.9 C) (Oral)   Resp 17   Ht $R'5\' 8"'gb$  (1.727 m)   Wt 82.2 kg   SpO2 96%   BMI 27.55 kg/m   Palliative Assessment/Data: 40%    Advanced Care Planning:   Primary Decision Maker: PATIENT  Code Status/Advance Care Planning: Full code  A discussion was had today regarding advanced directives. Concepts specific to code status, artifical feeding and hydration, continued IV antibiotics and rehospitalization was had.  The difference between a aggressive medical intervention path and a palliative comfort care path for this patient at this time was had.   Decisions/Changes to ACP: Remain full code No changes today  Assessment & Plan:   Impression: 81 year old male with acute and chronic illnesses.  He was admitted for hyponatremia, hyperkalemia, AKI on CKD, UTI with drastically rising white blood cell count.  He is currently being treated.  He seems to be not in pain and denies pain, nausea, vomiting.  He does have chronic constipation and we will try to address this.  We had a good and lengthy discussion about trajectory of illness, CODE STATUS, goals of care.  We will continue to follow along.  Overall prognosis guarded.  SUMMARY OF RECOMMENDATIONS   Remain full code Full scope of care Time for outcomes Further [place] discussions pending evolution of clinical picture Spiritual care consult Discussion with TOC about possible bedside commode at home Start Linzess 145 mcg daily for refractory constipation Await PT recommendations PMT will continue to follow  Symptom Management:  Linzess 145 mcg daily for refractory constipation  Prognosis:  Unable to determine  Discharge Planning:  To Be Determined   Discussed with: Patient, family, medical team, nursing team, [facility] team, chaplain    Thank you for allowing [place] to participate in the care of Eric Barrett. PMT will continue to support holistically.  Time Total:  150 min  Greater than 50%  of this time was spent counseling and coordinating care related to the above assessment and plan.  Signed by: Walden Field, NP Palliative Medicine Team  Team Phone # [phone] (Nights/Weekends)  05/16/2022, 4:10 PM

## 2022-05-16 NOTE — Evaluation (Signed)
Physical Therapy Evaluation Patient Details Name: Eric Barrett. MRN: [id number] DOB: [birth year] Today's Date: 05/16/2022  History of Present Illness  81 yo male presents to [facility] on 10/9 with hyponatremia, AMS, dysuria. PMH includes dementia, HTN, HLD, chronic HFpEF, remote stroke with residual left-sided weakness, glaucoma, seizure disorder.  Clinical Impression  Pt presents with generalized weakness, bodywide tremors/jerking in standing, impaired balance at high risk for falls, and decreased activity tolerance. Pt to benefit from acute PT to address deficits. Pt requiring mod +2 assist for transfer-level mobility at this time, per pt's wife and pt, pt has had a very difficult time mobilizing and managing at home. PT recommending ST-SNF to address deficits. PT to progress mobility as tolerated, and will continue to follow acutely.         Recommendations for follow up therapy are one component of a multi-disciplinary discharge planning process, led by the attending physician.  Recommendations may be updated based on patient status, additional functional criteria and insurance authorization.  Follow Up Recommendations Skilled nursing-short term rehab (<3 hours/day) Can patient physically be transported by private vehicle: Yes    Assistance Recommended at Discharge Frequent or constant Supervision/Assistance  Patient can return home with the following  A lot of help with walking and/or transfers;A lot of help with bathing/dressing/bathroom    Equipment Recommendations None recommended by PT  Recommendations for Other Services       Functional Status Assessment Patient has had a recent decline in their functional status and demonstrates the ability to make significant improvements in function in a reasonable and predictable amount of time.     Precautions / Restrictions Precautions Precautions: Fall Precaution Comments: bodywide tremors/shaking in WB Restrictions Weight Bearing  Restrictions: No      Mobility  Bed Mobility Overal bed mobility: Needs Assistance Bed Mobility: Sit to Supine       Sit to supine: Mod assist, +2 for physical assistance   General bed mobility comments: mod +2 for LE lift into bed, trunk lowering, and boost up    Transfers Overall transfer level: Needs assistance Equipment used: 2 person hand held assist Transfers: Sit to/from Stand, Bed to chair/wheelchair/BSC Sit to Stand: Mod assist, +2 physical assistance   Step pivot transfers: Mod assist, +2 physical assistance       General transfer comment: mod +2 for power up, rise, blocking LEs to lessen shaking. STS x2 from [facility], x1 step pivot bed with significant steadying assist and slow eccentric lower help    Ambulation/Gait                  Stairs            Wheelchair Mobility    Modified Rankin (Stroke Patients Only)       Balance Overall balance assessment: Needs assistance Sitting-balance support: Feet supported, No upper extremity supported Sitting balance-Leahy Scale: Fair     Standing balance support: Bilateral upper extremity supported, During functional activity Standing balance-Leahy Scale: Poor                               Pertinent Vitals/Pain Pain Assessment Pain Assessment: No/denies pain    Home Living Family/patient expects to be discharged to:: Private residence Living Arrangements: Spouse/significant other Available Help at Discharge: Family Type of Home: Apartment Home Access: Level entry       Home Layout: One level Home Equipment: Conservation officer, nature (2 wheels)  Prior Function Prior Level of Function : Needs assist             Mobility Comments: per pt and wife, pt mobilizing independently prior to two weeks ago when tremors started, now is requiring assist for all transfers and use of RW ADLs Comments: pt's wife assist pt with washup - over the past two weeks pt sitting on the toilet to wash up  and pt's wife bathes him     Hand Dominance   Dominant Hand: Right    Extremity/Trunk Assessment   Upper Extremity Assessment Upper Extremity Assessment: Defer to OT evaluation    Lower Extremity Assessment Lower Extremity Assessment: Generalized weakness;LLE deficits/detail LLE Deficits / Details: history of CVA LLE Coordination: decreased gross motor;decreased fine motor (bilat)    Cervical / Trunk Assessment Cervical / Trunk Assessment: Kyphotic  Communication   Communication: No difficulties  Cognition Arousal/Alertness: Awake/alert Behavior During Therapy: Flat affect Overall Cognitive Status: History of cognitive impairments - at baseline                                 General Comments: history of dementia, pt with flat affect but responds to questions appropriately        General Comments      Exercises     Assessment/Plan    PT Assessment Patient needs continued PT services  PT Problem List Decreased strength;Decreased mobility;Decreased safety awareness;Decreased activity tolerance;Decreased balance;Decreased knowledge of use of DME;Decreased cognition;Decreased coordination       PT Treatment Interventions DME instruction;Therapeutic activities;Gait training;Therapeutic exercise;Patient/family education;Balance training;Functional mobility training;Neuromuscular re-education    PT Goals (Current goals can be found in the Care Plan section)  Acute Rehab PT Goals PT Goal Formulation: With patient Time For Goal Achievement: 05/30/22 Potential to Achieve Goals: Good    Frequency Min 2X/week     Co-evaluation               AM-PAC PT "6 Clicks" Mobility  Outcome Measure Help needed turning from your back to your side while in a flat bed without using bedrails?: A Lot Help needed moving from lying on your back to sitting on the side of a flat bed without using bedrails?: A Lot Help needed moving to and from a bed to a chair  (including a wheelchair)?: A Lot Help needed standing up from a chair using your arms (e.g., wheelchair or bedside chair)?: A Lot Help needed to walk in hospital room?: Total Help needed climbing 3-5 steps with a railing? : Total 6 Click Score: 10    End of Session   Activity Tolerance: Patient limited by fatigue Patient left: in bed;with call bell/phone within reach;with bed alarm set;with family/visitor present Nurse Communication: Mobility status PT Visit Diagnosis: Other abnormalities of gait and mobility (R26.89);Muscle weakness (generalized) (M62.81)    Time: 7106-2694 PT Time Calculation (min) (ACUTE ONLY): 17 min   Charges:   PT Evaluation $PT Eval Low Complexity: 1 Low         Eric Barrett S, PT DPT Acute Rehabilitation Services Pager [phone]  Office [phone]   Roxine Caddy E Ruffin Pyo 05/16/2022, 5:26 PM

## 2022-05-16 NOTE — Progress Notes (Signed)
Patient alert and oriented. Patient with visible tremors with movement. Patient was recently moved from [facility] to bed with assistance of 2 staff members. Vital signs taken shortly after. Will reassess.  05/15/22 2225  Vitals  BP 115/69  MAP (mmHg) 83  BP Method Automatic  Pulse Rate (!) 111  Pulse Rate Source Monitor  MEWS COLOR  MEWS Score Color Yellow  Oxygen Therapy  SpO2 95 %  MEWS Score  MEWS Temp 0  MEWS Systolic 0  MEWS Pulse 2  MEWS RR 0  MEWS LOC 0  MEWS Score 2

## 2022-05-16 NOTE — Progress Notes (Signed)
Spoke to Dr. Starla Link regarding potassium level 6.6 New orders per Dr., medications given as ordered.  Spoke to pharmacy x 2. Sending calcium glutamate shortly.

## 2022-05-16 NOTE — Consult Note (Addendum)
Reason for Consult: AKI/CKD stage IIIa Referring Physician: Starla Link, MD  Eric Barrett. is an 81 y.o. male with a PMH significant for HTN, CVA, left hemiparesis, HLD, dementia, chronic diastolic CHF, and CKD stage IIIa who was seen in Neurology clinic on 05/11/22 due to worsening memory.  Labs were ordered out of concern for recurrent UTI.  Labs were notable for Na of 122 and wife was instructed to go to the ED on 05/14/22 but decided to wait until the next day.  In the ED, he was afebrile and VSS.  Repeat labs notable for Na 123, K 5.3, Cr 1.34, gluc 127, UA + for nitrite, blood, protein, and leukocytes.  He was started on ceftriaxone and admitted for further evaluation.  We were consulted due to the development of worsening hyperkalemia and AKI.  The trend in Scr is seen below.  He was taking losartan prior to admission and has had several episodes of syncope in the past 6 months.    His wife reports, that he was having worsening confusion, memory loss, and dysuria.  She denies any N/V/D and was started on metamucil for constipation.  He increased his water intake to 8 16 oz bottles a day.  Renal US without evidence of obstruction but did have urinary retention of >300 cc's.   He was also noted to be hypotensive yesterday and today with BP 90/62 and 94/52, respectively.  Currently he denies any SOB, chest pain, lower extremity edema but continues to have dysuria and some urinary retention.  Trend in Creatinine: Creatinine, Ser  Date/Time Value Ref Range Status  05/16/2022 12:11 PM 2.45 (H) 0.61 - 1.24 mg/dL Final  05/16/2022 07:38 AM 2.36 (H) 0.61 - 1.24 mg/dL Final  05/16/2022 02:53 AM 2.14 (H) 0.61 - 1.24 mg/dL Final  05/15/2022 10:55 AM 1.34 (H) 0.61 - 1.24 mg/dL Final  05/11/2022 09:13 AM 1.20 0.76 - 1.27 mg/dL Final  01/06/2022 03:15 PM 1.73 (H) 0.61 - 1.24 mg/dL Final  08/25/2021 11:46 PM 1.80 (H) 0.61 - 1.24 mg/dL Final  01/18/2021 02:51 PM 1.21 0.61 - 1.24 mg/dL Final  12/24/2019 10:52  AM 1.36 (H) 0.76 - 1.27 mg/dL Final  11/18/2019 02:47 PM 1.27 0.76 - 1.27 mg/dL Final    PMH:   Past Medical History:  Diagnosis Date   CHF (congestive heart failure) (HCC)    Glaucoma    Hemiparesis of left nondominant side (HCC)    History of shingles    HLD (hyperlipidemia)    Hypertension    Stroke ([place])    Tremor     PSH:   Past Surgical History:  Procedure Laterality Date   No past surgery      Allergies: No Known Allergies  Medications:   Prior to Admission medications   Medication Sig Start Date End Date Taking? Authorizing Provider  amLODipine (NORVASC) 10 MG tablet Take 10 mg by mouth daily. 07/08/21  Yes [provider]  ASPIRIN 81 PO Take 81 mg by mouth daily.   Yes [provider]  Cholecalciferol (VITAMIN D3) 25 MCG (1000 UT) CAPS Take 1,000 Units by mouth daily.   Yes [provider]  divalproex (DEPAKOTE ER) 500 MG 24 hr tablet Take 1 tablet (500 mg total) by mouth at bedtime. 01/19/22  Yes [name], MD  docusate sodium (COLACE) 100 MG capsule Take 100 mg by mouth daily.   Yes [provider]  finasteride (PROSCAR) 5 MG tablet Take 5 mg by mouth daily. 08/24/21  Yes  [provider]  GAVILAX 17 GM/SCOOP powder Take 17 g by mouth daily. 04/26/22  Yes [provider]  losartan (COZAAR) 100 MG tablet Take 100 mg by mouth daily. 10/29/20  Yes [provider]  memantine (NAMENDA) 10 MG tablet Take 10 mg by mouth 2 (two) times daily. 02/08/22  Yes [provider]  MOVANTIK 25 MG TABS tablet Take 25 mg by mouth daily. 05/04/22  Yes [provider]  multivitamin-lutein (OCUVITE-LUTEIN) CAPS capsule Take 1 capsule by mouth daily.   Yes [provider]  MYRBETRIQ 25 MG TB24 tablet Take 25 mg by mouth daily. 11/02/21  Yes [provider]  rosuvastatin (CRESTOR) 20 MG tablet Take 20 mg by mouth daily.   Yes [provider]  tamsulosin (FLOMAX) 0.4 MG CAPS capsule Take 0.4  mg by mouth daily. 10/29/20  Yes [provider]  Travoprost, BAK Free, (TRAVATAN) 0.004 % SOLN ophthalmic solution Place 1 drop into both eyes at bedtime.   Yes [provider]    Inpatient medications:  amLODipine  10 mg Oral Daily   aspirin  81 mg Oral Daily   divalproex  500 mg Oral QHS   docusate sodium  100 mg Oral Daily   finasteride  5 mg Oral Daily   heparin  5,000 Units Subcutaneous Q12H   latanoprost  1 drop Both Eyes QHS   memantine  10 mg Oral BID   mirabegron ER  25 mg Oral Daily   polyethylene glycol  17 g Oral Daily   rosuvastatin  20 mg Oral Daily   tamsulosin  0.4 mg Oral Daily    Discontinued Meds:   Medications Discontinued During This Encounter  Medication Reason   calcium gluconate inj 10% (1 g) URGENT USE ONLY! Duplicate    Social History:  reports that he has quit smoking. He has never used smokeless tobacco. He reports that he does not currently use alcohol. He reports that he does not use drugs.  Family History:   Family History  Problem Relation Age of Onset   Heart attack Mother    Heart attack Father     Pertinent items are noted in HPI. Weight change:   Intake/Output Summary (Last 24 hours) at 05/16/2022 1439 Last data filed at 05/16/2022 1414 Gross per 24 hour  Intake 236 ml  Output --  Net 236 ml   BP (!) 94/52 (BP Location: Left Arm)   Pulse 87   Temp 98.4 F (36.9 C) (Oral)   Resp 17   Ht '5\' 8"'$  (1.727 m)   Wt 82.2 kg   SpO2 96%   BMI 27.55 kg/m  Vitals:   05/16/22 0018 05/16/22 0520 05/16/22 0818 05/16/22 1235  BP: 90/62 103/61 104/62 (!) 94/52  Pulse: 98 91 90 87  Resp: '18 17 18 17  '$ Temp: 97.9 F (36.6 C) (!) 97.5 F (36.4 C) 98.5 F (36.9 C) 98.4 F (36.9 C)  TempSrc: Oral  Oral Oral  SpO2: 95% 96% 97% 96%  Weight:      Height:         General appearance: cooperative, no distress, and slowed mentation Head: Normocephalic, without obvious abnormality, atraumatic Resp: clear to auscultation  bilaterally Cardio: regular rate and rhythm, S1, S2 normal, no murmur, click, rub or gallop GI: distended, +BS, soft, NT, some suprapubic fullness Extremities: extremities normal, atraumatic, no cyanosis or edema and left hemiparesis  Labs: Basic Metabolic Panel: Recent Labs  Lab 05/11/22 0913 05/15/22 1055 05/15/22 2050 05/16/22  1610 05/16/22 0738 05/16/22 1211  NA 122* 123* 122* 123* 122* 123*  K 5.5* 5.3*  --  5.3* 6.6* 5.0  CL 82* 90*  --  88* 89* 91*  CO2 20 26  --  '23 22 22  '$ GLUCOSE 156* 127*  --  141* 136* 157*  BUN 12 11  --  '13 17 21  '$ CREATININE 1.20 1.34*  --  2.14* 2.36* 2.45*  ALBUMIN 4.2 3.5  --   --   --   --   CALCIUM 9.4 9.1  --  9.3 9.2 8.7*   Liver Function Tests: Recent Labs  Lab 05/11/22 0913 05/15/22 1055  AST 38 30  ALT 21 19  ALKPHOS 53 41  BILITOT 0.4 0.5  PROT 7.2 7.0  ALBUMIN 4.2 3.5   No results for input(s): "LIPASE", "AMYLASE" in the last 168 hours. No results for input(s): "AMMONIA" in the last 168 hours. CBC: Recent Labs  Lab 05/11/22 0913 05/15/22 1055 05/16/22 0253  WBC 7.6 5.5 35.5*  NEUTROABS 6.2 3.7  --   HGB 14.3 13.6 13.4  HCT 41.6 39.3 37.7*  MCV 80 80.2 78.5*  PLT 231 223 205   PT/INR: '@LABRCNTIP'$ (inr:5) Cardiac Enzymes: )No results for input(s): "CKTOTAL", "CKMB", "CKMBINDEX", "TROPONINI" in the last 168 hours. CBG: No results for input(s): "GLUCAP" in the last 168 hours.  Iron Studies: No results for input(s): "IRON", "TIBC", "TRANSFERRIN", "FERRITIN" in the last 168 hours.  Xrays/Other Studies: US RENAL  Result Date: 05/15/2022 CLINICAL DATA:  Acute kidney injury. EXAM: RENAL / URINARY TRACT ULTRASOUND COMPLETE COMPARISON:  CT abdomen dated 08/26/2021. FINDINGS: Right Kidney: Renal measurements: 9.9 x 5 x 5.1 cm = volume: 131 mL. Cortical thickness and echogenicity is within normal limits. 2 renal cysts are identified, measuring 1.6 cm and 1.1 cm respectively. No follow-up imaging is recommended for these benign  findings. No suspicious mass or hydronephrosis. Left Kidney: Renal measurements: 9.2 x 4.7 x 4.2 cm = volume: 96 mL. Cortical thickness and echogenicity within normal limits. No mass or hydronephrosis. Bladder: Appears normal for degree of bladder distention. Patient voided just prior to exam. Postvoid volume measured at 342 cc. Other: None. IMPRESSION: 1. Kidneys are normal in size and appearance. No hydronephrosis. 2. Bladder is unremarkable. Patient voided just prior to exam. Postvoid volume measured at 342 cc. Electronically Signed   By: Franki Cabot M.D.   On: 05/15/2022 14:51     Assessment/Plan:  Hyponatremia - unclear etiology.  Nothing to support volume depletion.  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  He has also had poor solute intake and high free water intake so "tea and toast" picture could also be contributing.  Currently asymptomatic so continue with IV fluids and follow sodium levels. Hyperkalemia - improved with Lokelma, insulin/D5, and bicarb.  Discontinue Losartan and would not restart given hypotension and risks outweigh benefits.  Continue to follow. AKI/CKD stage IIIa - Scr was normal at presentation to ED but quickly doubled.  He does have some urinary retention but no hydronephrosis on [place].  Possible ATN due to recurrent episodes of hypotension.  Continue to hold ARB.  Continue with IVF's and follow. UTI - awaiting culture and sensitivity.  He did have urine cx on 05/11/22 at [facility] and may be helpful to obtain results.  Cultures pending.  Currently on Ceftriaxone. Urinary retention - follow bladder scans and may require intermittent I&O cath. Continue with tamsulosin and myrbetriq for now. Dementia - stable for now H/o CVA Chronic diastolic CHF - no evidence of volume overload. 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  No recent dose change.    Eric Barrett 05/16/2022, 2:39 PM

## 2022-05-17 DIAGNOSIS — E871 Hypo-osmolality and hyponatremia: Secondary | ICD-10-CM | POA: Diagnosis not present

## 2022-05-17 DIAGNOSIS — N179 Acute kidney failure, unspecified: Secondary | ICD-10-CM | POA: Diagnosis not present

## 2022-05-17 DIAGNOSIS — Z66 Do not resuscitate: Secondary | ICD-10-CM | POA: Diagnosis not present

## 2022-05-17 DIAGNOSIS — N3001 Acute cystitis with hematuria: Secondary | ICD-10-CM | POA: Diagnosis not present

## 2022-05-17 DIAGNOSIS — N3000 Acute cystitis without hematuria: Secondary | ICD-10-CM

## 2022-05-17 DIAGNOSIS — Z7189 Other specified counseling: Secondary | ICD-10-CM | POA: Diagnosis not present

## 2022-05-17 LAB — COMPREHENSIVE METABOLIC PANEL
ALT: 18 U/L (ref 0–44)
AST: 26 U/L (ref 15–41)
Albumin: 2.6 g/dL — ABNORMAL LOW (ref 3.5–5.0)
Alkaline Phosphatase: 39 U/L (ref 38–126)
Anion gap: 7 (ref 5–15)
BUN: 30 mg/dL — ABNORMAL HIGH (ref 8–23)
CO2: 24 mmol/L (ref 22–32)
Calcium: 8.5 mg/dL — ABNORMAL LOW (ref 8.9–10.3)
Chloride: 94 mmol/L — ABNORMAL LOW (ref 98–111)
Creatinine, Ser: 2.05 mg/dL — ABNORMAL HIGH (ref 0.61–1.24)
GFR, Estimated: 32 mL/min — ABNORMAL LOW (ref >60)
Glucose, Bld: 132 mg/dL — ABNORMAL HIGH (ref 70–99)
Potassium: 4.7 mmol/L (ref 3.5–5.1)
Sodium: 125 mmol/L — ABNORMAL LOW (ref 135–145)
Total Bilirubin: 0.5 mg/dL (ref 0.3–1.2)
Total Protein: 5.6 g/dL — ABNORMAL LOW (ref 6.5–8.1)

## 2022-05-17 LAB — AMMONIA: Ammonia: 16 umol/L (ref 9–35)

## 2022-05-17 LAB — CBC WITH DIFFERENTIAL/PLATELET
Abs Immature Granulocytes: 0.62 10*3/uL — ABNORMAL HIGH (ref 0.00–0.07)
Basophils Absolute: 0.1 10*3/uL (ref 0.0–0.1)
Basophils Relative: 0 %
Eosinophils Absolute: 0 10*3/uL (ref 0.0–0.5)
Eosinophils Relative: 0 %
HCT: 32.6 % — ABNORMAL LOW (ref 39.0–52.0)
Hemoglobin: 11.5 g/dL — ABNORMAL LOW (ref 13.0–17.0)
Immature Granulocytes: 2 %
Lymphocytes Relative: 3 %
Lymphs Abs: 1 10*3/uL (ref 0.7–4.0)
MCH: 28 pg (ref 26.0–34.0)
MCHC: 35.3 g/dL (ref 30.0–36.0)
MCV: 79.3 fL — ABNORMAL LOW (ref 80.0–100.0)
Monocytes Absolute: 1.9 10*3/uL — ABNORMAL HIGH (ref 0.1–1.0)
Monocytes Relative: 6 %
Neutro Abs: 29.5 10*3/uL — ABNORMAL HIGH (ref 1.7–7.7)
Neutrophils Relative %: 89 %
Platelets: 183 10*3/uL (ref 150–400)
RBC: 4.11 MIL/uL — ABNORMAL LOW (ref 4.22–5.81)
RDW: 14.1 % (ref 11.5–15.5)
WBC: 33.1 10*3/uL — ABNORMAL HIGH (ref 4.0–10.5)
nRBC: 0 % (ref 0.0–0.2)

## 2022-05-17 LAB — TSH: TSH: 0.797 u[IU]/mL (ref 0.350–4.500)

## 2022-05-17 LAB — VITAMIN B12: Vitamin B-12: 652 pg/mL (ref 180–914)

## 2022-05-17 NOTE — Progress Notes (Signed)
RE:  Eric Barrett. [name] Date of Birth:  September 14, 1940 Date:  05/17/2022  To whom it may concern,  Please be advised that the above-named patient will require a short-term nursing home stay - anticipated 30 days or less for rehabilitation and strengthening.  The plan is for return home.

## 2022-05-17 NOTE — Progress Notes (Signed)
PROGRESS NOTE    Eric Barrett.  HDQ:[id number] DOB: 1941-07-03 DOA: 05/15/2022 PCP: [name], FNP   Brief Narrative:  81 y.o. male with medical history significant of Alzheimer's dementia, HTN, HLD, chronic HFpEF, remote stroke with residual left-sided weakness, glaucoma, seizure disorder presented with altered mental status and dysuria.  Apparently, patient had been drinking 6 to 8 -16 ounces bottles of water recently.  He saw his PCP 4 days ago, blood work showed sodium of 122, UA showed hematuria and pyuria, p.o. antibiotics was ordered however patient had not started antibiotics yet.  Patient presented to the ED more confused and sodium was 123, UA showing hematuria and pyuria again.  He was started on Rocephin and admitted for further management.    Assessment & Plan:   Acute on chronic hyponatremia Likely secondary to poor solute intake Vs SIADH Nephrology on board, appreciate recs Continue IV fluids for now Continue frequent BMP Monitor closely  Acute metabolic encephalopathy-improving History of Alzheimer's dementia Possibly from UTI and hyponatremia PT/OT eval Continue memantine Delirium precaution  Staph epidermidis UTI Currently afebrile, with marked leukocytosis UA positive for UTI UC growing Staph epidermidis >100,000 BC x2 NGTD Continue ceftriaxone, pending susceptibility  AKI on CKD stage IIIa Creatinine 1.34 on admission, trended up and peaked to 2.4 Possible ATN given frequent hypotensive episodes VS UTI Neurology on board Renal ultrasound was negative for hydronephrosis Continue to hold ARB, amlodipine Continue IV fluids BMP  Hyperkalemia-improving Potassium trended up to 6.6, now stable Repeat BMP and monitor closely  Hypertension BP soft Hold amlodipine, losartan  Chronic diastolic CHF Currently compensated Losartan on hold because of AKI  BPH Continue Proscar and Flomax Outpatient follow-up with urology  Seizure  disorder Continue Depakote Outpatient follow-up with neurology  Goals of care Palliative care consultation for goals of care discussion Full scope of care for now     DVT prophylaxis: Heparin subcutaneous Code Status: Full Family Communication: None at bedside Disposition Plan: Status is: Inpatient Remains inpatient appropriate because: Of severity of illness   Consultants: Nephrology/palliative care  Procedures: None  Antimicrobials: Rocephin from 05/15/2022 onwards    Subjective: Patient seen and examined at bedside.  Complains of some headache, denies any vision loss, nausea/vomiting, worsening weakness, abdominal pain, fever/chills.    Objective: Vitals:   05/16/22 1730 05/16/22 2043 05/17/22 0422 05/17/22 0753  BP: (!) 100/58 107/63 111/70 109/63  Pulse: 98 (!) 102 76 77  Resp: '17 16 17 18  '$ Temp: 98.4 F (36.9 C) 98.9 F (37.2 C) 98.6 F (37 C) 98 F (36.7 C)  TempSrc: Oral Oral  Oral  SpO2: 98% 96% 98% 96%  Weight:      Height:        Intake/Output Summary (Last 24 hours) at 05/17/2022 1627 Last data filed at 05/17/2022 1300 Gross per 24 hour  Intake 1200 ml  Output 2700 ml  Net -1500 ml   Filed Weights   05/15/22 1820  Weight: 82.2 kg    Examination: General: NAD, chronically ill-appearing Cardiovascular: S1, S2 present Respiratory: CTAB Abdomen: Soft, nontender, nondistended, bowel sounds present Musculoskeletal: No bilateral pedal edema noted Skin: Normal Psychiatry: Normal mood     Data Reviewed: I have personally reviewed following labs and imaging studies  CBC: Recent Labs  Lab 05/11/22 0913 05/15/22 1055 05/16/22 0253 05/17/22 0322  WBC 7.6 5.5 35.5* 33.1*  NEUTROABS 6.2 3.7  --  29.5*  HGB 14.3 13.6 13.4 11.5*  HCT 41.6 39.3 37.7* 32.6*  MCV 80 80.2 78.5* 79.3*  PLT 231 223 205 161   Basic Metabolic Panel: Recent Labs  Lab 05/16/22 0738 05/16/22 1211 05/16/22 1557 05/16/22 1950 05/17/22 0322  NA 122* 123*  123* 124* 125*  K 6.6* 5.0 4.8 4.2 4.7  CL 89* 91* 91* 92* 94*  CO2 '22 22 22 22 24  '$ GLUCOSE 136* 157* 151* 226* 132*  BUN 17 21 27* 32* 30*  CREATININE 2.36* 2.45* 2.48* 2.42* 2.05*  CALCIUM 9.2 8.7* 8.6* 8.8* 8.5*   GFR: Estimated Creatinine Clearance: 29.5 mL/min (A) (by C-G formula based on SCr of 2.05 mg/dL (H)). Liver Function Tests: Recent Labs  Lab 05/11/22 0913 05/15/22 1055 05/17/22 0322  AST 38 30 26  ALT '21 19 18  '$ ALKPHOS 53 41 39  BILITOT 0.4 0.5 0.5  PROT 7.2 7.0 5.6*  ALBUMIN 4.2 3.5 2.6*   No results for input(s): "LIPASE", "AMYLASE" in the last 168 hours. Recent Labs  Lab 05/17/22 0322  AMMONIA 16   Coagulation Profile: No results for input(s): "INR", "PROTIME" in the last 168 hours. Cardiac Enzymes: No results for input(s): "CKTOTAL", "CKMB", "CKMBINDEX", "TROPONINI" in the last 168 hours. BNP (last 3 results) No results for input(s): "PROBNP" in the last 8760 hours. HbA1C: No results for input(s): "HGBA1C" in the last 72 hours. CBG: Recent Labs  Lab 05/16/22 1726  GLUCAP 160*   Lipid Profile: No results for input(s): "CHOL", "HDL", "LDLCALC", "TRIG", "CHOLHDL", "LDLDIRECT" in the last 72 hours. Thyroid Function Tests: Recent Labs    05/17/22 0322  TSH 0.797   Anemia Panel: Recent Labs    05/17/22 0322  VITAMINB12 652   Sepsis Labs: No results for input(s): "PROCALCITON", "LATICACIDVEN" in the last 168 hours.  Recent Results (from the past 240 hour(s))  Urine Culture     Status: Abnormal (Preliminary result)   Collection Time: 05/16/22  7:22 AM   Specimen: Urine, Clean Catch  Result Value Ref Range Status   Specimen Description URINE, CLEAN CATCH  Final   Special Requests NONE  Final   Culture (A)  Final    >=100,000 COLONIES/mL STAPHYLOCOCCUS EPIDERMIDIS SUSCEPTIBILITIES TO FOLLOW Performed at [facility], [address] [place], [place] [address]    Report Status PENDING  Incomplete  Culture, blood (Routine X 2) w  Reflex to ID Panel     Status: None (Preliminary result)   Collection Time: 05/16/22  7:38 AM   Specimen: BLOOD  Result Value Ref Range Status   Specimen Description BLOOD RIGHT ANTECUBITAL  Final   Special Requests   Final    BOTTLES DRAWN AEROBIC AND ANAEROBIC Blood Culture adequate volume   Culture   Final    NO GROWTH 1 DAY Performed at [facility], [place] [address] [place], [place] [address]    Report Status PENDING  Incomplete  Culture, blood (Routine X 2) w Reflex to ID Panel     Status: None (Preliminary result)   Collection Time: 05/16/22  7:41 AM   Specimen: BLOOD LEFT ARM  Result Value Ref Range Status   Specimen Description BLOOD LEFT ARM  Final   Special Requests   Final    BOTTLES DRAWN AEROBIC AND ANAEROBIC Blood Culture adequate volume   Culture   Final    NO GROWTH 1 DAY Performed at [facility], [place] [address], [place], [place] [address]    Report Status PENDING  Incomplete         Radiology Studies:  No results found.      Scheduled Meds:  aspirin  81 mg Oral Daily   divalproex  500 mg Oral QHS   docusate sodium  100 mg Oral Daily   finasteride  5 mg Oral Daily   heparin  5,000 Units Subcutaneous Q12H   latanoprost  1 drop Both Eyes QHS   linaclotide  145 mcg Oral QAC breakfast   memantine  10 mg Oral BID   mirabegron ER  25 mg Oral Daily   polyethylene glycol  17 g Oral Daily   rosuvastatin  20 mg Oral Daily   sodium zirconium cyclosilicate  10 g Oral Daily   tamsulosin  0.4 mg Oral Daily   Continuous Infusions:  sodium chloride 125 mL/hr at 05/17/22 1602   cefTRIAXone (ROCEPHIN)  IV 1 g (05/17/22 1248)          Alma Friendly, MD [facility] 05/17/2022, 4:27 PM

## 2022-05-17 NOTE — Progress Notes (Signed)
Patient ID: Eric Barrett., male   DOB: [birth year], 81 y.o.   MRN: [id number] S: Complaining of a headache this morning.  "Same spot as my stroke". O:BP 109/63 (BP Location: Right Arm)   Pulse 77   Temp 98 F (36.7 C) (Oral)   Resp 18   Ht '5\' 8"'$  (1.727 m)   Wt 82.2 kg   SpO2 96%   BMI 27.55 kg/m   Intake/Output Summary (Last 24 hours) at 05/17/2022 1052 Last data filed at 05/17/2022 0616 Gross per 24 hour  Intake 1318 ml  Output 1600 ml  Net -282 ml   Intake/Output: I/O last 3 completed shifts: In: 8889 [P.O.:436; I.V.:1000] Out: 1600 [Urine:1000; Other:600]  Intake/Output this shift:  No intake/output data recorded. Weight change:  Gen: NAD CVS: RRR Resp:CTA Abd: +BS, soft, mildly distended, non-tender Ext: no edema  Recent Labs  Lab 05/11/22 0913 05/11/22 0913 05/15/22 1055 05/15/22 2050 05/16/22 0253 05/16/22 0738 05/16/22 1211 05/16/22 1557 05/16/22 1950 05/17/22 0322  NA 122*   < > 123* 122* 123* 122* 123* 123* 124* 125*  K 5.5*  --  5.3*  --  5.3* 6.6* 5.0 4.8 4.2 4.7  CL 82*  --  90*  --  88* 89* 91* 91* 92* 94*  CO2 20  --  26  --  '23 22 22 22 22 24  '$ GLUCOSE 156*  --  127*  --  141* 136* 157* 151* 226* 132*  BUN 12  --  11  --  '13 17 21 '$ 27* 32* 30*  CREATININE 1.20  --  1.34*  --  2.14* 2.36* 2.45* 2.48* 2.42* 2.05*  ALBUMIN 4.2  --  3.5  --   --   --   --   --   --  2.6*  CALCIUM 9.4  --  9.1  --  9.3 9.2 8.7* 8.6* 8.8* 8.5*  AST 38  --  30  --   --   --   --   --   --  26  ALT 21  --  19  --   --   --   --   --   --  18   < > = values in this interval not displayed.   Liver Function Tests: Recent Labs  Lab 05/11/22 0913 05/15/22 1055 05/17/22 0322  AST 38 30 26  ALT '21 19 18  '$ ALKPHOS 53 41 39  BILITOT 0.4 0.5 0.5  PROT 7.2 7.0 5.6*  ALBUMIN 4.2 3.5 2.6*   No results for input(s): "LIPASE", "AMYLASE" in the last 168 hours. Recent Labs  Lab 05/17/22 0322  AMMONIA 16   CBC: Recent Labs  Lab 05/11/22 0913 05/15/22 1055  05/16/22 0253 05/17/22 0322  WBC 7.6 5.5 35.5* 33.1*  NEUTROABS 6.2 3.7  --  29.5*  HGB 14.3 13.6 13.4 11.5*  HCT 41.6 39.3 37.7* 32.6*  MCV 80 80.2 78.5* 79.3*  PLT 231 223 205 183   Cardiac Enzymes: No results for input(s): "CKTOTAL", "CKMB", "CKMBINDEX", "TROPONINI" in the last 168 hours. CBG: Recent Labs  Lab 05/16/22 1726  GLUCAP 160*    Iron Studies: No results for input(s): "IRON", "TIBC", "TRANSFERRIN", "FERRITIN" in the last 72 hours. Studies/Results: US RENAL  Result Date: 05/15/2022 CLINICAL DATA:  Acute kidney injury. EXAM: RENAL / URINARY TRACT ULTRASOUND COMPLETE COMPARISON:  CT abdomen dated 08/26/2021. FINDINGS: Right Kidney: Renal measurements: 9.9 x 5 x 5.1 cm = volume: 131 mL. Cortical thickness and echogenicity is  within normal limits. 2 renal cysts are identified, measuring 1.6 cm and 1.1 cm respectively. No follow-up imaging is recommended for these benign findings. No suspicious mass or hydronephrosis. Left Kidney: Renal measurements: 9.2 x 4.7 x 4.2 cm = volume: 96 mL. Cortical thickness and echogenicity within normal limits. No mass or hydronephrosis. Bladder: Appears normal for degree of bladder distention. Patient voided just prior to exam. Postvoid volume measured at 342 cc. Other: None. IMPRESSION: 1. Kidneys are normal in size and appearance. No hydronephrosis. 2. Bladder is unremarkable. Patient voided just prior to exam. Postvoid volume measured at 342 cc. Electronically Signed   By: Franki Cabot M.D.   On: 05/15/2022 14:51    aspirin  81 mg Oral Daily   divalproex  500 mg Oral QHS   docusate sodium  100 mg Oral Daily   finasteride  5 mg Oral Daily   heparin  5,000 Units Subcutaneous Q12H   latanoprost  1 drop Both Eyes QHS   linaclotide  145 mcg Oral QAC breakfast   memantine  10 mg Oral BID   mirabegron ER  25 mg Oral Daily   polyethylene glycol  17 g Oral Daily   rosuvastatin  20 mg Oral Daily   sodium zirconium cyclosilicate  10 g Oral Daily    tamsulosin  0.4 mg Oral Daily    BMET    Component Value Date/Time   NA 125 (L) 05/17/2022 0322   NA 122 (L) 05/11/2022 0913   K 4.7 05/17/2022 0322   CL 94 (L) 05/17/2022 0322   CO2 24 05/17/2022 0322   GLUCOSE 132 (H) 05/17/2022 0322   BUN 30 (H) 05/17/2022 0322   BUN 12 05/11/2022 0913   CREATININE 2.05 (H) 05/17/2022 0322   CALCIUM 8.5 (L) 05/17/2022 0322   GFRNONAA 32 (L) 05/17/2022 0322   GFRAA 57 (L) 12/24/2019 1052   CBC    Component Value Date/Time   WBC 33.1 (H) 05/17/2022 0322   RBC 4.11 (L) 05/17/2022 0322   HGB 11.5 (L) 05/17/2022 0322   HGB 14.3 05/11/2022 0913   HCT 32.6 (L) 05/17/2022 0322   HCT 41.6 05/11/2022 0913   PLT 183 05/17/2022 0322   PLT 231 05/11/2022 0913   MCV 79.3 (L) 05/17/2022 0322   MCV 80 05/11/2022 0913   MCH 28.0 05/17/2022 0322   MCHC 35.3 05/17/2022 0322   RDW 14.1 05/17/2022 0322   RDW 14.2 05/11/2022 0913   LYMPHSABS 1.0 05/17/2022 0322   LYMPHSABS 0.7 05/11/2022 0913   MONOABS 1.9 (H) 05/17/2022 0322   EOSABS 0.0 05/17/2022 0322   EOSABS 0.1 05/11/2022 0913   BASOSABS 0.1 05/17/2022 0322   BASOSABS 0.0 05/11/2022 0913     Assessment/Plan:  Hyponatremia - unclear etiology.  Nothing to support volume depletion.  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  SIADH less likely as sodium has been improving with IVF"s.  He likely has had poor solute intake and high free water intake so "tea and toast" picture could also be contributing.  Currently asymptomatic so continue with IV fluids and follow sodium levels. Hyperkalemia - improved with Lokelma, insulin/D5, and bicarb.  Discontinue Losartan and would not restart given hypotension and risks outweigh benefits.  Continue to follow. AKI/CKD stage IIIa - Scr was normal at presentation to ED but quickly doubled.  He  does have some  urinary retention but no hydronephrosis on [place].  Possible ATN due to recurrent episodes of hypotension.  Continue to hold ARB.  Scr improving and good UOP.  Continue with IVF's and follow.  No indication for dialysis at this time.  UTI - awaiting culture and sensitivity.  He did have urine cx on 05/11/[facility] at [facility] and may be helpful to obtain results.  Cultures pending.  Currently on Ceftriaxone. Urinary retention - follow bladder scans and may require intermittent I&O cath. Continue with tamsulosin and myrbetriq for now. Dementia - stable for now H/o CVA Chronic diastolic CHF - no evidence of volume overload. 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  No recent dose change.   Donetta Potts, MD [facility]

## 2022-05-17 NOTE — NC FL2 (Signed)
[place] LEVEL OF CARE SCREENING TOOL     IDENTIFICATION  Patient Name: Eric Barrett. Birthdate: 03-Oct-1940 Sex: male Admission Date (Current Location): 05/15/2022  [facility] and [place] Number:  Herbalist and Address:  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[place]) 08/02/2021   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 ([place]) 08/02/2021   Memory disturbance 01/27/2021   History of stroke 11/27/2019   Gait abnormality 11/27/2019    Orientation RESPIRATION BLADDER Height & Weight     Self, Time, Situation, Place  Normal External catheter Weight: 82.2 kg Height:  '5\' 8"'$  (172.7 cm)  BEHAVIORAL SYMPTOMS/MOOD NEUROLOGICAL BOWEL NUTRITION STATUS    Convulsions/Seizures (HIstory of last seizure - 05/11/2022 - takes Depakote) Continent Diet  AMBULATORY STATUS COMMUNICATION OF NEEDS Skin   Total Care Verbally Normal                       Personal Care Assistance Level of Assistance  Bathing, Feeding, Dressing Bathing Assistance: Maximum assistance Feeding assistance: Limited assistance Dressing Assistance: Maximum assistance     Functional Limitations Info  Sight, Hearing, Speech Sight Info: Adequate Hearing Info: Adequate Speech Info: Adequate    SPECIAL CARE FACTORS FREQUENCY  PT (By licensed PT), OT (By licensed OT)     PT Frequency: 3-5 x per  week OT Frequency: 3-5 x per week            Contractures Contractures Info: Not present    Additional Factors Info  Code Status, Allergies, Psychotropic Code Status Info: Full code Allergies Info: NKDA Psychotropic Info: Namenda, Depakote         Current Medications (05/17/2022):  This is the current hospital active medication list Current Facility-Administered Medications  Medication Dose Route Frequency Provider Last Rate Last Admin   0.9 %  sodium chloride infusion   Intravenous Continuous Aline August, MD 125 mL/hr at 05/17/22 0616 New Bag at 05/17/22 0616   aspirin chewable tablet 81 mg  81 mg Oral Daily Wynetta Fines T, MD   81 mg at 05/17/22 1014   cefTRIAXone (ROCEPHIN) 1 g in sodium chloride 0.9 % 100 mL IVPB  1 g Intravenous Q24H Wynetta Fines T, MD 200 mL/hr at 05/17/22 1248 1 g at 05/17/22 1248   divalproex (DEPAKOTE ER) 24 hr tablet 500 mg  500 mg Oral QHS Wynetta Fines T, MD   500 mg at 05/16/22 2201   docusate sodium (COLACE) capsule 100 mg  100 mg Oral Daily Wynetta Fines T, MD   100 mg at 05/17/22 1014   finasteride (PROSCAR) tablet 5 mg  5 mg Oral Daily Wynetta Fines T, MD   5 mg at 05/17/22 1014   heparin injection 5,000 Units  5,000 Units Subcutaneous Q12H Wynetta Fines T, MD   5,000 Units at 05/17/22 1014   hydrALAZINE (APRESOLINE) tablet 25 mg  25  mg Oral Q6H PRN Wynetta Fines T, MD       latanoprost (XALATAN) 0.005 % ophthalmic solution 1 drop  1 drop Both Eyes QHS Wynetta Fines T, MD   1 drop at 05/16/22 2201   linaclotide (LINZESS) capsule 145 mcg  145 mcg Oral QAC breakfast Walden Field A, NP   145 mcg at 05/17/22 0639   memantine (NAMENDA) tablet 10 mg  10 mg Oral BID Wynetta Fines T, MD   10 mg at 05/17/22 1014   mirabegron ER (MYRBETRIQ) tablet 25 mg  25 mg Oral Daily Wynetta Fines T, MD   25 mg at 05/17/22 1018   Oral care mouth rinse  15 mL Mouth Rinse PRN Wynetta Fines T, MD       polyethylene glycol (MIRALAX / GLYCOLAX) packet 17 g  17 g Oral Daily Wynetta Fines T, MD    17 g at 05/17/22 0759   rosuvastatin (CRESTOR) tablet 20 mg  20 mg Oral Daily Wynetta Fines T, MD   20 mg at 05/17/22 1014   sodium zirconium cyclosilicate (LOKELMA) packet 10 g  10 g Oral Daily Donato Heinz, MD   10 g at 05/17/22 1018   tamsulosin (FLOMAX) capsule 0.4 mg  0.4 mg Oral Daily Wynetta Fines T, MD   0.4 mg at 05/17/22 1014     Discharge Medications: Please see discharge summary for a list of discharge medications.  Relevant Imaging Results:  Relevant Lab Results:   Additional Information SS# [id number]  Curlene Labrum, RN

## 2022-05-17 NOTE — TOC Initial Note (Signed)
Transition of Care (TOC) - Initial/Assessment Note    Patient Details  Name: Eric Barrett. MRN: [id number] Date of Birth: 1941/01/22  [facility]) CM/SW Contact:    Curlene Labrum, RN Phone Number: 05/17/2022, 4:04 PM  Clinical Narrative:                 CM met with the patient and wife at the bedside to discuss transitions of care needs.  The patient has history of CVA and Alzheimer's disease and lives at home with the wife.  [name] wife states that she is agreeable to SNF placement for short term rehabilitation.  The patient is active with the [place] in [place], - I called April at the [facility] and left a message to obtain VA benefits information.  The patient lives in an apartment with the wife with family in the area.  The patient has RW but no other DME but will need a 3:1 for home when he is discharged home from the SNF.  [name] wife does not drive and transportation information will be included in the discharge instructions for wife's assistance in the future.  [name] wife states that she has not applied of [place] Medicaid and would appreciate assistance.  I sent an email to Shanon Rosser to assist with application since the wife does not have a computer at the home.  PASRR is pending at this time.  I requested the MD to sign the 30-day note and FL2 so I could update in PASRR system.  SNF work up started and patient faxed out in the hub.  Expected Discharge Plan: Skilled Nursing Facility Barriers to Discharge: Continued Medical Work up   Patient Goals and CMS Choice Patient states their goals for this hospitalization and ongoing recovery are:: to go to rehab before going home with wife CMS Medicare.gov Compare Post Acute Care list provided to:: Patient Represenative (must comment) (wife) Choice offered to / list presented to : Spouse  Expected Discharge Plan and Services Expected Discharge Plan: [place]   Discharge  Planning Services: CM Consult Post Acute Care Choice: [place] Living arrangements for the past 2 months: Apartment                                      Prior Living Arrangements/Services Living arrangements for the past 2 months: Apartment Lives with:: Spouse Patient language and need for interpreter reviewed:: Yes Do you feel safe going back to the place where you live?: Yes      Need for Family Participation in Patient Care: Yes (Comment) Care giver support system in place?: Yes (comment) Current home services: DME (RW at the house) Criminal Activity/Legal Involvement Pertinent to Current Situation/Hospitalization: No - Comment as needed  Activities of Daily Living Home Assistive Devices/Equipment: Environmental consultant (specify type) ADL Screening (condition at time of admission) Patient's cognitive ability adequate to safely complete daily activities?: No Is the patient deaf or have difficulty hearing?: Yes Does the patient have difficulty seeing, even when wearing glasses/contacts?: No Does the patient have difficulty concentrating, remembering, or making decisions?: Yes Patient able to express need for assistance with ADLs?: No Does the patient have difficulty dressing or bathing?: Yes Independently performs ADLs?: No Communication: Independent Dressing (OT): Needs assistance Is this a change from baseline?: Pre-admission baseline Grooming: Needs assistance Is this a change from baseline?: Pre-admission baseline Feeding: Independent with  device (comment) Bathing: Needs assistance Is this a change from baseline?: Pre-admission baseline Toileting: Needs assistance Is this a change from baseline?: Pre-admission baseline In/Out Bed: Needs assistance Is this a change from baseline?: Pre-admission baseline Walks in Home: Needs assistance Is this a change from baseline?: Pre-admission baseline Does the patient have difficulty walking or climbing stairs?:  Yes Weakness of Legs: Both Weakness of Arms/Hands: Both  Permission Sought/Granted Permission sought to share information with : Case Manager, PCP, Family Supports, Chartered certified accountant granted to share information with : Yes, Verbal Permission Granted     Permission granted to share info w AGENCY: SNF facilities  Permission granted to share info w Relationship: wife - Halen Antenucci     Emotional Assessment Appearance:: Appears stated age Attitude/Demeanor/Rapport: Gracious Affect (typically observed): Accepting Orientation: : Oriented to Self, Oriented to Place, Oriented to  Time, Oriented to Situation Alcohol / Substance Use: Not Applicable Psych Involvement: No (comment)  Admission diagnosis:  Hyponatremia [E87.1] Patient Active Problem List   Diagnosis Date Noted   Hyponatremia 05/15/2022   Alteration consciousness 01/19/2022   Dementia ([place]) 01/19/2022   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[place]) 08/02/2021   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 ([place]) 08/02/2021   Memory disturbance 01/27/2021   History of stroke 11/27/2019   Gait abnormality 11/27/2019   PCP:  [name], FNP Pharmacy:   [name] [place] [address] [place] [address] Phone: [phone] Fax: [phone]     Social Determinants of Health (SDOH) Interventions Housing Interventions: Intervention Not Indicated  Readmission Risk Interventions    05/17/2022    4:04 PM  Readmission Risk Prevention Plan  Transportation Screening Complete  PCP or Specialist Appt within 5-7 Days Complete  Home Care Screening Complete  Medication Review (RN CM) Complete

## 2022-05-17 NOTE — Progress Notes (Signed)
Chaplain responded to PMT consult for spiritual care. Chaplain visited with [name] and his wife at the patient's bedside. Chaplain asked open ended questions to facilitate story telling and emotional expression.   The couple shared [name]'s story of declining health and function over the last couple of months leading to his current state where he is primarily only able to get out of bed to use the restroom and has even fallen doing that. In addition, [name] is diagnosed with Dementia and experiencing an increase in difficulty with memory.   Chaplain acknowledged the grief that a loss of function brings as many individuals find it difficult to feel like themselves when they can no longer behave like themselves. [name] shared that he attempted to retire twice and finds joy in staying busy. He has enjoyed working out since he was a young teen and is no longer able to do that. Chaplain named the pressure that many men of Bandon's generation were raised to feel as providers for their families, often given very limited space to depend upon others and the struggle to let go of that identify and be the recipient of care.   Chaplain explored coping strategies for stress management and relaxation after the couple shared how this limited mobility has made [name] feel increasingly agitated, anxious, and depressed. We discussed radical acceptance and mindfulness and chaplain used motivational interviewing to facilitate in the reframing of [name]'s new normal. [name] is looking forward to beginning physical therapy to gain some independence and also metabolize some stress and welcomes chaplain follow up for practice of guided imagery.  Please page as further needs arise.  [name]. Elyn Peers, M.Div. [facility] Chaplain Pager [phone] Office [phone]

## 2022-05-17 NOTE — Progress Notes (Signed)
Daily Progress Note   Patient Name: Eric Barrett.       Date: 05/17/2022 DOB: Feb 03, 1941  Age: 81 y.o. MRN#: [id number] Attending Physician: Alma Friendly, MD Primary Care Physician: [name], FNP Admit Date: 05/15/2022 Length of Stay: 2 days  Reason for Consultation/Follow-up: Establishing goals of care  HPI/Patient Profile:  81 y.o. male  with past medical history of Alzheimer's dementia, HTN, HLD, chronic HFpEF, remote stroke with residual left-sided weakness, glaucoma, seizure disorder presented with altered mental status and dysuria. Has been drinking a lot of water due to ongoing constipation. He was admitted on 05/15/2022 with acute metabolic encephalopathy, acute on chronic hyponatremia, AKI on CKD 3B, hyperkalemia, UTI, leukocytosis, and others.   Subjective:   Subjective: Chart Reviewed. Updates received. Patient Assessed. Created space and opportunity for patient  and family to explore thoughts and feelings regarding current medical situation.  Today's Discussion: Today I saw the patient at the bedside, no family was present.  While I was there I assisted the patient to call his wife.  She states that she would be there in about an hour.  He denies any pain other than a headache, but is already asked for medicine from his nurse.  He seems a bit frustrated being stuck in the bed and wants to get "up and moving".  We discussed that PT saw him yesterday and they will hopefully continue to see him daily for ongoing physical therapy.  We discussed recommendations were for SNF/rehab stay.  He denies nausea and vomiting.  When asked about his appetite he chuckled and said "I eat pretty much everything on my tray."  I encouraged him to continue to have good oral intake to help promote strengthening with rehab.  I provided emotional and general support through therapeutic listening, empathy, sharing of stories, and other techniques. I answered all questions and addressed all  concerns to the best of my ability.  I shared that palliative medicine will continue to follow and that I would see him on Friday.  Review of Systems  Respiratory:  Negative for cough and shortness of breath.   Gastrointestinal:  Negative for abdominal pain, nausea and vomiting.  Psychiatric/Behavioral:         Frustration at being bedbound    Objective:   Vital Signs:  BP 109/63 (BP Location: Right Arm)   Pulse 77   Temp 98 F (36.7 C) (Oral)   Resp 18   Ht '5\' 8"'$  (1.727 m)   Wt 82.2 kg   SpO2 96%   BMI 27.55 kg/m   Physical Exam: Physical Exam Vitals and nursing note reviewed.  Constitutional:      General: He is not in acute distress.    Appearance: He is ill-appearing.  HENT:     Head: Normocephalic and atraumatic.  Cardiovascular:     Rate and Rhythm: Normal rate.  Pulmonary:     Effort: Pulmonary effort is normal. No respiratory distress.  Abdominal:     General: Abdomen is flat.     Palpations: Abdomen is soft.  Skin:    General: Skin is warm and dry.  Neurological:     Mental Status: He is alert and oriented to person, place, and time.     Comments: Seems mildly confused  Psychiatric:        Mood and Affect: Mood normal.        Behavior: Behavior normal.     Palliative Assessment/Data: 30%   Assessment & Plan:  Impression: Present on Admission: **None**  81 year old male with acute and chronic illnesses.  He was admitted for hyponatremia, hyperkalemia, AKI on CKD, UTI with drastically rising white blood cell count.  He is currently being treated.  He denies pain, nausea, vomiting.  He does have chronic constipation and we will try to address this.  We had a good and lengthy initial discussion about trajectory of illness, CODE STATUS, goals of care.  Elected FULL CODE and full scope of care at that time. Plan for possible SNF/Rehab. We will continue to follow along.  Overall prognosis guarded.  SUMMARY OF RECOMMENDATIONS   Remain full code/full  scope of care Time for outcomes Further [place] discussions pending evolution of clinical picture Continued spiritual care Anticipate SNF/Rehab based on PT reccs Continue Linzess 145 mcg daily for refractory constipation PMT will continue to follow, will follow-up 10/13  Symptom Management:  Linzess 145 mcg daily for refractory constipation  Code Status: Full code  Prognosis: Unable to determine  Discharge Planning: To Be Determined  Discussed with: Patient, patient's family, medical team, nursing team  Thank you for allowing [place] to participate in the care of Eric Barrett. PMT will continue to support holistically.  Billing based on MDM: High  Problems Addressed: One acute or chronic illness or injury that poses a threat to life or bodily function  Amount and/or Complexity of Data: Category 1:Review of prior external note(s) from each unique source and Review of the result(s) of each unique test and Category 3:Discussion of management or test interpretation with external physician/other qualified health care professional/appropriate source (not separately reported) (labs today: CMP for Cr and electrolytes, CBC for WBC count; most recent outpatient neurology and cardiology notes)  Risks: N/A   Walden Field, NP Palliative Medicine Team  Team Phone # [phone] (Nights/Weekends)  04/05/2021, 8:17 AM

## 2022-05-18 DIAGNOSIS — E871 Hypo-osmolality and hyponatremia: Secondary | ICD-10-CM | POA: Diagnosis not present

## 2022-05-18 DIAGNOSIS — N3001 Acute cystitis with hematuria: Secondary | ICD-10-CM | POA: Diagnosis not present

## 2022-05-18 DIAGNOSIS — Z66 Do not resuscitate: Secondary | ICD-10-CM | POA: Diagnosis not present

## 2022-05-18 DIAGNOSIS — Z7189 Other specified counseling: Secondary | ICD-10-CM | POA: Diagnosis not present

## 2022-05-18 LAB — CBC WITH DIFFERENTIAL/PLATELET
Abs Immature Granulocytes: 0 10*3/uL (ref 0.00–0.07)
Basophils Absolute: 0 10*3/uL (ref 0.0–0.1)
Basophils Relative: 0 %
Eosinophils Absolute: 0 10*3/uL (ref 0.0–0.5)
Eosinophils Relative: 0 %
HCT: 30.7 % — ABNORMAL LOW (ref 39.0–52.0)
Hemoglobin: 11.3 g/dL — ABNORMAL LOW (ref 13.0–17.0)
Lymphocytes Relative: 5 %
Lymphs Abs: 1.5 10*3/uL (ref 0.7–4.0)
MCH: 28.8 pg (ref 26.0–34.0)
MCHC: 36.8 g/dL — ABNORMAL HIGH (ref 30.0–36.0)
MCV: 78.1 fL — ABNORMAL LOW (ref 80.0–100.0)
Monocytes Absolute: 1.2 10*3/uL — ABNORMAL HIGH (ref 0.1–1.0)
Monocytes Relative: 4 %
Neutro Abs: 26.8 10*3/uL — ABNORMAL HIGH (ref 1.7–7.7)
Neutrophils Relative %: 91 %
Platelets: 180 10*3/uL (ref 150–400)
RBC: 3.93 MIL/uL — ABNORMAL LOW (ref 4.22–5.81)
RDW: 14.6 % (ref 11.5–15.5)
WBC: 29.5 10*3/uL — ABNORMAL HIGH (ref 4.0–10.5)
nRBC: 0 % (ref 0.0–0.2)
nRBC: 0 /100 WBC

## 2022-05-18 LAB — RENAL FUNCTION PANEL
Albumin: 2.5 g/dL — ABNORMAL LOW (ref 3.5–5.0)
Anion gap: 9 (ref 5–15)
BUN: 20 mg/dL (ref 8–23)
CO2: 23 mmol/L (ref 22–32)
Calcium: 9.1 mg/dL (ref 8.9–10.3)
Chloride: 102 mmol/L (ref 98–111)
Creatinine, Ser: 1.33 mg/dL — ABNORMAL HIGH (ref 0.61–1.24)
GFR, Estimated: 54 mL/min — ABNORMAL LOW (ref >60)
Glucose, Bld: 126 mg/dL — ABNORMAL HIGH (ref 70–99)
Phosphorus: 3.3 mg/dL (ref 2.5–4.6)
Potassium: 4.3 mmol/L (ref 3.5–5.1)
Sodium: 134 mmol/L — ABNORMAL LOW (ref 135–145)

## 2022-05-18 LAB — URINE CULTURE: Culture: >=100000 — AB

## 2022-05-18 MED ORDER — CIPROFLOXACIN HCL 500 MG PO TABS
500.0000 mg | ORAL_TABLET | Freq: Two times a day (BID) | ORAL | Status: DC
  Administered 2022-05-18 – 2022-05-24 (×12): 500 mg via ORAL
  Filled 2022-05-18 (×14): qty 1

## 2022-05-18 NOTE — Progress Notes (Signed)
Follow up visit with Eric Barrett at his bedside. Chaplain reminded pt of our discussion yesterday regarding finding new coping strategies for managing stress and frustration related to changing capacity. Pt consented to try progressive muscle relaxation. Chaplain engaged pt in 20 minutes of guided muscle relaxation and breathing. Pt reported that he found the technique helpful. Chaplain reinforced learning by raising awareness that sometimes in periods of stress, we hold our muscles contracted without even realizing it. Chaplain engaged pt in reflective listening as he explored ways he used to feel like himself, particularly through listening to his extensive collection of LPs. He also enjoyed helping others record music. Chaplain affirmed pt's strengths and invited him to consider ways he might still be able to practice some of those things despite his impaired mobility. Pt recognized that his office is still set up to listen to music and he could probably still play guitar in bed. Chaplain validated the frustration that things won't be the same as they've always been and encouraged pt to simultaneously celebrate the things he is able to do. Chaplain left the meditation CD in pt's room for him to keep and made his spouse aware as well.  Please page as further needs arise.  [name]. Elyn Peers, M.Div. [facility] Chaplain Pager [phone] Office [phone]

## 2022-05-18 NOTE — TOC Progression Note (Addendum)
Transition of Care (TOC) - Progression Note    Patient Details  Name: Eric Barrett. MRN: [id number] Date of Birth: 1941/07/11  Transition of [facility]) CM/SW Contact  Curlene Labrum, RN Phone Number: 05/18/2022, 4:22 PM  Clinical Narrative:    CM uploaded requested clinicals into the PASRR system - pending at this time.  The patient has available bed offers in the hub at this time.  I called and was unable to reach the wife nor leave a message since her voicemail was full.  I will follow up with her in the morning to offer Medicare choice for SNF placement and ask chosen facility to start insurance authorization.  05/18/2022 - I was able to reach the wife and gave her choice regarding SNF placement and she chose [facility] rehab.  I called Ebony Hail, CM at [facility] and she will start insurance authorization.  PASRR # is approved and noted - [id number].   Expected Discharge Plan: [place] Barriers to Discharge: Continued Medical Work up  Expected Discharge Plan and Services Expected Discharge Plan: [place]   Discharge Planning Services: CM Consult Post Acute Care Choice: [place] Living arrangements for the past 2 months: Apartment                                       Social Determinants of Health (SDOH) Interventions Housing Interventions: Intervention Not Indicated  Readmission Risk Interventions    05/17/2022    4:04 PM  Readmission Risk Prevention Plan  Transportation Screening Complete  PCP or Specialist Appt within 5-7 Days Complete  Home Care Screening Complete  Medication Review (RN CM) Complete

## 2022-05-18 NOTE — Progress Notes (Signed)
Patient ID: [name], male   DOB: [birth year], 81 y.o.   MRN: [id number] S: Feeling better today.  No complaints. O:BP (!) 146/81 (BP Location: Right Arm)   Pulse 69   Temp 98.2 F (36.8 C) (Oral)   Resp 16   Ht '5\' 8"'$  (1.727 m)   Wt 82.2 kg   SpO2 97%   BMI 27.55 kg/m   Intake/Output Summary (Last 24 hours) at 05/18/2022 1002 Last data filed at 05/18/2022 1025 Gross per 24 hour  Intake 4363.82 ml  Output 2300 ml  Net 2063.82 ml   Intake/Output: I/O last 3 completed shifts: In: 4563.8 [P.O.:200; I.V.:4363.8] Out: 3300 [Urine:3300]  Intake/Output this shift:  No intake/output data recorded. Weight change:  Gen: NAD CVS: RRR Resp: CTA Abd: distended, +BS, soft, non-tender Ext: no edema  Recent Labs  Lab 05/15/22 1055 05/15/22 2050 05/16/22 0253 05/16/22 0738 05/16/22 1211 05/16/22 1557 05/16/22 1950 05/17/22 0322 05/18/22 0401  NA 123*   < > 123* 122* 123* 123* 124* 125* 134*  K 5.3*  --  5.3* 6.6* 5.0 4.8 4.2 4.7 4.3  CL 90*  --  88* 89* 91* 91* 92* 94* 102  CO2 26  --  '23 22 22 22 22 24 23  '$ GLUCOSE 127*  --  141* 136* 157* 151* 226* 132* 126*  BUN 11  --  '13 17 21 '$ 27* 32* 30* 20  CREATININE 1.34*  --  2.14* 2.36* 2.45* 2.48* 2.42* 2.05* 1.33*  ALBUMIN 3.5  --   --   --   --   --   --  2.6* 2.5*  CALCIUM 9.1  --  9.3 9.2 8.7* 8.6* 8.8* 8.5* 9.1  PHOS  --   --   --   --   --   --   --   --  3.3  AST 30  --   --   --   --   --   --  26  --   ALT 19  --   --   --   --   --   --  18  --    < > = values in this interval not displayed.   Liver Function Tests: Recent Labs  Lab 05/15/22 1055 05/17/22 0322 05/18/22 0401  AST 30 26  --   ALT 19 18  --   ALKPHOS 41 39  --   BILITOT 0.5 0.5  --   PROT 7.0 5.6*  --   ALBUMIN 3.5 2.6* 2.5*   No results for input(s): "LIPASE", "AMYLASE" in the last 168 hours. Recent Labs  Lab 05/17/22 0322  AMMONIA 16   CBC: Recent Labs  Lab 05/15/22 1055 05/16/22 0253 05/17/22 0322 05/18/22 0401  WBC 5.5  35.5* 33.1* 29.5*  NEUTROABS 3.7  --  29.5* 26.8*  HGB 13.6 13.4 11.5* 11.3*  HCT 39.3 37.7* 32.6* 30.7*  MCV 80.2 78.5* 79.3* 78.1*  PLT 223 205 183 180   Cardiac Enzymes: No results for input(s): "CKTOTAL", "CKMB", "CKMBINDEX", "TROPONINI" in the last 168 hours. CBG: Recent Labs  Lab 05/16/22 1726  GLUCAP 160*    Iron Studies: No results for input(s): "IRON", "TIBC", "TRANSFERRIN", "FERRITIN" in the last 72 hours. Studies/Results: No results found.  aspirin  81 mg Oral Daily   divalproex  500 mg Oral QHS   docusate sodium  100 mg Oral Daily   finasteride  5 mg Oral Daily   heparin  5,000 Units Subcutaneous Q12H  latanoprost  1 drop Both Eyes QHS   linaclotide  145 mcg Oral QAC breakfast   memantine  10 mg Oral BID   mirabegron ER  25 mg Oral Daily   polyethylene glycol  17 g Oral Daily   rosuvastatin  20 mg Oral Daily   sodium zirconium cyclosilicate  10 g Oral Daily   tamsulosin  0.4 mg Oral Daily    BMET    Component Value Date/Time   NA 134 (L) 05/18/2022 0401   NA 122 (L) 05/11/2022 0913   K 4.3 05/18/2022 0401   CL 102 05/18/2022 0401   CO2 23 05/18/2022 0401   GLUCOSE 126 (H) 05/18/2022 0401   BUN 20 05/18/2022 0401   BUN 12 05/11/2022 0913   CREATININE 1.33 (H) 05/18/2022 0401   CALCIUM 9.1 05/18/2022 0401   GFRNONAA 54 (L) 05/18/2022 0401   GFRAA 57 (L) 12/24/2019 1052   CBC    Component Value Date/Time   WBC 29.5 (H) 05/18/2022 0401   RBC 3.93 (L) 05/18/2022 0401   HGB 11.3 (L) 05/18/2022 0401   HGB 14.3 05/11/2022 0913   HCT 30.7 (L) 05/18/2022 0401   HCT 41.6 05/11/2022 0913   PLT 180 05/18/2022 0401   PLT 231 05/11/2022 0913   MCV 78.1 (L) 05/18/2022 0401   MCV 80 05/11/2022 0913   MCH 28.8 05/18/2022 0401   MCHC 36.8 (H) 05/18/2022 0401   RDW 14.6 05/18/2022 0401   RDW 14.2 05/11/2022 0913   LYMPHSABS 1.5 05/18/2022 0401   LYMPHSABS 0.7 05/11/2022 0913   MONOABS 1.2 (H) 05/18/2022 0401   EOSABS 0.0 05/18/2022 0401   EOSABS 0.1  05/11/2022 0913   BASOSABS 0.0 05/18/2022 0401   BASOSABS 0.0 05/11/2022 0913    Assessment/Plan:  Hyponatremia - unclear etiology.  Nothing to support volume depletion.  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  SIADH less likely as sodium has been improving with IVF"s.  He likely has had poor solute intake and high free water intake so "tea and toast" picture could also be contributing.  His sodium continues to improve with IV fluids.  Would continue with IV NS until sodium normalizes then follow off of IVF.  Nothing further to add and will sign off.  Please call with any questions or concerns. Hyperkalemia - resolved with Lokelma, insulin/D5, and bicarb.  Discontinue Losartan and would not restart given hypotension and risks outweigh benefits.  Continue to follow. AKI/CKD stage IIIa - Scr was normal at presentation to ED but quickly doubled.  He does have some urinary retention but no hydronephrosis on [place].  Possible ATN due to recurrent episodes of hypotension.  Continue to hold ARB.  Scr improving toward baseline and good UOP.  Continue with IVF's as above.  No need for outpatient follow up as renal function is returning to baseline.    UTI - culture with staph epidermidis and sensitive to everything except oxacillin (MRSE)  Cultures pending.  Currently on Ceftriaxone and would change to cipro.  Avoid gent/vanc/bactrim due to recent AKI. Urinary retention - follow bladder scans and may require intermittent I&O cath. Continue with tamsulosin and myrbetriq for now. Dementia - stable for now H/o CVA Chronic diastolic CHF - no evidence of volume overload. 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  No  recent dose change.  Donetta Potts, MD [facility]

## 2022-05-18 NOTE — Progress Notes (Signed)
PROGRESS NOTE    Eric Barrett.  ZOX:[id number] DOB: May 18, 1941 DOA: 05/15/2022 PCP: [name], FNP   Brief Narrative:  81 y.o. male with medical history significant of Alzheimer's dementia, HTN, HLD, chronic HFpEF, remote stroke with residual left-sided weakness, glaucoma, seizure disorder presented with altered mental status and dysuria.  Apparently, patient had been drinking 6 to 8 -16 ounces bottles of water recently.  He saw his PCP 4 days ago, blood work showed sodium of 122, UA showed hematuria and pyuria, p.o. antibiotics was ordered however patient had not started antibiotics yet.  Patient presented to the ED more confused and sodium was 123, UA showing hematuria and pyuria again.  He was started on Rocephin and admitted for further management.    Assessment & Plan:   Acute on chronic hyponatremia Likely secondary to poor solute intake Vs SIADH Nephrology consulted, appreciate recs Continue IV fluids for now Daily BMP Monitor closely  Acute metabolic encephalopathy-improving History of Alzheimer's dementia Possibly from UTI and hyponatremia PT/OT eval Continue memantine Delirium precaution  MRSE UTI Currently afebrile, with marked leukocytosis UA positive for UTI UC growing Staph epidermidis >100,000 BC x2 NGTD S/p ceftriaxone, switched to p.o. ciprofloxacin  AKI on CKD stage IIIa Creatinine 1.34 on admission, trended up and peaked to 2.4 Possible ATN given frequent hypotensive episodes VS UTI Renal ultrasound was negative for hydronephrosis Continue to hold ARB, amlodipine Continue IV fluids BMP  Hyperkalemia-improving Potassium trended up to 6.6, now stable Repeat BMP and monitor closely  Hypertension BP now stable Hold amlodipine, losartan  Chronic diastolic CHF Currently compensated Losartan on hold because of AKI  BPH Continue Proscar and Flomax Outpatient follow-up with urology  Seizure disorder Continue Depakote Outpatient  follow-up with neurology  Goals of care Palliative care consultation for goals of care discussion Full scope of care for now     DVT prophylaxis: Heparin subcutaneous Code Status: Full Family Communication: None at bedside Disposition Plan: Status is: Inpatient Remains inpatient appropriate because: Of severity of illness   Consultants: Nephrology/palliative care  Procedures: None  Antimicrobials: Ciprofloxacin    Subjective: Patient denies any new complaints.    Objective: Vitals:   05/17/22 1935 05/18/22 0436 05/18/22 0754 05/18/22 1543  BP: 118/67 (!) 140/76 (!) 146/81 137/75  Pulse: 81 73 69 79  Resp: '18  16 16  '$ Temp: 98.3 F (36.8 C) 98.1 F (36.7 C) 98.2 F (36.8 C) 98.2 F (36.8 C)  TempSrc: Oral Oral Oral Oral  SpO2: 98% 97% 97% 97%  Weight:      Height:        Intake/Output Summary (Last 24 hours) at 05/18/2022 1653 Last data filed at 05/18/2022 1500 Gross per 24 hour  Intake 5106.92 ml  Output 1700 ml  Net 3406.92 ml   Filed Weights   05/15/22 1820  Weight: 82.2 kg    Examination: General: NAD, chronically ill-appearing Cardiovascular: S1, S2 present Respiratory: CTAB Abdomen: Soft, nontender, nondistended, bowel sounds present Musculoskeletal: No bilateral pedal edema noted Skin: Normal Psychiatry: Normal mood     Data Reviewed: I have personally reviewed following labs and imaging studies  CBC: Recent Labs  Lab 05/15/22 1055 05/16/22 0253 05/17/22 0322 05/18/22 0401  WBC 5.5 35.5* 33.1* 29.5*  NEUTROABS 3.7  --  29.5* 26.8*  HGB 13.6 13.4 11.5* 11.3*  HCT 39.3 37.7* 32.6* 30.7*  MCV 80.2 78.5* 79.3* 78.1*  PLT 223 205 183 811   Basic Metabolic Panel: Recent Labs  Lab 05/16/22 1211  05/16/22 1557 05/16/22 1950 05/17/22 0322 05/18/22 0401  NA 123* 123* 124* 125* 134*  K 5.0 4.8 4.2 4.7 4.3  CL 91* 91* 92* 94* 102  CO2 '22 22 22 24 23  '$ GLUCOSE 157* 151* 226* 132* 126*  BUN 21 27* 32* 30* 20  CREATININE 2.45*  2.48* 2.42* 2.05* 1.33*  CALCIUM 8.7* 8.6* 8.8* 8.5* 9.1  PHOS  --   --   --   --  3.3   GFR: Estimated Creatinine Clearance: 45.5 mL/min (A) (by C-G formula based on SCr of 1.33 mg/dL (H)). Liver Function Tests: Recent Labs  Lab 05/15/22 1055 05/17/22 0322 05/18/22 0401  AST 30 26  --   ALT 19 18  --   ALKPHOS 41 39  --   BILITOT 0.5 0.5  --   PROT 7.0 5.6*  --   ALBUMIN 3.5 2.6* 2.5*   No results for input(s): "LIPASE", "AMYLASE" in the last 168 hours. Recent Labs  Lab 05/17/22 0322  AMMONIA 16   Coagulation Profile: No results for input(s): "INR", "PROTIME" in the last 168 hours. Cardiac Enzymes: No results for input(s): "CKTOTAL", "CKMB", "CKMBINDEX", "TROPONINI" in the last 168 hours. BNP (last 3 results) No results for input(s): "PROBNP" in the last 8760 hours. HbA1C: No results for input(s): "HGBA1C" in the last 72 hours. CBG: Recent Labs  Lab 05/16/22 1726  GLUCAP 160*   Lipid Profile: No results for input(s): "CHOL", "HDL", "LDLCALC", "TRIG", "CHOLHDL", "LDLDIRECT" in the last 72 hours. Thyroid Function Tests: Recent Labs    05/17/22 0322  TSH 0.797   Anemia Panel: Recent Labs    05/17/22 0322  VITAMINB12 652   Sepsis Labs: No results for input(s): "PROCALCITON", "LATICACIDVEN" in the last 168 hours.  Recent Results (from the past 240 hour(s))  Urine Culture     Status: Abnormal   Collection Time: 05/16/22  7:22 AM   Specimen: Urine, Clean Catch  Result Value Ref Range Status   Specimen Description URINE, CLEAN CATCH  Final   Special Requests   Final    NONE Performed at [facility], [address], [place], [place] [address]    Culture >=100,000 COLONIES/mL STAPHYLOCOCCUS EPIDERMIDIS (A)  Final   Report Status 05/18/2022 FINAL  Final   Organism ID, Bacteria STAPHYLOCOCCUS EPIDERMIDIS (A)  Final      Susceptibility   Staphylococcus epidermidis - MIC*    CIPROFLOXACIN <=0.5 SENSITIVE Sensitive     GENTAMICIN <=0.5 SENSITIVE  Sensitive     NITROFURANTOIN <=16 SENSITIVE Sensitive     OXACILLIN >=4 RESISTANT Resistant     TETRACYCLINE 2 SENSITIVE Sensitive     VANCOMYCIN 2 SENSITIVE Sensitive     TRIMETH/SULFA 20 SENSITIVE Sensitive     CLINDAMYCIN <=0.25 SENSITIVE Sensitive     RIFAMPIN <=0.5 SENSITIVE Sensitive     Inducible Clindamycin NEGATIVE Sensitive     * >=100,000 COLONIES/mL STAPHYLOCOCCUS EPIDERMIDIS  Culture, blood (Routine X 2) w Reflex to ID Panel     Status: None (Preliminary result)   Collection Time: 05/16/22  7:38 AM   Specimen: BLOOD  Result Value Ref Range Status   Specimen Description BLOOD RIGHT ANTECUBITAL  Final   Special Requests   Final    BOTTLES DRAWN AEROBIC AND ANAEROBIC Blood Culture adequate volume   Culture   Final    NO GROWTH 2 DAYS Performed at [facility], [address] [place], [place] [address]    Report Status PENDING  Incomplete  Culture, blood (Routine X 2) w Reflex to ID Panel     Status: None (Preliminary result)   Collection Time: 05/16/22  7:41 AM   Specimen: BLOOD LEFT ARM  Result Value Ref Range Status   Specimen Description BLOOD LEFT ARM  Final   Special Requests   Final    BOTTLES DRAWN AEROBIC AND ANAEROBIC Blood Culture adequate volume   Culture   Final    NO GROWTH 2 DAYS Performed at [facility], [address] [place], [place] [address]    Report Status PENDING  Incomplete         Radiology Studies: No results found.      Scheduled Meds:  aspirin  81 mg Oral Daily   ciprofloxacin  500 mg Oral BID   divalproex  500 mg Oral QHS   docusate sodium  100 mg Oral Daily   finasteride  5 mg Oral Daily   heparin  5,000 Units Subcutaneous Q12H   latanoprost  1 drop Both Eyes QHS   linaclotide  145 mcg Oral QAC breakfast   memantine  10 mg Oral BID   mirabegron ER  25 mg Oral Daily   polyethylene glycol  17 g Oral Daily   rosuvastatin  20 mg Oral Daily   sodium zirconium cyclosilicate  10 g Oral Daily   tamsulosin  0.4  mg Oral Daily   Continuous Infusions:  sodium chloride 75 mL/hr at 05/18/22 0827          Alma Friendly, MD [facility] 05/18/2022, 4:53 PM

## 2022-05-19 DIAGNOSIS — Z66 Do not resuscitate: Secondary | ICD-10-CM | POA: Diagnosis not present

## 2022-05-19 DIAGNOSIS — E871 Hypo-osmolality and hyponatremia: Secondary | ICD-10-CM | POA: Diagnosis not present

## 2022-05-19 DIAGNOSIS — R319 Hematuria, unspecified: Secondary | ICD-10-CM

## 2022-05-19 DIAGNOSIS — N3001 Acute cystitis with hematuria: Secondary | ICD-10-CM | POA: Diagnosis not present

## 2022-05-19 DIAGNOSIS — D72829 Elevated white blood cell count, unspecified: Secondary | ICD-10-CM

## 2022-05-19 DIAGNOSIS — N390 Urinary tract infection, site not specified: Secondary | ICD-10-CM

## 2022-05-19 DIAGNOSIS — Z515 Encounter for palliative care: Secondary | ICD-10-CM | POA: Diagnosis not present

## 2022-05-19 DIAGNOSIS — Z7189 Other specified counseling: Secondary | ICD-10-CM | POA: Diagnosis not present

## 2022-05-19 LAB — CBC WITH DIFFERENTIAL/PLATELET
Abs Immature Granulocytes: 0.17 10*3/uL — ABNORMAL HIGH (ref 0.00–0.07)
Basophils Absolute: 0.1 10*3/uL (ref 0.0–0.1)
Basophils Relative: 0 %
Eosinophils Absolute: 0.1 10*3/uL (ref 0.0–0.5)
Eosinophils Relative: 0 %
HCT: 33.5 % — ABNORMAL LOW (ref 39.0–52.0)
Hemoglobin: 11.9 g/dL — ABNORMAL LOW (ref 13.0–17.0)
Immature Granulocytes: 1 %
Lymphocytes Relative: 9 %
Lymphs Abs: 1.7 10*3/uL (ref 0.7–4.0)
MCH: 28.6 pg (ref 26.0–34.0)
MCHC: 35.5 g/dL (ref 30.0–36.0)
MCV: 80.5 fL (ref 80.0–100.0)
Monocytes Absolute: 1 10*3/uL (ref 0.1–1.0)
Monocytes Relative: 6 %
Neutro Abs: 15.3 10*3/uL — ABNORMAL HIGH (ref 1.7–7.7)
Neutrophils Relative %: 84 %
Platelets: 189 10*3/uL (ref 150–400)
RBC: 4.16 MIL/uL — ABNORMAL LOW (ref 4.22–5.81)
RDW: 14.7 % (ref 11.5–15.5)
WBC: 18.3 10*3/uL — ABNORMAL HIGH (ref 4.0–10.5)
nRBC: 0 % (ref 0.0–0.2)

## 2022-05-19 LAB — RENAL FUNCTION PANEL
Albumin: 2.5 g/dL — ABNORMAL LOW (ref 3.5–5.0)
Anion gap: 5 (ref 5–15)
BUN: 13 mg/dL (ref 8–23)
CO2: 27 mmol/L (ref 22–32)
Calcium: 8.6 mg/dL — ABNORMAL LOW (ref 8.9–10.3)
Chloride: 105 mmol/L (ref 98–111)
Creatinine, Ser: 1.33 mg/dL — ABNORMAL HIGH (ref 0.61–1.24)
GFR, Estimated: 54 mL/min — ABNORMAL LOW (ref >60)
Glucose, Bld: 109 mg/dL — ABNORMAL HIGH (ref 70–99)
Phosphorus: 3.2 mg/dL (ref 2.5–4.6)
Potassium: 4.3 mmol/L (ref 3.5–5.1)
Sodium: 137 mmol/L (ref 135–145)

## 2022-05-19 MED ORDER — AMLODIPINE BESYLATE 10 MG PO TABS
10.0000 mg | ORAL_TABLET | Freq: Every day | ORAL | Status: DC
  Administered 2022-05-19 – 2022-05-24 (×6): 10 mg via ORAL
  Filled 2022-05-19 (×6): qty 1

## 2022-05-19 NOTE — Progress Notes (Signed)
Daily Progress Note   Patient Name: Eric Barrett.       Date: 05/19/2022 DOB: 10-Sep-1940  Age: 81 y.o. MRN#: [id number] Attending Physician: Alma Friendly, MD Primary Care Physician: [name], FNP Admit Date: 05/15/2022 Length of Stay: 4 days  Reason for Consultation/Follow-up: Establishing goals of care  HPI/Patient Profile:  81 y.o. male  with past medical history of Alzheimer's dementia, HTN, HLD, chronic HFpEF, remote stroke with residual left-sided weakness, glaucoma, seizure disorder presented with altered mental status and dysuria. Has been drinking a lot of water due to ongoing constipation. He was admitted on 05/15/2022 with acute metabolic encephalopathy, acute on chronic hyponatremia, AKI on CKD 3B, hyperkalemia, UTI, leukocytosis, and others.   Subjective:   Subjective: Chart Reviewed. Updates received. Patient Assessed. Created space and opportunity for patient  and family to explore thoughts and feelings regarding current medical situation.  Today's Discussion: Today I saw the patient at the bedside, no family was present.  He was more alert, awake, more oriented.  He was very conversational and pleasant.  Denied pain, nausea, vomiting.  He states he likes the Linzess as it is allowing him to have regular, good bowel movements without diarrhea.  He has no complaints or questions today.  He states his wife is on the way and she may have questions.  I offered that I would return later in the afternoon to speak with her.  Later in the day I did return to speak with his wife.  She expressed happiness that he seems to be doing better.  We discussed his current health status with improving WBC count and improving kidney function.  His appetite remains very good and is generally eating 100% of his meal trays.  We discussed overall plan for discharge to SNF/rehab for strengthening prior to returning home.  They are asking about continued PT services while in the hospital  and awaiting SNF/rehab discharge.  They are also planning to speak with TOC about facility placement for his SNF/rehab.  They are in agreement.  I spoke with the nurse separately at the nurses station he states that he is on "someone's list" for later this afternoon, either PT or mobility.  I provided emotional and general support through therapeutic listening, empathy, sharing of stories, and other techniques. I answered all questions and addressed all concerns to the best of my ability.  Review of Systems  Constitutional:  Negative for appetite change.  Respiratory:  Negative for cough and shortness of breath.   Gastrointestinal:  Negative for abdominal pain, constipation, diarrhea, nausea and vomiting.    Objective:   Vital Signs:  BP 136/88 (BP Location: Left Arm)   Pulse 67   Temp 97.7 F (36.5 C) (Oral)   Resp 18   Ht '5\' 8"'$  (3.825 m)   Wt 82.2 kg   SpO2 98%   BMI 27.55 kg/m   Physical Exam: Physical Exam Vitals and nursing note reviewed.  Constitutional:      General: He is not in acute distress. HENT:     Head: Normocephalic and atraumatic.  Cardiovascular:     Rate and Rhythm: Normal rate.  Pulmonary:     Effort: Pulmonary effort is normal. No respiratory distress.  Abdominal:     General: Abdomen is flat. There is no distension.     Palpations: Abdomen is soft.     Tenderness: There is no abdominal tenderness.  Skin:    General: Skin is warm and dry.  Neurological:     Mental Status: He is alert and oriented to person, place, and time.  Psychiatric:        Mood and Affect: Mood normal.        Behavior: Behavior normal.     Palliative Assessment/Data: 40%   Assessment & Plan:   Impression: Present on Admission: **None**  81 year old male with acute and chronic illnesses.  He was admitted for hyponatremia, hyperkalemia, AKI on CKD, UTI with drastically rising white blood cell count.  He is currently being treated with antibiotics and noted  improvement in WBC count.  He denies pain, nausea, vomiting.  He does have chronic constipation and has tried multiple over-the-counter options but seems to be doing better in the hospital on Linzess 145 mcg/day.  We had a good and lengthy initial discussion about trajectory of illness, CODE STATUS, goals of care.  Elected FULL CODE and full scope of care at that time.  Overall seems to be improving clinically.  Plan for SNF/Rehab. Overall prognosis guarded.  SUMMARY OF RECOMMENDATIONS   Remain full code/full scope of care Continued spiritual care Anticipate SNF/Rehab based on PT reccs Continue Linzess 145 mcg daily for refractory constipation PMT will continue to follow, will follow-up 10/13  Symptom Management:  Linzess 145 mcg daily for refractory constipation  Code Status: Full code  Prognosis: Unable to determine  Discharge Planning: To Be Determined  Discussed with: Patient, patient's family, medical team, nursing team  Thank you for allowing [place] to participate in the care of Birt Reinoso. PMT will continue to support holistically.  Billing based on MDM: High  Problems Addressed: One acute or chronic illness or injury that poses a threat to life or bodily function  Amount and/or Complexity of Data: Category 1:Review of prior external note(s) from each unique source and Review of the result(s) of each unique test and Category 3:Discussion of management or test interpretation with external physician/other qualified health care professional/appropriate source (not separately reported) (labs today: CMP for Cr and electrolytes, CBC for WBC count; most recent outpatient neurology and cardiology notes)  Risks: N/A   Walden Field, NP Palliative Medicine Team  Team Phone # [phone] (Nights/Weekends)  04/05/2021, 8:17 AM

## 2022-05-19 NOTE — TOC Progression Note (Addendum)
Transition of Care (TOC) - Progression Note    Patient Details  Name: Chaitanya Amedee. MRN: [id number] Date of Birth: September 10, 1940  Transition of Care [facility]) CM/SW [place], RN Phone Number: 05/19/2022, 2:03 PM  Clinical Narrative:    CM called and spoke with Ebony Hail, CM with [facility] rehabilitation SNF and the patient's insurance authorization is pending at this time.  [facility] SNF is able to accept admissions over the weekend if needed.  I met with the patient's wife at the bedside and so does not want the patient to discharge to [facility] rehab.   Ebony Hail, CM was updated.  [name] wife requested that I reach out to [facility] location to start the authorization.  Pending authorization.     Expected Discharge Plan: [place] Barriers to Discharge: Continued Medical Work up  Expected Discharge Plan and Services Expected Discharge Plan: [place]   Discharge Planning Services: CM Consult Post Acute Care Choice: [place] Living arrangements for the past 2 months: Apartment                                       Social Determinants of Health (SDOH) Interventions Housing Interventions: Intervention Not Indicated  Readmission Risk Interventions    05/17/2022    4:04 PM  Readmission Risk Prevention Plan  Transportation Screening Complete  PCP or Specialist Appt within 5-7 Days Complete  Home Care Screening Complete  Medication Review (RN CM) Complete

## 2022-05-19 NOTE — Progress Notes (Signed)
PROGRESS NOTE    Eric Barrett.  LKG:[id number] DOB: [birth year] DOA: 05/15/2022 PCP: [name], FNP   Brief Narrative:  81 y.o. male with medical history significant of Alzheimer's dementia, HTN, HLD, chronic HFpEF, remote stroke with residual left-sided weakness, glaucoma, seizure disorder presented with altered mental status and dysuria.  Apparently, patient had been drinking 6 to 8 -16 ounces bottles of water recently.  He saw his PCP 4 days ago, blood work showed sodium of 122, UA showed hematuria and pyuria, p.o. antibiotics was ordered however patient had not started antibiotics yet.  Patient presented to the ED more confused and sodium was 123, UA showing hematuria and pyuria again.  He was started on Rocephin and admitted for further management.    Assessment & Plan:   Acute on chronic hyponatremia Improved Likely secondary to poor solute intake Vs SIADH Nephrology consulted, appreciate recs S/P IV fluids Daily BMP  Acute metabolic encephalopathy-improving History of Alzheimer's dementia Possibly from UTI and hyponatremia PT/OT eval Continue memantine Delirium precaution  MRSE UTI Currently afebrile, with resolving leukocytosis UA positive for UTI UC growing Staph epidermidis >100,000 BC x2 NGTD S/p ceftriaxone, switched to p.o. ciprofloxacin  AKI on CKD stage IIIa Improving Creatinine 1.34 on admission, trended up and peaked to 2.4 Possible ATN given frequent hypotensive episodes VS UTI Renal ultrasound was negative for hydronephrosis Continue to hold ARB BMP  Hyperkalemia-resolved Potassium trended up to 6.6, now stable Repeat BMP  Hypertension BP now uncontrolled Restart amlodipine, continue to hold losartan  Chronic diastolic CHF Currently compensated Losartan on hold because of AKI  BPH Continue Proscar and Flomax Outpatient follow-up with urology  Seizure disorder Continue Depakote Outpatient follow-up with neurology  Goals of  care Palliative care consultation for goals of care discussion Full scope of care for now     DVT prophylaxis: Heparin subcutaneous Code Status: Full Family Communication: None at bedside Disposition Plan: Status is: Inpatient Remains inpatient appropriate because: Of severity of illness   Consultants: Nephrology/palliative care  Procedures: None  Antimicrobials: Ciprofloxacin    Subjective: Patient denies any new complaints.    Objective: Vitals:   05/19/22 0413 05/19/22 0815 05/19/22 1634 05/19/22 1750  BP: (!) 148/82 136/88 (!) 175/86 (!) 157/87  Pulse: 70 67 91 87  Resp: '18 18 18   '$ Temp: 98.2 F (36.8 C) 97.7 F (36.5 C) 98.4 F (36.9 C)   TempSrc:  Oral Oral   SpO2: 98% 98% 98%   Weight:      Height:        Intake/Output Summary (Last 24 hours) at 05/19/2022 1809 Last data filed at 05/19/2022 1746 Gross per 24 hour  Intake 480 ml  Output 1900 ml  Net -1420 ml   Filed Weights   05/15/22 1820  Weight: 82.2 kg    Examination: General: NAD, chronically ill-appearing Cardiovascular: S1, S2 present Respiratory: CTAB Abdomen: Soft, nontender, nondistended, bowel sounds present Musculoskeletal: No bilateral pedal edema noted Skin: Normal Psychiatry: Normal mood     Data Reviewed: I have personally reviewed following labs and imaging studies  CBC: Recent Labs  Lab 05/15/22 1055 05/16/22 0253 05/17/22 0322 05/18/22 0401 05/19/22 0351  WBC 5.5 35.5* 33.1* 29.5* 18.3*  NEUTROABS 3.7  --  29.5* 26.8* 15.3*  HGB 13.6 13.4 11.5* 11.3* 11.9*  HCT 39.3 37.7* 32.6* 30.7* 33.5*  MCV 80.2 78.5* 79.3* 78.1* 80.5  PLT 223 205 183 180 664   Basic Metabolic Panel: Recent Labs  Lab 05/16/22 1557  05/16/22 1950 05/17/22 0322 05/18/22 0401 05/19/22 0351  NA 123* 124* 125* 134* 137  K 4.8 4.2 4.7 4.3 4.3  CL 91* 92* 94* 102 105  CO2 '22 22 24 23 27  '$ GLUCOSE 151* 226* 132* 126* 109*  BUN 27* 32* 30* 20 13  CREATININE 2.48* 2.42* 2.05* 1.33*  1.33*  CALCIUM 8.6* 8.8* 8.5* 9.1 8.6*  PHOS  --   --   --  3.3 3.2   GFR: Estimated Creatinine Clearance: 45.5 mL/min (A) (by C-G formula based on SCr of 1.33 mg/dL (H)). Liver Function Tests: Recent Labs  Lab 05/15/22 1055 05/17/22 0322 05/18/22 0401 05/19/22 0351  AST 30 26  --   --   ALT 19 18  --   --   ALKPHOS 41 39  --   --   BILITOT 0.5 0.5  --   --   PROT 7.0 5.6*  --   --   ALBUMIN 3.5 2.6* 2.5* 2.5*   No results for input(s): "LIPASE", "AMYLASE" in the last 168 hours. Recent Labs  Lab 05/17/22 0322  AMMONIA 16   Coagulation Profile: No results for input(s): "INR", "PROTIME" in the last 168 hours. Cardiac Enzymes: No results for input(s): "CKTOTAL", "CKMB", "CKMBINDEX", "TROPONINI" in the last 168 hours. BNP (last 3 results) No results for input(s): "PROBNP" in the last 8760 hours. HbA1C: No results for input(s): "HGBA1C" in the last 72 hours. CBG: Recent Labs  Lab 05/16/22 1726  GLUCAP 160*   Lipid Profile: No results for input(s): "CHOL", "HDL", "LDLCALC", "TRIG", "CHOLHDL", "LDLDIRECT" in the last 72 hours. Thyroid Function Tests: Recent Labs    05/17/22 0322  TSH 0.797   Anemia Panel: Recent Labs    05/17/22 0322  VITAMINB12 652   Sepsis Labs: No results for input(s): "PROCALCITON", "LATICACIDVEN" in the last 168 hours.  Recent Results (from the past 240 hour(s))  Urine Culture     Status: Abnormal   Collection Time: 05/16/22  7:22 AM   Specimen: Urine, Clean Catch  Result Value Ref Range Status   Specimen Description URINE, CLEAN CATCH  Final   Special Requests   Final    NONE Performed at [facility], [address], [place], [place] [address]    Culture >=100,000 COLONIES/mL STAPHYLOCOCCUS EPIDERMIDIS (A)  Final   Report Status 05/18/2022 FINAL  Final   Organism ID, Bacteria STAPHYLOCOCCUS EPIDERMIDIS (A)  Final      Susceptibility   Staphylococcus epidermidis - MIC*    CIPROFLOXACIN <=0.5 SENSITIVE Sensitive      GENTAMICIN <=0.5 SENSITIVE Sensitive     NITROFURANTOIN <=16 SENSITIVE Sensitive     OXACILLIN >=4 RESISTANT Resistant     TETRACYCLINE 2 SENSITIVE Sensitive     VANCOMYCIN 2 SENSITIVE Sensitive     TRIMETH/SULFA 20 SENSITIVE Sensitive     CLINDAMYCIN <=0.25 SENSITIVE Sensitive     RIFAMPIN <=0.5 SENSITIVE Sensitive     Inducible Clindamycin NEGATIVE Sensitive     * >=100,000 COLONIES/mL STAPHYLOCOCCUS EPIDERMIDIS  Culture, blood (Routine X 2) w Reflex to ID Panel     Status: None (Preliminary result)   Collection Time: 05/16/22  7:38 AM   Specimen: BLOOD  Result Value Ref Range Status   Specimen Description BLOOD RIGHT ANTECUBITAL  Final   Special Requests   Final    BOTTLES DRAWN AEROBIC AND ANAEROBIC Blood Culture adequate volume   Culture   Final    NO GROWTH 3 DAYS Performed at [facility],  [address] [place], [place] [address]    Report Status PENDING  Incomplete  Culture, blood (Routine X 2) w Reflex to ID Panel     Status: None (Preliminary result)   Collection Time: 05/16/22  7:41 AM   Specimen: BLOOD LEFT ARM  Result Value Ref Range Status   Specimen Description BLOOD LEFT ARM  Final   Special Requests   Final    BOTTLES DRAWN AEROBIC AND ANAEROBIC Blood Culture adequate volume   Culture   Final    NO GROWTH 3 DAYS Performed at [facility], [place] [address], [place], [place] [address]    Report Status PENDING  Incomplete         Radiology Studies: No results found.      Scheduled Meds:  aspirin  81 mg Oral Daily   ciprofloxacin  500 mg Oral BID   divalproex  500 mg Oral QHS   docusate sodium  100 mg Oral Daily   finasteride  5 mg Oral Daily   heparin  5,000 Units Subcutaneous Q12H   latanoprost  1 drop Both Eyes QHS   linaclotide  145 mcg Oral QAC breakfast   memantine  10 mg Oral BID   mirabegron ER  25 mg Oral Daily   polyethylene glycol  17 g Oral Daily   rosuvastatin  20 mg Oral Daily   tamsulosin  0.4 mg Oral Daily    Continuous Infusions:          Alma Friendly, MD [facility] 05/19/2022, 6:09 PM

## 2022-05-19 NOTE — Progress Notes (Signed)
Physical Therapy Treatment Patient Details Name: Eric Barrett. MRN: [id number] DOB: 1941-04-10 Today's Date: 05/19/2022   History of Present Illness 81 yo male presents [facility] on 10/9 with hyponatremia, AMS, dysuria. PMH includes dementia, HTN, HLD, chronic HFpEF, remote stroke with residual left-sided weakness, glaucoma, seizure disorder.    PT Comments    Pt received in supine, agreeable to therapy session, with good participation and improved tolerance for transfer and gait training with RW this date. Pt able to perform household distance gait trial with +1 minA and transfers with multimodal cues and minA (+2 for safety but only needing +1 physical assist). Pt continues to benefit from PT services to progress toward functional mobility goals.    Recommendations for follow up therapy are one component of a multi-disciplinary discharge planning process, led by the attending physician.  Recommendations may be updated based on patient status, additional functional criteria and insurance authorization.  Follow Up Recommendations  Skilled nursing-short term rehab (<3 hours/day) Can patient physically be transported by private vehicle: Yes   Assistance Recommended at Discharge Frequent or constant Supervision/Assistance  Patient can return home with the following A lot of help with walking and/or transfers;A lot of help with bathing/dressing/bathroom   Equipment Recommendations  None recommended by PT    Recommendations for Other Services OT consult     Precautions / Restrictions Precautions Precautions: Fall Precaution Comments: seizure precs Restrictions Weight Bearing Restrictions: No     Mobility  Bed Mobility Overal bed mobility: Needs Assistance Bed Mobility: Rolling, Sidelying to Sit Rolling: Min guard Sidelying to sit: Min assist       General bed mobility comments: multimodal cues to sequence, use of bed rail    Transfers Overall transfer level: Needs  assistance Equipment used: 2 person hand held assist, Rolling walker (2 wheels) Transfers: Sit to/from Stand, Bed to chair/wheelchair/BSC Sit to Stand: +2 physical assistance, Min assist   Step pivot transfers: +2 physical assistance, Min assist       General transfer comment: +2 minA for STS with HHA from EOB and pivotal steps to chair. Then, pt stood from chair x2 with RW and hand over hand assist for safe UE placement with +1 minA    Ambulation/Gait Ambulation/Gait assistance: Min assist Gait Distance (Feet): 50 Feet Assistive device: Rolling walker (2 wheels) Gait Pattern/deviations: Step-to pattern, Decreased dorsiflexion - right, Decreased dorsiflexion - left       General Gait Details: Cues for posture, improved step length/height but pt unable to significantly increase stride length   Stairs             Wheelchair Mobility    Modified Rankin (Stroke Patients Only)       Balance Overall balance assessment: Needs assistance Sitting-balance support: Feet supported, No upper extremity supported Sitting balance-Leahy Scale: Good     Standing balance support: Bilateral upper extremity supported, During functional activity Standing balance-Leahy Scale: Fair Standing balance comment: fair static balance with RW needing up to minA for safety for dynamic tasks                            Cognition Arousal/Alertness: Awake/alert Behavior During Therapy: WFL for tasks assessed/performed Overall Cognitive Status: History of cognitive impairments - at baseline                                 General Comments: Pleasantly  cooperative, following most simple commands well with multimodal cues.        Exercises Other Exercises Other Exercises: standing hip flexion x10 reps with B HHA    General Comments General comments (skin integrity, edema, etc.): VSS on RA, BP somewhat elevated post-ambulation NT charted in flowsheet 175/86       Pertinent Vitals/Pain Pain Assessment Pain Assessment: No/denies pain     PT Goals (current goals can now be found in the care plan section) Acute Rehab PT Goals Patient Stated Goal: to get better so I can go home PT Goal Formulation: With patient Time For Goal Achievement: 05/30/22 Progress towards PT goals: Progressing toward goals    Frequency    Min 2X/week      PT Plan Current plan remains appropriate       AM-PAC PT "6 Clicks" Mobility   Outcome Measure  Help needed turning from your back to your side while in a flat bed without using bedrails?: A Little Help needed moving from lying on your back to sitting on the side of a flat bed without using bedrails?: A Lot Help needed moving to and from a bed to a chair (including a wheelchair)?: A Lot (mod cues) Help needed standing up from a chair using your arms (e.g., wheelchair or bedside chair)?: A Lot (mod cues) Help needed to walk in hospital room?: A Little Help needed climbing 3-5 steps with a railing? : Total 6 Click Score: 13    End of Session Equipment Utilized During Treatment: Gait belt Activity Tolerance: Patient tolerated treatment well Patient left: with call bell/phone within reach;with family/visitor present;in chair;with chair alarm set (spouse in room) Nurse Communication: Mobility status;Other (comment) (ok to mobilize with +1 assist) PT Visit Diagnosis: Other abnormalities of gait and mobility (R26.89);Muscle weakness (generalized) (M62.81)     Time: 8786-7672 PT Time Calculation (min) (ACUTE ONLY): 22 min  Charges:  $Gait Training: 8-22 mins                     Tayvia Faughnan P., PTA Acute Rehabilitation Services Secure Chat Preferred 9a-5:30pm Office: [place] 05/19/2022, 4:54 PM

## 2022-05-19 NOTE — Care Management Important Message (Signed)
Important Message  Patient Details  Name: Eric Barrett. MRN: [id number] Date of Birth: 1941-07-17   Medicare Important Message Given:  Yes     [name] 05/19/2022, 2:26 PM

## 2022-05-19 NOTE — Progress Notes (Signed)
Uneventful shift spent overnight, attempts to have bowel movement  but only clear secretions of about 20 ml. Vital signs stable, all safety measures in place observation continues.

## 2022-05-20 DIAGNOSIS — E871 Hypo-osmolality and hyponatremia: Secondary | ICD-10-CM | POA: Diagnosis not present

## 2022-05-20 DIAGNOSIS — E875 Hyperkalemia: Secondary | ICD-10-CM | POA: Diagnosis not present

## 2022-05-20 DIAGNOSIS — Z515 Encounter for palliative care: Secondary | ICD-10-CM | POA: Diagnosis not present

## 2022-05-20 DIAGNOSIS — R4182 Altered mental status, unspecified: Secondary | ICD-10-CM

## 2022-05-20 DIAGNOSIS — Z7189 Other specified counseling: Secondary | ICD-10-CM | POA: Diagnosis not present

## 2022-05-20 LAB — CBC WITH DIFFERENTIAL/PLATELET
Abs Immature Granulocytes: 0.09 10*3/uL — ABNORMAL HIGH (ref 0.00–0.07)
Basophils Absolute: 0.1 10*3/uL (ref 0.0–0.1)
Basophils Relative: 1 %
Eosinophils Absolute: 0.1 10*3/uL (ref 0.0–0.5)
Eosinophils Relative: 1 %
HCT: 35.9 % — ABNORMAL LOW (ref 39.0–52.0)
Hemoglobin: 12.7 g/dL — ABNORMAL LOW (ref 13.0–17.0)
Immature Granulocytes: 1 %
Lymphocytes Relative: 16 %
Lymphs Abs: 2 10*3/uL (ref 0.7–4.0)
MCH: 28.4 pg (ref 26.0–34.0)
MCHC: 35.4 g/dL (ref 30.0–36.0)
MCV: 80.3 fL (ref 80.0–100.0)
Monocytes Absolute: 1.6 10*3/uL — ABNORMAL HIGH (ref 0.1–1.0)
Monocytes Relative: 12 %
Neutro Abs: 9.2 10*3/uL — ABNORMAL HIGH (ref 1.7–7.7)
Neutrophils Relative %: 69 %
Platelets: 194 10*3/uL (ref 150–400)
RBC: 4.47 MIL/uL (ref 4.22–5.81)
RDW: 14.4 % (ref 11.5–15.5)
WBC: 13.1 10*3/uL — ABNORMAL HIGH (ref 4.0–10.5)
nRBC: 0 % (ref 0.0–0.2)

## 2022-05-20 LAB — RENAL FUNCTION PANEL
Albumin: 2.7 g/dL — ABNORMAL LOW (ref 3.5–5.0)
Anion gap: 4 — ABNORMAL LOW (ref 5–15)
BUN: 12 mg/dL (ref 8–23)
CO2: 27 mmol/L (ref 22–32)
Calcium: 8.8 mg/dL — ABNORMAL LOW (ref 8.9–10.3)
Chloride: 103 mmol/L (ref 98–111)
Creatinine, Ser: 1.27 mg/dL — ABNORMAL HIGH (ref 0.61–1.24)
GFR, Estimated: 57 mL/min — ABNORMAL LOW (ref >60)
Glucose, Bld: 102 mg/dL — ABNORMAL HIGH (ref 70–99)
Phosphorus: 3.5 mg/dL (ref 2.5–4.6)
Potassium: 3.6 mmol/L (ref 3.5–5.1)
Sodium: 134 mmol/L — ABNORMAL LOW (ref 135–145)

## 2022-05-20 NOTE — Plan of Care (Signed)
    Problem: Education: Goal: Knowledge of General Education information will improve Description: Including pain rating scale, medication(s)/side effects and non-pharmacologic comfort measures Outcome: Not Progressing   Problem: Health Behavior/Discharge Planning: Goal: Ability to manage health-related needs will improve Outcome: Progressing   Problem: Coping: Goal: Level of anxiety will decrease Outcome: Progressing   Problem: Pain Managment: Goal: General experience of comfort will improve Outcome: Progressing   Problem: Skin Integrity: Goal: Risk for impaired skin integrity will decrease Outcome: Progressing

## 2022-05-20 NOTE — Progress Notes (Signed)
Mobility Specialist Progress Note:   05/20/22 1715  Mobility  Activity Ambulated with assistance in room;Transferred from bed to chair  Level of Assistance Contact guard assist, steadying assist  Assistive Device Front wheel walker  Activity Response Tolerated well  Mobility Referral Yes  $Mobility charge 1 Mobility   Pt received in bed and requesting transfer to chair to eat dinner. No complaints. Pt left in chair with all needs met and call bell in reach.   Jashawna Reever Mobility Specialist-Acute Rehab Secure Chat only

## 2022-05-20 NOTE — Progress Notes (Signed)
PROGRESS NOTE    Eric Barrett.  IFO:[id number] DOB: 1940-08-29 DOA: 05/15/2022 PCP: [name], FNP   Brief Narrative:  81 y.o. male with medical history significant of Alzheimer's dementia, HTN, HLD, chronic HFpEF, remote stroke with residual left-sided weakness, glaucoma, seizure disorder presented with altered mental status and dysuria.  Apparently, patient had been drinking 6 to 8 -16 ounces bottles of water recently.  He saw his PCP 4 days ago, blood work showed sodium of 122, UA showed hematuria and pyuria, p.o. antibiotics was ordered however patient had not started antibiotics yet.  Patient presented to the ED more confused and sodium was 123, UA showing hematuria and pyuria again.  He was started on Rocephin and admitted for further management.    Assessment & Plan:   Acute on chronic hyponatremia Improved Likely secondary to poor solute intake Vs SIADH Nephrology consulted, appreciate recs S/P IV fluids Daily BMP  Acute metabolic encephalopathy-improving History of Alzheimer's dementia Possibly from UTI and hyponatremia PT/OT eval Continue memantine Delirium precaution  MRSE UTI Currently afebrile, with resolving leukocytosis UA positive for UTI UC growing Staph epidermidis >100,000 BC x2 NGTD S/p ceftriaxone, switched to p.o. ciprofloxacin  AKI on CKD stage IIIa Improving Creatinine 1.34 on admission, trended up and peaked to 2.4 Possible ATN given frequent hypotensive episodes VS UTI Renal ultrasound was negative for hydronephrosis Continue to hold ARB BMP  Hyperkalemia-resolved Potassium trended up to 6.6, now stable Repeat BMP  Hypertension BP now uncontrolled Restart amlodipine, continue to hold losartan  Chronic diastolic CHF Currently compensated Losartan on hold because of AKI  BPH Continue Proscar and Flomax Outpatient follow-up with urology  Seizure disorder Continue Depakote Outpatient follow-up with neurology  Goals of  care Palliative care consultation for goals of care discussion Full scope of care for now     DVT prophylaxis: Heparin subcutaneous Code Status: Full Family Communication: None at bedside Disposition Plan: Status is: Inpatient Remains inpatient appropriate because: Of severity of illness   Consultants: Nephrology/palliative care  Procedures: None  Antimicrobials: Ciprofloxacin    Subjective: Patient denies any new complaints.    Objective: Vitals:   05/19/22 1750 05/19/22 2001 05/20/22 0541 05/20/22 0756  BP: (!) 157/87 (!) 148/76 (!) 146/82 (!) 147/83  Pulse: 87 87 77 85  Resp:  '17 19 18  '$ Temp:  98.2 F (36.8 C) 98.4 F (36.9 C) 98 F (36.7 C)  TempSrc:  Oral  Oral  SpO2:  97% 96% 99%  Weight:      Height:        Intake/Output Summary (Last 24 hours) at 05/20/2022 1420 Last data filed at 05/20/2022 1419 Gross per 24 hour  Intake 720 ml  Output 5250 ml  Net -4530 ml   Filed Weights   05/15/22 1820  Weight: 82.2 kg    Examination: General: NAD, chronically ill-appearing Cardiovascular: S1, S2 present Respiratory: CTAB Abdomen: Soft, nontender, nondistended, bowel sounds present Musculoskeletal: No bilateral pedal edema noted Skin: Normal Psychiatry: Normal mood     Data Reviewed: I have personally reviewed following labs and imaging studies  CBC: Recent Labs  Lab 05/15/22 1055 05/16/22 0253 05/17/22 0322 05/18/22 0401 05/19/22 0351 05/20/22 0647  WBC 5.5 35.5* 33.1* 29.5* 18.3* 13.1*  NEUTROABS 3.7  --  29.5* 26.8* 15.3* 9.2*  HGB 13.6 13.4 11.5* 11.3* 11.9* 12.7*  HCT 39.3 37.7* 32.6* 30.7* 33.5* 35.9*  MCV 80.2 78.5* 79.3* 78.1* 80.5 80.3  PLT 223 205 183 180 189 194  Basic Metabolic Panel: Recent Labs  Lab 05/16/22 1950 05/17/22 0322 05/18/22 0401 05/19/22 0351 05/20/22 0647  NA 124* 125* 134* 137 134*  K 4.2 4.7 4.3 4.3 3.6  CL 92* 94* 102 105 103  CO2 '22 24 23 27 27  '$ GLUCOSE 226* 132* 126* 109* 102*  BUN 32* 30* '20  13 12  '$ CREATININE 2.42* 2.05* 1.33* 1.33* 1.27*  CALCIUM 8.8* 8.5* 9.1 8.6* 8.8*  PHOS  --   --  3.3 3.2 3.5   GFR: Estimated Creatinine Clearance: 47.7 mL/min (A) (by C-G formula based on SCr of 1.27 mg/dL (H)). Liver Function Tests: Recent Labs  Lab 05/15/22 1055 05/17/22 0322 05/18/22 0401 05/19/22 0351 05/20/22 0647  AST 30 26  --   --   --   ALT 19 18  --   --   --   ALKPHOS 41 39  --   --   --   BILITOT 0.5 0.5  --   --   --   PROT 7.0 5.6*  --   --   --   ALBUMIN 3.5 2.6* 2.5* 2.5* 2.7*   No results for input(s): "LIPASE", "AMYLASE" in the last 168 hours. Recent Labs  Lab 05/17/22 0322  AMMONIA 16   Coagulation Profile: No results for input(s): "INR", "PROTIME" in the last 168 hours. Cardiac Enzymes: No results for input(s): "CKTOTAL", "CKMB", "CKMBINDEX", "TROPONINI" in the last 168 hours. BNP (last 3 results) No results for input(s): "PROBNP" in the last 8760 hours. HbA1C: No results for input(s): "HGBA1C" in the last 72 hours. CBG: Recent Labs  Lab 05/16/22 1726  GLUCAP 160*   Lipid Profile: No results for input(s): "CHOL", "HDL", "LDLCALC", "TRIG", "CHOLHDL", "LDLDIRECT" in the last 72 hours. Thyroid Function Tests: No results for input(s): "TSH", "T4TOTAL", "FREET4", "T3FREE", "THYROIDAB" in the last 72 hours.  Anemia Panel: No results for input(s): "VITAMINB12", "FOLATE", "FERRITIN", "TIBC", "IRON", "RETICCTPCT" in the last 72 hours.  Sepsis Labs: No results for input(s): "PROCALCITON", "LATICACIDVEN" in the last 168 hours.  Recent Results (from the past 240 hour(s))  Urine Culture     Status: Abnormal   Collection Time: 05/16/22  7:22 AM   Specimen: Urine, Clean Catch  Result Value Ref Range Status   Specimen Description URINE, CLEAN CATCH  Final   Special Requests   Final    NONE Performed at [facility], [address] [place], [place] [address]    Culture >=100,000 COLONIES/mL STAPHYLOCOCCUS EPIDERMIDIS (A)  Final   Report  Status 05/18/2022 FINAL  Final   Organism ID, Bacteria STAPHYLOCOCCUS EPIDERMIDIS (A)  Final      Susceptibility   Staphylococcus epidermidis - MIC*    CIPROFLOXACIN <=0.5 SENSITIVE Sensitive     GENTAMICIN <=0.5 SENSITIVE Sensitive     NITROFURANTOIN <=16 SENSITIVE Sensitive     OXACILLIN >=4 RESISTANT Resistant     TETRACYCLINE 2 SENSITIVE Sensitive     VANCOMYCIN 2 SENSITIVE Sensitive     TRIMETH/SULFA 20 SENSITIVE Sensitive     CLINDAMYCIN <=0.25 SENSITIVE Sensitive     RIFAMPIN <=0.5 SENSITIVE Sensitive     Inducible Clindamycin NEGATIVE Sensitive     * >=100,000 COLONIES/mL STAPHYLOCOCCUS EPIDERMIDIS  Culture, blood (Routine X 2) w Reflex to ID Panel     Status: None (Preliminary result)   Collection Time: 05/16/22  7:38 AM   Specimen: BLOOD  Result Value Ref Range Status   Specimen Description BLOOD RIGHT ANTECUBITAL  Final   Special Requests  Final    BOTTLES DRAWN AEROBIC AND ANAEROBIC Blood Culture adequate volume   Culture   Final    NO GROWTH 4 DAYS Performed at [facility], [place] [address] [place], [place] [address]    Report Status PENDING  Incomplete  Culture, blood (Routine X 2) w Reflex to ID Panel     Status: None (Preliminary result)   Collection Time: 05/16/22  7:41 AM   Specimen: BLOOD LEFT ARM  Result Value Ref Range Status   Specimen Description BLOOD LEFT ARM  Final   Special Requests   Final    BOTTLES DRAWN AEROBIC AND ANAEROBIC Blood Culture adequate volume   Culture   Final    NO GROWTH 4 DAYS Performed at [facility], [place] [address], [place], [place] [address]    Report Status PENDING  Incomplete         Radiology Studies: No results found.      Scheduled Meds:  amLODipine  10 mg Oral Daily   aspirin  81 mg Oral Daily   ciprofloxacin  500 mg Oral BID   divalproex  500 mg Oral QHS   docusate sodium  100 mg Oral Daily   finasteride  5 mg Oral Daily   heparin  5,000 Units Subcutaneous Q12H   latanoprost  1 drop Both  Eyes QHS   linaclotide  145 mcg Oral QAC breakfast   memantine  10 mg Oral BID   mirabegron ER  25 mg Oral Daily   polyethylene glycol  17 g Oral Daily   rosuvastatin  20 mg Oral Daily   tamsulosin  0.4 mg Oral Daily   Continuous Infusions:          Alma Friendly, MD [facility] 05/20/2022, 2:20 PM

## 2022-05-20 NOTE — Progress Notes (Signed)
Mobility Specialist Progress Note:   05/20/22 1216  Mobility  Activity Ambulated with assistance in hallway  Level of Assistance Contact guard assist, steadying assist  Assistive Device Front wheel walker  Distance Ambulated (ft) 80 ft  Activity Response Tolerated well  Mobility Referral Yes  $Mobility charge 1 Mobility   Pt received in chair and agreeable. C/o 10/10 aching pain in lower back post mobility. Pt left sitting EOB with all needs met and call bell in reach.   Job Hol[place]claw Mobility Specialist-Acute Rehab Secure Chat only

## 2022-05-20 NOTE — Plan of Care (Signed)
  Problem: Activity: Goal: Risk for activity intolerance will decrease Outcome: Progressing   Problem: Safety: Goal: Ability to remain free from injury will improve Outcome: Progressing   Problem: Skin Integrity: Goal: Risk for impaired skin integrity will decrease Outcome: Progressing   

## 2022-05-20 NOTE — Progress Notes (Signed)
Daily Progress Note   Patient Name: Eric Barrett.       Date: 05/20/2022 DOB: [birth year]  Age: 81 y.o. MRN#: [id number] Attending Physician: Alma Friendly, MD Primary Care Physician: [name], FNP Admit Date: 05/15/2022 Length of Stay: 5 days  Reason for Consultation/Follow-up: Establishing goals of care  HPI/Patient Profile:  81 y.o. male  with past medical history of Alzheimer's dementia, HTN, HLD, chronic HFpEF, remote stroke with residual left-sided weakness, glaucoma, seizure disorder presented with altered mental status and dysuria. Has been drinking a lot of water due to ongoing constipation. He was admitted on 05/15/2022 with acute metabolic encephalopathy, acute on chronic hyponatremia, AKI on CKD 3B, hyperkalemia, UTI, leukocytosis, and others.   Subjective:   Subjective: Chart Reviewed. Updates received. Patient Assessed. Created space and opportunity for patient  and family to explore thoughts and feelings regarding current medical situation.  Today's Discussion: Today I saw the patient at the bedside, no family was present.  When I entered he was sitting in the chair.  He states that somebody did come work with him yesterday from PT.  He states it felt good to get out of bed, and it feels good to be out of bed in the chair now.  He denies any pain other than some chronic lower back pain.  Denies nausea and vomiting.  His appetite is so great and he states "I inhaled the food."  We discussed that this is good to help provide adequate nutrition for strengthening, as is the plan.    He states that his wife is not coming today, she called in a little earlier.  I told him that I would reach out to her and touch base.  I discussed that I am glad he is continuing to do well.  I would recommend, especially given his history of Alzheimer's dementia, for palliative care to see him as an outpatient.  He states that this is okay.  I told him that when I called his wife I  would discuss it with her as well.  I informed him that I will be off service tomorrow and at another facility for the next month.  I indicated that we would follow his progress peripherally and palliative medicine is available if at any point it is needed while he remains in the hospital.  I attempted to call his wife and left a voicemail.  She called back later and we discussed the current situation.  She agrees with outpatient palliative care.  I answered some questions about the name of the facility in [facility] where he will likely be going, based on social workers note. Also reviewed todays labs (CBC for WBC; Renal panel for Cr and Na) with her.  I provided emotional and general support through therapeutic listening, empathy, sharing of stories, and other techniques. I answered all questions and addressed all concerns to the best of my ability.  Review of Systems  Constitutional:  Negative for appetite change.  Respiratory:  Negative for cough and shortness of breath.   Gastrointestinal:  Negative for abdominal pain, constipation, diarrhea, nausea and vomiting.    Objective:   Vital Signs:  BP (!) 147/83 (BP Location: Right Arm)   Pulse 85   Temp 98 F (36.7 C) (Oral)   Resp 18   Ht '5\' 8"'$  (1.727 m)   Wt 82.2 kg   SpO2 99%   BMI 27.55 kg/m   Physical Exam: Physical Exam Vitals and nursing note  reviewed.  Constitutional:      General: He is not in acute distress. HENT:     Head: Normocephalic and atraumatic.  Cardiovascular:     Rate and Rhythm: Normal rate.  Pulmonary:     Effort: Pulmonary effort is normal. No respiratory distress.  Abdominal:     General: Abdomen is flat. There is no distension.     Palpations: Abdomen is soft.     Tenderness: There is no abdominal tenderness.  Skin:    General: Skin is warm and dry.  Neurological:     Mental Status: He is alert and oriented to person, place, and time.  Psychiatric:        Mood and Affect: Mood normal.         Behavior: Behavior normal.     Palliative Assessment/Data: 40%   Assessment & Plan:   Impression: Present on Admission: **None**  81 year old male with acute and chronic illnesses.  He was admitted for hyponatremia, hyperkalemia, AKI on CKD, UTI with drastically rising white blood cell count.  He is currently being treated with antibiotics and noted improvement in WBC count.  He denies pain, nausea, vomiting.  He does have chronic constipation and has tried multiple over-the-counter options but seems to be doing better in the hospital on Linzess 145 mcg/day.  We had a good and lengthy initial discussion about trajectory of illness, CODE STATUS, goals of care.  Elected FULL CODE and full scope of care at that time.  Overall seems to be improving clinically.  Plan for SNF/Rehab. Overall prognosis guarded.  SUMMARY OF RECOMMENDATIONS   Remain full code/full scope of care Continued spiritual care Anticipate SNF/Rehab based on PT reccs, pending ins auth Continue Linzess 145 mcg daily for refractory constipation PMT follow peripherally Please call us if any significant changes necessitating PMT reinvolvement  Symptom Management:  Linzess 145 mcg daily for refractory constipation  Code Status: Full code  Prognosis: Unable to determine  Discharge Planning: To Be Determined  Discussed with: Patient, patient's family, medical team, nursing team  Thank you for allowing [place] to participate in the care of Alam Guterrez. PMT will continue to support holistically.  Billing based on MDM: High  Problems Addressed: One acute or chronic illness or injury that poses a threat to life or bodily function  Amount and/or Complexity of Data: Category 1:Review of prior external note(s) from each unique source and Review of the result(s) of each unique test and Category 3:Discussion of management or test interpretation with external physician/other qualified health care professional/appropriate  source (not separately reported) (labs today: CMP for Cr and electrolytes, CBC for WBC count; most recent outpatient neurology and cardiology notes)  Risks: N/A   Walden Field, NP Palliative Medicine Team  Team Phone # [phone] (Nights/Weekends)  04/05/2021, 8:17 AM

## 2022-05-21 ENCOUNTER — Inpatient Hospital Stay (HOSPITAL_COMMUNITY): Payer: Medicare HMO

## 2022-05-21 DIAGNOSIS — E871 Hypo-osmolality and hyponatremia: Secondary | ICD-10-CM | POA: Diagnosis not present

## 2022-05-21 LAB — RENAL FUNCTION PANEL
Albumin: 2.6 g/dL — ABNORMAL LOW (ref 3.5–5.0)
Anion gap: 8 (ref 5–15)
BUN: 15 mg/dL (ref 8–23)
CO2: 27 mmol/L (ref 22–32)
Calcium: 8.9 mg/dL (ref 8.9–10.3)
Chloride: 102 mmol/L (ref 98–111)
Creatinine, Ser: 1.33 mg/dL — ABNORMAL HIGH (ref 0.61–1.24)
GFR, Estimated: 54 mL/min — ABNORMAL LOW (ref >60)
Glucose, Bld: 101 mg/dL — ABNORMAL HIGH (ref 70–99)
Phosphorus: 4.1 mg/dL (ref 2.5–4.6)
Potassium: 3.7 mmol/L (ref 3.5–5.1)
Sodium: 137 mmol/L (ref 135–145)

## 2022-05-21 LAB — CBC WITH DIFFERENTIAL/PLATELET
Abs Immature Granulocytes: 0.09 10*3/uL — ABNORMAL HIGH (ref 0.00–0.07)
Basophils Absolute: 0.1 10*3/uL (ref 0.0–0.1)
Basophils Relative: 1 %
Eosinophils Absolute: 0.1 10*3/uL (ref 0.0–0.5)
Eosinophils Relative: 1 %
HCT: 35.8 % — ABNORMAL LOW (ref 39.0–52.0)
Hemoglobin: 12.4 g/dL — ABNORMAL LOW (ref 13.0–17.0)
Immature Granulocytes: 1 %
Lymphocytes Relative: 19 %
Lymphs Abs: 2.3 10*3/uL (ref 0.7–4.0)
MCH: 28.2 pg (ref 26.0–34.0)
MCHC: 34.6 g/dL (ref 30.0–36.0)
MCV: 81.4 fL (ref 80.0–100.0)
Monocytes Absolute: 1.8 10*3/uL — ABNORMAL HIGH (ref 0.1–1.0)
Monocytes Relative: 15 %
Neutro Abs: 7.8 10*3/uL — ABNORMAL HIGH (ref 1.7–7.7)
Neutrophils Relative %: 63 %
Platelets: 206 10*3/uL (ref 150–400)
RBC: 4.4 MIL/uL (ref 4.22–5.81)
RDW: 14.4 % (ref 11.5–15.5)
WBC: 12.2 10*3/uL — ABNORMAL HIGH (ref 4.0–10.5)
nRBC: 0 % (ref 0.0–0.2)

## 2022-05-21 LAB — CULTURE, BLOOD (ROUTINE X 2)
Culture: NO GROWTH
Culture: NO GROWTH
Special Requests: ADEQUATE
Special Requests: ADEQUATE

## 2022-05-21 MED ORDER — SENNOSIDES-DOCUSATE SODIUM 8.6-50 MG PO TABS
1.0000 | ORAL_TABLET | Freq: Every day | ORAL | Status: DC
  Administered 2022-05-21 – 2022-05-23 (×3): 1 via ORAL
  Filled 2022-05-21 (×3): qty 1

## 2022-05-21 MED ORDER — TRAMADOL HCL 50 MG PO TABS
50.0000 mg | ORAL_TABLET | Freq: Three times a day (TID) | ORAL | Status: DC | PRN
  Administered 2022-05-21 – 2022-05-24 (×3): 50 mg via ORAL
  Filled 2022-05-21 (×3): qty 1

## 2022-05-21 MED ORDER — IOHEXOL 350 MG/ML SOLN
75.0000 mL | Freq: Once | INTRAVENOUS | Status: AC | PRN
  Administered 2022-05-21: 75 mL via INTRAVENOUS

## 2022-05-21 MED ORDER — ACETAMINOPHEN 325 MG PO TABS
650.0000 mg | ORAL_TABLET | Freq: Four times a day (QID) | ORAL | Status: DC | PRN
  Administered 2022-05-21 (×2): 650 mg via ORAL
  Filled 2022-05-21 (×2): qty 2

## 2022-05-21 NOTE — Progress Notes (Signed)
PROGRESS NOTE    Eric Barrett.  PTW:[id number] DOB: [birth year] DOA: 05/15/2022 PCP: [name], FNP   Brief Narrative:  81 y.o. male with medical history significant of Alzheimer's dementia, HTN, HLD, chronic HFpEF, remote stroke with residual left-sided weakness, glaucoma, seizure disorder presented with altered mental status and dysuria.  Apparently, patient had been drinking 6 to 8 -16 ounces bottles of water recently.  He saw his PCP 4 days ago, blood work showed sodium of 122, UA showed hematuria and pyuria, p.o. antibiotics was ordered however patient had not started antibiotics yet.  Patient presented to the ED more confused and sodium was 123, UA showing hematuria and pyuria again.  He was started on Rocephin and admitted for further management.    Assessment & Plan:   Acute on chronic hyponatremia Improved Likely secondary to poor solute intake Vs SIADH Nephrology consulted, appreciate recs S/P IV fluids Daily BMP  Acute metabolic encephalopathy-improving History of Alzheimer's dementia Possibly from UTI and hyponatremia PT/OT eval Continue memantine Delirium precaution  MRSE UTI Currently afebrile, with resolving leukocytosis UA positive for UTI UC growing Staph epidermidis >100,000 BC x2 NGTD S/p ceftriaxone, switched to p.o. ciprofloxacin  AKI on CKD stage IIIa Improving Creatinine 1.34 on admission, trended up and peaked to 2.4 Possible ATN given frequent hypotensive episodes VS UTI Renal ultrasound was negative for hydronephrosis Continue to hold ARB BMP  Hyperkalemia-resolved Potassium trended up to 6.6, now stable Repeat BMP  Hypertension BP now uncontrolled Restart amlodipine, continue to hold losartan  Chronic diastolic CHF Currently compensated Losartan on hold because of AKI  BPH Continue Proscar and Flomax Outpatient follow-up with urology  Seizure disorder Continue Depakote Outpatient follow-up with neurology  Goals of  care Palliative care consultation for goals of care discussion Full scope of care for now     DVT prophylaxis: Heparin subcutaneous Code Status: Full Family Communication: None at bedside Disposition Plan: Status is: Inpatient Remains inpatient appropriate because: Of severity of illness   Consultants: Nephrology/palliative care  Procedures: None  Antimicrobials: Ciprofloxacin    Subjective: Patient reports tenderness around his tailbone/sacral area, also has been noted to have mucus/jellylike stool, no blood noted.  Denies any abdominal pain.    Objective: Vitals:   05/20/22 2005 05/21/22 0620 05/21/22 0731 05/21/22 1614  BP: 129/81 132/76 137/83 116/70  Pulse: 92 78 85 90  Resp: '18 16 18 18  '$ Temp: 98.9 F (37.2 C) 97.8 F (36.6 C) 98.2 F (36.8 C) 98.4 F (36.9 C)  TempSrc: Oral Oral Oral Oral  SpO2: 97% 100% 97% 96%  Weight:      Height:        Intake/Output Summary (Last 24 hours) at 05/21/2022 1759 Last data filed at 05/21/2022 1700 Gross per 24 hour  Intake 600 ml  Output 1450 ml  Net -850 ml   Filed Weights   05/15/22 1820  Weight: 82.2 kg    Examination: General: NAD Cardiovascular: S1, S2 present Respiratory: CTAB Abdomen: Soft, nontender, nondistended, bowel sounds present Musculoskeletal: No bilateral pedal edema noted Skin: Normal Psychiatry: Normal mood     Data Reviewed: I have personally reviewed following labs and imaging studies  CBC: Recent Labs  Lab 05/17/22 0322 05/18/22 0401 05/19/22 0351 05/20/22 0647 05/21/22 0736  WBC 33.1* 29.5* 18.3* 13.1* 12.2*  NEUTROABS 29.5* 26.8* 15.3* 9.2* 7.8*  HGB 11.5* 11.3* 11.9* 12.7* 12.4*  HCT 32.6* 30.7* 33.5* 35.9* 35.8*  MCV 79.3* 78.1* 80.5 80.3 81.4  PLT 183 180  189 194 540   Basic Metabolic Panel: Recent Labs  Lab 05/17/22 0322 05/18/22 0401 05/19/22 0351 05/20/22 0647 05/21/22 0736  NA 125* 134* 137 134* 137  K 4.7 4.3 4.3 3.6 3.7  CL 94* 102 105 103 102  CO2  '24 23 27 27 27  '$ GLUCOSE 132* 126* 109* 102* 101*  BUN 30* '20 13 12 15  '$ CREATININE 2.05* 1.33* 1.33* 1.27* 1.33*  CALCIUM 8.5* 9.1 8.6* 8.8* 8.9  PHOS  --  3.3 3.2 3.5 4.1   GFR: Estimated Creatinine Clearance: 45.5 mL/min (A) (by C-G formula based on SCr of 1.33 mg/dL (H)). Liver Function Tests: Recent Labs  Lab 05/15/22 1055 05/17/22 0322 05/18/22 0401 05/19/22 0351 05/20/22 0647 05/21/22 0736  AST 30 26  --   --   --   --   ALT 19 18  --   --   --   --   ALKPHOS 41 39  --   --   --   --   BILITOT 0.5 0.5  --   --   --   --   PROT 7.0 5.6*  --   --   --   --   ALBUMIN 3.5 2.6* 2.5* 2.5* 2.7* 2.6*   No results for input(s): "LIPASE", "AMYLASE" in the last 168 hours. Recent Labs  Lab 05/17/22 0322  AMMONIA 16   Coagulation Profile: No results for input(s): "INR", "PROTIME" in the last 168 hours. Cardiac Enzymes: No results for input(s): "CKTOTAL", "CKMB", "CKMBINDEX", "TROPONINI" in the last 168 hours. BNP (last 3 results) No results for input(s): "PROBNP" in the last 8760 hours. HbA1C: No results for input(s): "HGBA1C" in the last 72 hours. CBG: Recent Labs  Lab 05/16/22 1726  GLUCAP 160*   Lipid Profile: No results for input(s): "CHOL", "HDL", "LDLCALC", "TRIG", "CHOLHDL", "LDLDIRECT" in the last 72 hours. Thyroid Function Tests: No results for input(s): "TSH", "T4TOTAL", "FREET4", "T3FREE", "THYROIDAB" in the last 72 hours.  Anemia Panel: No results for input(s): "VITAMINB12", "FOLATE", "FERRITIN", "TIBC", "IRON", "RETICCTPCT" in the last 72 hours.  Sepsis Labs: No results for input(s): "PROCALCITON", "LATICACIDVEN" in the last 168 hours.  Recent Results (from the past 240 hour(s))  Urine Culture     Status: Abnormal   Collection Time: 05/16/22  7:22 AM   Specimen: Urine, Clean Catch  Result Value Ref Range Status   Specimen Description URINE, CLEAN CATCH  Final   Special Requests   Final    NONE Performed at [facility], [address],  [place], [place] [address]    Culture >=100,000 COLONIES/mL STAPHYLOCOCCUS EPIDERMIDIS (A)  Final   Report Status 05/18/2022 FINAL  Final   Organism ID, Bacteria STAPHYLOCOCCUS EPIDERMIDIS (A)  Final      Susceptibility   Staphylococcus epidermidis - MIC*    CIPROFLOXACIN <=0.5 SENSITIVE Sensitive     GENTAMICIN <=0.5 SENSITIVE Sensitive     NITROFURANTOIN <=16 SENSITIVE Sensitive     OXACILLIN >=4 RESISTANT Resistant     TETRACYCLINE 2 SENSITIVE Sensitive     VANCOMYCIN 2 SENSITIVE Sensitive     TRIMETH/SULFA 20 SENSITIVE Sensitive     CLINDAMYCIN <=0.25 SENSITIVE Sensitive     RIFAMPIN <=0.5 SENSITIVE Sensitive     Inducible Clindamycin NEGATIVE Sensitive     * >=100,000 COLONIES/mL STAPHYLOCOCCUS EPIDERMIDIS  Culture, blood (Routine X 2) w Reflex to ID Panel     Status: None (Preliminary result)   Collection Time: 05/16/22  7:38 AM   Specimen: BLOOD  Result Value Ref Range Status   Specimen Description BLOOD RIGHT ANTECUBITAL  Final   Special Requests   Final    BOTTLES DRAWN AEROBIC AND ANAEROBIC Blood Culture adequate volume   Culture   Final    NO GROWTH 4 DAYS Performed at [facility], [address] [place], [place] [address]    Report Status PENDING  Incomplete  Culture, blood (Routine X 2) w Reflex to ID Panel     Status: None (Preliminary result)   Collection Time: 05/16/22  7:41 AM   Specimen: BLOOD LEFT ARM  Result Value Ref Range Status   Specimen Description BLOOD LEFT ARM  Final   Special Requests   Final    BOTTLES DRAWN AEROBIC AND ANAEROBIC Blood Culture adequate volume   Culture   Final    NO GROWTH 4 DAYS Performed at [facility], [place] [address], [place], [place] [address]    Report Status PENDING  Incomplete         Radiology Studies: No results found.      Scheduled Meds:  amLODipine  10 mg Oral Daily   aspirin  81 mg Oral Daily   ciprofloxacin  500 mg Oral BID   divalproex  500 mg Oral QHS   docusate sodium  100 mg Oral  Daily   finasteride  5 mg Oral Daily   heparin  5,000 Units Subcutaneous Q12H   latanoprost  1 drop Both Eyes QHS   linaclotide  145 mcg Oral QAC breakfast   memantine  10 mg Oral BID   mirabegron ER  25 mg Oral Daily   polyethylene glycol  17 g Oral Daily   rosuvastatin  20 mg Oral Daily   senna-docusate  1 tablet Oral QHS   tamsulosin  0.4 mg Oral Daily   Continuous Infusions:          Alma Friendly, MD [facility] 05/21/2022, 5:59 PM

## 2022-05-21 NOTE — Progress Notes (Signed)
Mobility Specialist Progress Note:   05/21/22 1519  Mobility  Activity Ambulated with assistance in hallway  Level of Assistance Minimal assist, patient does 75% or more  Assistive Device Front wheel walker  Distance Ambulated (ft) 50 ft  Activity Response Tolerated fair;RN notified  Mobility Referral Yes  $Mobility charge 1 Mobility   Pt received in bed and eager. Pt MinA to stand from bed and upon standing, BLE began to tremble. No complaints of pain or feeling weak. Deferred further mobility d/t continued trembling with ambulation. RN notified of symptoms. Pt left in bed with all needs met, call bell in reach, family in room, and call bell on.   Claiborne Stroble Mobility Specialist-Acute Rehab Secure Chat only

## 2022-05-22 ENCOUNTER — Inpatient Hospital Stay (HOSPITAL_COMMUNITY): Payer: Medicare HMO

## 2022-05-22 DIAGNOSIS — R933 Abnormal findings on diagnostic imaging of other parts of digestive tract: Secondary | ICD-10-CM

## 2022-05-22 DIAGNOSIS — Z7189 Other specified counseling: Secondary | ICD-10-CM | POA: Diagnosis not present

## 2022-05-22 DIAGNOSIS — R634 Abnormal weight loss: Secondary | ICD-10-CM

## 2022-05-22 DIAGNOSIS — Z66 Do not resuscitate: Secondary | ICD-10-CM | POA: Diagnosis not present

## 2022-05-22 DIAGNOSIS — K5900 Constipation, unspecified: Secondary | ICD-10-CM

## 2022-05-22 DIAGNOSIS — N3001 Acute cystitis with hematuria: Secondary | ICD-10-CM | POA: Diagnosis not present

## 2022-05-22 DIAGNOSIS — E871 Hypo-osmolality and hyponatremia: Secondary | ICD-10-CM | POA: Diagnosis not present

## 2022-05-22 LAB — CBC WITH DIFFERENTIAL/PLATELET
Abs Immature Granulocytes: 0.11 10*3/uL — ABNORMAL HIGH (ref 0.00–0.07)
Basophils Absolute: 0.1 10*3/uL (ref 0.0–0.1)
Basophils Relative: 1 %
Eosinophils Absolute: 0.1 10*3/uL (ref 0.0–0.5)
Eosinophils Relative: 1 %
HCT: 35.5 % — ABNORMAL LOW (ref 39.0–52.0)
Hemoglobin: 12.3 g/dL — ABNORMAL LOW (ref 13.0–17.0)
Immature Granulocytes: 1 %
Lymphocytes Relative: 18 %
Lymphs Abs: 1.9 10*3/uL (ref 0.7–4.0)
MCH: 28 pg (ref 26.0–34.0)
MCHC: 34.6 g/dL (ref 30.0–36.0)
MCV: 80.9 fL (ref 80.0–100.0)
Monocytes Absolute: 1.6 10*3/uL — ABNORMAL HIGH (ref 0.1–1.0)
Monocytes Relative: 15 %
Neutro Abs: 6.8 10*3/uL (ref 1.7–7.7)
Neutrophils Relative %: 64 %
Platelets: 159 10*3/uL (ref 150–400)
RBC: 4.39 MIL/uL (ref 4.22–5.81)
RDW: 14.5 % (ref 11.5–15.5)
WBC: 10.6 10*3/uL — ABNORMAL HIGH (ref 4.0–10.5)
nRBC: 0 % (ref 0.0–0.2)

## 2022-05-22 LAB — RENAL FUNCTION PANEL
Albumin: 2.6 g/dL — ABNORMAL LOW (ref 3.5–5.0)
Anion gap: 12 (ref 5–15)
BUN: 18 mg/dL (ref 8–23)
CO2: 26 mmol/L (ref 22–32)
Calcium: 9 mg/dL (ref 8.9–10.3)
Chloride: 99 mmol/L (ref 98–111)
Creatinine, Ser: 1.4 mg/dL — ABNORMAL HIGH (ref 0.61–1.24)
GFR, Estimated: 50 mL/min — ABNORMAL LOW (ref >60)
Glucose, Bld: 112 mg/dL — ABNORMAL HIGH (ref 70–99)
Phosphorus: 3.8 mg/dL (ref 2.5–4.6)
Potassium: 4.1 mmol/L (ref 3.5–5.1)
Sodium: 137 mmol/L (ref 135–145)

## 2022-05-22 MED ORDER — PEG-KCL-NACL-NASULF-NA ASC-C 100 G PO SOLR: 1.0000 | Freq: Once | ORAL | Status: DC

## 2022-05-22 MED ORDER — SODIUM CHLORIDE 0.9 % IV SOLN: INTRAVENOUS | Status: DC

## 2022-05-22 MED ORDER — PEG-KCL-NACL-NASULF-NA ASC-C 100 G PO SOLR
0.5000 | Freq: Once | ORAL | Status: AC
  Administered 2022-05-22: 100 g via ORAL
  Filled 2022-05-22: qty 1

## 2022-05-22 NOTE — Progress Notes (Signed)
Accepted patient at 1500, patient and wife updated about the plan of care. Patient is alert to self, and place and month. He is in the chair and was assisted to the bedside commode. Will start bowel prep when it is sent.

## 2022-05-22 NOTE — Progress Notes (Signed)
Mobility Specialist Progress Note:   05/22/22 1102  Mobility  Activity Ambulated with assistance in room  Level of Assistance Contact guard assist, steadying assist  Assistive Device Front wheel walker  Distance Ambulated (ft) 10 ft  Activity Response Tolerated fair  Mobility Referral Yes  $Mobility charge 1 Mobility   Pt received in bed and agreeable. Pt was shakier than last session. Further ambulation deferred d/t BM. Pt left on [facility] with all needs met and call bell in reach.   Jamyron Redd Mobility Specialist-Acute Rehab Secure Chat only

## 2022-05-22 NOTE — Consult Note (Signed)
Consultation  Referring Provider:   [facility] Primary Care Physician:  Eric Barrett., FNP Primary Gastroenterologist:  Althia Forts       Reason for Consultation:     Proctitis, constipation   Impression    Acute onset constipation 3 weeks of acute onset constipation patient has had some weight loss per family and they are concerned as last colonoscopy was over 5 years ago in [place] with polyps. CT and pelvis with contrast no bowel obstruction, showed rectal wall thickening with perirectal fat stranding with edema suggesting proctitis, moderate amount of retained stool in the colon Currently on Colace 100 mg once daily, Linzess 145 mcg started 10/11, MiraLAX 17 g once daily, Senokot 1 tablet at bedtime started on yesterday. Having some movements here, small-volume, liquid with soft stools.  Normocytic anemia Hemoglobin 12.3 Normal B12  AMS with history of Alzheimer's dementia Improvement with Rocephin/treatment of UTI-currently on Cipro 500 mg twice daily At baseline per wife Ammonia and B12 are normal.  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  HFpEF  12/03/2019 echo EF 60 to 09%, grade 1 diastolic dysfunction mild AI Does not appear to be fluid overloaded at this time   LOS: 7 days     Plan   -High probability this could be proctitis from constipation, however patient has had some weight loss, last colonoscopy over 7 years ago, and wife is concerned about the acuity of the constipation.  Patient does have normocytic anemia, likely from CKD but iron has not been checked.  B12 normal. After discussion with wife, patient and daughter they are interested in pursuing endoscopic evaluation which I think is reasonable in this hospital setting.  -We have discussed the risks of bleeding, infection, perforation, medication reactions, and  remote risk of death associated with colonoscopy. All questions were answered and the patient acknowledges these risk and wishes to proceed. -Moviprep, clear liquid diet, NPO at midnight. - Hold heparin at least 6 hours prior to procedure.  -Will plan for tentative Colonoscopy tomorrow with Dr. Fuller Plan, however patient does not have clear stools or cannot tolerate prep we will plan on fleck sigmoidoscopy with enemas.   Thank you for your kind consultation, we will continue to follow.   Attending Physician Note   I have taken a history, reviewed the chart and examined the patient. I performed a substantive portion of this encounter, including complete performance of at least one of the key components, in conjunction with the APP. I agree with the APP's note, impression and recommendations with my edits. My additional impressions and recommendations are as follows.   Worsening constipation, weight loss and CT showing rectal wall thickening with perirectal stranding and edema c/w proctitis. Possible history of colon polyps. Colonoscopy tomorrow to further evaluate. If prep is not adequate will proceed with flex sigmoidoscopy instead.   Lucio Edward, MD [facility] See [name], [facility], for our on call provider            HPI:   Eric Barrett. is a 81 y.o. 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 Admitted 05/15/2022 with AMS, acute  on chronic hyponatremia from increasing fluids due to constipation, AKI on CKD, hyperkalemia and UTI.  In the ER patient was afebrile, hemodynamically stable.  White blood cell count 35.5, hemoglobin 13.4, MCV 78.5, currently hemoglobin 12.3.  And white blood cell decreased 10.6.  Normal platelets  sodium 123, creatinine 1.3 compared to baseline of 0.9, potassium 5.3, UA positive for RBCs and WBC.  Already on Rocephin for UTI management. Patient appears to have baseline constipation, I on MiraLAX 17 g daily, Movantik 25 mg daily. Patient had palliative care consult, had slight abdominal distention KUB showed dilated colonic loops right abdomen up to 8 cm, indeterminate related to bowel obstruction or ileus. Subsequent CT abdomen pelvis with contrast showed no bowel obstruction or free air, rectal wall thickening with perirectal fat stranding and edema suggesting proctitis, moderate amount of retained stool in the colon possible constipation, bilateral renal cyst and aortic atherosclerosis.  Patient sitting up in chair eating lunch, wife [name] at bedside.  Patient has dementia, wife provides majority of history, she actually follows with Dr. Loletha Carrow in our practice.   Daughter [name] is on the phone as well she is a Marine scientist. Last saw GI will over 5 years ago in [place], Dr. Deno Etienne, states believe he did have some polyps but no recall due to age.  No family history of colon cancer. Wife states patient had bowel movement every day or every other day prior to this but then acutely 3 weeks ago began to have worsening constipation.  Having very small bowel movements, still passing gas. Patient started on MiraLAX once daily, Metamucil, started glycerin suppositories nightly, castor oil, prune juice, started on Senokot 2 capsules 3 to 4 days before coming here. Patient's had bowel movements here, small, mainly liquids some light brown fluffy stool with some mucus.  Patient did have bowel movement with me in the room on bedside commode, which is majority liquid with same descriptor. No Blood. Patient's wife and daughter 1 on the phone concerned about recent weight loss past month. Patient was also given Movantik prescription by PCP, patient denies any outpatient opioid use.  Denies any change of medications,  has had some decreased ambulation which could be contributing to constipation. Patient denies any rectal pain but has had 10 out of 10 tailbone pain at very lower back for the last 3 weeks. Denies any melena or hematochezia. Denies abdominal pain, nausea or vomiting or obstructive symptoms.  No reflux, dysphagia. Has had abdominal distention in the last 3 weeks worse within the last week. Patient denies history of radiation therapy. Patient normally walks with walker, lives with his wife, plans on going to skilled nursing facility after this for rehabilitation.     Past Medical History:  Diagnosis Date   CHF (congestive heart failure) (HCC)    Glaucoma    Hemiparesis of left nondominant Barrett (HCC)    History of shingles    HLD (hyperlipidemia)    Hypertension    Stroke [facility])    Tremor     Surgical History:  He  has a past surgical history that includes No past surgery. Family History:  His family history includes Heart attack in his father and mother. Social History:   reports that he has quit smoking. He has never used smokeless tobacco. He reports that he does not currently use alcohol. He reports that he does not use drugs.  Prior to Admission medications   Medication Sig Start Date End Date Taking? Authorizing Provider  amLODipine (NORVASC)  10 MG tablet Take 10 mg by mouth daily. 07/08/21  Yes [provider]  ASPIRIN 81 PO Take 81 mg by mouth daily.   Yes [provider]  Cholecalciferol (VITAMIN D3) 25 MCG (1000 UT) CAPS Take 1,000 Units by mouth daily.   Yes [provider]  divalproex (DEPAKOTE ER) 500 MG 24 hr tablet Take 1 tablet (500 mg total) by mouth at bedtime. 01/19/22  Yes [name], MD  docusate sodium (COLACE) 100 MG capsule Take 100 mg by mouth daily.   Yes [provider]  finasteride (PROSCAR) 5 MG tablet Take 5 mg by mouth daily. 08/24/21  Yes [provider]  GAVILAX 17 GM/SCOOP powder Take 17 g by mouth daily.  04/26/22  Yes [provider]  losartan (COZAAR) 100 MG tablet Take 100 mg by mouth daily. 10/29/20  Yes [provider]  memantine (NAMENDA) 10 MG tablet Take 10 mg by mouth 2 (two) times daily. 02/08/22  Yes [provider]  MOVANTIK 25 MG TABS tablet Take 25 mg by mouth daily. 05/04/22  Yes [provider]  multivitamin-lutein (OCUVITE-LUTEIN) CAPS capsule Take 1 capsule by mouth daily.   Yes [provider]  MYRBETRIQ 25 MG TB24 tablet Take 25 mg by mouth daily. 11/02/21  Yes [provider]  rosuvastatin (CRESTOR) 20 MG tablet Take 20 mg by mouth daily.   Yes [provider]  tamsulosin (FLOMAX) 0.4 MG CAPS capsule Take 0.4 mg by mouth daily. 10/29/20  Yes [provider]  Travoprost, BAK Free, (TRAVATAN) 0.004 % SOLN ophthalmic solution Place 1 drop into both eyes at bedtime.   Yes [provider]    Current Facility-Administered Medications  Medication Dose Route Frequency Provider Last Rate Last Admin   acetaminophen (TYLENOL) tablet 650 mg  650 mg Oral Q6H PRN Kristopher Oppenheim, DO   650 mg at 05/21/22 1059   amLODipine (NORVASC) tablet 10 mg  10 mg Oral Daily Alma Friendly, MD   10 mg at 05/22/22 5208   aspirin chewable tablet 81 mg  81 mg Oral Daily Wynetta Fines T, MD   81 mg at 05/22/22 0223   ciprofloxacin (CIPRO) tablet 500 mg  500 mg Oral BID Alma Friendly, MD   500 mg at 05/22/22 0916   divalproex (DEPAKOTE ER) 24 hr tablet 500 mg  500 mg Oral QHS Wynetta Fines T, MD   500 mg at 05/21/22 2151   docusate sodium (COLACE) capsule 100 mg  100 mg Oral Daily Wynetta Fines T, MD   100 mg at 05/22/22 0915   finasteride (PROSCAR) tablet 5 mg  5 mg Oral Daily Wynetta Fines T, MD   5 mg at 05/22/22 0915   heparin injection 5,000 Units  5,000 Units Subcutaneous Q12H Vladimir Crofts, PA-C   5,000 Units at 05/22/22 3612   hydrALAZINE (APRESOLINE) tablet 25 mg  25 mg Oral Q6H PRN Wynetta Fines T, MD       latanoprost  (XALATAN) 0.005 % ophthalmic solution 1 drop  1 drop Both Eyes QHS Wynetta Fines T, MD   1 drop at 05/21/22 2152   linaclotide (LINZESS) capsule 145 mcg  145 mcg Oral QAC breakfast Walden Field A, NP   145 mcg at 05/22/22 0542   memantine (NAMENDA) tablet 10 mg  10 mg Oral BID Wynetta Fines T, MD   10 mg at 05/22/22 0914   mirabegron ER (MYRBETRIQ) tablet 25 mg  25 mg Oral Daily Wynetta Fines T,  MD   25 mg at 05/22/22 0914   Oral care mouth rinse  15 mL Mouth Rinse PRN Lequita Halt, MD       peg 3350 powder (MOVIPREP) kit 100 g  0.5 kit Oral Once Theotis Burrow, [facility]       And   peg 3350 powder (MOVIPREP) kit 100 g  0.5 kit Oral Once Theotis Burrow, RPH       rosuvastatin (CRESTOR) tablet 20 mg  20 mg Oral Daily Wynetta Fines T, MD   20 mg at 05/22/22 0914   senna-docusate (Senokot-S) tablet 1 tablet  1 tablet Oral QHS Alma Friendly, MD   1 tablet at 05/21/22 2151   tamsulosin (FLOMAX) capsule 0.4 mg  0.4 mg Oral Daily Wynetta Fines T, MD   0.4 mg at 05/22/22 0915   traMADol (ULTRAM) tablet 50 mg  50 mg Oral Q8H PRN Alma Friendly, MD   50 mg at 05/22/22 0545    Allergies as of 05/15/2022   (No Known Allergies)    Review of Systems:    Constitutional: No weight loss, fever, chills, weakness or fatigue HEENT: Eyes: No change in vision               Ears, Nose, Throat:  No change in hearing or congestion Skin: No rash or itching Cardiovascular: No chest pain, chest pressure or palpitations   Respiratory: No SOB or cough Gastrointestinal: See HPI and otherwise negative Genitourinary: No dysuria or change in urinary frequency Neurological: No headache, dizziness or syncope Musculoskeletal: No new muscle or joint pain Hematologic: No bleeding or bruising Psychiatric: No history of depression or anxiety     Physical Exam:  Vital signs in last 24 hours: Temp:  [98.4 F (36.9 C)-99 F (37.2 C)] 98.4 F (36.9 C) (10/16 0755) Pulse Rate:  [86-102] 102 (10/16 0755) Resp:  [18-19] 18  (10/16 0755) BP: (128-154)/(74-87) 150/84 (10/16 0914) SpO2:  [95 %] 95 % (10/16 0755) Last BM Date : 05/22/22 Last BM recorded by nurses in past 5 days Stool Type: Type 3 (Sausage shape with surface cracks) (05/22/2022  1:00 PM)  General:   Pleasant, well-developed African-American male sitting at window no acute distress Head:  Normocephalic and atraumatic. Eyes: sclerae anicteric,conjunctive pink  Heart:  regular rate and rhythm, holosystolic murmur slightly tachycardic Pulm: Clear anteriorly; no wheezing Abdomen:  Soft, Obese AB, Sluggish bowel sounds. No tenderness . , No organomegaly appreciated. Extremities:  Without edema. Msk:  Symmetrical without gross deformities. Peripheral pulses intact.  Neurologic:  Alert and  oriented x3, still not oriented to time;  No focal deficits.  Skin:   Dry and intact without significant lesions or rashes. Psychiatric:  Cooperative. Normal mood and affect.  LAB RESULTS: Recent Labs    05/20/22 0647 05/21/22 0736 05/22/22 0509  WBC 13.1* 12.2* 10.6*  HGB 12.7* 12.4* 12.3*  HCT 35.9* 35.8* 35.5*  PLT 194 206 159    BMET Recent Labs    05/20/22 0647 05/21/22 0736 05/22/22 0509  NA 134* 137 137  K 3.6 3.7 4.1  CL 103 102 99  CO2 $Re'27 27 26  'tpp$ GLUCOSE 102* 101* 112*  BUN $Re'12 15 18  'dle$ CREATININE 1.27* 1.33* 1.40*  CALCIUM 8.8* 8.9 9.0    LFT Recent Labs    05/22/22 0509  ALBUMIN 2.6*    PT/INR No results for input(s): "LABPROT", "INR" in the last 72 hours.  STUDIES: CT ABDOMEN PELVIS W CONTRAST  Result Date:  05/21/2022 CLINICAL DATA:  Bowel obstruction suspected. EXAM: CT ABDOMEN AND PELVIS WITH CONTRAST TECHNIQUE: Multidetector CT imaging of the abdomen and pelvis was performed using the standard protocol following bolus administration of intravenous contrast. RADIATION DOSE REDUCTION: This exam was performed according to the departmental dose-optimization program which includes automated exposure control, adjustment of the mA  and/or kV according to patient size and/or use of iterative reconstruction technique. CONTRAST:  58mL OMNIPAQUE IOHEXOL 350 MG/ML SOLN COMPARISON:  08/26/2021. FINDINGS: Lower chest: Coronary artery calcifications are noted. Atelectasis is noted at the lung bases bilaterally. There is bronchiectasis in the right lower lobe. There is a 5 mm nodular density in the right middle lobe, axial image 23. Hepatobiliary: No focal liver abnormality is seen. No gallstones, gallbladder wall thickening, or biliary dilatation. Pancreas: Unremarkable. No pancreatic ductal dilatation or surrounding inflammatory changes. Spleen: Normal in size without focal abnormality. Adrenals/Urinary Tract: Thickening of the bilateral adrenal glands with no evidence of discrete nodule. The kidneys enhance symmetrically. Cysts are present in the kidneys bilaterally. Subcentimeter hypodensities are noted bilaterally which are too small to further characterize. No renal calculus or hydronephrosis. The bladder is unremarkable. Stomach/Bowel: Stomach is within normal limits. Appendix is not well seen. No bowel obstruction, free air, or pneumatosis. Air-fluid levels are noted in the rectum with rectal wall thickening and perirectal fat stranding and edema. A moderate amount of retained stool is present in the colon. Vascular/Lymphatic: Aortic atherosclerosis. No enlarged abdominal or pelvic lymph nodes. Reproductive: Prostate is unremarkable. Other: No abdominopelvic ascites. Musculoskeletal: Degenerative changes are present in the thoracolumbar spine. No acute or suspicious osseous abnormality. IMPRESSION: 1. No bowel obstruction or free air. 2. Rectal wall thickening with perirectal fat stranding and edema suggesting proctitis. 3. Moderate amount of retained stool in the colon, possible constipation. 4. Bilateral renal cysts. 5. Aortic atherosclerosis. Electronically Signed   By: Brett Fairy M.D.   On: 05/21/2022 23:29   DG Abd Portable  1V  Result Date: 05/21/2022 CLINICAL DATA:  Constipation EXAM: PORTABLE ABDOMEN - 1 VIEW COMPARISON:  Abdominal x-ray 04/26/2022 FINDINGS: Air seen throughout large and small bowel to the level the rectum. Stool burden is moderate. There is some dilated colonic loops in the right abdomen measuring up to 8 cm. No suspicious calcifications. Lung bases are clear. No acute fractures. IMPRESSION: 1. Dilated colonic loops in the right abdomen measuring up to 8 cm. Findings are indeterminate and can be related to bowel obstruction or ileus. Recommend further evaluation with CT. Electronically Signed   By: Ronney Asters M.D.   On: 05/21/2022 18:33     Kamaree Berkel T. Fuller Plan  05/22/2022, 4:18 PM

## 2022-05-22 NOTE — H&P (View-Only) (Signed)
Consultation  Referring Provider:   [facility] Primary Care Physician:  [name], FNP Primary Gastroenterologist:  Althia Forts       Reason for Consultation:     Proctitis, constipation   Impression    Acute onset constipation 3 weeks of acute onset constipation patient has had some weight loss per family and they are concerned as last colonoscopy was over 5 years ago in [place] with polyps. CT and pelvis with contrast no bowel obstruction, showed rectal wall thickening with perirectal fat stranding with edema suggesting proctitis, moderate amount of retained stool in the colon Currently on Colace 100 mg once daily, Linzess 145 mcg started 10/11, MiraLAX 17 g once daily, Senokot 1 tablet at bedtime started on yesterday. Having some movements here, small-volume, liquid with soft stools.  Normocytic anemia Hemoglobin 12.3 Normal B12  AMS with history of Alzheimer's dementia Improvement with Rocephin/treatment of UTI-currently on Cipro 500 mg twice daily At baseline per wife Ammonia and B12 are normal.  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  HFpEF  12/03/2019 echo EF 60 to 91%, grade 1 diastolic dysfunction mild AI Does not appear to be fluid overloaded at this time   LOS: 7 days     Plan   -High probability this could be proctitis from constipation, however patient has had some weight loss, last colonoscopy over 7 years ago, and wife is concerned about the acuity of the constipation.  Patient does have normocytic anemia, likely from CKD but iron has not been checked.  B12 normal. After discussion with wife, patient and daughter they are interested in pursuing endoscopic evaluation which I think is reasonable in this hospital setting.  -We have discussed the risks of bleeding, infection, perforation, medication reactions, and  remote risk of death associated with colonoscopy. All questions were answered and the patient acknowledges these risk and wishes to proceed. -Moviprep, clear liquid diet, NPO at midnight. - Hold heparin at least 6 hours prior to procedure.  -Will plan for tentative Colonoscopy tomorrow with Dr. Fuller Plan, however patient does not have clear stools or cannot tolerate prep we will plan on fleck sigmoidoscopy with enemas.   Thank you for your kind consultation, we will continue to follow.   Attending Physician Note   I have taken a history, reviewed the chart and examined the patient. I performed a substantive portion of this encounter, including complete performance of at least one of the key components, in conjunction with the APP. I agree with the APP's note, impression and recommendations with my edits. My additional impressions and recommendations are as follows.   Worsening constipation, weight loss and CT showing rectal wall thickening with perirectal stranding and edema c/w proctitis. Possible history of colon polyps. Colonoscopy tomorrow to further evaluate. If prep is not adequate will proceed with flex sigmoidoscopy instead.   Lucio Edward, MD [facility] See [name], [facility], for our on call provider            HPI:   Eric Barrett. is a 81 y.o. 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 Admitted 05/15/2022 with AMS, acute  on chronic hyponatremia from increasing fluids due to constipation, AKI on CKD, hyperkalemia and UTI.  In the ER patient was afebrile, hemodynamically stable.  White blood cell count 35.5, hemoglobin 13.4, MCV 78.5, currently hemoglobin 12.3.  And white blood cell decreased 10.6.  Normal platelets  sodium 123, creatinine 1.3 compared to baseline of 0.9, potassium 5.3, UA positive for RBCs and WBC.  Already on Rocephin for UTI management. Patient appears to have baseline constipation, I on MiraLAX 17 g daily, Movantik 25 mg daily. Patient had palliative care consult, had slight abdominal distention KUB showed dilated colonic loops right abdomen up to 8 cm, indeterminate related to bowel obstruction or ileus. Subsequent CT abdomen pelvis with contrast showed no bowel obstruction or free air, rectal wall thickening with perirectal fat stranding and edema suggesting proctitis, moderate amount of retained stool in the colon possible constipation, bilateral renal cyst and aortic atherosclerosis.  Patient sitting up in chair eating lunch, wife Eric Barrett at bedside.  Patient has dementia, wife provides majority of history, she actually follows with Dr. Loletha Carrow in our practice.   Daughter Eric Barrett is on the phone as well she is a Marine scientist. Last saw GI will over 5 years ago in [place], Dr. Deno Etienne, states believe he did have some polyps but no recall due to age.  No family history of colon cancer. Wife states patient had bowel movement every day or every other day prior to this but then acutely 3 weeks ago began to have worsening constipation.  Having very small bowel movements, still passing gas. Patient started on MiraLAX once daily, Metamucil, started glycerin suppositories nightly, castor oil, prune juice, started on Senokot 2 capsules 3 to 4 days before coming here. Patient's had bowel movements here, small, mainly liquids some light brown fluffy stool with some mucus.  Patient did have bowel movement with me in the room on bedside commode, which is majority liquid with same descriptor. No Blood. Patient's wife and daughter 1 on the phone concerned about recent weight loss past month. Patient was also given Movantik prescription by PCP, patient denies any outpatient opioid use.  Denies any change of medications,  has had some decreased ambulation which could be contributing to constipation. Patient denies any rectal pain but has had 10 out of 10 tailbone pain at very lower back for the last 3 weeks. Denies any melena or hematochezia. Denies abdominal pain, nausea or vomiting or obstructive symptoms.  No reflux, dysphagia. Has had abdominal distention in the last 3 weeks worse within the last week. Patient denies history of radiation therapy. Patient normally walks with walker, lives with his wife, plans on going to skilled nursing facility after this for rehabilitation.     Past Medical History:  Diagnosis Date   CHF (congestive heart failure) (HCC)    Glaucoma    Hemiparesis of left nondominant side (HCC)    History of shingles    HLD (hyperlipidemia)    Hypertension    Stroke [facility])    Tremor     Surgical History:  He  has a past surgical history that includes No past surgery. Family History:  His family history includes Heart attack in his father and mother. Social History:   reports that he has quit smoking. He has never used smokeless tobacco. He reports that he does not currently use alcohol. He reports that he does not use drugs.  Prior to Admission medications   Medication Sig Start Date End Date Taking? Authorizing Provider  amLODipine (NORVASC)  10 MG tablet Take 10 mg by mouth daily. 07/08/21  Yes [provider]  ASPIRIN 81 PO Take 81 mg by mouth daily.   Yes [provider]  Cholecalciferol (VITAMIN D3) 25 MCG (1000 UT) CAPS Take 1,000 Units by mouth daily.   Yes [provider]  divalproex (DEPAKOTE ER) 500 MG 24 hr tablet Take 1 tablet (500 mg total) by mouth at bedtime. 01/19/22  Yes [name], MD  docusate sodium (COLACE) 100 MG capsule Take 100 mg by mouth daily.   Yes [provider]  finasteride (PROSCAR) 5 MG tablet Take 5 mg by mouth daily. 08/24/21  Yes [provider]  GAVILAX 17 GM/SCOOP powder Take 17 g by mouth daily.  04/26/22  Yes [provider]  losartan (COZAAR) 100 MG tablet Take 100 mg by mouth daily. 10/29/20  Yes [provider]  memantine (NAMENDA) 10 MG tablet Take 10 mg by mouth 2 (two) times daily. 02/08/22  Yes [provider]  MOVANTIK 25 MG TABS tablet Take 25 mg by mouth daily. 05/04/22  Yes [provider]  multivitamin-lutein (OCUVITE-LUTEIN) CAPS capsule Take 1 capsule by mouth daily.   Yes [provider]  MYRBETRIQ 25 MG TB24 tablet Take 25 mg by mouth daily. 11/02/21  Yes [provider]  rosuvastatin (CRESTOR) 20 MG tablet Take 20 mg by mouth daily.   Yes [provider]  tamsulosin (FLOMAX) 0.4 MG CAPS capsule Take 0.4 mg by mouth daily. 10/29/20  Yes [provider]  Travoprost, BAK Free, (TRAVATAN) 0.004 % SOLN ophthalmic solution Place 1 drop into both eyes at bedtime.   Yes [provider]    Current Facility-Administered Medications  Medication Dose Route Frequency Provider Last Rate Last Admin   acetaminophen (TYLENOL) tablet 650 mg  650 mg Oral Q6H PRN Kristopher Oppenheim, DO   650 mg at 05/21/22 1059   amLODipine (NORVASC) tablet 10 mg  10 mg Oral Daily Alma Friendly, MD   10 mg at 05/22/22 8527   aspirin chewable tablet 81 mg  81 mg Oral Daily Wynetta Fines T, MD   81 mg at 05/22/22 7824   ciprofloxacin (CIPRO) tablet 500 mg  500 mg Oral BID Alma Friendly, MD   500 mg at 05/22/22 0916   divalproex (DEPAKOTE ER) 24 hr tablet 500 mg  500 mg Oral QHS Wynetta Fines T, MD   500 mg at 05/21/22 2151   docusate sodium (COLACE) capsule 100 mg  100 mg Oral Daily Wynetta Fines T, MD   100 mg at 05/22/22 0915   finasteride (PROSCAR) tablet 5 mg  5 mg Oral Daily Wynetta Fines T, MD   5 mg at 05/22/22 0915   heparin injection 5,000 Units  5,000 Units Subcutaneous Q12H Vladimir Crofts, PA-C   5,000 Units at 05/22/22 2353   hydrALAZINE (APRESOLINE) tablet 25 mg  25 mg Oral Q6H PRN Wynetta Fines T, MD       latanoprost  (XALATAN) 0.005 % ophthalmic solution 1 drop  1 drop Both Eyes QHS Wynetta Fines T, MD   1 drop at 05/21/22 2152   linaclotide (LINZESS) capsule 145 mcg  145 mcg Oral QAC breakfast Walden Field A, NP   145 mcg at 05/22/22 0542   memantine (NAMENDA) tablet 10 mg  10 mg Oral BID Wynetta Fines T, MD   10 mg at 05/22/22 0914   mirabegron ER (MYRBETRIQ) tablet 25 mg  25 mg Oral Daily Wynetta Fines T,  MD   25 mg at 05/22/22 0914   Oral care mouth rinse  15 mL Mouth Rinse PRN Lequita Halt, MD       peg 3350 powder (MOVIPREP) kit 100 g  0.5 kit Oral Once Theotis Burrow, [facility]       And   peg 3350 powder (MOVIPREP) kit 100 g  0.5 kit Oral Once Theotis Burrow, RPH       rosuvastatin (CRESTOR) tablet 20 mg  20 mg Oral Daily Wynetta Fines T, MD   20 mg at 05/22/22 0914   senna-docusate (Senokot-S) tablet 1 tablet  1 tablet Oral QHS Alma Friendly, MD   1 tablet at 05/21/22 2151   tamsulosin (FLOMAX) capsule 0.4 mg  0.4 mg Oral Daily Wynetta Fines T, MD   0.4 mg at 05/22/22 0915   traMADol (ULTRAM) tablet 50 mg  50 mg Oral Q8H PRN Alma Friendly, MD   50 mg at 05/22/22 0545    Allergies as of 05/15/2022   (No Known Allergies)    Review of Systems:    Constitutional: No weight loss, fever, chills, weakness or fatigue HEENT: Eyes: No change in vision               Ears, Nose, Throat:  No change in hearing or congestion Skin: No rash or itching Cardiovascular: No chest pain, chest pressure or palpitations   Respiratory: No SOB or cough Gastrointestinal: See HPI and otherwise negative Genitourinary: No dysuria or change in urinary frequency Neurological: No headache, dizziness or syncope Musculoskeletal: No new muscle or joint pain Hematologic: No bleeding or bruising Psychiatric: No history of depression or anxiety     Physical Exam:  Vital signs in last 24 hours: Temp:  [98.4 F (36.9 C)-99 F (37.2 C)] 98.4 F (36.9 C) (10/16 0755) Pulse Rate:  [86-102] 102 (10/16 0755) Resp:  [18-19] 18  (10/16 0755) BP: (128-154)/(74-87) 150/84 (10/16 0914) SpO2:  [95 %] 95 % (10/16 0755) Last BM Date : 05/22/22 Last BM recorded by nurses in past 5 days Stool Type: Type 3 (Sausage shape with surface cracks) (05/22/2022  1:00 PM)  General:   Pleasant, well-developed African-American male sitting at window no acute distress Head:  Normocephalic and atraumatic. Eyes: sclerae anicteric,conjunctive pink  Heart:  regular rate and rhythm, holosystolic murmur slightly tachycardic Pulm: Clear anteriorly; no wheezing Abdomen:  Soft, Obese AB, Sluggish bowel sounds. No tenderness . , No organomegaly appreciated. Extremities:  Without edema. Msk:  Symmetrical without gross deformities. Peripheral pulses intact.  Neurologic:  Alert and  oriented x3, still not oriented to time;  No focal deficits.  Skin:   Dry and intact without significant lesions or rashes. Psychiatric:  Cooperative. Normal mood and affect.  LAB RESULTS: Recent Labs    05/20/22 0647 05/21/22 0736 05/22/22 0509  WBC 13.1* 12.2* 10.6*  HGB 12.7* 12.4* 12.3*  HCT 35.9* 35.8* 35.5*  PLT 194 206 159    BMET Recent Labs    05/20/22 0647 05/21/22 0736 05/22/22 0509  NA 134* 137 137  K 3.6 3.7 4.1  CL 103 102 99  CO2 $Re'27 27 26  'PrF$ GLUCOSE 102* 101* 112*  BUN $Re'12 15 18  'LoG$ CREATININE 1.27* 1.33* 1.40*  CALCIUM 8.8* 8.9 9.0    LFT Recent Labs    05/22/22 0509  ALBUMIN 2.6*    PT/INR No results for input(s): "LABPROT", "INR" in the last 72 hours.  STUDIES: CT ABDOMEN PELVIS W CONTRAST  Result Date:  05/21/2022 CLINICAL DATA:  Bowel obstruction suspected. EXAM: CT ABDOMEN AND PELVIS WITH CONTRAST TECHNIQUE: Multidetector CT imaging of the abdomen and pelvis was performed using the standard protocol following bolus administration of intravenous contrast. RADIATION DOSE REDUCTION: This exam was performed according to the departmental dose-optimization program which includes automated exposure control, adjustment of the mA  and/or kV according to patient size and/or use of iterative reconstruction technique. CONTRAST:  6mL OMNIPAQUE IOHEXOL 350 MG/ML SOLN COMPARISON:  08/26/2021. FINDINGS: Lower chest: Coronary artery calcifications are noted. Atelectasis is noted at the lung bases bilaterally. There is bronchiectasis in the right lower lobe. There is a 5 mm nodular density in the right middle lobe, axial image 23. Hepatobiliary: No focal liver abnormality is seen. No gallstones, gallbladder wall thickening, or biliary dilatation. Pancreas: Unremarkable. No pancreatic ductal dilatation or surrounding inflammatory changes. Spleen: Normal in size without focal abnormality. Adrenals/Urinary Tract: Thickening of the bilateral adrenal glands with no evidence of discrete nodule. The kidneys enhance symmetrically. Cysts are present in the kidneys bilaterally. Subcentimeter hypodensities are noted bilaterally which are too small to further characterize. No renal calculus or hydronephrosis. The bladder is unremarkable. Stomach/Bowel: Stomach is within normal limits. Appendix is not well seen. No bowel obstruction, free air, or pneumatosis. Air-fluid levels are noted in the rectum with rectal wall thickening and perirectal fat stranding and edema. A moderate amount of retained stool is present in the colon. Vascular/Lymphatic: Aortic atherosclerosis. No enlarged abdominal or pelvic lymph nodes. Reproductive: Prostate is unremarkable. Other: No abdominopelvic ascites. Musculoskeletal: Degenerative changes are present in the thoracolumbar spine. No acute or suspicious osseous abnormality. IMPRESSION: 1. No bowel obstruction or free air. 2. Rectal wall thickening with perirectal fat stranding and edema suggesting proctitis. 3. Moderate amount of retained stool in the colon, possible constipation. 4. Bilateral renal cysts. 5. Aortic atherosclerosis. Electronically Signed   By: Brett Fairy M.D.   On: 05/21/2022 23:29   DG Abd Portable  1V  Result Date: 05/21/2022 CLINICAL DATA:  Constipation EXAM: PORTABLE ABDOMEN - 1 VIEW COMPARISON:  Abdominal x-ray 04/26/2022 FINDINGS: Air seen throughout large and small bowel to the level the rectum. Stool burden is moderate. There is some dilated colonic loops in the right abdomen measuring up to 8 cm. No suspicious calcifications. Lung bases are clear. No acute fractures. IMPRESSION: 1. Dilated colonic loops in the right abdomen measuring up to 8 cm. Findings are indeterminate and can be related to bowel obstruction or ileus. Recommend further evaluation with CT. Electronically Signed   By: Ronney Asters M.D.   On: 05/21/2022 18:33     Hernando Reali T. Fuller Plan  05/22/2022, 4:18 PM

## 2022-05-22 NOTE — TOC Transition Note (Addendum)
Transition of Care [facility]) - CM/SW Discharge Note   Patient Details  Name: Eric Barrett. MRN: [id number] Date of Birth: [birth year]  [facility]) CM/SW Contact:  Curlene Labrum, RN Phone Number: 05/22/2022, 11:15 AM   Clinical Narrative:    CM spoke with Margaretmary Lombard, Admission CM at [facility] [place] and the patient's authorization for SNF placement at the facility was approved.  I updated the patient and wife at the bedside that the patient would be able to transfer to the facility today once discharge orders and summary were completed.  Medicaid application:  I asked Shanon Rosser to assist the patient's wife with Medicaid application.  The patient also may have VA benefits and I asked the patient's wife and daughter, Wandra Mannan to follow up with VA regarding benefits.  [name] wife states that she has not received a card from the [place] yet and will follow up.  I gave a paper Medicaid application to the patient's wife and daughter so that they would have information to follow up if financial counseling assists.  [name] wife states that she does not need LTC Medicaid that she only needs assistance in the home.  I requested attending MD to complete discharge orders and summary so that patient can discharge to the facility today.  Discharge summary will be uploaded in the hub once available.  CM will continue to follow the patient for discharge to [facility] today by [facility]AR.  Bedside nursing - please call report to [facility] at [phone].  05/22/2022 1531 - GI attending met with the patient and patient's discharge is delayed at this time to have GI evaluation tomorrow with pending planned colonoscopy - likely by Dr. Fuller Plan.  I updated [facility] and she is aware that patient will not be discharged to the facility today - but likely in the next 1-2 days pending results of GI study.   Final next level of  care: Skilled Nursing Facility Barriers to Discharge: Continued Medical Work up   Patient Goals and CMS Choice Patient states their goals for this hospitalization and ongoing recovery are:: to go to rehab before going home with wife CMS Medicare.gov Compare Post Acute Care list provided to:: Patient Represenative (must comment) (wife) Choice offered to / list presented to : Spouse  Discharge Placement                       Discharge Plan and Services   Discharge Planning Services: CM Consult Post Acute Care Choice: [place]                               Social Determinants of Health (SDOH) Interventions Housing Interventions: Intervention Not Indicated   Readmission Risk Interventions    05/17/2022    4:04 PM  Readmission Risk Prevention Plan  Transportation Screening Complete  PCP or Specialist Appt within 5-7 Days Complete  Home Care Screening Complete  Medication Review (RN CM) Complete

## 2022-05-22 NOTE — Progress Notes (Signed)
PROGRESS NOTE    Eric Barrett.  JQB:[id number] DOB: [birth year] DOA: 05/15/2022 PCP: [name], FNP   Brief Narrative:  81 y.o. male with medical history significant of Alzheimer's dementia, HTN, HLD, chronic HFpEF, remote stroke with residual left-sided weakness, glaucoma, seizure disorder presented with altered mental status and dysuria.  Apparently, patient had been drinking 6 to 8 -16 ounces bottles of water recently.  He saw his PCP 4 days ago, blood work showed sodium of 122, UA showed hematuria and pyuria, p.o. antibiotics was ordered however patient had not started antibiotics yet.  Patient presented to the ED more confused and sodium was 123, UA showing hematuria and pyuria again.  He was started on Rocephin and admitted for further management.    Assessment & Plan:   Acute on chronic hyponatremia Improved Likely secondary to poor solute intake Vs SIADH Nephrology consulted, appreciate recs S/P IV fluids Daily BMP  Acute metabolic encephalopathy-improving History of Alzheimer's dementia Possibly from UTI and hyponatremia PT/OT eval Continue memantine Delirium precaution  MRSE UTI Currently afebrile, with resolving leukocytosis UA positive for UTI UC growing Staph epidermidis >100,000 BC x2 NGTD S/p ceftriaxone, switched to p.o. ciprofloxacin  AKI on CKD stage IIIa Improving Creatinine 1.34 on admission, trended up and peaked to 2.4 Possible ATN given frequent hypotensive episodes VS UTI Renal ultrasound was negative for hydronephrosis Continue to hold ARB BMP  Acute on chronic lower back pain DG lumbar spine showed degenerative changes basically worse at L4-L5 Pain management  Proctitis History of constipation CT abdomen/pelvis showed rectal wall thickening with perirectal fat stranding suggesting proctitis, moderate amount of retained stool in the colon GI consulted, plan for colonoscopy/flex sig after bowel prep Bowel  regimen  Hyperkalemia-resolved Potassium trended up to 6.6, now stable Repeat BMP  Hypertension BP now uncontrolled Restart amlodipine, continue to hold losartan  Chronic diastolic CHF Currently compensated Losartan on hold because of AKI  BPH Continue Proscar and Flomax Outpatient follow-up with urology  Seizure disorder Continue Depakote Outpatient follow-up with neurology  Goals of care Palliative care consultation for goals of care discussion Full scope of care for now     DVT prophylaxis: Heparin subcutaneous Code Status: Full Family Communication: None at bedside Disposition Plan: Status is: Inpatient Remains inpatient appropriate because: Of severity of illness   Consultants: Nephrology/palliative care  Procedures: None  Antimicrobials: Ciprofloxacin    Subjective: Continues to report pain around her tailbone area, constipation.    Objective: Vitals:   05/21/22 1952 05/22/22 0353 05/22/22 0755 05/22/22 0914  BP: 128/74 (!) 147/77 (!) 154/87 (!) 150/84  Pulse: 91 86 (!) 102   Resp: $Remo'19 18 18   'XvZMV$ Temp: 98.6 F (37 C) 99 F (37.2 C) 98.4 F (36.9 C)   TempSrc: Oral Oral Oral   SpO2: 95% 95% 95%   Weight:      Height:        Intake/Output Summary (Last 24 hours) at 05/22/2022 1839 Last data filed at 05/22/2022 1100 Gross per 24 hour  Intake 1600 ml  Output 2500 ml  Net -900 ml   Filed Weights   05/15/22 1820  Weight: 82.2 kg    Examination: General: NAD Cardiovascular: S1, S2 present Respiratory: CTAB Abdomen: Soft, nontender, nondistended, bowel sounds present Musculoskeletal: No bilateral pedal edema noted Skin: Normal Psychiatry: Normal mood     Data Reviewed: I have personally reviewed following labs and imaging studies  CBC: Recent Labs  Lab 05/18/22 0401 05/19/22 0351 05/20/22 0647 05/21/22  4854 05/22/22 0509  WBC 29.5* 18.3* 13.1* 12.2* 10.6*  NEUTROABS 26.8* 15.3* 9.2* 7.8* 6.8  HGB 11.3* 11.9* 12.7* 12.4*  12.3*  HCT 30.7* 33.5* 35.9* 35.8* 35.5*  MCV 78.1* 80.5 80.3 81.4 80.9  PLT 180 189 194 206 627   Basic Metabolic Panel: Recent Labs  Lab 05/18/22 0401 05/19/22 0351 05/20/22 0647 05/21/22 0736 05/22/22 0509  NA 134* 137 134* 137 137  K 4.3 4.3 3.6 3.7 4.1  CL 102 105 103 102 99  CO2 $Re'23 27 27 27 26  'Dbu$ GLUCOSE 126* 109* 102* 101* 112*  BUN $Re'20 13 12 15 18  'ZtT$ CREATININE 1.33* 1.33* 1.27* 1.33* 1.40*  CALCIUM 9.1 8.6* 8.8* 8.9 9.0  PHOS 3.3 3.2 3.5 4.1 3.8   GFR: Estimated Creatinine Clearance: 43.3 mL/min (A) (by C-G formula based on SCr of 1.4 mg/dL (H)). Liver Function Tests: Recent Labs  Lab 05/17/22 0322 05/18/22 0401 05/19/22 0351 05/20/22 0647 05/21/22 0736 05/22/22 0509  AST 26  --   --   --   --   --   ALT 18  --   --   --   --   --   ALKPHOS 39  --   --   --   --   --   BILITOT 0.5  --   --   --   --   --   PROT 5.6*  --   --   --   --   --   ALBUMIN 2.6* 2.5* 2.5* 2.7* 2.6* 2.6*   No results for input(s): "LIPASE", "AMYLASE" in the last 168 hours. Recent Labs  Lab 05/17/22 0322  AMMONIA 16   Coagulation Profile: No results for input(s): "INR", "PROTIME" in the last 168 hours. Cardiac Enzymes: No results for input(s): "CKTOTAL", "CKMB", "CKMBINDEX", "TROPONINI" in the last 168 hours. BNP (last 3 results) No results for input(s): "PROBNP" in the last 8760 hours. HbA1C: No results for input(s): "HGBA1C" in the last 72 hours. CBG: Recent Labs  Lab 05/16/22 1726  GLUCAP 160*   Lipid Profile: No results for input(s): "CHOL", "HDL", "LDLCALC", "TRIG", "CHOLHDL", "LDLDIRECT" in the last 72 hours. Thyroid Function Tests: No results for input(s): "TSH", "T4TOTAL", "FREET4", "T3FREE", "THYROIDAB" in the last 72 hours.  Anemia Panel: No results for input(s): "VITAMINB12", "FOLATE", "FERRITIN", "TIBC", "IRON", "RETICCTPCT" in the last 72 hours.  Sepsis Labs: No results for input(s): "PROCALCITON", "LATICACIDVEN" in the last 168 hours.  Recent Results  (from the past 240 hour(s))  Urine Culture     Status: Abnormal   Collection Time: 05/16/22  7:22 AM   Specimen: Urine, Clean Catch  Result Value Ref Range Status   Specimen Description URINE, CLEAN CATCH  Final   Special Requests   Final    NONE Performed at [facility], [address], [place], [place] [address]    Culture >=100,000 COLONIES/mL STAPHYLOCOCCUS EPIDERMIDIS (A)  Final   Report Status 05/18/2022 FINAL  Final   Organism ID, Bacteria STAPHYLOCOCCUS EPIDERMIDIS (A)  Final      Susceptibility   Staphylococcus epidermidis - MIC*    CIPROFLOXACIN <=0.5 SENSITIVE Sensitive     GENTAMICIN <=0.5 SENSITIVE Sensitive     NITROFURANTOIN <=16 SENSITIVE Sensitive     OXACILLIN >=4 RESISTANT Resistant     TETRACYCLINE 2 SENSITIVE Sensitive     VANCOMYCIN 2 SENSITIVE Sensitive     TRIMETH/SULFA 20 SENSITIVE Sensitive     CLINDAMYCIN <=0.25 SENSITIVE Sensitive     RIFAMPIN <=0.5  SENSITIVE Sensitive     Inducible Clindamycin NEGATIVE Sensitive     * >=100,000 COLONIES/mL STAPHYLOCOCCUS EPIDERMIDIS  Culture, blood (Routine X 2) w Reflex to ID Panel     Status: None   Collection Time: 05/16/22  7:38 AM   Specimen: BLOOD  Result Value Ref Range Status   Specimen Description BLOOD RIGHT ANTECUBITAL  Final   Special Requests   Final    BOTTLES DRAWN AEROBIC AND ANAEROBIC Blood Culture adequate volume   Culture   Final    NO GROWTH 5 DAYS Performed at [facility], [place] [address] [place], [place] [address]    Report Status 05/21/2022 FINAL  Final  Culture, blood (Routine X 2) w Reflex to ID Panel     Status: None   Collection Time: 05/16/22  7:41 AM   Specimen: BLOOD LEFT ARM  Result Value Ref Range Status   Specimen Description BLOOD LEFT ARM  Final   Special Requests   Final    BOTTLES DRAWN AEROBIC AND ANAEROBIC Blood Culture adequate volume   Culture   Final    NO GROWTH 5 DAYS Performed at [facility], [place] [address], [place], [place] [address]     Report Status 05/21/2022 FINAL  Final         Radiology Studies: DG Lumbar Spine 2-3 Views  Result Date: 05/22/2022 CLINICAL DATA:  Mid sagittal back pain EXAM: LUMBAR SPINE - 2-3 VIEW COMPARISON:  CT 05/21/2022 FINDINGS: Levocurvature. Vertebral body heights are maintained. Moderate disc space narrowing L3-L4 and L5-S1 with advanced degenerative changes at L4-L5. Facet degenerative changes of the lower lumbar spine. IMPRESSION: Degenerative changes, worst at L4-L5 Electronically Signed   By: Donavan Foil M.D.   On: 05/22/2022 18:03   CT ABDOMEN PELVIS W CONTRAST  Result Date: 05/21/2022 CLINICAL DATA:  Bowel obstruction suspected. EXAM: CT ABDOMEN AND PELVIS WITH CONTRAST TECHNIQUE: Multidetector CT imaging of the abdomen and pelvis was performed using the standard protocol following bolus administration of intravenous contrast. RADIATION DOSE REDUCTION: This exam was performed according to the departmental dose-optimization program which includes automated exposure control, adjustment of the mA and/or kV according to patient size and/or use of iterative reconstruction technique. CONTRAST:  60mL OMNIPAQUE IOHEXOL 350 MG/ML SOLN COMPARISON:  08/26/2021. FINDINGS: Lower chest: Coronary artery calcifications are noted. Atelectasis is noted at the lung bases bilaterally. There is bronchiectasis in the right lower lobe. There is a 5 mm nodular density in the right middle lobe, axial image 23. Hepatobiliary: No focal liver abnormality is seen. No gallstones, gallbladder wall thickening, or biliary dilatation. Pancreas: Unremarkable. No pancreatic ductal dilatation or surrounding inflammatory changes. Spleen: Normal in size without focal abnormality. Adrenals/Urinary Tract: Thickening of the bilateral adrenal glands with no evidence of discrete nodule. The kidneys enhance symmetrically. Cysts are present in the kidneys bilaterally. Subcentimeter hypodensities are noted bilaterally which are too small to  further characterize. No renal calculus or hydronephrosis. The bladder is unremarkable. Stomach/Bowel: Stomach is within normal limits. Appendix is not well seen. No bowel obstruction, free air, or pneumatosis. Air-fluid levels are noted in the rectum with rectal wall thickening and perirectal fat stranding and edema. A moderate amount of retained stool is present in the colon. Vascular/Lymphatic: Aortic atherosclerosis. No enlarged abdominal or pelvic lymph nodes. Reproductive: Prostate is unremarkable. Other: No abdominopelvic ascites. Musculoskeletal: Degenerative changes are present in the thoracolumbar spine. No acute or suspicious osseous abnormality. IMPRESSION: 1. No bowel obstruction or free air. 2. Rectal wall  thickening with perirectal fat stranding and edema suggesting proctitis. 3. Moderate amount of retained stool in the colon, possible constipation. 4. Bilateral renal cysts. 5. Aortic atherosclerosis. Electronically Signed   By: Brett Fairy M.D.   On: 05/21/2022 23:29   DG Abd Portable 1V  Result Date: 05/21/2022 CLINICAL DATA:  Constipation EXAM: PORTABLE ABDOMEN - 1 VIEW COMPARISON:  Abdominal x-ray 04/26/2022 FINDINGS: Air seen throughout large and small bowel to the level the rectum. Stool burden is moderate. There is some dilated colonic loops in the right abdomen measuring up to 8 cm. No suspicious calcifications. Lung bases are clear. No acute fractures. IMPRESSION: 1. Dilated colonic loops in the right abdomen measuring up to 8 cm. Findings are indeterminate and can be related to bowel obstruction or ileus. Recommend further evaluation with CT. Electronically Signed   By: Ronney Asters M.D.   On: 05/21/2022 18:33        Scheduled Meds:  amLODipine  10 mg Oral Daily   aspirin  81 mg Oral Daily   ciprofloxacin  500 mg Oral BID   divalproex  500 mg Oral QHS   docusate sodium  100 mg Oral Daily   finasteride  5 mg Oral Daily   heparin  5,000 Units Subcutaneous Q12H    latanoprost  1 drop Both Eyes QHS   linaclotide  145 mcg Oral QAC breakfast   memantine  10 mg Oral BID   mirabegron ER  25 mg Oral Daily   peg 3350 powder  0.5 kit Oral Once   rosuvastatin  20 mg Oral Daily   senna-docusate  1 tablet Oral QHS   tamsulosin  0.4 mg Oral Daily   Continuous Infusions:  sodium chloride             Alma Friendly, MD [facility] 05/22/2022, 6:39 PM

## 2022-05-22 NOTE — Consult Note (Addendum)
Consultation  Referring Provider:   [facility] Primary Care Physician:  [name], FNP Primary Gastroenterologist:  Althia Forts       Reason for Consultation:     Proctitis, constipation   Impression    Acute onset constipation 3 weeks of acute onset constipation patient has had some weight loss per family and they are concerned as last colonoscopy was over 5 years ago in [place] with polyps. CT and pelvis with contrast no bowel obstruction, showed rectal wall thickening with perirectal fat stranding with edema suggesting proctitis, moderate amount of retained stool in the colon Currently on Colace 100 mg once daily, Linzess 145 mcg started 10/11, MiraLAX 17 g once daily, Senokot 1 tablet at bedtime started on yesterday. Having some movements here, small-volume, liquid with soft stools.  Normocytic anemia Hemoglobin 12.3 Normal B12  AMS with history of Alzheimer's dementia Improvement with Rocephin/treatment of UTI-currently on Cipro 500 mg twice daily At baseline per wife Ammonia and B12 are normal.  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  HFpEF  12/03/2019 echo EF 60 to 41%, grade 1 diastolic dysfunction mild AI Does not appear to be fluid overloaded at this time    Principal Problem:   Hyponatremia    LOS: 7 days     Plan   -High probability this could be proctitis from constipation, however patient has had some weight loss, last colonoscopy over 7 years ago, and wife is concerned about the acuity of the constipation.  Patient does have normocytic anemia, likely from CKD but iron has not been checked.  B12 normal. After discussion with wife, patient and daughter they are interested in pursuing endoscopic evaluation which I think is reasonable in this hospital setting.  -We have discussed the risks of bleeding,  infection, perforation, medication reactions, and remote risk of death associated with colonoscopy. All questions were answered and the patient acknowledges these risk and wishes to proceed. -Moviprep, clear liquid diet, NPO at midnight. - Hold heparin at least 6 hours prior to procedure.  -Will plan for tentative Colonoscopy tomorrow with Dr. Fuller Plan, however patient does not have clear stools or cannot tolerate prep we will plan on fleck sigmoidoscopy with enemas.   Thank you for your kind consultation, we will continue to follow.         HPI:   Eric Bannister. is a 81 y.o. 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 Admitted 05/15/2022 with AMS, acute on chronic hyponatremia from increasing fluids due to constipation, AKI on CKD, hyperkalemia and UTI.  In the ER patient was afebrile, hemodynamically stable.  White blood cell count 35.5, hemoglobin 13.4, MCV 78.5, currently hemoglobin 12.3.  And white blood cell decreased 10.6.  Normal platelets sodium 123, creatinine 1.3 compared to baseline of 0.9, potassium 5.3, UA positive for RBCs and WBC.  Already on Rocephin for UTI management. Patient appears to have baseline constipation, I on MiraLAX 17 g daily, Movantik 25 mg daily. Patient had palliative care consult, had slight abdominal distention KUB showed dilated colonic loops right abdomen up to 8 cm, indeterminate related to bowel obstruction  or ileus. Subsequent CT abdomen pelvis with contrast showed no bowel obstruction or free air, rectal wall thickening with perirectal fat stranding and edema suggesting proctitis, moderate amount of retained stool in the colon possible constipation, bilateral renal cyst and aortic  atherosclerosis.  Patient sitting up in chair eating lunch, wife Eric Barrett at bedside.  Patient has dementia, wife provides majority of history, she actually follows with Dr. Loletha Carrow in our practice.   Daughter [name] is on the phone as well she is a Marine scientist. Last saw GI will over 5 years ago in [place], Dr. Deno Etienne, states believe he did have some polyps but no recall due to age.  No family history of colon cancer. Wife states patient had bowel movement every day or every other day prior to this but then acutely 3 weeks ago began to have worsening constipation.  Having very small bowel movements, still passing gas. Patient started on MiraLAX once daily, Metamucil, started glycerin suppositories nightly, castor oil, prune juice, started on Senokot 2 capsules 3 to 4 days before coming here. Patient's had bowel movements here, small, mainly liquids some light brown fluffy stool with some mucus.  Patient did have bowel movement with me in the room on bedside commode, which is majority liquid with same descriptor. No Blood. Patient's wife and daughter 1 on the phone concerned about recent weight loss past month. Patient was also given Movantik prescription by PCP, patient denies any outpatient opioid use.  Denies any change of medications, has had some decreased ambulation which could be contributing to constipation. Patient denies any rectal pain but has had 10 out of 10 tailbone pain at very lower back for the last 3 weeks. Denies any melena or hematochezia. Denies abdominal pain, nausea or vomiting or obstructive symptoms.  No reflux, dysphagia. Has had abdominal distention in the last 3 weeks worse within the last week. Patient denies history of radiation therapy. Patient normally walks with walker, lives with his wife, plans on going to skilled nursing facility after this for rehabilitation.     Past Medical History:  Diagnosis Date   CHF (congestive heart failure) (HCC)    Glaucoma    Hemiparesis  of left nondominant side (HCC)    History of shingles    HLD (hyperlipidemia)    Hypertension    Stroke [facility])    Tremor     Surgical History:  He  has a past surgical history that includes No past surgery. Family History:  His family history includes Heart attack in his father and mother. Social History:   reports that he has quit smoking. He has never used smokeless tobacco. He reports that he does not currently use alcohol. He reports that he does not use drugs.  Prior to Admission medications   Medication Sig Start Date End Date Taking? Authorizing Provider  amLODipine (NORVASC) 10 MG tablet Take 10 mg by mouth daily. 07/08/21  Yes [provider]  ASPIRIN 81 PO Take 81 mg by mouth daily.   Yes [provider]  Cholecalciferol (VITAMIN D3) 25 MCG (1000 UT) CAPS Take 1,000 Units by mouth daily.   Yes [provider]  divalproex (DEPAKOTE ER) 500 MG 24 hr tablet Take 1 tablet (500 mg total) by mouth at bedtime. 01/19/22  Yes [name], MD  docusate sodium (COLACE) 100 MG capsule Take 100 mg by mouth daily.   Yes [provider]  finasteride (PROSCAR) 5 MG tablet Take 5 mg by mouth daily. 08/24/21  Yes [provider]  GAVILAX 17 GM/SCOOP powder Take 17 g by mouth daily. 04/26/22  Yes [provider]  losartan (COZAAR) 100 MG tablet Take 100 mg by mouth daily. 10/29/20  Yes [provider]  memantine (NAMENDA) 10 MG tablet Take 10 mg by mouth 2 (two) times daily. 02/08/22  Yes [provider]  MOVANTIK 25 MG TABS tablet Take 25 mg by mouth daily. 05/04/22  Yes [provider]  multivitamin-lutein (OCUVITE-LUTEIN) CAPS capsule Take 1 capsule by mouth daily.   Yes [provider]  MYRBETRIQ 25 MG TB24 tablet Take 25 mg by mouth daily. 11/02/21  Yes [provider]  rosuvastatin (CRESTOR) 20 MG tablet Take 20 mg by mouth daily.   Yes [provider]  tamsulosin (FLOMAX) 0.4 MG CAPS capsule  Take 0.4 mg by mouth daily. 10/29/20  Yes [provider]  Travoprost, BAK Free, (TRAVATAN) 0.004 % SOLN ophthalmic solution Place 1 drop into both eyes at bedtime.   Yes [provider]    Current Facility-Administered Medications  Medication Dose Route Frequency Provider Last Rate Last Admin   acetaminophen (TYLENOL) tablet 650 mg  650 mg Oral Q6H PRN Kristopher Oppenheim, DO   650 mg at 05/21/22 1059   amLODipine (NORVASC) tablet 10 mg  10 mg Oral Daily Alma Friendly, MD   10 mg at 05/22/22 1610   aspirin chewable tablet 81 mg  81 mg Oral Daily Wynetta Fines T, MD   81 mg at 05/22/22 9604   ciprofloxacin (CIPRO) tablet 500 mg  500 mg Oral BID Alma Friendly, MD   500 mg at 05/22/22 0916   divalproex (DEPAKOTE ER) 24 hr tablet 500 mg  500 mg Oral QHS Wynetta Fines T, MD   500 mg at 05/21/22 2151   docusate sodium (COLACE) capsule 100 mg  100 mg Oral Daily Wynetta Fines T, MD   100 mg at 05/22/22 0915   finasteride (PROSCAR) tablet 5 mg  5 mg Oral Daily Wynetta Fines T, MD   5 mg at 05/22/22 0915   heparin injection 5,000 Units  5,000 Units Subcutaneous Q12H Wynetta Fines T, MD   5,000 Units at 05/22/22 5409   hydrALAZINE (APRESOLINE) tablet 25 mg  25 mg Oral Q6H PRN Lequita Halt, MD       latanoprost (XALATAN) 0.005 % ophthalmic solution 1 drop  1 drop Both Eyes QHS Wynetta Fines T, MD   1 drop at 05/21/22 2152   linaclotide (LINZESS) capsule 145 mcg  145 mcg Oral QAC breakfast Walden Field A, NP   145 mcg at 05/22/22 0542   memantine (NAMENDA) tablet 10 mg  10 mg Oral BID Wynetta Fines T, MD   10 mg at 05/22/22 0914   mirabegron ER (MYRBETRIQ) tablet 25 mg  25 mg Oral Daily Wynetta Fines T, MD   25 mg at 05/22/22 8119   Oral care mouth rinse  15 mL Mouth Rinse PRN Wynetta Fines T, MD       polyethylene glycol (MIRALAX / GLYCOLAX) packet 17 g  17 g Oral Daily Wynetta Fines T, MD   17 g at 05/22/22 0916   rosuvastatin (CRESTOR) tablet 20 mg  20 mg Oral Daily Wynetta Fines T, MD   20 mg at 05/22/22  0914   senna-docusate (Senokot-S) tablet 1 tablet  1 tablet Oral QHS Alma Friendly, MD   1 tablet at 05/21/22 2151   tamsulosin (FLOMAX) capsule 0.4 mg  0.4 mg Oral Daily  Lequita Halt, MD   0.4 mg at 05/22/22 0915   traMADol (ULTRAM) tablet 50 mg  50 mg Oral Q8H PRN Alma Friendly, MD   50 mg at 05/22/22 0545    Allergies as of 05/15/2022   (No Known Allergies)    Review of Systems:    Constitutional: No weight loss, fever, chills, weakness or fatigue HEENT: Eyes: No change in vision               Ears, Nose, Throat:  No change in hearing or congestion Skin: No rash or itching Cardiovascular: No chest pain, chest pressure or palpitations   Respiratory: No SOB or cough Gastrointestinal: See HPI and otherwise negative Genitourinary: No dysuria or change in urinary frequency Neurological: No headache, dizziness or syncope Musculoskeletal: No new muscle or joint pain Hematologic: No bleeding or bruising Psychiatric: No history of depression or anxiety     Physical Exam:  Vital signs in last 24 hours: Temp:  [98.4 F (36.9 C)-99 F (37.2 C)] 98.4 F (36.9 C) (10/16 0755) Pulse Rate:  [86-102] 102 (10/16 0755) Resp:  [18-19] 18 (10/16 0755) BP: (116-154)/(70-87) 150/84 (10/16 0914) SpO2:  [95 %-96 %] 95 % (10/16 0755) Last BM Date : 05/22/22 Last BM recorded by nurses in past 5 days Stool Type: Type 6 (Mushy consistency with ragged edges) (05/22/2022 11:00 AM)  General:   Pleasant, well-developed African-American male sitting at window no acute distress Head:  Normocephalic and atraumatic. Eyes: sclerae anicteric,conjunctive pink  Heart:  regular rate and rhythm, holosystolic murmur slightly tachycardic Pulm: Clear anteriorly; no wheezing Abdomen:  Soft, Obese AB, Sluggish bowel sounds. No tenderness . , No organomegaly appreciated. Extremities:  Without edema. Msk:  Symmetrical without gross deformities. Peripheral pulses intact.  Neurologic:  Alert and   oriented x3, still not oriented to time;  No focal deficits.  Skin:   Dry and intact without significant lesions or rashes. Psychiatric:  Cooperative. Normal mood and affect.  LAB RESULTS: Recent Labs    05/20/22 0647 05/21/22 0736 05/22/22 0509  WBC 13.1* 12.2* 10.6*  HGB 12.7* 12.4* 12.3*  HCT 35.9* 35.8* 35.5*  PLT 194 206 159   BMET Recent Labs    05/20/22 0647 05/21/22 0736 05/22/22 0509  NA 134* 137 137  K 3.6 3.7 4.1  CL 103 102 99  CO2 '27 27 26  '$ GLUCOSE 102* 101* 112*  BUN '12 15 18  '$ CREATININE 1.27* 1.33* 1.40*  CALCIUM 8.8* 8.9 9.0   LFT Recent Labs    05/22/22 0509  ALBUMIN 2.6*   PT/INR No results for input(s): "LABPROT", "INR" in the last 72 hours.  STUDIES: CT ABDOMEN PELVIS W CONTRAST  Result Date: 05/21/2022 CLINICAL DATA:  Bowel obstruction suspected. EXAM: CT ABDOMEN AND PELVIS WITH CONTRAST TECHNIQUE: Multidetector CT imaging of the abdomen and pelvis was performed using the standard protocol following bolus administration of intravenous contrast. RADIATION DOSE REDUCTION: This exam was performed according to the departmental dose-optimization program which includes automated exposure control, adjustment of the mA and/or kV according to patient size and/or use of iterative reconstruction technique. CONTRAST:  18m OMNIPAQUE IOHEXOL 350 MG/ML SOLN COMPARISON:  08/26/2021. FINDINGS: Lower chest: Coronary artery calcifications are noted. Atelectasis is noted at the lung bases bilaterally. There is bronchiectasis in the right lower lobe. There is a 5 mm nodular density in the right middle lobe, axial image 23. Hepatobiliary: No focal liver abnormality is seen. No gallstones, gallbladder wall thickening, or biliary dilatation. Pancreas: Unremarkable.  No pancreatic ductal dilatation or surrounding inflammatory changes. Spleen: Normal in size without focal abnormality. Adrenals/Urinary Tract: Thickening of the bilateral adrenal glands with no evidence of discrete  nodule. The kidneys enhance symmetrically. Cysts are present in the kidneys bilaterally. Subcentimeter hypodensities are noted bilaterally which are too small to further characterize. No renal calculus or hydronephrosis. The bladder is unremarkable. Stomach/Bowel: Stomach is within normal limits. Appendix is not well seen. No bowel obstruction, free air, or pneumatosis. Air-fluid levels are noted in the rectum with rectal wall thickening and perirectal fat stranding and edema. A moderate amount of retained stool is present in the colon. Vascular/Lymphatic: Aortic atherosclerosis. No enlarged abdominal or pelvic lymph nodes. Reproductive: Prostate is unremarkable. Other: No abdominopelvic ascites. Musculoskeletal: Degenerative changes are present in the thoracolumbar spine. No acute or suspicious osseous abnormality. IMPRESSION: 1. No bowel obstruction or free air. 2. Rectal wall thickening with perirectal fat stranding and edema suggesting proctitis. 3. Moderate amount of retained stool in the colon, possible constipation. 4. Bilateral renal cysts. 5. Aortic atherosclerosis. Electronically Signed   By: Brett Fairy M.D.   On: 05/21/2022 23:29   DG Abd Portable 1V  Result Date: 05/21/2022 CLINICAL DATA:  Constipation EXAM: PORTABLE ABDOMEN - 1 VIEW COMPARISON:  Abdominal x-ray 04/26/2022 FINDINGS: Air seen throughout large and small bowel to the level the rectum. Stool burden is moderate. There is some dilated colonic loops in the right abdomen measuring up to 8 cm. No suspicious calcifications. Lung bases are clear. No acute fractures. IMPRESSION: 1. Dilated colonic loops in the right abdomen measuring up to 8 cm. Findings are indeterminate and can be related to bowel obstruction or ileus. Recommend further evaluation with CT. Electronically Signed   By: Ronney Asters M.D.   On: 05/21/2022 18:33     Vladimir Crofts  05/22/2022, 1:40 PM

## 2022-05-23 ENCOUNTER — Encounter (HOSPITAL_COMMUNITY): Payer: Self-pay | Admitting: Internal Medicine

## 2022-05-23 DIAGNOSIS — E871 Hypo-osmolality and hyponatremia: Secondary | ICD-10-CM | POA: Diagnosis not present

## 2022-05-23 DIAGNOSIS — Z66 Do not resuscitate: Secondary | ICD-10-CM | POA: Diagnosis not present

## 2022-05-23 DIAGNOSIS — Z7189 Other specified counseling: Secondary | ICD-10-CM | POA: Diagnosis not present

## 2022-05-23 DIAGNOSIS — N3001 Acute cystitis with hematuria: Secondary | ICD-10-CM | POA: Diagnosis not present

## 2022-05-23 LAB — CBC WITH DIFFERENTIAL/PLATELET
Abs Immature Granulocytes: 0.12 10*3/uL — ABNORMAL HIGH (ref 0.00–0.07)
Basophils Absolute: 0 10*3/uL (ref 0.0–0.1)
Basophils Relative: 0 %
Eosinophils Absolute: 0.1 10*3/uL (ref 0.0–0.5)
Eosinophils Relative: 1 %
HCT: 37.6 % — ABNORMAL LOW (ref 39.0–52.0)
Hemoglobin: 12.7 g/dL — ABNORMAL LOW (ref 13.0–17.0)
Immature Granulocytes: 1 %
Lymphocytes Relative: 20 %
Lymphs Abs: 2.1 10*3/uL (ref 0.7–4.0)
MCH: 27.8 pg (ref 26.0–34.0)
MCHC: 33.8 g/dL (ref 30.0–36.0)
MCV: 82.3 fL (ref 80.0–100.0)
Monocytes Absolute: 1.4 10*3/uL — ABNORMAL HIGH (ref 0.1–1.0)
Monocytes Relative: 14 %
Neutro Abs: 6.4 10*3/uL (ref 1.7–7.7)
Neutrophils Relative %: 64 %
Platelets: 259 10*3/uL (ref 150–400)
RBC: 4.57 MIL/uL (ref 4.22–5.81)
RDW: 14.8 % (ref 11.5–15.5)
WBC: 10.1 10*3/uL (ref 4.0–10.5)
nRBC: 0 % (ref 0.0–0.2)

## 2022-05-23 LAB — RENAL FUNCTION PANEL
Albumin: 2.7 g/dL — ABNORMAL LOW (ref 3.5–5.0)
Anion gap: 9 (ref 5–15)
BUN: 17 mg/dL (ref 8–23)
CO2: 30 mmol/L (ref 22–32)
Calcium: 9 mg/dL (ref 8.9–10.3)
Chloride: 102 mmol/L (ref 98–111)
Creatinine, Ser: 1.49 mg/dL — ABNORMAL HIGH (ref 0.61–1.24)
GFR, Estimated: 47 mL/min — ABNORMAL LOW (ref >60)
Glucose, Bld: 124 mg/dL — ABNORMAL HIGH (ref 70–99)
Phosphorus: 4 mg/dL (ref 2.5–4.6)
Potassium: 4 mmol/L (ref 3.5–5.1)
Sodium: 141 mmol/L (ref 135–145)

## 2022-05-23 MED ORDER — PEG-KCL-NACL-NASULF-NA ASC-C 100 G PO SOLR
0.5000 | Freq: Once | ORAL | Status: AC
  Administered 2022-05-23: 100 g via ORAL
  Filled 2022-05-23: qty 1

## 2022-05-23 MED ORDER — PEG-KCL-NACL-NASULF-NA ASC-C 100 G PO SOLR: 1.0000 | Freq: Once | ORAL | Status: DC

## 2022-05-23 MED ORDER — METOCLOPRAMIDE HCL 5 MG/ML IJ SOLN
10.0000 mg | Freq: Four times a day (QID) | INTRAMUSCULAR | Status: AC
  Administered 2022-05-23 (×2): 10 mg via INTRAVENOUS
  Filled 2022-05-23 (×2): qty 2

## 2022-05-23 MED ORDER — BISACODYL 5 MG PO TBEC
20.0000 mg | DELAYED_RELEASE_TABLET | Freq: Once | ORAL | Status: AC
  Administered 2022-05-23: 20 mg via ORAL
  Filled 2022-05-23: qty 4

## 2022-05-23 NOTE — Anesthesia Preprocedure Evaluation (Addendum)
Anesthesia Evaluation  Patient identified by MRN, date of birth, ID band Patient awake    Reviewed: Allergy & Precautions, H&P , NPO status , Patient's Chart, lab work & pertinent test results  Airway Mallampati: IV  TM Distance: >3 FB Neck ROM: Full    Dental no notable dental hx. (+) Teeth Intact, Dental Advisory Given, Poor Dentition   Pulmonary former smoker   Pulmonary exam normal breath sounds clear to auscultation       Cardiovascular hypertension, Normal cardiovascular exam Rhythm:Regular Rate:Normal     Neuro/Psych CVA, Residual Symptoms  negative psych ROS   GI/Hepatic negative GI ROS, Neg liver ROS,,,  Endo/Other  negative endocrine ROS    Renal/GU negative Renal ROS  negative genitourinary   Musculoskeletal negative musculoskeletal ROS (+)    Abdominal   Peds negative pediatric ROS (+)  Hematology negative hematology ROS (+)   Anesthesia Other Findings   Reproductive/Obstetrics negative OB ROS                             Anesthesia Physical Anesthesia Plan  ASA: 3  Anesthesia Plan: MAC   Post-op Pain Management: Minimal or no pain anticipated   Induction: Intravenous  PONV Risk Score and Plan: 1 and Propofol infusion  Airway Management Planned: Simple Face Mask  Additional Equipment:   Intra-op Plan:   Post-operative Plan:   Informed Consent: I have reviewed the patients History and Physical, chart, labs and discussed the procedure including the risks, benefits and alternatives for the proposed anesthesia with the patient or authorized representative who has indicated his/her understanding and acceptance.     Dental advisory given  Plan Discussed with: CRNA and Anesthesiologist  Anesthesia Plan Comments:        Anesthesia Quick Evaluation

## 2022-05-23 NOTE — Progress Notes (Signed)
PROGRESS NOTE    Eric Barrett.  TKW:[id number] DOB: 1941-07-25 DOA: 05/15/2022 PCP: [name], FNP   Brief Narrative:  81 y.o. male with medical history significant of Alzheimer's dementia, HTN, HLD, chronic HFpEF, remote stroke with residual left-sided weakness, glaucoma, seizure disorder presented with altered mental status and dysuria.  Apparently, patient had been drinking 6 to 8 -16 ounces bottles of water recently.  He saw his PCP 4 days ago, blood work showed sodium of 122, UA showed hematuria and pyuria, p.o. antibiotics was ordered however patient had not started antibiotics yet.  Patient presented to the ED more confused and sodium was 123, UA showing hematuria and pyuria again.  He was started on Rocephin and admitted for further management.    Assessment & Plan:   Acute on chronic hyponatremia Improved Likely secondary to poor solute intake Vs SIADH Nephrology consulted, appreciate recs S/P IV fluids Daily BMP  Acute metabolic encephalopathy-improving History of Alzheimer's dementia Possibly from UTI and hyponatremia PT/OT eval Continue memantine Delirium precaution  MRSE UTI Currently afebrile, with resolving leukocytosis UA positive for UTI UC growing Staph epidermidis >100,000 BC x2 NGTD S/p ceftriaxone, switched to p.o. ciprofloxacin for a total of 7 days  AKI on CKD stage IIIa Improving Creatinine 1.34 on admission, trended up and peaked to 2.4 Possible ATN given frequent hypotensive episodes VS UTI Renal ultrasound was negative for hydronephrosis Continue to hold ARB BMP  Acute on chronic lower back pain DG lumbar spine showed degenerative changes basically worse at L4-L5 Pain management, PT/OT  Proctitis History of constipation CT abdomen/pelvis showed rectal wall thickening with perirectal fat stranding suggesting proctitis, moderate amount of retained stool in the colon GI consulted, plan for colonoscopy on 05/24/22 Bowel  regimen  Hyperkalemia-resolved Potassium trended up to 6.6, now stable Repeat BMP  Hypertension BP now uncontrolled Restart amlodipine, continue to hold losartan  Chronic diastolic CHF Currently compensated Losartan on hold because of AKI  BPH Continue Proscar and Flomax Outpatient follow-up with urology  Seizure disorder Continue Depakote Outpatient follow-up with neurology  Goals of care Palliative care consultation for goals of care discussion Full scope of care for now     DVT prophylaxis: Heparin subcutaneous Code Status: Full Family Communication: Discussed with wife at bedside Disposition Plan: Status is: Inpatient Remains inpatient appropriate because: Of severity of illness   Consultants: Nephrology/palliative care  Procedures: None  Antimicrobials: Ciprofloxacin    Subjective: Continues to report back pain around his tailbone area.  Otherwise no new complaints.    Objective: Vitals:   05/22/22 2000 05/23/22 0417 05/23/22 0821 05/23/22 1536  BP: 131/84 130/80 138/80 (!) 148/87  Pulse: 94 85 92 98  Resp: '16 17 16 15  '$ Temp: (!) 97.5 F (36.4 C) 98.2 F (36.8 C) 99.4 F (37.4 C) 98.1 F (36.7 C)  TempSrc:  Oral Oral Oral  SpO2: 95% 98% 96% 97%  Weight:      Height:        Intake/Output Summary (Last 24 hours) at 05/23/2022 1907 Last data filed at 05/23/2022 1744 Gross per 24 hour  Intake 169.36 ml  Output 1850 ml  Net -1680.64 ml   Filed Weights   05/15/22 1820  Weight: 82.2 kg    Examination: General: NAD Cardiovascular: S1, S2 present Respiratory: CTAB Abdomen: Soft, nontender, nondistended, bowel sounds present Musculoskeletal: No bilateral pedal edema noted Skin: Normal Psychiatry: Normal mood     Data Reviewed: I have personally reviewed following labs and imaging studies  CBC: Recent Labs  Lab 05/19/22 0351 05/20/22 0647 05/21/22 0736 05/22/22 0509 05/23/22 0344  WBC 18.3* 13.1* 12.2* 10.6* 10.1   NEUTROABS 15.3* 9.2* 7.8* 6.8 6.4  HGB 11.9* 12.7* 12.4* 12.3* 12.7*  HCT 33.5* 35.9* 35.8* 35.5* 37.6*  MCV 80.5 80.3 81.4 80.9 82.3  PLT 189 194 206 159 852   Basic Metabolic Panel: Recent Labs  Lab 05/19/22 0351 05/20/22 0647 05/21/22 0736 05/22/22 0509 05/23/22 0344  NA 137 134* 137 137 141  K 4.3 3.6 3.7 4.1 4.0  CL 105 103 102 99 102  CO2 '27 27 27 26 30  '$ GLUCOSE 109* 102* 101* 112* 124*  BUN '13 12 15 18 17  '$ CREATININE 1.33* 1.27* 1.33* 1.40* 1.49*  CALCIUM 8.6* 8.8* 8.9 9.0 9.0  PHOS 3.2 3.5 4.1 3.8 4.0   GFR: Estimated Creatinine Clearance: 40.6 mL/min (A) (by C-G formula based on SCr of 1.49 mg/dL (H)). Liver Function Tests: Recent Labs  Lab 05/17/22 0322 05/18/22 0401 05/19/22 0351 05/20/22 0647 05/21/22 0736 05/22/22 0509 05/23/22 0344  AST 26  --   --   --   --   --   --   ALT 18  --   --   --   --   --   --   ALKPHOS 39  --   --   --   --   --   --   BILITOT 0.5  --   --   --   --   --   --   PROT 5.6*  --   --   --   --   --   --   ALBUMIN 2.6*   < > 2.5* 2.7* 2.6* 2.6* 2.7*   < > = values in this interval not displayed.   No results for input(s): "LIPASE", "AMYLASE" in the last 168 hours. Recent Labs  Lab 05/17/22 0322  AMMONIA 16   Coagulation Profile: No results for input(s): "INR", "PROTIME" in the last 168 hours. Cardiac Enzymes: No results for input(s): "CKTOTAL", "CKMB", "CKMBINDEX", "TROPONINI" in the last 168 hours. BNP (last 3 results) No results for input(s): "PROBNP" in the last 8760 hours. HbA1C: No results for input(s): "HGBA1C" in the last 72 hours. CBG: No results for input(s): "GLUCAP" in the last 168 hours.  Lipid Profile: No results for input(s): "CHOL", "HDL", "LDLCALC", "TRIG", "CHOLHDL", "LDLDIRECT" in the last 72 hours. Thyroid Function Tests: No results for input(s): "TSH", "T4TOTAL", "FREET4", "T3FREE", "THYROIDAB" in the last 72 hours.  Anemia Panel: No results for input(s): "VITAMINB12", "FOLATE", "FERRITIN",  "TIBC", "IRON", "RETICCTPCT" in the last 72 hours.  Sepsis Labs: No results for input(s): "PROCALCITON", "LATICACIDVEN" in the last 168 hours.  Recent Results (from the past 240 hour(s))  Urine Culture     Status: Abnormal   Collection Time: 05/16/22  7:22 AM   Specimen: Urine, Clean Catch  Result Value Ref Range Status   Specimen Description URINE, CLEAN CATCH  Final   Special Requests   Final    NONE Performed at [facility], [address] [place], [place] [address]    Culture >=100,000 COLONIES/mL STAPHYLOCOCCUS EPIDERMIDIS (A)  Final   Report Status 05/18/2022 FINAL  Final   Organism ID, Bacteria STAPHYLOCOCCUS EPIDERMIDIS (A)  Final      Susceptibility   Staphylococcus epidermidis - MIC*    CIPROFLOXACIN <=0.5 SENSITIVE Sensitive     GENTAMICIN <=0.5 SENSITIVE Sensitive     NITROFURANTOIN <=16 SENSITIVE Sensitive  OXACILLIN >=4 RESISTANT Resistant     TETRACYCLINE 2 SENSITIVE Sensitive     VANCOMYCIN 2 SENSITIVE Sensitive     TRIMETH/SULFA 20 SENSITIVE Sensitive     CLINDAMYCIN <=0.25 SENSITIVE Sensitive     RIFAMPIN <=0.5 SENSITIVE Sensitive     Inducible Clindamycin NEGATIVE Sensitive     * >=100,000 COLONIES/mL STAPHYLOCOCCUS EPIDERMIDIS  Culture, blood (Routine X 2) w Reflex to ID Panel     Status: None   Collection Time: 05/16/22  7:38 AM   Specimen: BLOOD  Result Value Ref Range Status   Specimen Description BLOOD RIGHT ANTECUBITAL  Final   Special Requests   Final    BOTTLES DRAWN AEROBIC AND ANAEROBIC Blood Culture adequate volume   Culture   Final    NO GROWTH 5 DAYS Performed at [facility], [address] [place], [place] [address]    Report Status 05/21/2022 FINAL  Final  Culture, blood (Routine X 2) w Reflex to ID Panel     Status: None   Collection Time: 05/16/22  7:41 AM   Specimen: BLOOD LEFT ARM  Result Value Ref Range Status   Specimen Description BLOOD LEFT ARM  Final   Special Requests   Final    BOTTLES DRAWN AEROBIC AND  ANAEROBIC Blood Culture adequate volume   Culture   Final    NO GROWTH 5 DAYS Performed at [facility], [place] [address] [place], [place] [address]    Report Status 05/21/2022 FINAL  Final         Radiology Studies: DG Lumbar Spine 2-3 Views  Result Date: 05/22/2022 CLINICAL DATA:  Mid sagittal back pain EXAM: LUMBAR SPINE - 2-3 VIEW COMPARISON:  CT 05/21/2022 FINDINGS: Levocurvature. Vertebral body heights are maintained. Moderate disc space narrowing L3-L4 and L5-S1 with advanced degenerative changes at L4-L5. Facet degenerative changes of the lower lumbar spine. IMPRESSION: Degenerative changes, worst at L4-L5 Electronically Signed   By: Donavan Foil M.D.   On: 05/22/2022 18:03   CT ABDOMEN PELVIS W CONTRAST  Result Date: 05/21/2022 CLINICAL DATA:  Bowel obstruction suspected. EXAM: CT ABDOMEN AND PELVIS WITH CONTRAST TECHNIQUE: Multidetector CT imaging of the abdomen and pelvis was performed using the standard protocol following bolus administration of intravenous contrast. RADIATION DOSE REDUCTION: This exam was performed according to the departmental dose-optimization program which includes automated exposure control, adjustment of the mA and/or kV according to patient size and/or use of iterative reconstruction technique. CONTRAST:  50m OMNIPAQUE IOHEXOL 350 MG/ML SOLN COMPARISON:  08/26/2021. FINDINGS: Lower chest: Coronary artery calcifications are noted. Atelectasis is noted at the lung bases bilaterally. There is bronchiectasis in the right lower lobe. There is a 5 mm nodular density in the right middle lobe, axial image 23. Hepatobiliary: No focal liver abnormality is seen. No gallstones, gallbladder wall thickening, or biliary dilatation. Pancreas: Unremarkable. No pancreatic ductal dilatation or surrounding inflammatory changes. Spleen: Normal in size without focal abnormality. Adrenals/Urinary Tract: Thickening of the bilateral adrenal glands with no evidence of discrete  nodule. The kidneys enhance symmetrically. Cysts are present in the kidneys bilaterally. Subcentimeter hypodensities are noted bilaterally which are too small to further characterize. No renal calculus or hydronephrosis. The bladder is unremarkable. Stomach/Bowel: Stomach is within normal limits. Appendix is not well seen. No bowel obstruction, free air, or pneumatosis. Air-fluid levels are noted in the rectum with rectal wall thickening and perirectal fat stranding and edema. A moderate amount of retained stool is present in the colon. Vascular/Lymphatic: Aortic  atherosclerosis. No enlarged abdominal or pelvic lymph nodes. Reproductive: Prostate is unremarkable. Other: No abdominopelvic ascites. Musculoskeletal: Degenerative changes are present in the thoracolumbar spine. No acute or suspicious osseous abnormality. IMPRESSION: 1. No bowel obstruction or free air. 2. Rectal wall thickening with perirectal fat stranding and edema suggesting proctitis. 3. Moderate amount of retained stool in the colon, possible constipation. 4. Bilateral renal cysts. 5. Aortic atherosclerosis. Electronically Signed   By: Brett Fairy M.D.   On: 05/21/2022 23:29        Scheduled Meds:  amLODipine  10 mg Oral Daily   aspirin  81 mg Oral Daily   ciprofloxacin  500 mg Oral BID   divalproex  500 mg Oral QHS   docusate sodium  100 mg Oral Daily   finasteride  5 mg Oral Daily   latanoprost  1 drop Both Eyes QHS   linaclotide  145 mcg Oral QAC breakfast   memantine  10 mg Oral BID   metoCLOPramide (REGLAN) injection  10 mg Intravenous Q6H   mirabegron ER  25 mg Oral Daily   rosuvastatin  20 mg Oral Daily   senna-docusate  1 tablet Oral QHS   tamsulosin  0.4 mg Oral Daily   Continuous Infusions:  sodium chloride 20 mL/hr at 05/23/22 0408           [place] Friendly, MD [facility] 05/23/2022, 7:07 PM

## 2022-05-23 NOTE — Progress Notes (Addendum)
Pt passing soft brown stool this am.   Labs and vitals reviewed: Stable  DX: worsening constipation, ? proctitis on CT, anemia, possible history of colon polyps  Plan:  Reschedule colonoscopy for tmrw at 0930 Orders for dulcolax, split movi prep, reglan written.    Azucena Freed PA-C

## 2022-05-23 NOTE — Progress Notes (Signed)
This chaplain responded to RN-Michelle Stubblefield's consult for creating/directing the Pt. Advance Directive: HCPOA.  The Pt. wife-[name] is at the bedside during the chaplain's visit.  The chaplain provided AD education and asked the Pt. clarifying questions on the role of the Pt. HCPOA. At this time the Pt. is unable to correctly verbalize the role of an HCPOA. The chaplain understands the Pt. is hoping to name his wife-[name] HCPOA and his daughter as the second person. The chaplain affirmed Lelon Frohlich is the patient's healthcare decision maker as his next of kin without documentation. The chaplain agreed to revisit on Wednesday in coordination with the Pt. procedure.  The chaplain left the blank document with the Pt.  Chaplain Sallyanne Kuster [phone]

## 2022-05-23 NOTE — TOC Progression Note (Addendum)
Transition of Care (TOC) - Progression Note    Patient Details  Name: Eric Barrett. MRN: [id number] Date of Birth: 10/01/1940  Transition of Care [facility]) CM/SW Contact  Curlene Labrum, RN Phone Number: 05/23/2022, 9:39 AM  Clinical Narrative:    CM met with the patient's wife at the bedside and she requests assistance with [facility] documents.  Chaplain consult referral placed.  HCPO paperwork provided to the patient's wife before chaplain comes to meet with the patient.  I called [facility] and the patient has VA benefits that are non-service connected.  [name] wife was given contact information to follow up with Baldwin [place], MSW with [facility] to contact once the patient is admitted to [facility] in [facility] to follow up to see if he qualifies for home health aide.  Donna Christen, MSW with [facility] today states that the patient's income will likely not provide for home health aide services since his income is too high.  [name] wife was updated.  The patient has pending GI test for colonoscopy.  CM will continue to follow the patient for discharge to SNF at [facility] in [facility] - once medically stable.   Expected Discharge Plan: [place] Barriers to Discharge: Continued Medical Work up  Expected Discharge Plan and Services Expected Discharge Plan: [place]   Discharge Planning Services: CM Consult Post Acute Care Choice: [place] Living arrangements for the past 2 months: Apartment                                       Social Determinants of Health (SDOH) Interventions Housing Interventions: Intervention Not Indicated  Readmission Risk Interventions    05/17/2022    4:04 PM  Readmission Risk Prevention Plan  Transportation Screening Complete  PCP or Specialist Appt within 5-7 Days Complete  Home Care Screening Complete  Medication Review (RN CM) Complete

## 2022-05-24 ENCOUNTER — Inpatient Hospital Stay (HOSPITAL_COMMUNITY): Payer: Medicare HMO | Admitting: Certified Registered Nurse Anesthetist

## 2022-05-24 ENCOUNTER — Encounter (HOSPITAL_COMMUNITY): Payer: Self-pay | Admitting: Internal Medicine

## 2022-05-24 ENCOUNTER — Encounter (HOSPITAL_COMMUNITY)
Admission: EM | Disposition: A | Discharge status: Skilled Nursing Facility | Payer: Self-pay | Source: Home / Self Care | Attending: Internal Medicine

## 2022-05-24 DIAGNOSIS — K5900 Constipation, unspecified: Secondary | ICD-10-CM

## 2022-05-24 DIAGNOSIS — Z87891 Personal history of nicotine dependence: Secondary | ICD-10-CM

## 2022-05-24 DIAGNOSIS — D125 Benign neoplasm of sigmoid colon: Secondary | ICD-10-CM

## 2022-05-24 DIAGNOSIS — R194 Change in bowel habit: Secondary | ICD-10-CM

## 2022-05-24 DIAGNOSIS — D126 Benign neoplasm of colon, unspecified: Secondary | ICD-10-CM

## 2022-05-24 DIAGNOSIS — R933 Abnormal findings on diagnostic imaging of other parts of digestive tract: Secondary | ICD-10-CM

## 2022-05-24 DIAGNOSIS — I10 Essential (primary) hypertension: Secondary | ICD-10-CM

## 2022-05-24 DIAGNOSIS — K640 First degree hemorrhoids: Secondary | ICD-10-CM

## 2022-05-24 DIAGNOSIS — K6289 Other specified diseases of anus and rectum: Secondary | ICD-10-CM

## 2022-05-24 DIAGNOSIS — I6990 Unspecified sequelae of unspecified cerebrovascular disease: Secondary | ICD-10-CM

## 2022-05-24 HISTORY — PX: COLONOSCOPY WITH PROPOFOL: SHX5780

## 2022-05-24 HISTORY — PX: POLYPECTOMY: SHX5525

## 2022-05-24 LAB — CBC WITH DIFFERENTIAL/PLATELET
Abs Immature Granulocytes: 0.05 10*3/uL (ref 0.00–0.07)
Basophils Absolute: 0 10*3/uL (ref 0.0–0.1)
Basophils Relative: 1 %
Eosinophils Absolute: 0.1 10*3/uL (ref 0.0–0.5)
Eosinophils Relative: 1 %
HCT: 34.7 % — ABNORMAL LOW (ref 39.0–52.0)
Hemoglobin: 11.8 g/dL — ABNORMAL LOW (ref 13.0–17.0)
Immature Granulocytes: 1 %
Lymphocytes Relative: 19 %
Lymphs Abs: 1.7 10*3/uL (ref 0.7–4.0)
MCH: 28 pg (ref 26.0–34.0)
MCHC: 34 g/dL (ref 30.0–36.0)
MCV: 82.2 fL (ref 80.0–100.0)
Monocytes Absolute: 1 10*3/uL (ref 0.1–1.0)
Monocytes Relative: 11 %
Neutro Abs: 6.1 10*3/uL (ref 1.7–7.7)
Neutrophils Relative %: 67 %
Platelets: 250 10*3/uL (ref 150–400)
RBC: 4.22 MIL/uL (ref 4.22–5.81)
RDW: 14.6 % (ref 11.5–15.5)
WBC: 8.9 10*3/uL (ref 4.0–10.5)
nRBC: 0 % (ref 0.0–0.2)

## 2022-05-24 LAB — RENAL FUNCTION PANEL
Albumin: 2.4 g/dL — ABNORMAL LOW (ref 3.5–5.0)
Anion gap: 6 (ref 5–15)
BUN: 9 mg/dL (ref 8–23)
CO2: 28 mmol/L (ref 22–32)
Calcium: 8.8 mg/dL — ABNORMAL LOW (ref 8.9–10.3)
Chloride: 108 mmol/L (ref 98–111)
Creatinine, Ser: 1.31 mg/dL — ABNORMAL HIGH (ref 0.61–1.24)
GFR, Estimated: 55 mL/min — ABNORMAL LOW (ref >60)
Glucose, Bld: 124 mg/dL — ABNORMAL HIGH (ref 70–99)
Phosphorus: 3.2 mg/dL (ref 2.5–4.6)
Potassium: 3.5 mmol/L (ref 3.5–5.1)
Sodium: 142 mmol/L (ref 135–145)

## 2022-05-24 SURGERY — COLONOSCOPY WITH PROPOFOL
Anesthesia: Monitor Anesthesia Care

## 2022-05-24 MED ORDER — PHENYLEPHRINE 80 MCG/ML (10ML) SYRINGE FOR IV PUSH (FOR BLOOD PRESSURE SUPPORT)
PREFILLED_SYRINGE | INTRAVENOUS | Status: DC | PRN
  Administered 2022-05-24: 80 ug via INTRAVENOUS
  Administered 2022-05-24: 160 ug via INTRAVENOUS

## 2022-05-24 MED ORDER — LIDOCAINE HCL (CARDIAC) PF 100 MG/5ML IV SOSY
PREFILLED_SYRINGE | INTRAVENOUS | Status: DC | PRN
  Administered 2022-05-24: 40 mg via INTRAVENOUS

## 2022-05-24 MED ORDER — SENNA 8.6 MG PO TABS: 1.0000 | ORAL_TABLET | Freq: Every day | ORAL | 0 refills | Status: DC

## 2022-05-24 MED ORDER — CIPROFLOXACIN HCL 500 MG PO TABS: 500.0000 mg | ORAL_TABLET | Freq: Two times a day (BID) | ORAL | 0 refills | Status: DC

## 2022-05-24 MED ORDER — ACETAMINOPHEN 325 MG PO TABS: 650.0000 mg | ORAL_TABLET | Freq: Four times a day (QID) | ORAL | Status: DC | PRN

## 2022-05-24 MED ORDER — PROPOFOL 500 MG/50ML IV EMUL
INTRAVENOUS | Status: DC | PRN
  Administered 2022-05-24: 100 ug/kg/min via INTRAVENOUS

## 2022-05-24 MED ORDER — LACTATED RINGERS IV SOLN: INTRAVENOUS | Status: DC | PRN

## 2022-05-24 MED ORDER — LACTATED RINGERS IV SOLN: INTRAVENOUS | Status: DC

## 2022-05-24 MED ORDER — POLYETHYLENE GLYCOL 3350 17 G PO PACK
17.0000 g | PACK | Freq: Every day | ORAL | Status: DC
  Filled 2022-05-24: qty 1

## 2022-05-24 SURGICAL SUPPLY — 22 items

## 2022-05-24 NOTE — TOC Transition Note (Addendum)
Transition of Care [facility]) - CM/SW Discharge Note   Patient Details  Name: Eric Barrett. MRN: [id number] Date of Birth: 1941-03-25  Transition of Care [facility]) CM/SW Contact:  Curlene Labrum, RN Phone Number: 05/24/2022, 3:00 PM   Clinical Narrative:    The patient is medically stable to discharge to [facility] in  today.  I spoke with [place], [place] with the facility and the insurance authorization is approved and bed is available.  I spoke with the patient and wife at the bedside and the patient will discharge to the facility today.  Discharge summary and orders were updated in the hub.  [facility] transportation will be coordinated for discharge to the facility.  I spoke with the patient's wife and palliative care referral was recommended.  I spoke with the patient's wife and gave her Medicare choice regarding Palliative care and she did not have a preference.  I called [facility] and Palliative outpatient referral was placed.  Bedside nursing - please call report to [facility] in [place] at [phone] Room 124 a.   Final next level of care: Skilled Nursing Facility Barriers to Discharge: Continued Medical Work up   Patient Goals and CMS Choice Patient states their goals for this hospitalization and ongoing recovery are:: to go to rehab before going home with wife CMS Medicare.gov Compare Post Acute Care list provided to:: Patient Represenative (must comment) (wife) Choice offered to / list presented to : Spouse  Discharge Placement                       Discharge Plan and Services   Discharge Planning Services: CM Consult Post Acute Care Choice: [place]                               Social Determinants of Health (SDOH) Interventions Housing Interventions: Intervention Not Indicated   Readmission Risk Interventions    05/17/2022    4:04 PM  Readmission Risk Prevention Plan  Transportation Screening Complete   PCP or Specialist Appt within 5-7 Days Complete  Home Care Screening Complete  Medication Review (RN CM) Complete

## 2022-05-24 NOTE — Progress Notes (Signed)
Mobility Specialist Progress Note:   05/24/22 1431  Mobility  Activity Dangled on edge of bed  Level of Assistance Minimal assist, patient does 75% or more  Assistive Device Other (Comment) (Bed Rails)  Activity Response Tolerated fair  Mobility Referral Yes  $Mobility charge 1 Mobility   Pt received in bed and agreeable. C/o of generalized weakness and increased trembling. Deferred mobility to a later time d/t increased trembling upon sitting. Will f/u as schedule allows.   Wah Sabic Mobility Specialist-Acute Rehab Secure Chat only

## 2022-05-24 NOTE — Progress Notes (Signed)
Mobility Specialist Progress Note:   05/24/22 1505  Mobility  Activity Stood at bedside  Level of Assistance Minimal assist, patient does 75% or more  Assistive Device Front wheel walker  Activity Response Tolerated fair  Mobility Referral Yes  $Mobility charge 1 Mobility   Pt received in bed and eager. Increased trembling upon sitting and then standing, worsened with prolonged stand. Pt left in bed with all needs met, call bell in reach, and family in room.   Teo Moede Mobility Specialist-Acute Rehab Secure Chat only

## 2022-05-24 NOTE — Anesthesia Postprocedure Evaluation (Signed)
Anesthesia Post Note  Patient: Eric Barrett.  Procedure(s) Performed: COLONOSCOPY WITH PROPOFOL POLYPECTOMY     Patient location during evaluation: PACU Anesthesia Type: MAC Level of consciousness: awake and alert Pain management: pain level controlled Vital Signs Assessment: post-procedure vital signs reviewed and stable Respiratory status: spontaneous breathing, nonlabored ventilation, respiratory function stable and patient connected to nasal cannula oxygen Cardiovascular status: stable and blood pressure returned to baseline Postop Assessment: no apparent nausea or vomiting Anesthetic complications: no  No notable events documented.  Last Vitals:  Vitals:   05/24/22 0903 05/24/22 1000  BP: (!) 149/88 127/76  Pulse: 94 82  Resp: 20 (!) 21  Temp: 36.7 C 36.8 C  SpO2: 95% 99%    Last Pain:  Vitals:   05/24/22 1000  TempSrc:   PainSc: Asleep                 Klayton Monie

## 2022-05-24 NOTE — Progress Notes (Signed)
This chaplain is present for spiritual care revisit in the space of creating/updating the Pt. Advance Directive. The chaplain started AD education with the Pt. and the Pt. wife-[name] on Tuesday. The chaplain understands Lelon Frohlich is the Pt. surrogate decision maker following [place] Hierarchy of decision makers.  The chaplain reviewed HCPOA education and asked the Pt. clarifying questions. The Pt. was not able to distinguish between the roles of HCPOA and Durable Power of [place]. The chaplain provided education on how to complete the AD outside of the hospital with the Pt. and Pt. wife-[name].  This chaplain is available for F/U spiritual care as needed.  Chaplain Sallyanne Kuster [phone]

## 2022-05-24 NOTE — Interval H&P Note (Signed)
History and Physical Interval Note:  05/24/2022 9:21 AM  Eric Barrett.  has presented today for surgery, with the diagnosis of acute constipation, proctitis, weight loss.  The various methods of treatment have been discussed with the patient and family. After consideration of risks, benefits and other options for treatment, the patient has consented to  Procedure(s): COLONOSCOPY WITH PROPOFOL (N/A) as a surgical intervention.  [name] history has been reviewed, patient examined, no change in status, stable for surgery.  I have reviewed the patient's chart and labs.  Questions were answered to the patient's satisfaction.     Pricilla Riffle. Fuller Plan

## 2022-05-24 NOTE — Discharge Instructions (Signed)
Follow with Primary MD [name], FNP/SNF MD  Get CBC, CMP,  checked  by Primary MD next visit.    Activity: As tolerated with Full fall precautions use walker/cane & assistance as needed   Disposition Home    Diet: Heart Healthy   On your next visit with your primary care physician please Get Medicines reviewed and adjusted.   Please request your Prim.MD to go over all Hospital Tests and Procedure/Radiological results at the follow up, please get all Hospital records sent to your Prim MD by signing hospital release before you go home.   If you experience worsening of your admission symptoms, develop shortness of breath, life threatening emergency, suicidal or homicidal thoughts you must seek medical attention immediately by calling 911 or calling your MD immediately  if symptoms less severe.  You Must read complete instructions/literature along with all the possible adverse reactions/side effects for all the Medicines you take and that have been prescribed to you. Take any new Medicines after you have completely understood and accpet all the possible adverse reactions/side effects.   Do not drive, operating heavy machinery, perform activities at heights, swimming or participation in water activities or provide baby sitting services if your were admitted for syncope or siezures until you have seen by Primary MD or a Neurologist and advised to do so again.  Do not drive when taking Pain medications.    Do not take more than prescribed Pain, Sleep and Anxiety Medications  Special Instructions: If you have smoked or chewed Tobacco  in the last 2 yrs please stop smoking, stop any regular Alcohol  and or any Recreational drug use.  Wear Seat belts while driving.   Please note  You were cared for by a hospitalist during your hospital stay. If you have any questions about your discharge medications or the care you received while you were in the hospital after you are  discharged, you can call the unit and asked to speak with the hospitalist on call if the hospitalist that took care of you is not available. Once you are discharged, your primary care physician will handle any further medical issues. Please note that NO REFILLS for any discharge medications will be authorized once you are discharged, as it is imperative that you return to your primary care physician (or establish a relationship with a primary care physician if you do not have one) for your aftercare needs so that they can reassess your need for medications and monitor your lab values.

## 2022-05-24 NOTE — Progress Notes (Signed)
[facility]) Hospital Liaison note  Notified by Mia Creek, RN with [facility] of patient/family request for [facility] Palliative Services at discharge.  Please call with any questions.  Thank you for the opportunity to participate in this patients care.  Margaretmary Eddy, BSN, RN [facility] Liaison [phone]

## 2022-05-24 NOTE — Progress Notes (Signed)
PROGRESS NOTE    Eric Barrett.  HKV:[id number] DOB: 04/15/1941 DOA: 05/15/2022 PCP: [name], FNP   Brief Narrative:   81 y.o. male with medical history significant of Alzheimer's dementia, HTN, HLD, chronic HFpEF, remote stroke with residual left-sided weakness, glaucoma, seizure disorder presented with altered mental status and dysuria.  Apparently, patient had been drinking 6 to 8 -16 ounces bottles of water recently.  He saw his PCP 4 days ago, blood work showed sodium of 122, UA showed hematuria and pyuria, p.o. antibiotics was ordered however patient had not started antibiotics yet.  Patient presented to the ED more confused and sodium was 123, UA showing hematuria and pyuria again.  He was started on Rocephin and admitted for further management.    Assessment & Plan:   Acute on chronic hyponatremia Likely secondary to poor solute intake Vs SIADH Nephrology consulted, appreciate recs Significantly improved with IV fluids, sodium back to baseline.  Acute metabolic encephalopathy-improving History of Alzheimer's dementia Possibly from UTI and hyponatremia PT/OT eval Continue memantine Delirium precaution  MRSE UTI Currently afebrile, with resolving leukocytosis UA positive for UTI UC growing Staph epidermidis >100,000 BC x2 NGTD S/p ceftriaxone, switched to p.o. ciprofloxacin for a total of 7 days  Proctitis History of constipation CT abdomen/pelvis showed rectal wall thickening with perirectal fat stranding suggesting proctitis, moderate amount of retained stool in the colon GI consulted, plan for colonoscopy this morning, significant for polyp which was snared, otherwise no acute findings. -Resumed on heart healthy diet today   AKI on CKD stage IIIa Improving Creatinine 1.34 on admission, trended up and peaked to 2.4 Possible ATN given frequent hypotensive episodes VS UTI Renal ultrasound was negative for hydronephrosis Continue to hold  ARB BMP  Acute on chronic lower back pain DG lumbar spine showed degenerative changes basically worse at L4-L5 Pain management, PT/OT  Hyperkalemia-resolved Potassium trended up to 6.6, now stable Repeat BMP  Hypertension BP now uncontrolled Restart amlodipine, continue to hold losartan  Chronic diastolic CHF Currently compensated Losartan on hold because of AKI  BPH Continue Proscar and Flomax Outpatient follow-up with urology  Seizure disorder Continue Depakote Outpatient follow-up with neurology  Goals of care Palliative care consultation for goals of care discussion Full scope of care for now     DVT prophylaxis: Heparin subcutaneous Code Status: Full Family Communication: None at bedside Disposition Plan: Status is: Inpatient Remains inpatient appropriate because: Of severity of illness   Consultants: Nephrology/palliative care  Procedures: None  Antimicrobials: Ciprofloxacin    Subjective:  No significant events overnight, he denies any complaints today, he reports multiple loose BM yesterday due to bowel prep.  Objective: Vitals:   05/24/22 0737 05/24/22 0903 05/24/22 1000 05/24/22 1015  BP: 134/83 (!) 149/88 127/76 139/83  Pulse: 87 94 82 79  Resp: 15 20 (!) 21 19  Temp: 98.5 F (36.9 C) 98.1 F (36.7 C) 98.2 F (36.8 C) 98.2 F (36.8 C)  TempSrc: Oral Temporal    SpO2: 97% 95% 99% 96%  Weight:      Height:        Intake/Output Summary (Last 24 hours) at 05/24/2022 1053 Last data filed at 05/24/2022 1000 Gross per 24 hour  Intake 896.27 ml  Output 2150 ml  Net -1253.73 ml   Filed Weights   05/15/22 1820  Weight: 82.2 kg    Examination:  Awake Alert, Oriented X 3, No new F.N deficits, Normal affect Symmetrical Chest wall movement, Good air movement bilaterally,  CTAB RRR,No Gallops,Rubs or new Murmurs, No Parasternal Heave +ve B.Sounds, Abd Soft, No tenderness, No rebound - guarding or rigidity. No Cyanosis, Clubbing or  edema, No new Rash or bruise      Data Reviewed: I have personally reviewed following labs and imaging studies  CBC: Recent Labs  Lab 05/20/22 0647 05/21/22 0736 05/22/22 0509 05/23/22 0344 05/24/22 0425  WBC 13.1* 12.2* 10.6* 10.1 8.9  NEUTROABS 9.2* 7.8* 6.8 6.4 6.1  HGB 12.7* 12.4* 12.3* 12.7* 11.8*  HCT 35.9* 35.8* 35.5* 37.6* 34.7*  MCV 80.3 81.4 80.9 82.3 82.2  PLT 194 206 159 259 272   Basic Metabolic Panel: Recent Labs  Lab 05/20/22 0647 05/21/22 0736 05/22/22 0509 05/23/22 0344 05/24/22 0425  NA 134* 137 137 141 142  K 3.6 3.7 4.1 4.0 3.5  CL 103 102 99 102 108  CO2 '27 27 26 30 28  '$ GLUCOSE 102* 101* 112* 124* 124*  BUN '12 15 18 17 9  '$ CREATININE 1.27* 1.33* 1.40* 1.49* 1.31*  CALCIUM 8.8* 8.9 9.0 9.0 8.8*  PHOS 3.5 4.1 3.8 4.0 3.2   GFR: Estimated Creatinine Clearance: 46.2 mL/min (A) (by C-G formula based on SCr of 1.31 mg/dL (H)). Liver Function Tests: Recent Labs  Lab 05/20/22 0647 05/21/22 0736 05/22/22 0509 05/23/22 0344 05/24/22 0425  ALBUMIN 2.7* 2.6* 2.6* 2.7* 2.4*   No results for input(s): "LIPASE", "AMYLASE" in the last 168 hours. No results for input(s): "AMMONIA" in the last 168 hours.  Coagulation Profile: No results for input(s): "INR", "PROTIME" in the last 168 hours. Cardiac Enzymes: No results for input(s): "CKTOTAL", "CKMB", "CKMBINDEX", "TROPONINI" in the last 168 hours. BNP (last 3 results) No results for input(s): "PROBNP" in the last 8760 hours. HbA1C: No results for input(s): "HGBA1C" in the last 72 hours. CBG: No results for input(s): "GLUCAP" in the last 168 hours.  Lipid Profile: No results for input(s): "CHOL", "HDL", "LDLCALC", "TRIG", "CHOLHDL", "LDLDIRECT" in the last 72 hours. Thyroid Function Tests: No results for input(s): "TSH", "T4TOTAL", "FREET4", "T3FREE", "THYROIDAB" in the last 72 hours.  Anemia Panel: No results for input(s): "VITAMINB12", "FOLATE", "FERRITIN", "TIBC", "IRON", "RETICCTPCT" in the  last 72 hours.  Sepsis Labs: No results for input(s): "PROCALCITON", "LATICACIDVEN" in the last 168 hours.  Recent Results (from the past 240 hour(s))  Urine Culture     Status: Abnormal   Collection Time: 05/16/22  7:22 AM   Specimen: Urine, Clean Catch  Result Value Ref Range Status   Specimen Description URINE, CLEAN CATCH  Final   Special Requests   Final    NONE Performed at [facility], [address] [place], [place] [address]    Culture >=100,000 COLONIES/mL STAPHYLOCOCCUS EPIDERMIDIS (A)  Final   Report Status 05/18/2022 FINAL  Final   Organism ID, Bacteria STAPHYLOCOCCUS EPIDERMIDIS (A)  Final      Susceptibility   Staphylococcus epidermidis - MIC*    CIPROFLOXACIN <=0.5 SENSITIVE Sensitive     GENTAMICIN <=0.5 SENSITIVE Sensitive     NITROFURANTOIN <=16 SENSITIVE Sensitive     OXACILLIN >=4 RESISTANT Resistant     TETRACYCLINE 2 SENSITIVE Sensitive     VANCOMYCIN 2 SENSITIVE Sensitive     TRIMETH/SULFA 20 SENSITIVE Sensitive     CLINDAMYCIN <=0.25 SENSITIVE Sensitive     RIFAMPIN <=0.5 SENSITIVE Sensitive     Inducible Clindamycin NEGATIVE Sensitive     * >=100,000 COLONIES/mL STAPHYLOCOCCUS EPIDERMIDIS  Culture, blood (Routine X 2) w Reflex to ID Panel  Status: None   Collection Time: 05/16/22  7:38 AM   Specimen: BLOOD  Result Value Ref Range Status   Specimen Description BLOOD RIGHT ANTECUBITAL  Final   Special Requests   Final    BOTTLES DRAWN AEROBIC AND ANAEROBIC Blood Culture adequate volume   Culture   Final    NO GROWTH 5 DAYS Performed at [facility], [address], [place], [place] [address]    Report Status 05/21/2022 FINAL  Final  Culture, blood (Routine X 2) w Reflex to ID Panel     Status: None   Collection Time: 05/16/22  7:41 AM   Specimen: BLOOD LEFT ARM  Result Value Ref Range Status   Specimen Description BLOOD LEFT ARM  Final   Special Requests   Final    BOTTLES DRAWN AEROBIC AND ANAEROBIC Blood Culture adequate  volume   Culture   Final    NO GROWTH 5 DAYS Performed at [facility], [place] [address] [place], [place] [address]    Report Status 05/21/2022 FINAL  Final         Radiology Studies: DG Lumbar Spine 2-3 Views  Result Date: 05/22/2022 CLINICAL DATA:  Mid sagittal back pain EXAM: LUMBAR SPINE - 2-3 VIEW COMPARISON:  CT 05/21/2022 FINDINGS: Levocurvature. Vertebral body heights are maintained. Moderate disc space narrowing L3-L4 and L5-S1 with advanced degenerative changes at L4-L5. Facet degenerative changes of the lower lumbar spine. IMPRESSION: Degenerative changes, worst at L4-L5 Electronically Signed   By: Donavan Foil M.D.   On: 05/22/2022 18:03        Scheduled Meds:  [MAR Hold] amLODipine  10 mg Oral Daily   [MAR Hold] aspirin  81 mg Oral Daily   [MAR Hold] ciprofloxacin  500 mg Oral BID   [MAR Hold] divalproex  500 mg Oral QHS   [MAR Hold] docusate sodium  100 mg Oral Daily   [MAR Hold] finasteride  5 mg Oral Daily   [MAR Hold] latanoprost  1 drop Both Eyes QHS   [MAR Hold] linaclotide  145 mcg Oral QAC breakfast   [MAR Hold] memantine  10 mg Oral BID   [MAR Hold] mirabegron ER  25 mg Oral Daily   [START ON 05/25/2022] polyethylene glycol  17 g Oral Daily   [MAR Hold] rosuvastatin  20 mg Oral Daily   [MAR Hold] senna-docusate  1 tablet Oral QHS   [MAR Hold] tamsulosin  0.4 mg Oral Daily   Continuous Infusions:  sodium chloride 20 mL/hr at 05/23/22 2357   lactated ringers 10 mL/hr at 05/24/22 8756           Phillips Climes, MD [facility] 05/24/2022, 10:53 AM

## 2022-05-24 NOTE — Progress Notes (Signed)
Physical Therapy Treatment Patient Details Name: Eric Barrett. MRN: [id number] DOB: 09-14-1940 Today's Date: 05/24/2022   History of Present Illness 81 yo male presents to [facility] on 10/9 with hyponatremia, AMS, dysuria. PMH includes dementia, HTN, HLD, chronic HFpEF, remote stroke with residual left-sided weakness, glaucoma, seizure disorder.    PT Comments    Pt received in supine, spouse present, pt pleasantly agreeable to therapy session and with good participation and tolerance for transfer training, standing exercises and pre-gait training at bedside. Session time limited due to pt visitor arrival and pt requesting for time to meet with his pastor prior to arrival of PTAR transport for DC. Pt performed bed mobility and transfers with up to modA and multimodal cues needed to perform some tasks. Pt continues to benefit from PT services to progress toward functional mobility goals.    Recommendations for follow up therapy are one component of a multi-disciplinary discharge planning process, led by the attending physician.  Recommendations may be updated based on patient status, additional functional criteria and insurance authorization.  Follow Up Recommendations  Skilled nursing-short term rehab (<3 hours/day) Can patient physically be transported by private vehicle: Yes   Assistance Recommended at Discharge Frequent or constant Supervision/Assistance  Patient can return home with the following A lot of help with walking and/or transfers;A lot of help with bathing/dressing/bathroom;Help with stairs or ramp for entrance;Assist for transportation   Equipment Recommendations  None recommended by PT    Recommendations for Other Services       Precautions / Restrictions Precautions Precautions: Fall Precaution Comments: seizure precs Restrictions Weight Bearing Restrictions: No     Mobility  Bed Mobility Overal bed mobility: Needs Assistance Bed Mobility: Rolling, Sidelying to  Sit Rolling: Mod assist Sidelying to sit: Min assist       General bed mobility comments: cues for rolling to his L side but some difficulty with instruction and needs multimodal cues and bed pad assist to reach sidelying; minA for BLE guidance over EOB and trunk raising    Transfers Overall transfer level: Needs assistance Equipment used: Rolling walker (2 wheels) Transfers: Sit to/from Stand Sit to Stand: Min assist           General transfer comment: EOB<>RW x2 trials, cues for UE placement needed each trial, minA steadying upon standing and lift assist    Ambulation/Gait Ambulation/Gait assistance: Min assist   Assistive device: Rolling walker (2 wheels)       Pre-gait activities: hip flexion x10 reps and sidesteps along EOB; time limited due to arrival of pastor and pt requesting to visit with him and spouse.     Stairs             Wheelchair Mobility    Modified Rankin (Stroke Patients Only)       Balance Overall balance assessment: Needs assistance Sitting-balance support: Feet supported, No upper extremity supported Sitting balance-Leahy Scale: Good     Standing balance support: Bilateral upper extremity supported, During functional activity Standing balance-Leahy Scale: Fair Standing balance comment: fair static balance with RW needing up to minA for safety for dynamic tasks                            Cognition Arousal/Alertness: Awake/alert Behavior During Therapy: WFL for tasks assessed/performed Overall Cognitive Status: History of cognitive impairments - at baseline  General Comments: Pleasantly cooperative, following most simple commands well with multimodal cues.        Exercises Other Exercises Other Exercises: standing hip flexion x10 reps with RW Other Exercises: STS x 2 reps for BLE strengthening Other Exercises: standing mini squats and heel raises x10 reps ea     General Comments General comments (skin integrity, edema, etc.): x2 pair of briefs donned and purewick in bed, per spouse this is for long transport ride planned with PTAR for DC today      Pertinent Vitals/Pain Pain Assessment Pain Assessment: No/denies pain     PT Goals (current goals can now be found in the care plan section) Acute Rehab PT Goals Patient Stated Goal: to get better so I can go home PT Goal Formulation: With patient Time For Goal Achievement: 05/30/22 Progress towards PT goals: Progressing toward goals    Frequency    Min 2X/week      PT Plan Current plan remains appropriate       AM-PAC PT "6 Clicks" Mobility   Outcome Measure  Help needed turning from your back to your side while in a flat bed without using bedrails?: A Lot Help needed moving from lying on your back to sitting on the side of a flat bed without using bedrails?: A Little Help needed moving to and from a bed to a chair (including a wheelchair)?: A Little Help needed standing up from a chair using your arms (e.g., wheelchair or bedside chair)?: A Little Help needed to walk in hospital room?: Total (mild tremors/fatigue) Help needed climbing 3-5 steps with a railing? : Total 6 Click Score: 13    End of Session Equipment Utilized During Treatment: Gait belt Activity Tolerance: Patient tolerated treatment well Patient left: in bed;with call bell/phone within reach;with family/visitor present;Other (comment) (pt sitting EOB, pastor present and spouse present, bed alarm off in case anyone else sits on edge of bed, pt spouse to notify RN or PTA when assist needed to return to supine.) Nurse Communication: Mobility status;Other (comment) (may need assist once done visiting with his pastor, pt sitting EOB) PT Visit Diagnosis: Other abnormalities of gait and mobility (R26.89);Muscle weakness (generalized) (M62.81)     Time: 2585-2778 PT Time Calculation (min) (ACUTE ONLY): 13 min  Charges:   $Therapeutic Exercise: 8-22 mins                     Nabilah Davoli P., PTA Acute Rehabilitation Services Secure Chat Preferred 9a-5:30pm Office: [place] 05/24/2022, 4:14 PM

## 2022-05-24 NOTE — Anesthesia Procedure Notes (Signed)
Procedure Name: MAC Date/Time: 05/24/2022 9:27 AM  Performed by: Kyung Rudd, CRNAPre-anesthesia Checklist: Patient identified, Emergency Drugs available, Suction available and Patient being monitored Patient Re-evaluated:Patient Re-evaluated prior to induction Oxygen Delivery Method: Simple face mask Induction Type: IV induction Placement Confirmation: positive ETCO2

## 2022-05-24 NOTE — Discharge Summary (Addendum)
Physician Discharge Summary  Eric Barrett. WER:[id number] DOB: [birth year] DOA: 05/15/2022  PCP: [name], FNP  Admit date: 05/15/2022 Discharge date: 05/24/2022  Admitted From: Home, Disposition:  snf  Recommendations for Outpatient Follow-up:  Please obtain BMP/CBC in one week Please follow up on the following pending results: Final results of polyp biopsy. Last dose of oral ciprofloxacin this evening  patient to be followed for outpatient palliative care at discharge with [facility]   Discharge Condition:Stable CODE STATUS:FULL Diet recommendation: Heart Healthy  Brief/Interim Summary:  81 y.o. male with medical history significant of Alzheimer's dementia, HTN, HLD, chronic HFpEF, remote stroke with residual left-sided weakness, glaucoma, seizure disorder presented with altered mental status and dysuria.  Apparently, patient had been drinking 6 to 8 -16 ounces bottles of water recently.  He saw his PCP 4 days ago, blood work showed sodium of 122, UA showed hematuria and pyuria, p.o. antibiotics was ordered however patient had not started antibiotics yet.  Patient presented to the ED more confused and sodium was 123, UA showing hematuria and pyuria again.  He was started on Rocephin and admitted for further management.   Acute on chronic hyponatremia Likely secondary to poor solute intake Vs SIADH Nephrology consulted, appreciate recs Significantly improved with IV fluids, sodium back to baseline.   Acute metabolic encephalopathy-improving History of Alzheimer's dementia Possibly from UTI and hyponatremia Continue memantine Delirium precaution   MRSE UTI Currently afebrile, with resolving leukocytosis UA positive for UTI UC growing Staph epidermidis >100,000 BC x2 NGTD S/p ceftriaxone, switched to p.o. ciprofloxacin for a total of 7 days, last dose this evening.   Proctitis History of constipation CT abdomen/pelvis showed rectal wall thickening  with perirectal fat stranding suggesting proctitis, moderate amount of retained stool in the colon GI consulted, went for colonoscopy this morning, significant for 6 mm polyp in the sigmoid colon which was removed with a cold snare, resected and retried, as well internal hemorrhoids.  -Recommendation to continue with good bowel regimen. -Resumed on heart healthy diet today     AKI on CKD stage IIIa Improving Creatinine 1.34 on admission, trended up and peaked to 2.4 Possible ATN given frequent hypotensive episodes VS UTI Renal ultrasound was negative for hydronephrosis Seen back to baseline at time of discharge at 1.3 Continue to hold ARB at time of discharge specially blood pressure remains acceptable.    Acute on chronic lower back pain DG lumbar spine showed degenerative changes basically worse at L4-L5 Pain management, PT/OT   Hyperkalemia-resolved Potassium trended up to 6.6, now stable    Hypertension BP now uncontrolled Restart amlodipine, continue to hold losartan   Chronic diastolic CHF Currently compensated Losartan on hold because of AKI   BPH Continue Proscar and Flomax Outpatient follow-up with urology   Seizure disorder Continue Depakote Outpatient follow-up with neurology   Goals of care Palliative care consultation for goals of care discussion Full scope of care for now  Discharge Diagnoses:  Principal Problem:   Hyponatremia Active Problems:   Change in bowel habits   Constipation   Abnormal CT scan, colon   Benign neoplasm of sigmoid colon    Discharge Instructions  Discharge Instructions     Diet - low sodium heart healthy   Complete by: As directed    Discharge instructions   Complete by: As directed    Follow with Primary MD [name], FNP/SNF MD  Get CBC, CMP,  checked  by Primary MD next visit.  Activity: As tolerated with Full fall precautions use walker/cane & assistance as needed   Disposition Home    Diet:  Heart Healthy   On your next visit with your primary care physician please Get Medicines reviewed and adjusted.   Please request your Prim.MD to go over all Hospital Tests and Procedure/Radiological results at the follow up, please get all Hospital records sent to your Prim MD by signing hospital release before you go home.   If you experience worsening of your admission symptoms, develop shortness of breath, life threatening emergency, suicidal or homicidal thoughts you must seek medical attention immediately by calling 911 or calling your MD immediately  if symptoms less severe.  You Must read complete instructions/literature along with all the possible adverse reactions/side effects for all the Medicines you take and that have been prescribed to you. Take any new Medicines after you have completely understood and accpet all the possible adverse reactions/side effects.   Do not drive, operating heavy machinery, perform activities at heights, swimming or participation in water activities or provide baby sitting services if your were admitted for syncope or siezures until you have seen by Primary MD or a Neurologist and advised to do so again.  Do not drive when taking Pain medications.    Do not take more than prescribed Pain, Sleep and Anxiety Medications  Special Instructions: If you have smoked or chewed Tobacco  in the last 2 yrs please stop smoking, stop any regular Alcohol  and or any Recreational drug use.  Wear Seat belts while driving.   Please note  You were cared for by a hospitalist during your hospital stay. If you have any questions about your discharge medications or the care you received while you were in the hospital after you are discharged, you can call the unit and asked to speak with the hospitalist on call if the hospitalist that took care of you is not available. Once you are discharged, your primary care physician will handle any further medical issues. Please note  that NO REFILLS for any discharge medications will be authorized once you are discharged, as it is imperative that you return to your primary care physician (or establish a relationship with a primary care physician if you do not have one) for your aftercare needs so that they can reassess your need for medications and monitor your lab values.   Increase activity slowly   Complete by: As directed       Allergies as of 05/24/2022   No Known Allergies      Medication List     STOP taking these medications    losartan 100 MG tablet Commonly known as: COZAAR       TAKE these medications    acetaminophen 325 MG tablet Commonly known as: TYLENOL Take 2 tablets (650 mg total) by mouth every 6 (six) hours as needed for mild pain, fever or headache.   amLODipine 10 MG tablet Commonly known as: NORVASC Take 10 mg by mouth daily.   ASPIRIN 81 PO Take 81 mg by mouth daily.   ciprofloxacin 500 MG tablet Commonly known as: CIPRO Take 1 tablet (500 mg total) by mouth 2 (two) times daily.   divalproex 500 MG 24 hr tablet Commonly known as: Depakote ER Take 1 tablet (500 mg total) by mouth at bedtime.   docusate sodium 100 MG capsule Commonly known as: COLACE Take 100 mg by mouth daily.   finasteride 5 MG tablet Commonly known as: PROSCAR Take  5 mg by mouth daily.   GaviLAX 17 GM/SCOOP powder Generic drug: polyethylene glycol powder Take 17 g by mouth daily.   memantine 10 MG tablet Commonly known as: NAMENDA Take 10 mg by mouth 2 (two) times daily.   Movantik 25 MG Tabs tablet Generic drug: naloxegol oxalate Take 25 mg by mouth daily.   multivitamin-lutein Caps capsule Take 1 capsule by mouth daily.   Myrbetriq 25 MG Tb24 tablet Generic drug: mirabegron ER Take 25 mg by mouth daily.   rosuvastatin 20 MG tablet Commonly known as: CRESTOR Take 20 mg by mouth daily.   senna 8.6 MG Tabs tablet Commonly known as: SENOKOT Take 1 tablet (8.6 mg total) by mouth  daily.   tamsulosin 0.4 MG Caps capsule Commonly known as: FLOMAX Take 0.4 mg by mouth daily.   Travoprost (BAK Free) 0.004 % Soln ophthalmic solution Commonly known as: TRAVATAN Place 1 drop into both eyes at bedtime.   Vitamin D3 25 MCG (1000 UT) Caps Take 1,000 Units by mouth daily.        Contact information for after-discharge care     Destination     [facility] .   Service: Skilled Nursing Contact information: [place] [phone]                    No Known Allergies  Consultations: Palliative medicine Gastroenterology   Procedures/Studies: DG Lumbar Spine 2-3 Views  Result Date: 05/22/2022 CLINICAL DATA:  Mid sagittal back pain EXAM: LUMBAR SPINE - 2-3 VIEW COMPARISON:  CT 05/21/2022 FINDINGS: Levocurvature. Vertebral body heights are maintained. Moderate disc space narrowing L3-L4 and L5-S1 with advanced degenerative changes at L4-L5. Facet degenerative changes of the lower lumbar spine. IMPRESSION: Degenerative changes, worst at L4-L5 Electronically Signed   By: Donavan Foil M.D.   On: 05/22/2022 18:03   CT ABDOMEN PELVIS W CONTRAST  Result Date: 05/21/2022 CLINICAL DATA:  Bowel obstruction suspected. EXAM: CT ABDOMEN AND PELVIS WITH CONTRAST TECHNIQUE: Multidetector CT imaging of the abdomen and pelvis was performed using the standard protocol following bolus administration of intravenous contrast. RADIATION DOSE REDUCTION: This exam was performed according to the departmental dose-optimization program which includes automated exposure control, adjustment of the mA and/or kV according to patient size and/or use of iterative reconstruction technique. CONTRAST:  2m OMNIPAQUE IOHEXOL 350 MG/ML SOLN COMPARISON:  08/26/2021. FINDINGS: Lower chest: Coronary artery calcifications are noted. Atelectasis is noted at the lung bases bilaterally. There is bronchiectasis in the right lower lobe. There is a  5 mm nodular density in the right middle lobe, axial image 23. Hepatobiliary: No focal liver abnormality is seen. No gallstones, gallbladder wall thickening, or biliary dilatation. Pancreas: Unremarkable. No pancreatic ductal dilatation or surrounding inflammatory changes. Spleen: Normal in size without focal abnormality. Adrenals/Urinary Tract: Thickening of the bilateral adrenal glands with no evidence of discrete nodule. The kidneys enhance symmetrically. Cysts are present in the kidneys bilaterally. Subcentimeter hypodensities are noted bilaterally which are too small to further characterize. No renal calculus or hydronephrosis. The bladder is unremarkable. Stomach/Bowel: Stomach is within normal limits. Appendix is not well seen. No bowel obstruction, free air, or pneumatosis. Air-fluid levels are noted in the rectum with rectal wall thickening and perirectal fat stranding and edema. A moderate amount of retained stool is present in the colon. Vascular/Lymphatic: Aortic atherosclerosis. No enlarged abdominal or pelvic lymph nodes. Reproductive: Prostate is unremarkable. Other: No abdominopelvic ascites. Musculoskeletal: Degenerative changes are present  in the thoracolumbar spine. No acute or suspicious osseous abnormality. IMPRESSION: 1. No bowel obstruction or free air. 2. Rectal wall thickening with perirectal fat stranding and edema suggesting proctitis. 3. Moderate amount of retained stool in the colon, possible constipation. 4. Bilateral renal cysts. 5. Aortic atherosclerosis. Electronically Signed   By: Brett Fairy M.D.   On: 05/21/2022 23:29   DG Abd Portable 1V  Result Date: 05/21/2022 CLINICAL DATA:  Constipation EXAM: PORTABLE ABDOMEN - 1 VIEW COMPARISON:  Abdominal x-ray 04/26/2022 FINDINGS: Air seen throughout large and small bowel to the level the rectum. Stool burden is moderate. There is some dilated colonic loops in the right abdomen measuring up to 8 cm. No suspicious calcifications.  Lung bases are clear. No acute fractures. IMPRESSION: 1. Dilated colonic loops in the right abdomen measuring up to 8 cm. Findings are indeterminate and can be related to bowel obstruction or ileus. Recommend further evaluation with CT. Electronically Signed   By: Ronney Asters M.D.   On: 05/21/2022 18:33   US RENAL  Result Date: 05/15/2022 CLINICAL DATA:  Acute kidney injury. EXAM: RENAL / URINARY TRACT ULTRASOUND COMPLETE COMPARISON:  CT abdomen dated 08/26/2021. FINDINGS: Right Kidney: Renal measurements: 9.9 x 5 x 5.1 cm = volume: 131 mL. Cortical thickness and echogenicity is within normal limits. 2 renal cysts are identified, measuring 1.6 cm and 1.1 cm respectively. No follow-up imaging is recommended for these benign findings. No suspicious mass or hydronephrosis. Left Kidney: Renal measurements: 9.2 x 4.7 x 4.2 cm = volume: 96 mL. Cortical thickness and echogenicity within normal limits. No mass or hydronephrosis. Bladder: Appears normal for degree of bladder distention. Patient voided just prior to exam. Postvoid volume measured at 342 cc. Other: None. IMPRESSION: 1. Kidneys are normal in size and appearance. No hydronephrosis. 2. Bladder is unremarkable. Patient voided just prior to exam. Postvoid volume measured at 342 cc. Electronically Signed   By: Franki Cabot M.D.   On: 05/15/2022 14:51   DG Abd 1 View  Result Date: 04/27/2022 CLINICAL DATA:  Constipation.  No bowel movement for 4 days. EXAM: ABDOMEN - 1 VIEW COMPARISON:  None Available. FINDINGS: Mild fecal loading in the colon, particularly the ascending colon and descending colon. Air-filled loops of colon are identified with no evidence of obstruction. No small bowel dilatation noted. Degenerative changes are identified in the lower lumbar spine and the hips. No other bony or soft tissue abnormalities are noted. IMPRESSION: Mild fecal loading in the ascending and descending colon. No other significant abnormalities. Electronically Signed    By: Dorise Bullion III M.D.   On: 04/27/2022 18:45      Subjective: Denies any complaints, tolerating his lunch after colonoscopy this morning, he had loose bowel movements due to bowel prep overnight.  Discharge Exam: Vitals:   05/24/22 1000 05/24/22 1015  BP: 127/76 139/83  Pulse: 82 79  Resp: (!) 21 19  Temp: 98.2 F (36.8 C) 98.2 F (36.8 C)  SpO2: 99% 96%   Vitals:   05/24/22 0737 05/24/22 0903 05/24/22 1000 05/24/22 1015  BP: 134/83 (!) 149/88 127/76 139/83  Pulse: 87 94 82 79  Resp: 15 20 (!) 21 19  Temp: 98.5 F (36.9 C) 98.1 F (36.7 C) 98.2 F (36.8 C) 98.2 F (36.8 C)  TempSrc: Oral Temporal    SpO2: 97% 95% 99% 96%  Weight:      Height:        General: Pt is alert, awake, not in acute distress  Cardiovascular: RRR, S1/S2 +, no rubs, no gallops Respiratory: CTA bilaterally, no wheezing, no rhonchi Abdominal: Soft, NT, ND, bowel sounds + Extremities: no edema, no cyanosis    The results of significant diagnostics from this hospitalization (including imaging, microbiology, ancillary and laboratory) are listed below for reference.     Microbiology: Recent Results (from the past 240 hour(s))  Urine Culture     Status: Abnormal   Collection Time: 05/16/22  7:22 AM   Specimen: Urine, Clean Catch  Result Value Ref Range Status   Specimen Description URINE, CLEAN CATCH  Final   Special Requests   Final    NONE Performed at [facility], [address], [place], [place] [address]    Culture >=100,000 COLONIES/mL STAPHYLOCOCCUS EPIDERMIDIS (A)  Final   Report Status 05/18/2022 FINAL  Final   Organism ID, Bacteria STAPHYLOCOCCUS EPIDERMIDIS (A)  Final      Susceptibility   Staphylococcus epidermidis - MIC*    CIPROFLOXACIN <=0.5 SENSITIVE Sensitive     GENTAMICIN <=0.5 SENSITIVE Sensitive     NITROFURANTOIN <=16 SENSITIVE Sensitive     OXACILLIN >=4 RESISTANT Resistant     TETRACYCLINE 2 SENSITIVE Sensitive     VANCOMYCIN 2 SENSITIVE Sensitive      TRIMETH/SULFA 20 SENSITIVE Sensitive     CLINDAMYCIN <=0.25 SENSITIVE Sensitive     RIFAMPIN <=0.5 SENSITIVE Sensitive     Inducible Clindamycin NEGATIVE Sensitive     * >=100,000 COLONIES/mL STAPHYLOCOCCUS EPIDERMIDIS  Culture, blood (Routine X 2) w Reflex to ID Panel     Status: None   Collection Time: 05/16/22  7:38 AM   Specimen: BLOOD  Result Value Ref Range Status   Specimen Description BLOOD RIGHT ANTECUBITAL  Final   Special Requests   Final    BOTTLES DRAWN AEROBIC AND ANAEROBIC Blood Culture adequate volume   Culture   Final    NO GROWTH 5 DAYS Performed at [facility], [address], [place], [place] [address]    Report Status 05/21/2022 FINAL  Final  Culture, blood (Routine X 2) w Reflex to ID Panel     Status: None   Collection Time: 05/16/22  7:41 AM   Specimen: BLOOD LEFT ARM  Result Value Ref Range Status   Specimen Description BLOOD LEFT ARM  Final   Special Requests   Final    BOTTLES DRAWN AEROBIC AND ANAEROBIC Blood Culture adequate volume   Culture   Final    NO GROWTH 5 DAYS Performed at [facility], [place] [address], [place], [place] [address]    Report Status 05/21/2022 FINAL  Final     Labs: BNP (last 3 results) No results for input(s): "BNP" in the last 8760 hours. Basic Metabolic Panel: Recent Labs  Lab 05/20/22 0647 05/21/22 0736 05/22/22 0509 05/23/22 0344 05/24/22 0425  NA 134* 137 137 141 142  K 3.6 3.7 4.1 4.0 3.5  CL 103 102 99 102 108  CO2 '27 27 26 30 28  '$ GLUCOSE 102* 101* 112* 124* 124*  BUN '12 15 18 17 9  '$ CREATININE 1.27* 1.33* 1.40* 1.49* 1.31*  CALCIUM 8.8* 8.9 9.0 9.0 8.8*  PHOS 3.5 4.1 3.8 4.0 3.2   Liver Function Tests: Recent Labs  Lab 05/20/22 0647 05/21/22 0736 05/22/22 0509 05/23/22 0344 05/24/22 0425  ALBUMIN 2.7* 2.6* 2.6* 2.7* 2.4*   No results for input(s): "LIPASE", "AMYLASE" in the last 168 hours. No results for input(s): "AMMONIA" in the last 168 hours. CBC:  Recent Labs  Lab  05/20/22 0647 05/21/22 0736 05/22/22 0509 05/23/22 0344 05/24/22 0425  WBC 13.1* 12.2* 10.6* 10.1 8.9  NEUTROABS 9.2* 7.8* 6.8 6.4 6.1  HGB 12.7* 12.4* 12.3* 12.7* 11.8*  HCT 35.9* 35.8* 35.5* 37.6* 34.7*  MCV 80.3 81.4 80.9 82.3 82.2  PLT 194 206 159 259 250   Cardiac Enzymes: No results for input(s): "CKTOTAL", "CKMB", "CKMBINDEX", "TROPONINI" in the last 168 hours. BNP: Invalid input(s): "POCBNP" CBG: No results for input(s): "GLUCAP" in the last 168 hours. D-Dimer No results for input(s): "DDIMER" in the last 72 hours. Hgb A1c No results for input(s): "HGBA1C" in the last 72 hours. Lipid Profile No results for input(s): "CHOL", "HDL", "LDLCALC", "TRIG", "CHOLHDL", "LDLDIRECT" in the last 72 hours. Thyroid function studies No results for input(s): "TSH", "T4TOTAL", "T3FREE", "THYROIDAB" in the last 72 hours.  Invalid input(s): "FREET3" Anemia work up No results for input(s): "VITAMINB12", "FOLATE", "FERRITIN", "TIBC", "IRON", "RETICCTPCT" in the last 72 hours. Urinalysis    Component Value Date/Time   COLORURINE PINK (A) 05/15/2022 1109   APPEARANCEUR HAZY (A) 05/15/2022 1109   LABSPEC 1.005 05/15/2022 1109   PHURINE 7.0 05/15/2022 1109   GLUCOSEU 50 (A) 05/15/2022 1109   HGBUR MODERATE (A) 05/15/2022 1109   BILIRUBINUR NEGATIVE 05/15/2022 1109   KETONESUR NEGATIVE 05/15/2022 1109   PROTEINUR 30 (A) 05/15/2022 1109   NITRITE POSITIVE (A) 05/15/2022 1109   LEUKOCYTESUR LARGE (A) 05/15/2022 1109   Sepsis Labs Recent Labs  Lab 05/21/22 0736 05/22/22 0509 05/23/22 0344 05/24/22 0425  WBC 12.2* 10.6* 10.1 8.9   Microbiology Recent Results (from the past 240 hour(s))  Urine Culture     Status: Abnormal   Collection Time: 05/16/22  7:22 AM   Specimen: Urine, Clean Catch  Result Value Ref Range Status   Specimen Description URINE, CLEAN CATCH  Final   Special Requests   Final    NONE Performed at [facility], [place] [address], [place], [place]  [address]    Culture >=100,000 COLONIES/mL STAPHYLOCOCCUS EPIDERMIDIS (A)  Final   Report Status 05/18/2022 FINAL  Final   Organism ID, Bacteria STAPHYLOCOCCUS EPIDERMIDIS (A)  Final      Susceptibility   Staphylococcus epidermidis - MIC*    CIPROFLOXACIN <=0.5 SENSITIVE Sensitive     GENTAMICIN <=0.5 SENSITIVE Sensitive     NITROFURANTOIN <=16 SENSITIVE Sensitive     OXACILLIN >=4 RESISTANT Resistant     TETRACYCLINE 2 SENSITIVE Sensitive     VANCOMYCIN 2 SENSITIVE Sensitive     TRIMETH/SULFA 20 SENSITIVE Sensitive     CLINDAMYCIN <=0.25 SENSITIVE Sensitive     RIFAMPIN <=0.5 SENSITIVE Sensitive     Inducible Clindamycin NEGATIVE Sensitive     * >=100,000 COLONIES/mL STAPHYLOCOCCUS EPIDERMIDIS  Culture, blood (Routine X 2) w Reflex to ID Panel     Status: None   Collection Time: 05/16/22  7:38 AM   Specimen: BLOOD  Result Value Ref Range Status   Specimen Description BLOOD RIGHT ANTECUBITAL  Final   Special Requests   Final    BOTTLES DRAWN AEROBIC AND ANAEROBIC Blood Culture adequate volume   Culture   Final    NO GROWTH 5 DAYS Performed at [facility], [address], [place], [place] [address]    Report Status 05/21/2022 FINAL  Final  Culture, blood (Routine X 2) w Reflex to ID Panel     Status: None   Collection Time: 05/16/22  7:41 AM   Specimen: BLOOD LEFT ARM  Result Value Ref Range Status   Specimen Description BLOOD LEFT ARM  Final   Special Requests   Final    BOTTLES DRAWN AEROBIC AND ANAEROBIC Blood Culture adequate volume   Culture   Final    NO GROWTH 5 DAYS Performed at [facility], [address], [place], [place] [address]    Report Status 05/21/2022 FINAL  Final     Time coordinating discharge: Over 30 minutes  SIGNED:   Phillips Climes, MD  [facility] 05/24/2022, 2:42 PM Pager   If 7PM-7AM, please contact night-coverage [email or web address]

## 2022-05-24 NOTE — Op Note (Addendum)
[facility] Patient Name: Eric Barrett Procedure Date : 05/24/2022 MRN: [id number] Attending MD: Ladene Artist , MD Date of Birth: 29-Dec-1940 CSN: [id number] Age: 81 Admit Type: Outpatient Procedure:                Colonoscopy Indications:              Abnormal CT of the GI tract (rectum), Change in                            bowel habits, Worsening constipation Providers:                Pricilla Riffle. Fuller Plan, MD, Jobe Igo, RN, Gloris Ham, Technician Referring MD:             [facility] Medicines:                Monitored Anesthesia Care Complications:            No immediate complications. Estimated blood loss:                            None. Estimated Blood Loss:     Estimated blood loss: none. Procedure:                Pre-Anesthesia Assessment:                           - Prior to the procedure, a History and Physical                            was performed, and patient medications and                            allergies were reviewed. [name] tolerance of                            previous anesthesia was also reviewed. The risks                            and benefits of the procedure and the sedation                            options and risks were discussed with the patient.                            All questions were answered, and informed consent                            was obtained. Prior Anticoagulants: The patient has                            taken no previous anticoagulant or antiplatelet                            agents.  ASA Grade Assessment: III - A patient with                            severe systemic disease. After reviewing the risks                            and benefits, the patient was deemed in                            satisfactory condition to undergo the procedure.                           After obtaining informed consent, the colonoscope                            was passed under direct  vision. Throughout the                            procedure, the patient's blood pressure, pulse, and                            oxygen saturations were monitored continuously. The                            [id number]-HQ190L ([id number]) Olympus coloscope was                            introduced through the anus and advanced to the the                            cecum, identified by appendiceal orifice and                            ileocecal valve. The ileocecal valve, appendiceal                            orifice, and rectum were photographed. The quality                            of the bowel preparation was adequate after                            extensive lavage, suction. The colonoscopy was                            somewhat difficult due to a tortuous colon and                            significant right colon spasm. The patient                            tolerated the procedure well. Scope In: 9:33:56 AM Scope Out: 9:53:53 AM Scope Withdrawal Time: 0 hours 14 minutes 35 seconds  Total Procedure Duration: 0 hours  19 minutes 57 seconds  Findings:      The perianal and digital rectal examinations were normal.      A 6 mm polyp was found in the sigmoid colon. The polyp was sessile. The       polyp was removed with a cold snare. Resection and retrieval were       complete.      Internal hemorrhoids were found during retroflexion. The hemorrhoids       were small and Grade I (internal hemorrhoids that do not prolapse).      The exam was otherwise without abnormality on direct and retroflexion       views. Impression:               - One 6 mm polyp in the sigmoid colon, removed with                            a cold snare. Resected and retrieved.                           - Internal hemorrhoids.                           - The examination was otherwise normal on direct                            and retroflexion views. Recommendation:           - Patient has a contact number available  for                            emergencies. The signs and symptoms of potential                            delayed complications were discussed with the                            patient. Return to normal activities tomorrow.                            Written discharge instructions were provided to the                            patient.                           - Resume previous diet.                           - Continue present medications.                           - Await pathology results.                           - No repeat colonoscopy due to age.                           - Senakot  qd (not prn) and Miralax qd or MOM qd                            (not prn) for long term mgmt of constipation.                           - Post hospital follow up with PCP.                           - Outpatient GI follow up is not needed at this                            time.                           - GI signing off. Procedure Code(s):        --- Professional ---                           [phone], Colonoscopy, flexible; with removal of                            tumor(s), polyp(s), or other lesion(s) by snare                            technique Diagnosis Code(s):        --- Professional ---                           K63.5, Polyp of colon                           K64.0, First degree hemorrhoids                           R19.4, Change in bowel habit                           K59.00, Constipation, unspecified                           R93.3, Abnormal findings on diagnostic imaging of                            other parts of digestive tract CPT copyright 2019 American Medical Association. All rights reserved. The codes documented in this report are preliminary and upon coder review may  be revised to meet current compliance requirements. Ladene Artist, MD 05/24/2022 10:07:21 AM This report has been signed electronically. Number of Addenda: 0

## 2022-05-24 NOTE — Progress Notes (Signed)
Patient has been discharged.

## 2022-05-24 NOTE — Progress Notes (Signed)
Pt has been having Bms all shift and is mostly clear. Has been NPO since midnight

## 2022-05-24 NOTE — Transfer of Care (Signed)
Immediate Anesthesia Transfer of Care Note  Patient: Eric Barrett.  Procedure(s) Performed: COLONOSCOPY WITH PROPOFOL POLYPECTOMY  Patient Location: PACU  Anesthesia Type:MAC  Level of Consciousness: awake, alert  and oriented  Airway & Oxygen Therapy: Patient Spontanous Breathing  Post-op Assessment: Report given to RN, Post -op Vital signs reviewed and stable and Patient moving all extremities X 4  Post vital signs: Reviewed and stable  Last Vitals:  Vitals Value Taken Time  BP 127/76 05/24/22 1001  Temp 36.8 C 05/24/22 1000  Pulse 78 05/24/22 1003  Resp 18 05/24/22 1003  SpO2 98 % 05/24/22 1003  Vitals shown include unvalidated device data.  Last Pain:  Vitals:   05/24/22 1000  TempSrc:   PainSc: Asleep         Complications: No notable events documented.

## 2022-05-25 LAB — SURGICAL PATHOLOGY

## 2022-05-28 ENCOUNTER — Encounter (HOSPITAL_COMMUNITY): Payer: Self-pay | Admitting: Gastroenterology

## 2022-05-30 ENCOUNTER — Encounter: Payer: Self-pay | Admitting: Gastroenterology

## 2022-05-31 ENCOUNTER — Encounter: Payer: Medicare HMO | Admitting: Internal Medicine

## 2022-05-31 NOTE — Progress Notes (Deleted)
[facility] Consult Note Telephone: [phone]  Fax: [phone]   Date of encounter: 05/31/22 9:04 AM PATIENT NAME: Rider Ermis [address] [place] [address]   [phone] (home)  DOB: 25-Jan-1941 MRN: [id number] PRIMARY CARE PROVIDER:    [name], FNP,  [place] [address] [phone]  REFERRING PROVIDER:   [name], [place],  [place] [address] [phone]  RESPONSIBLE PARTY:    Contact Information     Name Relation Home Work [place], [place] Spouse   [phone]        I met face to face with patient and family in [place] facility in [place]. Palliative Care was asked to follow this patient by consultation request of  [name], FNP to address advance care planning and complex medical decision making. This is the initial visit.                                     ASSESSMENT AND PLAN / RECOMMENDATIONS:   Advance Care Planning/Goals of Care: Goals include to maximize quality of life and symptom management. Patient/health care surrogate gave his/her permission to discuss.Our advance care planning conversation included a discussion about:    The value and importance of advance care planning  Experiences with loved ones who have been seriously ill or have died  Exploration of personal, cultural or spiritual beliefs that might influence medical decisions  Exploration of goals of care in the event of a sudden injury or illness  Identification  of a healthcare agent  Review and updating or creation of an  advance directive document . Decision not to resuscitate or to de-escalate disease focused treatments due to poor prognosis. CODE STATUS:  Symptom Management/Plan:    Follow up Palliative Care Visit: Palliative care will continue to follow for complex medical decision making, advance care planning, and clarification of  goals. Return ***  I spent *** minutes providing this consultation. More than 50% of the time in this consultation was spent in counseling and care coordination.  This visit was coded based on medical decision making (MDM).***  PPS: ***0%  HOSPICE ELIGIBILITY/DIAGNOSIS: ***/***  Chief Complaint: ***  HISTORY OF PRESENT ILLNESS:  Ralston Venus. is a 81 y.o. year old male  with Alzheimer's dementia, HTN, HLD, chronic HFpEF, remote stroke with residual left-sided weakness, glaucoma, seizure disorder , BPH who was recently hospitalized early this month with delirium and dysuria.  He was found to have considerable hyponatremia to 122, hematuria pyuria and treated for UTI with rocephin.  He improved with IVFs and ucx grew MRSA so abx were switched over to po cipro for 7 days.  He also had some proctitis and constipation on CT abd/pelvis and colonoscopy showed a 6 mm polyp in the sigmoid colon that was removed.  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  He initially had hypotension and bp meds held but later resumed.  He has chronic HFpEF.  He was seen by inpatient palliative care on 05/20/22 and community palliative was introduced--referral to [place] entered.  Currently he remains full code with full scope and  is here for rehab.  PPS was listed as 40% at the time of the consult.    History obtained from review of EMR, discussion with primary team, and interview with family, facility staff/caregiver and/or Mr. Bagheri.   I reviewed available labs, medications, imaging, studies and related documents from the EMR.  Records reviewed and summarized above.   ROS  Review of Systems  Physical Exam: There were no vitals filed for this visit. There is no height or weight on file to calculate BMI. Wt Readings from Last 500 Encounters:  05/15/22 181 lb 3.5  oz (82.2 kg)  02/27/22 184 lb 9.6 oz (83.7 kg)  01/19/22 168 lb (76.2 kg)  08/25/21 168 lb (76.2 kg)  08/02/21 168 lb (76.2 kg)  01/27/21 184 lb 3.2 oz (83.6 kg)  01/18/21 160 lb (72.6 kg)  04/09/20 183 lb 8 oz (83.2 kg)  02/26/20 186 lb 3.2 oz (84.5 kg)  11/27/19 187 lb 8 oz (85 kg)  11/18/19 188 lb (85.3 kg)   Physical Exam  CURRENT PROBLEM LIST:  Patient Active Problem List   Diagnosis Date Noted   Change in bowel habits    Constipation    Abnormal CT scan, colon    Benign neoplasm of sigmoid colon    Hyponatremia 05/15/2022   Alteration consciousness 01/19/2022   Dementia ([place]) 01/19/2022   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[place]) 08/02/2021   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 ([place]) 08/02/2021   Memory disturbance 01/27/2021   History of stroke 11/27/2019   Gait abnormality 11/27/2019   PAST MEDICAL HISTORY:  Active Ambulatory Problems    Diagnosis Date Noted   History of stroke 11/27/2019   Gait abnormality 11/27/2019   Memory disturbance 01/27/2021   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[place]) 08/02/2021   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 ([place]) 08/02/2021   Alteration consciousness 01/19/2022   Dementia ([place]) 01/19/2022   Hyponatremia 05/15/2022   Change in bowel habits    Constipation    Abnormal CT scan, colon    Benign neoplasm of sigmoid colon    Resolved Ambulatory Problems    Diagnosis Date Noted   No Resolved Ambulatory Problems   Past Medical History:  Diagnosis Date   CHF (congestive heart failure) (HCC)    Glaucoma    Hemiparesis of left nondominant side (HCC)    History of shingles    HLD (hyperlipidemia)    Hypertension    Stroke ([place])    Tremor    SOCIAL HX:  Social History   Tobacco Use   Smoking status: Former   Smokeless tobacco: Never  Substance Use Topics   Alcohol use: Not Currently   FAMILY HX:  Family History  Problem Relation Age of Onset   Heart attack Mother    Heart attack Father        ALLERGIES: No Known Allergies    PERTINENT MEDICATIONS:  Outpatient Encounter Medications as of 05/31/2022  Medication Sig   acetaminophen (TYLENOL) 325 MG tablet Take 2 tablets (650 mg total) by mouth every 6 (six) hours as needed for mild pain, fever or headache.   amLODipine (NORVASC) 10 MG tablet Take 10 mg by mouth daily.   ASPIRIN 81 PO Take 81 mg by mouth daily.   Cholecalciferol (VITAMIN D3) 25 MCG (1000 UT) CAPS Take 1,000 Units by mouth daily.   ciprofloxacin (CIPRO) 500 MG tablet Take 1 tablet (500 mg total) by mouth 2 (two) times daily.   divalproex (DEPAKOTE ER) 500 MG  24 hr tablet Take 1 tablet (500 mg total) by mouth at bedtime.   docusate sodium (COLACE) 100 MG capsule Take 100 mg by mouth daily.   finasteride (PROSCAR) 5 MG tablet Take 5 mg by mouth daily.   GAVILAX 17 GM/SCOOP powder Take 17 g by mouth daily.   memantine (NAMENDA) 10 MG tablet Take 10 mg by mouth 2 (two) times daily.   MOVANTIK 25 MG TABS tablet Take 25 mg by mouth daily.   multivitamin-lutein (OCUVITE-LUTEIN) CAPS capsule Take 1 capsule by mouth daily.   MYRBETRIQ 25 MG TB24 tablet Take 25 mg by mouth daily.   rosuvastatin (CRESTOR) 20 MG tablet Take 20 mg by mouth daily.   senna (SENOKOT) 8.6 MG TABS tablet Take 1 tablet (8.6 mg total) by mouth daily.   tamsulosin (FLOMAX) 0.4 MG CAPS capsule Take 0.4 mg by mouth daily.   Travoprost, BAK Free, (TRAVATAN) 0.004 % SOLN ophthalmic solution Place 1 drop into both eyes at bedtime.   No facility-administered encounter medications on file as of 05/31/2022.    Thank you for the opportunity to participate in the care of Mr. Delucchi.  The palliative care team will continue to follow. Please call our office at [phone] if we can be of additional assistance.   Hollace Kinnier, DO  COVID-19 PATIENT SCREENING TOOL Asked and negative response unless otherwise noted:  Have you had symptoms of covid, tested positive or been in contact with someone with  symptoms/positive test in the past 5-10 days?  NO

## 2022-06-04 NOTE — Progress Notes (Deleted)
Cardiology Office Note:    Date:  06/04/2022   ID:  Eric Barrett., DOB [birth year], MRN [id number]  PCP:  Eric Barrett., FNP  Cardiologist:  Eric Heinz, Eric Barrett  Electrophysiologist:  None   Referring Eric Barrett: Eric Barrett., FNP   No chief complaint on file.   History of Present Illness:    Eric Barrett. is a 81 y.o. male with a hx of Alzheimer's disease, chronic diastolic heart failure hypertension, hyperlipidemia, tobacco use, CVA who presents for follow-up.  He was referred by Eric Folks, FNP for evaluation of hypertension and heart failure, initially seen 11/18/2019.  Moved to [facility] in September 2020.  Denies any chest pain, dyspnea, LE edema, lightheadedness, or syncope.  Reports occasional palpitations which he describes as feeling like heart is racing.  Occurs rarely.  States he has had a BP monitor at home but does not check his pressure.  Does not exercise regularly.  TTE on 03/06/2018 showed EF 60%, mild LVH, mild AI, mild MR, mild TR.  Echocardiogram 12/03/2019 showed normal biventricular function, grade 1 diastolic dysfunction, mild aortic regurgitation, mild dilatation of ascending aorta measuring 38 mm.  30-day monitor 12/2019 showed no significant arrhythmias.  Admission 05/2022 with altered mental status, found to have hyponatremia with sodium 123.  Nephrology was consulted and thought to be poor solute intake versus SIADH, improved with IV fluids.  Since last clinic visit,  Past Medical History:  Diagnosis Date   CHF (congestive heart failure) ([place])    Glaucoma    Hemiparesis of left nondominant Barrett ([place])    History of shingles    HLD (hyperlipidemia)    Hypertension    Stroke [facility])    Tremor       Current Medications: No outpatient medications have been marked as taking for the 06/05/22 encounter (Appointment) with Eric Heinz, Eric Barrett.     Allergies:   Patient has no known allergies.   Social History   Socioeconomic History    Marital status: Married    Spouse name: Eric Barrett   Number of children: 0   Years of education: 12   Highest education level: High school graduate  Occupational History   Occupation: Retired  Tobacco Use   Smoking status: Former   Smokeless tobacco: Never  Substance and Sexual Activity   Alcohol use: Not Currently   Drug use: Never   Sexual activity: Not on file  Other Topics Concern   Not on file  Social History Narrative   Lives at home with his wife.   Right-handed.   One cup coffee per day.   Social Determinants of Health   Financial Resource Strain: Not on file  Food Insecurity: No Food Insecurity (05/15/2022)   Hunger Vital Sign    Worried About Running Out of Food in the Last Year: Never true    Ran Out of Food in the Last Year: Never true  Transportation Needs: No Transportation Needs (05/15/2022)   PRAPARE - Hydrologist (Medical): No    Lack of Transportation (Non-Medical): No  Physical Activity: Not on file  Stress: Not on file  Social Connections: Not on file     Family History: [name] family history includes Heart attack in his father and mother.  ROS:   Please see the history of present illness.     All other systems reviewed and are negative.  EKGs/Labs/Other Studies Reviewed:    The following studies were  reviewed today:   EKG:  EKG is ordered today.  The ekg ordered today demonstrates normal sinus rhythm, rate 74, nonspecific T wave flattening  Recent Labs: 05/17/2022: ALT 18; TSH 0.797 05/24/2022: BUN 9; Creatinine, Ser 1.31; Hemoglobin 11.8; Platelets 250; Potassium 3.5; Sodium 142  Recent Lipid Panel    Component Value Date/Time   CHOL 118 11/18/2019 1446   TRIG 49 11/18/2019 1446   HDL 52 11/18/2019 1446   CHOLHDL 2.3 11/18/2019 1446   LDLCALC 54 11/18/2019 1446    Physical Exam:    VS:  There were no vitals taken for this visit.    Wt Readings from Last 3 Encounters:  05/15/22 181 lb 3.5 oz (82.2 kg)   02/27/22 184 lb 9.6 oz (83.7 kg)  01/19/22 168 lb (76.2 kg)     GEN:in no acute distress HEENT: Normal NECK: No JVD LYMPHATICS: No lymphadenopathy CARDIAC: RRR, no murmurs, rubs, gallops RESPIRATORY:  Clear to auscultation without rales, wheezing or rhonchi  ABDOMEN: Soft, non-tender, non-distended MUSCULOSKELETAL: Trace edema SKIN: Warm and dry NEUROLOGIC:  Alert and oriented x 3 PSYCHIATRIC:  Normal affect   ASSESSMENT:    No diagnosis found.  PLAN:    Chronic diastolic heart failure: Echocardiogram 12/03/2019 showed normal biventricular function, grade 1 diastolic dysfunction, mild aortic regurgitation, mild dilatation of ascending aorta measuring 38 mm  Palpitations: 30-day monitor 12/2019 showed no significant arrhythmias.  Hypertension: On amlodipine 10 mg daily  CVA: On aspirin 81 mg daily and rosuvastatin 20 mg daily  Hyperlipidemia: On rosuvastatin 20 mg daily.  LDL 54 on 11/18/2019  RTC in***   Medication Adjustments/Labs and Tests Ordered: Current medicines are reviewed at length with the patient today.  Concerns regarding medicines are outlined above.  No orders of the defined types were placed in this encounter.  No orders of the defined types were placed in this encounter.   There are no Patient Instructions on file for this visit.   Signed, Eric Heinz, Eric Barrett  06/04/2022 5:56 PM    [place]

## 2022-06-05 ENCOUNTER — Ambulatory Visit: Payer: MEDICAID | Attending: Cardiology | Admitting: Cardiology

## 2022-06-07 NOTE — Progress Notes (Signed)
This encounter was created in error - please disregard.

## 2022-06-28 ENCOUNTER — Other Ambulatory Visit: Payer: Self-pay | Admitting: Family

## 2022-06-28 DIAGNOSIS — M545 Low back pain, unspecified: Secondary | ICD-10-CM

## 2022-07-06 ENCOUNTER — Emergency Department (HOSPITAL_BASED_OUTPATIENT_CLINIC_OR_DEPARTMENT_OTHER): Payer: Medicare HMO

## 2022-07-06 ENCOUNTER — Encounter (HOSPITAL_COMMUNITY): Payer: Self-pay

## 2022-07-06 ENCOUNTER — Encounter (HOSPITAL_BASED_OUTPATIENT_CLINIC_OR_DEPARTMENT_OTHER): Payer: Self-pay

## 2022-07-06 ENCOUNTER — Other Ambulatory Visit: Payer: Self-pay

## 2022-07-06 ENCOUNTER — Inpatient Hospital Stay (HOSPITAL_BASED_OUTPATIENT_CLINIC_OR_DEPARTMENT_OTHER)
Admission: EM | Admit: 2022-07-06 | Discharge: 2022-07-26 | DRG: 478 | Disposition: A | Discharge status: Skilled Nursing Facility | Payer: Medicare HMO | Attending: Family Medicine | Admitting: Family Medicine

## 2022-07-06 DIAGNOSIS — M4647 Discitis, unspecified, lumbosacral region: Secondary | ICD-10-CM | POA: Diagnosis present

## 2022-07-06 DIAGNOSIS — M4627 Osteomyelitis of vertebra, lumbosacral region: Secondary | ICD-10-CM | POA: Diagnosis present

## 2022-07-06 DIAGNOSIS — N400 Enlarged prostate without lower urinary tract symptoms: Secondary | ICD-10-CM | POA: Diagnosis present

## 2022-07-06 DIAGNOSIS — G40909 Epilepsy, unspecified, not intractable, without status epilepticus: Secondary | ICD-10-CM | POA: Diagnosis present

## 2022-07-06 DIAGNOSIS — M4807 Spinal stenosis, lumbosacral region: Secondary | ICD-10-CM | POA: Diagnosis present

## 2022-07-06 DIAGNOSIS — Z7982 Long term (current) use of aspirin: Secondary | ICD-10-CM

## 2022-07-06 DIAGNOSIS — K5900 Constipation, unspecified: Secondary | ICD-10-CM | POA: Diagnosis present

## 2022-07-06 DIAGNOSIS — G309 Alzheimer's disease, unspecified: Secondary | ICD-10-CM | POA: Diagnosis present

## 2022-07-06 DIAGNOSIS — F03B0 Unspecified dementia, moderate, without behavioral disturbance, psychotic disturbance, mood disturbance, and anxiety: Secondary | ICD-10-CM | POA: Diagnosis present

## 2022-07-06 DIAGNOSIS — I69354 Hemiplegia and hemiparesis following cerebral infarction affecting left non-dominant side: Secondary | ICD-10-CM

## 2022-07-06 DIAGNOSIS — M545 Low back pain, unspecified: Secondary | ICD-10-CM | POA: Diagnosis not present

## 2022-07-06 DIAGNOSIS — R262 Difficulty in walking, not elsewhere classified: Secondary | ICD-10-CM | POA: Diagnosis present

## 2022-07-06 DIAGNOSIS — R251 Tremor, unspecified: Secondary | ICD-10-CM | POA: Diagnosis present

## 2022-07-06 DIAGNOSIS — E875 Hyperkalemia: Secondary | ICD-10-CM | POA: Diagnosis present

## 2022-07-06 DIAGNOSIS — I10 Essential (primary) hypertension: Secondary | ICD-10-CM | POA: Diagnosis present

## 2022-07-06 DIAGNOSIS — E222 Syndrome of inappropriate secretion of antidiuretic hormone: Secondary | ICD-10-CM | POA: Diagnosis present

## 2022-07-06 DIAGNOSIS — Z87440 Personal history of urinary (tract) infections: Secondary | ICD-10-CM

## 2022-07-06 DIAGNOSIS — E872 Acidosis, unspecified: Secondary | ICD-10-CM

## 2022-07-06 DIAGNOSIS — G8929 Other chronic pain: Secondary | ICD-10-CM | POA: Diagnosis present

## 2022-07-06 DIAGNOSIS — Z79899 Other long term (current) drug therapy: Secondary | ICD-10-CM

## 2022-07-06 DIAGNOSIS — M4626 Osteomyelitis of vertebra, lumbar region: Secondary | ICD-10-CM | POA: Diagnosis not present

## 2022-07-06 DIAGNOSIS — H409 Unspecified glaucoma: Secondary | ICD-10-CM | POA: Diagnosis present

## 2022-07-06 DIAGNOSIS — Z23 Encounter for immunization: Secondary | ICD-10-CM

## 2022-07-06 DIAGNOSIS — I130 Hypertensive heart and chronic kidney disease with heart failure and stage 1 through stage 4 chronic kidney disease, or unspecified chronic kidney disease: Secondary | ICD-10-CM | POA: Diagnosis present

## 2022-07-06 DIAGNOSIS — N1831 Chronic kidney disease, stage 3a: Secondary | ICD-10-CM | POA: Diagnosis present

## 2022-07-06 DIAGNOSIS — Z87891 Personal history of nicotine dependence: Secondary | ICD-10-CM

## 2022-07-06 DIAGNOSIS — Z86010 Personal history of colonic polyps: Secondary | ICD-10-CM

## 2022-07-06 DIAGNOSIS — Z8619 Personal history of other infectious and parasitic diseases: Secondary | ICD-10-CM

## 2022-07-06 DIAGNOSIS — F02B0 Dementia in other diseases classified elsewhere, moderate, without behavioral disturbance, psychotic disturbance, mood disturbance, and anxiety: Secondary | ICD-10-CM | POA: Diagnosis present

## 2022-07-06 DIAGNOSIS — E785 Hyperlipidemia, unspecified: Secondary | ICD-10-CM | POA: Diagnosis present

## 2022-07-06 DIAGNOSIS — Z8249 Family history of ischemic heart disease and other diseases of the circulatory system: Secondary | ICD-10-CM

## 2022-07-06 DIAGNOSIS — I5032 Chronic diastolic (congestive) heart failure: Secondary | ICD-10-CM | POA: Diagnosis present

## 2022-07-06 HISTORY — DX: Chronic diastolic (congestive) heart failure: I50.32

## 2022-07-06 HISTORY — DX: Benign prostatic hyperplasia without lower urinary tract symptoms: N40.0

## 2022-07-06 HISTORY — DX: Unspecified dementia, unspecified severity, without behavioral disturbance, psychotic disturbance, mood disturbance, and anxiety: F03.90

## 2022-07-06 LAB — COMPREHENSIVE METABOLIC PANEL
ALT: 11 U/L (ref 0–44)
AST: 19 U/L (ref 15–41)
Albumin: 4.2 g/dL (ref 3.5–5.0)
Alkaline Phosphatase: 68 U/L (ref 38–126)
Anion gap: 10 (ref 5–15)
BUN: 11 mg/dL (ref 8–23)
CO2: 29 mmol/L (ref 22–32)
Calcium: 9.8 mg/dL (ref 8.9–10.3)
Chloride: 93 mmol/L — ABNORMAL LOW (ref 98–111)
Creatinine, Ser: 1.29 mg/dL — ABNORMAL HIGH (ref 0.61–1.24)
GFR, Estimated: 56 mL/min — ABNORMAL LOW (ref >60)
Glucose, Bld: 119 mg/dL — ABNORMAL HIGH (ref 70–99)
Potassium: 3.9 mmol/L (ref 3.5–5.1)
Sodium: 132 mmol/L — ABNORMAL LOW (ref 135–145)
Total Bilirubin: 0.4 mg/dL (ref 0.3–1.2)
Total Protein: 8.5 g/dL — ABNORMAL HIGH (ref 6.5–8.1)

## 2022-07-06 LAB — CBC WITH DIFFERENTIAL/PLATELET
Abs Immature Granulocytes: 0.02 10*3/uL (ref 0.00–0.07)
Basophils Absolute: 0 10*3/uL (ref 0.0–0.1)
Basophils Relative: 0 %
Eosinophils Absolute: 0.1 10*3/uL (ref 0.0–0.5)
Eosinophils Relative: 1 %
HCT: 40.1 % (ref 39.0–52.0)
Hemoglobin: 13.4 g/dL (ref 13.0–17.0)
Immature Granulocytes: 0 %
Lymphocytes Relative: 22 %
Lymphs Abs: 1.5 10*3/uL (ref 0.7–4.0)
MCH: 27.3 pg (ref 26.0–34.0)
MCHC: 33.4 g/dL (ref 30.0–36.0)
MCV: 81.7 fL (ref 80.0–100.0)
Monocytes Absolute: 0.6 10*3/uL (ref 0.1–1.0)
Monocytes Relative: 9 %
Neutro Abs: 4.6 10*3/uL (ref 1.7–7.7)
Neutrophils Relative %: 68 %
Platelets: 359 10*3/uL (ref 150–400)
RBC: 4.91 MIL/uL (ref 4.22–5.81)
RDW: 14.5 % (ref 11.5–15.5)
WBC: 6.8 10*3/uL (ref 4.0–10.5)
nRBC: 0 % (ref 0.0–0.2)

## 2022-07-06 LAB — URINALYSIS, ROUTINE W REFLEX MICROSCOPIC
Bilirubin Urine: NEGATIVE
Glucose, UA: NEGATIVE mg/dL
Hgb urine dipstick: NEGATIVE
Ketones, ur: NEGATIVE mg/dL
Leukocytes,Ua: NEGATIVE
Nitrite: NEGATIVE
Protein, ur: NEGATIVE mg/dL
Specific Gravity, Urine: 1.016 (ref 1.005–1.030)
pH: 5.5 (ref 5.0–8.0)

## 2022-07-06 LAB — PROTIME-INR
INR: 1.1 (ref 0.8–1.2)
Prothrombin Time: 13.8 seconds (ref 11.4–15.2)

## 2022-07-06 LAB — LACTIC ACID, PLASMA
Lactic Acid, Venous: 2.1 mmol/L (ref 0.5–1.9)
Lactic Acid, Venous: 1.5 mmol/L (ref 0.5–1.9)

## 2022-07-06 LAB — APTT: aPTT: 26 seconds (ref 24–36)

## 2022-07-06 MED ORDER — VANCOMYCIN HCL IN DEXTROSE 1-5 GM/200ML-% IV SOLN
1000.0000 mg | INTRAVENOUS | Status: AC
  Administered 2022-07-06 – 2022-07-07 (×2): 1000 mg via INTRAVENOUS
  Filled 2022-07-06 (×2): qty 200

## 2022-07-06 MED ORDER — MORPHINE SULFATE (PF) 4 MG/ML IV SOLN
4.0000 mg | Freq: Once | INTRAVENOUS | Status: AC
  Administered 2022-07-06: 4 mg via INTRAVENOUS
  Filled 2022-07-06: qty 1

## 2022-07-06 MED ORDER — VANCOMYCIN HCL IN DEXTROSE 1-5 GM/200ML-% IV SOLN: 1000.0000 mg | Freq: Once | INTRAVENOUS | Status: DC

## 2022-07-06 MED ORDER — SODIUM CHLORIDE 0.9 % IV SOLN
2.0000 g | Freq: Two times a day (BID) | INTRAVENOUS | Status: DC
  Administered 2022-07-07: 2 g via INTRAVENOUS
  Filled 2022-07-06: qty 12.5

## 2022-07-06 MED ORDER — VANCOMYCIN HCL 1250 MG/250ML IV SOLN
1250.0000 mg | INTRAVENOUS | Status: DC
  Filled 2022-07-06: qty 250

## 2022-07-06 MED ORDER — ONDANSETRON HCL 4 MG/2ML IJ SOLN
4.0000 mg | Freq: Once | INTRAMUSCULAR | Status: AC
  Administered 2022-07-06: 4 mg via INTRAVENOUS
  Filled 2022-07-06: qty 2

## 2022-07-06 MED ORDER — LIDOCAINE 5 % EX PTCH
1.0000 | MEDICATED_PATCH | CUTANEOUS | Status: DC
  Administered 2022-07-06 – 2022-07-25 (×20): 1 via TRANSDERMAL
  Filled 2022-07-06 (×20): qty 1

## 2022-07-06 MED ORDER — SODIUM CHLORIDE 0.9 % IV SOLN
2.0000 g | Freq: Once | INTRAVENOUS | Status: AC
  Administered 2022-07-06: 2 g via INTRAVENOUS
  Filled 2022-07-06: qty 12.5

## 2022-07-06 NOTE — Progress Notes (Addendum)
Pharmacy Antibiotic Note  Eric Barrett. is a 81 y.o. 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  Pharmacy has been consulted for cefepime and vancomycin dosing.  Cefepime 2g IV x1 at 21:10 in the [facility] ED.  WBC 6.8, afebrile, vital signs stable SCr 1.29 (at baseline SCr ~1.2-1.3)  Plan: Initiate loading dose of Vancomycin '2000mg'$  IV x 1, followed by  Vancomycin '1250mg'$  IV q24h (eAUC ~536)    > Goal AUC 400-550    > Check vancomycin levels at steady state  Continue cefepime 2g IV q12h  Monitor daily CBC, temp, SCr, and for clinical signs of improvement  F/u cultures and de-escalate antibiotics as able   Height: '5\' 7"'$  (170.2 cm) Weight: 82.6 kg (182 lb) IBW/kg (Calculated) : 66.1  Temp (24hrs), Avg:98.2 F (36.8 C), Min:98.2 F (36.8 C), Max:98.2 F (36.8 C)  Recent Labs  Lab 07/06/22 2010 07/06/22 2209  WBC 6.8  --   CREATININE 1.29*  --   LATICACIDVEN 2.1* 1.5    Estimated Creatinine Clearance: 46.2 mL/min (A) (by C-G formula based on SCr of 1.29 mg/dL (H)).    No Known Allergies  Antimicrobials this admission: Cefepime 11/30 >>  Vancomycin 11/30 >>   Dose adjustments this admission: N/A  Microbiology results: 11/30 BCx: pending 11/30 UCx: pending   Thank you for allowing pharmacy to be a part of this patient's care.  Luisa Hart, PharmD, BCPS Clinical Pharmacist 07/06/2022 11:11 PM   Please refer to [facility] for pharmacy phone number

## 2022-07-06 NOTE — ED Provider Notes (Signed)
[facility] EMERGENCY DEPT Provider Note   CSN: [id number] Arrival date & time: 07/06/22  1945     History  Chief Complaint  Patient presents with   Back Pain    Eric Haik. is a 81 y.o. male. With pmh Alzheimer's dementia, HTN, HLD, chronic HFpEF, remote stroke with residual left-sided weakness, glaucoma, seizure disorder with recent admission and discharge for UTI, hyponatremia and proctitis presenting after having chronic ongoing worsening low back pain found to have reported osteomyelitis on his MRI L spine ordered by his PCP.  Patient went to rehab after his recent admission to the hospital and then went home.  Since going home his wife notes some worsening generalized fatigue and weakness and regression inability to get around.  He has been complaining of worsening lower lumbar spine but has had no recent traumatic injuries.  He has had no fevers, no abdominal pain, no chest pain, no shortness of breath, no vomiting or diarrhea.  He has been peeing often but denies any dysuria.  He completed his antibiotics for recent UTI.  Denies any difficulties urinating or going to the bathroom denies saddle anesthesia, focal weakness numbness or tingling in the lower extremities.  Due to his ongoing lower lumbar spine pain his PCP ordered an MRI T and L-spine which he had completed at [facility] in [place] and was called today by his PCP for concern for osteomyelitis in his L-spine.  He has never had injections in the spine and has never had surgery on the spine.   Back Pain      Home Medications Prior to Admission medications   Medication Sig Start Date End Date Taking? Authorizing Provider  acetaminophen (TYLENOL) 325 MG tablet Take 2 tablets (650 mg total) by mouth every 6 (six) hours as needed for mild pain, fever or headache. 05/24/22   Elgergawy, Silver Huguenin, MD  amLODipine (NORVASC) 10 MG tablet Take 10 mg by mouth daily. 07/08/21   [provider]  ASPIRIN 81 PO Take 81 mg by mouth daily.    [provider]  Cholecalciferol (VITAMIN D3) 25 MCG (1000 UT) CAPS Take 1,000 Units by mouth daily.    [provider]  ciprofloxacin (CIPRO) 500 MG tablet Take 1 tablet (500 mg total) by mouth 2 (two) times daily. 05/24/22   Elgergawy, Silver Huguenin, MD  divalproex (DEPAKOTE ER) 500 MG 24 hr tablet Take 1 tablet (500 mg total) by mouth at bedtime. 01/19/22   [name], MD  docusate sodium (COLACE) 100 MG capsule Take 100 mg by mouth daily.    [provider]  finasteride (PROSCAR) 5 MG tablet Take 5 mg by mouth daily. 08/24/21   [provider]  GAVILAX 17 GM/SCOOP powder Take 17 g by mouth daily. 04/26/22   [provider]  memantine (NAMENDA) 10 MG tablet Take 10 mg by mouth 2 (two) times daily. 02/08/22   [provider]  MOVANTIK 25 MG TABS tablet Take 25 mg by mouth daily. 05/04/22   [provider]  multivitamin-lutein (OCUVITE-LUTEIN) CAPS capsule Take 1 capsule by mouth daily.    [provider]  MYRBETRIQ 25 MG TB24 tablet Take 25 mg by mouth daily. 11/02/21   [provider]  rosuvastatin (CRESTOR) 20 MG tablet Take 20 mg by mouth daily.    [provider]  senna (SENOKOT) 8.6 MG TABS tablet Take 1 tablet (8.6 mg total) by mouth daily. 05/24/22   Elgergawy, Silver Huguenin, MD  tamsulosin (FLOMAX) 0.4 MG CAPS capsule Take 0.4 mg by mouth daily. 10/29/20   [provider]  Travoprost, BAK Free, (TRAVATAN) 0.004 % SOLN ophthalmic solution Place 1 drop into both eyes at bedtime.    [provider]      Allergies    Patient has no known allergies.    Review of Systems   Review of Systems  Musculoskeletal:  Positive for back pain.    Physical Exam Updated Vital Signs BP 124/77   Pulse 67   Temp 98.2 F (36.8 C) (Oral)   Resp 16   Ht '5\' 7"'$  (1.702 m)   Wt 82.6 kg   SpO2 97%   BMI 28.51 kg/m  Physical Exam Constitutional: Alert and no  acute distress nontoxic  eyes: Conjunctivae are normal. ENT      Mouth/Throat: Mucous membranes are dry.      Neck: No stridor. Cardiovascular: S1, S2,  Normal and symmetric distal pulses are present in all extremities.Warm and well perfused. Respiratory: Normal respiratory effort. Breath sounds are normal.  O2 sat 97 on RA Gastrointestinal: Soft and nontender. There is no CVA tenderness. Musculoskeletal: Mild trace equal nontender pitting edema of lower extremities extending to mid shin.  Tenderness to palpation of the mid line of the lower L-spine there is no external evidence of injury, no erythema, no warmth, no fluctuance, no surgical scars. Neurologic: Normal speech and language.  Equal 5 out of strength of bilateral feet dorsiflexion plantarflexion knee flexion and hip flexion.  Sensation grossly intact throughout lower extremities.  No facial droop.   Skin: Skin is warm, dry and intact. No rash noted. Psychiatric: Mood and affect are normal. Speech and behavior are normal.  ED Results / Procedures / Treatments   Labs (all labs ordered are listed, but only abnormal results are displayed) Labs Reviewed  LACTIC ACID, PLASMA - Abnormal; Notable for the following components:      Result Value   Lactic Acid, Venous 2.1 (*)    All other components within normal limits  COMPREHENSIVE METABOLIC PANEL - Abnormal; Notable for the following components:   Sodium 132 (*)    Chloride 93 (*)    Glucose, Bld 119 (*)    Creatinine, Ser 1.29 (*)    Total Protein 8.5 (*)    GFR, Estimated 56 (*)    All other components within normal limits  CULTURE, BLOOD (ROUTINE X 2)  CULTURE, BLOOD (ROUTINE X 2)  URINE CULTURE  LACTIC ACID, PLASMA  CBC WITH DIFFERENTIAL/PLATELET  PROTIME-INR  APTT  URINALYSIS, ROUTINE W REFLEX MICROSCOPIC  CBC  BASIC METABOLIC PANEL    EKG None  Radiology DG Chest Port 1 View  Result Date: 07/06/2022 CLINICAL DATA:  Back pain. EXAM: PORTABLE CHEST 1 VIEW  COMPARISON:  None Available. FINDINGS: The heart size and mediastinal contours are within normal limits. There is no evidence of an acute infiltrate, pleural effusion or pneumothorax. Degenerative changes are seen involving both shoulders. Multilevel degenerative changes are noted throughout the thoracic spine. IMPRESSION: No active disease. Electronically Signed   By: Virgina Norfolk M.D.   On: 07/06/2022 20:35    Procedures .Critical Care  Performed by: Elgie Congo, MD Authorized by: Elgie Congo, MD   Critical care provider statement:    Critical care time (minutes):  30   Critical care was necessary to treat or prevent imminent or life-threatening deterioration of the following conditions:  Sepsis   Critical care was time spent personally by me  on the following activities:  Development of treatment plan with patient or surrogate, discussions with consultants, evaluation of patient's response to treatment, examination of patient, ordering and review of laboratory studies, ordering and review of radiographic studies, ordering and performing treatments and interventions, pulse oximetry, re-evaluation of patient's condition, review of old charts and obtaining history from patient or surrogate   Care discussed with: admitting provider      Medications Ordered in ED Medications  lidocaine (LIDODERM) 5 % 1 patch (1 patch Transdermal Patch Applied 07/06/22 2240)  vancomycin (VANCOCIN) IVPB 1000 mg/200 mL premix (1,000 mg Intravenous New Bag/Given 07/06/22 2341)  vancomycin (VANCOREADY) IVPB 1250 mg/250 mL (has no administration in time range)  ceFEPIme (MAXIPIME) 2 g in sodium chloride 0.9 % 100 mL IVPB (has no administration in time range)  ceFEPIme (MAXIPIME) 2 g in sodium chloride 0.9 % 100 mL IVPB (0 g Intravenous Stopped 07/06/22 2153)  morphine (PF) 4 MG/ML injection 4 mg (4 mg Intravenous Given 07/06/22 2240)  ondansetron (ZOFRAN) injection 4 mg (4 mg Intravenous Given  07/06/22 2240)    ED Course/ Medical Decision Making/ A&P Clinical Course as of 07/07/22 0011  Thu Jul 06, 2022  2159 S/w on call radiologist Dr [name] who said our radiologists cannot overread outside reports, he looked extensively into outside reports but was unable to find read or images of MRI T/L Spine. Patient does have his CD with him but our staff are unable to upload imaging here. Radiologist said CT with contrast would be unhelpful. [VB]  2342 Accepted by Dr Velia Meyer for further workup including  [VB]    Clinical Course User Index [VB] Elgie Congo, MD                           Medical Decision Making Eric Cappuccio. is a 81 y.o. male. With pmh Alzheimer's dementia, HTN, HLD, chronic HFpEF, remote stroke with residual left-sided weakness, glaucoma, seizure disorder with recent admission and discharge for UTI, hyponatremia and proctitis presenting after having chronic ongoing worsening low back pain found to have reported osteomyelitis on his MRI L spine ordered by his PCP.    Patient presented tachycardic 100 but otherwise hemodynamically stable.  Due to concern for osteomyelitis of the L-spine, sepsis workup was started.  Patient presents with CD of T and L-spine MRI but unable to upload here.  Had [facility] attempt to [place] imaging and results with [place] by been unable to read.  S/w on call radiologist Dr [name] who said our radiologists cannot overread outside reports, he looked extensively into outside reports but was unable to find read or images of MRI T/L Spine. Patient does have his CD with him but our staff are unable to upload imaging here. Radiologist said CT with contrast would be unhelpful.  Labs showed normal white blood cell count 6.8.  Mild hyponatremia 132.  Creatinine 1.29 at baseline.  He did have elevated lactate 2.1.  UA without evidence of UTI and chest x-ray which I personally reviewed showed no pneumonia.  Due to concern for possible osteomyelitis of  the spine although unable to get confirmation started patient on vancomycin and cefepime.  Will need repeat MRI T and L-spine if unable to get outside imaging uploaded.  Discussed with Dr Velia Meyer who has accepted for further medical management and workup.   Amount and/or Complexity of Data Reviewed Labs: ordered. Radiology: ordered. ECG/medicine tests: ordered.  Risk Prescription drug management. Decision  regarding hospitalization.    Final Clinical Impression(s) / ED Diagnoses Final diagnoses:  Midline low back pain without sciatica, unspecified chronicity  Lactic acidosis    Rx / DC Orders ED Discharge Orders     None         Elgie Congo, MD 07/07/22 0011

## 2022-07-06 NOTE — ED Notes (Signed)
IV established, blood drawn, one set of BC collected

## 2022-07-06 NOTE — ED Triage Notes (Signed)
[facility] transport, pt via wheelchair. Pt wife sts that PCP called today for f/u with MRI results that are concerning for osteomyelitis in lumbar spine. Pt sts that after coming back from rehab and going home, pt has had increasing back pain and mobility issues. Pt alert and oriented x 4, denies weakness, dizziness or other symptoms only sts lower back pain.

## 2022-07-07 ENCOUNTER — Inpatient Hospital Stay: Payer: Self-pay

## 2022-07-07 ENCOUNTER — Encounter (HOSPITAL_COMMUNITY): Payer: Self-pay | Admitting: Internal Medicine

## 2022-07-07 DIAGNOSIS — G301 Alzheimer's disease with late onset: Secondary | ICD-10-CM | POA: Diagnosis not present

## 2022-07-07 DIAGNOSIS — M4626 Osteomyelitis of vertebra, lumbar region: Secondary | ICD-10-CM | POA: Diagnosis present

## 2022-07-07 DIAGNOSIS — I5032 Chronic diastolic (congestive) heart failure: Secondary | ICD-10-CM | POA: Diagnosis present

## 2022-07-07 DIAGNOSIS — I10 Essential (primary) hypertension: Secondary | ICD-10-CM | POA: Diagnosis present

## 2022-07-07 DIAGNOSIS — I69354 Hemiplegia and hemiparesis following cerebral infarction affecting left non-dominant side: Secondary | ICD-10-CM | POA: Diagnosis not present

## 2022-07-07 DIAGNOSIS — Z87891 Personal history of nicotine dependence: Secondary | ICD-10-CM | POA: Diagnosis not present

## 2022-07-07 DIAGNOSIS — H409 Unspecified glaucoma: Secondary | ICD-10-CM | POA: Diagnosis present

## 2022-07-07 DIAGNOSIS — N400 Enlarged prostate without lower urinary tract symptoms: Secondary | ICD-10-CM | POA: Diagnosis present

## 2022-07-07 DIAGNOSIS — Z87440 Personal history of urinary (tract) infections: Secondary | ICD-10-CM | POA: Diagnosis not present

## 2022-07-07 DIAGNOSIS — E785 Hyperlipidemia, unspecified: Secondary | ICD-10-CM | POA: Diagnosis present

## 2022-07-07 DIAGNOSIS — N1831 Chronic kidney disease, stage 3a: Secondary | ICD-10-CM | POA: Diagnosis present

## 2022-07-07 DIAGNOSIS — Z8249 Family history of ischemic heart disease and other diseases of the circulatory system: Secondary | ICD-10-CM | POA: Diagnosis not present

## 2022-07-07 DIAGNOSIS — F02B0 Dementia in other diseases classified elsewhere, moderate, without behavioral disturbance, psychotic disturbance, mood disturbance, and anxiety: Secondary | ICD-10-CM | POA: Diagnosis present

## 2022-07-07 DIAGNOSIS — M4807 Spinal stenosis, lumbosacral region: Secondary | ICD-10-CM | POA: Diagnosis present

## 2022-07-07 DIAGNOSIS — K5900 Constipation, unspecified: Secondary | ICD-10-CM | POA: Diagnosis present

## 2022-07-07 DIAGNOSIS — Z79899 Other long term (current) drug therapy: Secondary | ICD-10-CM | POA: Diagnosis not present

## 2022-07-07 DIAGNOSIS — F03B18 Unspecified dementia, moderate, with other behavioral disturbance: Secondary | ICD-10-CM | POA: Diagnosis not present

## 2022-07-07 DIAGNOSIS — G40909 Epilepsy, unspecified, not intractable, without status epilepticus: Secondary | ICD-10-CM | POA: Diagnosis present

## 2022-07-07 DIAGNOSIS — I130 Hypertensive heart and chronic kidney disease with heart failure and stage 1 through stage 4 chronic kidney disease, or unspecified chronic kidney disease: Secondary | ICD-10-CM | POA: Diagnosis present

## 2022-07-07 DIAGNOSIS — F02A0 Dementia in other diseases classified elsewhere, mild, without behavioral disturbance, psychotic disturbance, mood disturbance, and anxiety: Secondary | ICD-10-CM | POA: Diagnosis not present

## 2022-07-07 DIAGNOSIS — E222 Syndrome of inappropriate secretion of antidiuretic hormone: Secondary | ICD-10-CM | POA: Diagnosis present

## 2022-07-07 DIAGNOSIS — E875 Hyperkalemia: Secondary | ICD-10-CM | POA: Diagnosis present

## 2022-07-07 DIAGNOSIS — G309 Alzheimer's disease, unspecified: Secondary | ICD-10-CM | POA: Diagnosis present

## 2022-07-07 DIAGNOSIS — M4627 Osteomyelitis of vertebra, lumbosacral region: Secondary | ICD-10-CM | POA: Diagnosis present

## 2022-07-07 DIAGNOSIS — M4647 Discitis, unspecified, lumbosacral region: Secondary | ICD-10-CM | POA: Diagnosis present

## 2022-07-07 DIAGNOSIS — M545 Low back pain, unspecified: Secondary | ICD-10-CM | POA: Diagnosis present

## 2022-07-07 DIAGNOSIS — I159 Secondary hypertension, unspecified: Secondary | ICD-10-CM | POA: Diagnosis not present

## 2022-07-07 DIAGNOSIS — G8929 Other chronic pain: Secondary | ICD-10-CM | POA: Diagnosis present

## 2022-07-07 DIAGNOSIS — Z23 Encounter for immunization: Secondary | ICD-10-CM | POA: Diagnosis present

## 2022-07-07 DIAGNOSIS — Z8619 Personal history of other infectious and parasitic diseases: Secondary | ICD-10-CM | POA: Diagnosis not present

## 2022-07-07 LAB — CBC
HCT: 32.7 % — ABNORMAL LOW (ref 39.0–52.0)
Hemoglobin: 11.2 g/dL — ABNORMAL LOW (ref 13.0–17.0)
MCH: 27.9 pg (ref 26.0–34.0)
MCHC: 34.3 g/dL (ref 30.0–36.0)
MCV: 81.5 fL (ref 80.0–100.0)
Platelets: 301 10*3/uL (ref 150–400)
RBC: 4.01 MIL/uL — ABNORMAL LOW (ref 4.22–5.81)
RDW: 14.2 % (ref 11.5–15.5)
WBC: 6.2 10*3/uL (ref 4.0–10.5)
nRBC: 0 % (ref 0.0–0.2)

## 2022-07-07 LAB — BASIC METABOLIC PANEL
Anion gap: 6 (ref 5–15)
BUN: 8 mg/dL (ref 8–23)
CO2: 27 mmol/L (ref 22–32)
Calcium: 9 mg/dL (ref 8.9–10.3)
Chloride: 99 mmol/L (ref 98–111)
Creatinine, Ser: 1.12 mg/dL (ref 0.61–1.24)
GFR, Estimated: >60 mL/min (ref >60)
Glucose, Bld: 115 mg/dL — ABNORMAL HIGH (ref 70–99)
Potassium: 4.1 mmol/L (ref 3.5–5.1)
Sodium: 132 mmol/L — ABNORMAL LOW (ref 135–145)

## 2022-07-07 LAB — SEDIMENTATION RATE: Sed Rate: 14 mm/hr (ref 0–16)

## 2022-07-07 LAB — C-REACTIVE PROTEIN: CRP: <0.5 mg/dL (ref <1.0)

## 2022-07-07 MED ORDER — ORAL CARE MOUTH RINSE: 15.0000 mL | OROMUCOSAL | Status: DC | PRN

## 2022-07-07 MED ORDER — AMLODIPINE BESYLATE 10 MG PO TABS
10.0000 mg | ORAL_TABLET | Freq: Every day | ORAL | Status: DC
  Administered 2022-07-07 – 2022-07-26 (×20): 10 mg via ORAL
  Filled 2022-07-07 (×20): qty 1

## 2022-07-07 MED ORDER — ONDANSETRON HCL 4 MG PO TABS: 4.0000 mg | ORAL_TABLET | Freq: Four times a day (QID) | ORAL | Status: DC | PRN

## 2022-07-07 MED ORDER — LOSARTAN POTASSIUM 50 MG PO TABS
50.0000 mg | ORAL_TABLET | Freq: Every day | ORAL | Status: DC
  Administered 2022-07-07 – 2022-07-12 (×6): 50 mg via ORAL
  Filled 2022-07-07 (×6): qty 1

## 2022-07-07 MED ORDER — ROSUVASTATIN CALCIUM 20 MG PO TABS
20.0000 mg | ORAL_TABLET | Freq: Every day | ORAL | Status: DC
  Administered 2022-07-07 – 2022-07-26 (×20): 20 mg via ORAL
  Filled 2022-07-07 (×20): qty 1

## 2022-07-07 MED ORDER — ASPIRIN 81 MG PO CHEW
81.0000 mg | CHEWABLE_TABLET | Freq: Every day | ORAL | Status: DC
  Administered 2022-07-07 – 2022-07-26 (×19): 81 mg via ORAL
  Filled 2022-07-07 (×20): qty 1

## 2022-07-07 MED ORDER — MIRABEGRON ER 25 MG PO TB24
25.0000 mg | ORAL_TABLET | Freq: Every day | ORAL | Status: DC
  Administered 2022-07-08 – 2022-07-26 (×19): 25 mg via ORAL
  Filled 2022-07-07 (×21): qty 1

## 2022-07-07 MED ORDER — SENNOSIDES-DOCUSATE SODIUM 8.6-50 MG PO TABS
1.0000 | ORAL_TABLET | Freq: Two times a day (BID) | ORAL | Status: DC
  Administered 2022-07-07 – 2022-07-26 (×36): 1 via ORAL
  Filled 2022-07-07 (×36): qty 1

## 2022-07-07 MED ORDER — DIVALPROEX SODIUM ER 500 MG PO TB24
500.0000 mg | ORAL_TABLET | Freq: Every day | ORAL | Status: DC
  Administered 2022-07-07 – 2022-07-25 (×19): 500 mg via ORAL
  Filled 2022-07-07 (×19): qty 1

## 2022-07-07 MED ORDER — TAMSULOSIN HCL 0.4 MG PO CAPS
0.4000 mg | ORAL_CAPSULE | Freq: Every day | ORAL | Status: DC
  Administered 2022-07-07 – 2022-07-26 (×20): 0.4 mg via ORAL
  Filled 2022-07-07 (×20): qty 1

## 2022-07-07 MED ORDER — ACETAMINOPHEN 650 MG RE SUPP: 650.0000 mg | Freq: Four times a day (QID) | RECTAL | Status: DC | PRN

## 2022-07-07 MED ORDER — BISACODYL 5 MG PO TBEC
5.0000 mg | DELAYED_RELEASE_TABLET | Freq: Every day | ORAL | Status: DC | PRN
  Administered 2022-07-09 – 2022-07-15 (×2): 5 mg via ORAL
  Filled 2022-07-07 (×2): qty 1

## 2022-07-07 MED ORDER — MORPHINE SULFATE (PF) 2 MG/ML IV SOLN
2.0000 mg | INTRAVENOUS | Status: DC | PRN
  Administered 2022-07-10 – 2022-07-18 (×5): 2 mg via INTRAVENOUS
  Filled 2022-07-07 (×5): qty 1

## 2022-07-07 MED ORDER — MEMANTINE HCL 10 MG PO TABS
10.0000 mg | ORAL_TABLET | Freq: Two times a day (BID) | ORAL | Status: DC
  Administered 2022-07-07 – 2022-07-26 (×38): 10 mg via ORAL
  Filled 2022-07-07 (×41): qty 1

## 2022-07-07 MED ORDER — POLYETHYLENE GLYCOL 3350 17 G PO PACK
17.0000 g | PACK | Freq: Every day | ORAL | Status: DC | PRN
  Filled 2022-07-07: qty 1

## 2022-07-07 MED ORDER — ONDANSETRON HCL 4 MG/2ML IJ SOLN: 4.0000 mg | Freq: Four times a day (QID) | INTRAMUSCULAR | Status: DC | PRN

## 2022-07-07 MED ORDER — HYDRALAZINE HCL 20 MG/ML IJ SOLN: 5.0000 mg | INTRAMUSCULAR | Status: DC | PRN

## 2022-07-07 MED ORDER — LATANOPROST 0.005 % OP SOLN
1.0000 [drp] | Freq: Every day | OPHTHALMIC | Status: DC
  Administered 2022-07-07 – 2022-07-25 (×19): 1 [drp] via OPHTHALMIC
  Filled 2022-07-07: qty 2.5

## 2022-07-07 MED ORDER — OXYCODONE HCL 5 MG PO TABS
5.0000 mg | ORAL_TABLET | ORAL | Status: DC | PRN
  Administered 2022-07-08 – 2022-07-26 (×28): 5 mg via ORAL
  Filled 2022-07-07 (×28): qty 1

## 2022-07-07 MED ORDER — FINASTERIDE 5 MG PO TABS
5.0000 mg | ORAL_TABLET | Freq: Every day | ORAL | Status: DC
  Administered 2022-07-07 – 2022-07-26 (×20): 5 mg via ORAL
  Filled 2022-07-07 (×20): qty 1

## 2022-07-07 MED ORDER — TRAMADOL HCL 50 MG PO TABS
50.0000 mg | ORAL_TABLET | Freq: Three times a day (TID) | ORAL | Status: DC | PRN
  Administered 2022-07-09 – 2022-07-16 (×4): 50 mg via ORAL
  Filled 2022-07-07 (×4): qty 1

## 2022-07-07 MED ORDER — ACETAMINOPHEN 325 MG PO TABS: 650.0000 mg | ORAL_TABLET | Freq: Four times a day (QID) | ORAL | Status: DC | PRN

## 2022-07-07 NOTE — Consult Note (Signed)
[facility]    Date of Admission:  07/06/2022     Reason for Consult: mri suggestion spine om    Referring Provider: Karmen Bongo   Abx: 11/30 vanc 11/30 cefepime        Assessment: 81 yo male with chart history dementia, chronic HFpEF, [facility] for concern of [facility] mri thoracolumbar spine showing changes suggestive of osteomyelitis in setting acute back pain  He was given a dose of vanc/cefepime. Given already poor sampling rate of needle guided biopsy, would recommend to hold at least 4 days further abx until culture can be obtained from bone, as he doesn't show sign of sepsis or hemodynamic instability now  Will also get esr/crp to help with dx. Sometimes mri could be over-called/nonspecific and if esr/crp normal potentially we could monitor  Also, if blood culture grow staph aureus, we could forgo mri and treat as presumed staph aureus bone involvement   Plan: Agree with IR guided biopsy I do not feel need to send any atypical infectious serology at this time Given no sign of sepsis/hd instability, hold abx and await ir biopsy of involved bone/spine.... ideally if could wait at least 4 days after the dose of abx would be great Await mri transfer from [facility] Order esr/crp. If this is completely normal, we might opt to monitor rather than starting immediate treatment (unless biopsy culture grow some organism) Follow up blood culture Discussed with primary team    I spent 75 minute reviewing data/chart, and coordinating care and >50% direct face to face time providing counseling/discussing diagnostics/treatment plan with patient       ------------------------------------------------ Principal Problem:   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HCC)    HPI: Eric Theiss. is a 81 y.o. male with dementia, chronic HFpEF, [facility] for concern of [facility] mri thoracolumbar spine showing  changes suggestive of osteomyelitis in setting acute back pain  Hx via chart. Patient has chart hx of dementia but was able to corroborate story to me  Patient has been exhibiting worsening acute on chronic lower back pain for 1-2 months with the last 2 weeks pain becoming more excruciating He denies f/c/nightsweat No pets/animals No travel outside of [place]  No LE paresthesia/dysesthesia No bowel/bladder incontinence  His pcp at [facility] obtained thoracolumbar mri 11/28 and he was told to f/u ed once result suggest lumbo-sacro spine om  I have no actual report of imaging. Radiology is awaiting transfer of imaging for review  He said the last 2-3 days it hurts too much to turn/sit up  Per primary team, patient was given cipro on 10/12 for uti to cover methicillin resistant staph epi in the culture  Afebrile this admission No leukocytosis  Primary team had called ir for needle biopsy of involved bone Bcx obtained and ngtd He was given a dose vanc/cefepime   Family History  Problem Relation Age of Onset   Heart attack Mother    Heart attack Father     Social History   Tobacco Use   Smoking status: Former    Types: Cigars    Quit date: 1985    Years since quitting: 38.9   Smokeless tobacco: Never  Substance Use Topics   Alcohol use: Not Currently   Drug use: Never    No Known Allergies  Review of Systems: ROS All Other ROS was negative, except mentioned above   Past Medical  History:  Diagnosis Date   CHF (congestive heart failure) (HCC)    Dementia (HCC)    Glaucoma    Hemiparesis of left nondominant side (HCC)    History of shingles    HLD (hyperlipidemia)    Hypertension    Stroke (HCC)    Tremor        Scheduled Meds:  amLODipine  10 mg Oral Daily   aspirin  81 mg Oral Daily   divalproex  500 mg Oral QHS   finasteride  5 mg Oral Daily   latanoprost  1 drop Both Eyes QHS   lidocaine  1 patch Transdermal Q24H   losartan  50 mg  Oral Daily   memantine  10 mg Oral BID   mirabegron ER  25 mg Oral Daily   rosuvastatin  20 mg Oral Daily   senna-docusate  1 tablet Oral BID   tamsulosin  0.4 mg Oral Daily   Continuous Infusions: PRN Meds:.acetaminophen **OR** acetaminophen, bisacodyl, hydrALAZINE, morphine injection, ondansetron **OR** ondansetron (ZOFRAN) IV, oxyCODONE, polyethylene glycol, traMADol   OBJECTIVE: Blood pressure 112/67, pulse 65, temperature 98.3 F (36.8 C), temperature source Oral, resp. rate 20, height _0  (1.702 m), weight 82.6 kg, SpO2 98 %.  Physical Exam  General/constitutional: no distress, pleasant, conversant, no distress HEENT: Normocephalic, PER, Conj Clear, EOMI, Oropharynx clear Neck supple CV: rrr no mrg Lungs: clear to auscultation, normal respiratory effort Abd: Soft, Nontender Ext: no edema Skin: No Rash Neuro: moving bilateral LE well but appears to have tenderness when doing it; "October 2023" "we are in hospital" MSK: no peripheral joint swelling/tenderness/warmth; tender mid lumbar spine   Lab Results Lab Results  Component Value Date   WBC 6.2 07/07/2022   HGB 11.2 (L) 07/07/2022   HCT 32.7 (L) 07/07/2022   MCV 81.5 07/07/2022   PLT 301 07/07/2022    Lab Results  Component Value Date   CREATININE 1.12 07/07/2022   BUN 8 07/07/2022   NA 132 (L) 07/07/2022   K 4.1 07/07/2022   CL 99 07/07/2022   CO2 27 07/07/2022    Lab Results  Component Value Date   ALT 11 07/06/2022   AST 19 07/06/2022   ALKPHOS 68 07/06/2022   BILITOT 0.4 07/06/2022      Microbiology: Recent Results (from the past 240 hour(s))  Blood Culture (routine x 2)     Status: None (Preliminary result)   Collection Time: 07/06/22  8:10 PM   Specimen: BLOOD  Result Value Ref Range Status   Specimen Description   Final    BLOOD LEFT ANTECUBITAL Performed at [facility], [address] [place], [place] [address]    Special Requests   Final    Blood Culture  adequate volume BOTTLES DRAWN AEROBIC AND ANAEROBIC Performed at [facility], [address], [place], [place] [address]    Culture   Final    NO GROWTH < 12 HOURS Performed at [facility] [address], [place],  [address]    Report Status PENDING  Incomplete     Serology:    Imaging: If present, new imagings (plain films, ct scans, and mri) have been personally visualized and interpreted; radiology reports have been reviewed. Decision making incorporated into the Impression / Recommendations.  11/30 cxr No active disease  Jabier Mutton, MD [facility] [place] [phone] pager    07/07/2022, 11:50 AM

## 2022-07-07 NOTE — Progress Notes (Signed)
   07/07/22 1215  Clinical Encounter Type  Visited With Patient not available  Visit Type Initial;Other (Comment) (Advanced Directive)  Referral From Nurse  Consult/Referral To Chaplain   Chaplain responded to a spiritual consult for advanced directive education. The patient was sleeping at the time I my visit. I will continue to monitor.   Danice Goltz [facility]  [phone]

## 2022-07-07 NOTE — ED Notes (Signed)
[facility] report given to [facility], RN and [facility] at this time.

## 2022-07-07 NOTE — Consult Note (Signed)
Chief Complaint: Concern for osteomyelitis. Request is for L5/S1  disc aspiration.   Referring Physician(s): Dr. Karmen Bongo  Supervising Physician: Luanne Bras  Patient Status: [facility] - In-pt  History of Present Illness: Eric Barrett. is a 81 y.o. male inpatient. History of Alzheimer dementia, HTN., HLD, CHF, CVA with residual left sided weakness, seizures. Found to have concern for osteomyelitis on outpatient imaging. Patient presented to the ED at [facility] with worsening fatigue weakness and lower lumbar spins pain. Patient was transported to [facility] for further evaluation and possible intervention. Team is requesting lumbar  5/S1 aspiration for further evaluation for possible osteomyelitis.   Currently without any significant complaints. Patient alert and laying in bed,calm.  Endorses improving back pain Denies any fevers, headache, chest pain, SOB, cough, abdominal pain, nausea, vomiting or bleeding. Return precautions and treatment recommendations and follow-up discussed with the patient and his wife both who are  agreeable with the plan.    Past Medical History:  Diagnosis Date   CHF (congestive heart failure) (HCC)    Dementia (HCC)    Glaucoma    Hemiparesis of left nondominant side (HCC)    History of shingles    HLD (hyperlipidemia)    Hypertension    Stroke [facility])    Tremor     Past Surgical History:  Procedure Laterality Date   COLONOSCOPY WITH PROPOFOL N/A 05/24/2022   Procedure: COLONOSCOPY WITH PROPOFOL;  Surgeon: Ladene Artist, MD;  Location: [place];  Service: Gastroenterology;  Laterality: N/A;   No past surgery     POLYPECTOMY  05/24/2022   Procedure: POLYPECTOMY;  Surgeon: Ladene Artist, MD;  Location: [facility] ENDOSCOPY;  Service: Gastroenterology;;    Allergies: Patient has no known allergies.  Medications: Prior to Admission medications   Medication Sig Start Date End Date Taking? Authorizing Provider  acetaminophen  (TYLENOL) 325 MG tablet Take 2 tablets (650 mg total) by mouth every 6 (six) hours as needed for mild pain, fever or headache. 05/24/22  Yes Elgergawy, Silver Huguenin, MD  amLODipine (NORVASC) 10 MG tablet Take 10 mg by mouth daily. 07/08/21  Yes [provider]  ASPIRIN 81 PO Take 81 mg by mouth daily.   Yes [provider]  Cholecalciferol (VITAMIN D3) 25 MCG (1000 UT) CAPS Take 1,000 Units by mouth daily.   Yes [provider]  divalproex (DEPAKOTE ER) 500 MG 24 hr tablet Take 1 tablet (500 mg total) by mouth at bedtime. 01/19/22  Yes [name], MD  docusate sodium (COLACE) 100 MG capsule Take 100 mg by mouth daily.   Yes [provider]  finasteride (PROSCAR) 5 MG tablet Take 5 mg by mouth daily. 08/24/21  Yes [provider]  GAVILAX 17 GM/SCOOP powder Take 17 g by mouth daily as needed for mild constipation. 04/26/22  Yes [provider]  losartan (COZAAR) 50 MG tablet Take 50 mg by mouth daily.   Yes [provider]  memantine (NAMENDA) 10 MG tablet Take 10 mg by mouth 2 (two) times daily. 02/08/22  Yes [provider]  MOVANTIK 25 MG TABS tablet Take 25 mg by mouth daily. 05/04/22  Yes [provider]  multivitamin-lutein (OCUVITE-LUTEIN) CAPS capsule Take 1 capsule by mouth daily.   Yes [provider]  MYRBETRIQ 25 MG TB24 tablet Take 25 mg by mouth daily. 11/02/21  Yes [provider]  rosuvastatin (CRESTOR) 20 MG tablet Take 20 mg by mouth daily.   Yes [provider]  senna (SENOKOT) 8.6 MG TABS tablet Take 1 tablet (8.6 mg total) by mouth daily. Patient taking differently: Take 1 tablet by mouth in the morning and at bedtime. 05/24/22  Yes Elgergawy, Silver Huguenin, MD  tamsulosin (FLOMAX) 0.4 MG CAPS capsule Take 0.4 mg by mouth daily. 10/29/20  Yes [provider]  traMADol (ULTRAM) 50 MG tablet Take 50 mg by mouth every 8 (eight) hours as needed for severe pain. 06/30/22  Yes [provider]  Travoprost, BAK Free, (TRAVATAN) 0.004 % SOLN ophthalmic solution Place 1 drop into both eyes at bedtime.   Yes [provider]     Family History  Problem Relation Age of Onset   Heart attack Mother    Heart attack Father     Social History   Socioeconomic History   Marital status: Married    Spouse name: [name]   Number of children: 0   Years of education: 12   Highest education level: High school graduate  Occupational History   Occupation: Retired  Tobacco Use   Smoking status: Former    Types: Cigars    Quit date: 1985    Years since quitting: 38.9   Smokeless tobacco: Never  Substance and Sexual Activity   Alcohol use: Not Currently   Drug use: Never   Sexual activity: Not on file  Other Topics Concern   Not on file  Social History Narrative   Lives at home with his wife.   Right-handed.   One cup coffee per day.   Social Determinants of Health   Financial Resource Strain: Not on file  Food Insecurity: No Food Insecurity (05/15/2022)   Hunger Vital Sign    Worried About Running Out of Food in the Last Year: Never true    Ran Out of Food in the Last Year: Never true  Transportation Needs: No Transportation Needs (05/15/2022)   PRAPARE - Hydrologist (Medical): No    Lack of Transportation (Non-Medical): No  Physical Activity: Not on file  Stress: Not on file  Social Connections: Not on file     Review of Systems: A 12 point ROS discussed and pertinent positives are indicated in the HPI above.  All other systems are negative.  Review of Systems  Vital Signs: BP 112/67 (BP Location: Right Arm)   Pulse 65   Temp 98.3 F (36.8 C) (Oral)   Resp 20   Ht '5\' 7"'$  (1.702 m)   Wt 182 lb (82.6 kg)   SpO2 98%   BMI 28.51 kg/m    Physical Exam Vitals and nursing note reviewed.  Constitutional:      Appearance: He is well-developed.  HENT:     Head: Normocephalic.  Cardiovascular:     Rate and Rhythm:  Normal rate and regular rhythm.     Heart sounds: Normal heart sounds.  Pulmonary:     Effort: Pulmonary effort is normal.     Breath sounds: Normal breath sounds.  Musculoskeletal:        General: Normal range of motion.     Cervical back: Normal range of motion.  Skin:    General: Skin is dry.  Neurological:     Mental Status: He is alert and oriented to person, place, and time.     Imaging: [place] EKG SITE RITE  Result Date: 07/07/2022 If Site Rite image not attached, placement could not be confirmed due to current cardiac rhythm.  DG Chest [facility] 1 [address]  Result Date: 07/06/2022 CLINICAL DATA:  Back pain. EXAM: PORTABLE CHEST 1 VIEW COMPARISON:  None Available. FINDINGS: The heart size and mediastinal contours are within normal limits. There is no evidence of an acute infiltrate, pleural effusion or pneumothorax. Degenerative changes are seen involving both shoulders. Multilevel degenerative changes are noted throughout the thoracic spine. IMPRESSION: No active disease. Electronically Signed   By: Virgina Norfolk M.D.   On: 07/06/2022 20:35    Labs:  CBC: Recent Labs    05/23/22 0344 05/24/22 0425 07/06/22 2010 07/07/22 0438  WBC 10.1 8.9 6.8 6.2  HGB 12.7* 11.8* 13.4 11.2*  HCT 37.6* 34.7* 40.1 32.7*  PLT 259 250 359 301    COAGS: Recent Labs    07/06/22 2010  INR 1.1  APTT 26    BMP: Recent Labs    05/23/22 0344 05/24/22 0425 07/06/22 2010 07/07/22 0438  NA 141 142 132* 132*  K 4.0 3.5 3.9 4.1  CL 102 108 93* 99  CO2 '30 28 29 27  '$ GLUCOSE 124* 124* 119* 115*  BUN '17 9 11 8  '$ CALCIUM 9.0 8.8* 9.8 9.0  CREATININE 1.49* 1.31* 1.29* 1.12  GFRNONAA 47* 55* 56* >60    LIVER FUNCTION TESTS: Recent Labs    05/11/22 0913 05/15/22 1055 05/15/22 1055 05/17/22 0322 05/18/22 0401 05/22/22 0509 05/23/22 0344 05/24/22 0425 07/06/22 2010  BILITOT 0.4 0.5  --  0.5  --   --   --   --  0.4  AST 38 30  --  26  --   --   --   --  19  ALT 21 19  --  18  --    --   --   --  11  ALKPHOS 53 41  --  39  --   --   --   --  68  PROT 7.2 7.0  --  5.6*  --   --   --   --  8.5*  ALBUMIN 4.2 3.5   < > 2.6*   < > 2.6* 2.7* 2.4* 4.2   < > = values in this interval not displayed.    Assessment and Plan:  81 y.o. male inpatient. History of Alzheimer dementia, HTN., HLD, CHF, CVA with residual left sided weakness, seizures. Found to have concern for osteomyelitis on outpatient imaging. Patient presented to the ED at [facility] with worsening fatigue weakness and lower lumbar spins pain. Patient was transported to [facility] for further evaluation and possible intervention. Team is requesting lumbar  5/S1 aspiration for further evaluation for possible osteomyelitis.   Lactic Acid originally 2.1. No leukocytosis. Patient is a febrile. Patient is on 81 mg of ASA per Dr. Luanne Bras medication does not need to be held. NKDA.   IR consulted for possible L5/S1 disc aspiration. Case has been reviewed and procedure approved by Dr. Luanne Bras. Patient tentatively scheduled for 12.4.23.  Team instructed to: Keep Patient to be NPO after midnight  IR will call patient when ready.  Risks and benefits of L5/S1 disc aspiration was discussed with the patient's wife including, but not limited to bleeding, infection, damage to adjacent structures or low yield requiring additional tests.  All of the questions were answered and there is agreement to proceed.  Consent signed and in  IR control room  Thank you for this interesting consult.  I greatly enjoyed meeting Doc Mandala. and look forward to participating in their care.  A copy of  this report was sent to the requesting provider on this date.  Electronically Signed: Jacqualine Mau, NP 07/07/2022, 3:16 PM   I spent a total of 40 Minutes   in face to face in clinical consultation, greater than 50% of which was counseling/coordinating care for L5/S1 disc aspiration

## 2022-07-07 NOTE — H&P (Addendum)
History and Physical    Patient: Eric Barrett. HFW:[id number] DOB: [birth year] DOA: 07/06/2022 DOS: the patient was seen and examined on 07/07/2022 PCP: [name], FNP  Patient coming from: Home - lives with wife; NOK: Wife, [phone]   Chief Complaint: Back pain  HPI: Eric Barrett. is a 81 y.o. male with medical history significant of chronic diastolic CHF; glaucoma; dementia; CVA with L hemiparesis; HTN; and HLD presenting with back pain, MRI showing osteo of the spine.  He was last hospitalized from 10/9-18 with acute on chronic hyponatremia and MRSE UTI.  He went to rehab and returned home on 11/27.  He was experiencing some "regression" - not doing as well as he was in rehab.  He is getting home PT.  Due to the severe pain in his back, he had trouble walking, leg and arm tremor.  He has not had chronic back pain, started before last hospitalization.  He wasn't walking well starting in about August but did not really complain.  He started complaining of pain while in rehab, in the lumbar region.  He was having such severe pain that he wasn't really able to get around at home.  He was using a walker and then a wheelchair.   He had an MRI 2 days ago at [facility].   Their PCP got the result yesterday ("infection in the back", recommended IV antibiotics, "osteomyelitis at LS-S1") and told them to take him to [place].    ER Course:  [facility] transfer, per Dr. Velia Meyer:  Osteomyelitis of the lumbar spine per MRI of the thoracic/lumbar spine performed as an outpatient at [facility] 2 days ago. History of chronic low back discomfort, with worsening in associated intensity over the preceding weeks, prompting further evaluation by the patient's PCP, including MRI of the thoracic and lumbar spine, performed on 07/04/2022.  The patient was subsequently contacted with the results of these imaging studies, and was told to go to the emergency department for further  evaluation/management of osteomyelitis of the lumbar spine that was seen on the above imaging.     No report of any red flag symptoms, including no report of any recent saddle anesthesia, or recent bowel incontinence / urinary retention.   Vital signs reported to be stable, with the patient afebrile, and appearing hemodynamically stable.   Labs were notable for mildly elevated initial lactate of 2.1.   He was started on IV vancomycin and cefepime at [facility] this evening for presumed osteomyelitis of the lumbar spine.    Review of Systems: As mentioned in the history of present illness. All other systems reviewed and are negative. Past Medical History:  Diagnosis Date   BPH (benign prostatic hyperplasia)    Chronic diastolic (congestive) heart failure (HCC)    Dementia (HCC)    Glaucoma    Hemiparesis of left nondominant side (HCC)    History of shingles    HLD (hyperlipidemia)    Hypertension    Stroke [facility])    Tremor    Past Surgical History:  Procedure Laterality Date   COLONOSCOPY WITH PROPOFOL N/A 05/24/2022   Procedure: COLONOSCOPY WITH PROPOFOL;  Surgeon: Ladene Artist, MD;  Location: [place];  Service: Gastroenterology;  Laterality: N/A;   No past surgery     POLYPECTOMY  05/24/2022   Procedure: POLYPECTOMY;  Surgeon: Ladene Artist, MD;  Location: [facility] ENDOSCOPY;  Service: Gastroenterology;;   Social History:  reports that he quit smoking about 38 years  ago. His smoking use included cigars. He has never used smokeless tobacco. He reports that he does not currently use alcohol. He reports that he does not use drugs.  No Known Allergies  Family History  Problem Relation Age of Onset   Heart attack Mother    Heart attack Father     Prior to Admission medications   Medication Sig Start Date End Date Taking? Authorizing Provider  acetaminophen (TYLENOL) 325 MG tablet Take 2 tablets (650 mg total) by mouth every 6 (six) hours as needed for mild pain, fever  or headache. 05/24/22   Elgergawy, Silver Huguenin, MD  amLODipine (NORVASC) 10 MG tablet Take 10 mg by mouth daily. 07/08/21   [provider]  ASPIRIN 81 PO Take 81 mg by mouth daily.    [provider]  Cholecalciferol (VITAMIN D3) 25 MCG (1000 UT) CAPS Take 1,000 Units by mouth daily.    [provider]  ciprofloxacin (CIPRO) 500 MG tablet Take 1 tablet (500 mg total) by mouth 2 (two) times daily. 05/24/22   Elgergawy, Silver Huguenin, MD  divalproex (DEPAKOTE ER) 500 MG 24 hr tablet Take 1 tablet (500 mg total) by mouth at bedtime. 01/19/22   [name], MD  docusate sodium (COLACE) 100 MG capsule Take 100 mg by mouth daily.    [provider]  finasteride (PROSCAR) 5 MG tablet Take 5 mg by mouth daily. 08/24/21   [provider]  GAVILAX 17 GM/SCOOP powder Take 17 g by mouth daily. 04/26/22   [provider]  memantine (NAMENDA) 10 MG tablet Take 10 mg by mouth 2 (two) times daily. 02/08/22   [provider]  MOVANTIK 25 MG TABS tablet Take 25 mg by mouth daily. 05/04/22   [provider]  multivitamin-lutein (OCUVITE-LUTEIN) CAPS capsule Take 1 capsule by mouth daily.    [provider]  MYRBETRIQ 25 MG TB24 tablet Take 25 mg by mouth daily. 11/02/21   [provider]  rosuvastatin (CRESTOR) 20 MG tablet Take 20 mg by mouth daily.    [provider]  senna (SENOKOT) 8.6 MG TABS tablet Take 1 tablet (8.6 mg total) by mouth daily. 05/24/22   Elgergawy, Silver Huguenin, MD  tamsulosin (FLOMAX) 0.4 MG CAPS capsule Take 0.4 mg by mouth daily. 10/29/20   [provider]  Travoprost, BAK Free, (TRAVATAN) 0.004 % SOLN ophthalmic solution Place 1 drop into both eyes at bedtime.    [provider]    Physical Exam: Vitals:   07/07/22 0230 07/07/22 0339 07/07/22 0743 07/07/22 1111  BP: 114/73 139/76 122/68 112/67  Pulse: 69 75 68 65  Resp: _0 Temp: 98.2 F (36.8 C) 97.9 F (36.6 C) 97.9 F (36.6  C) 98.3 F (36.8 C)  TempSrc: Oral Oral Oral Oral  SpO2: 98% 92% 96% 98%  Weight:      Height:       General:  Appears calm and comfortable and is in NAD Eyes:  EOMI, normal lids, iris ENT:  grossly normal hearing, lips & tongue, mmm Neck:  no LAD, masses or thyromegaly Cardiovascular:  RRR, no m/r/g. No LE edema.  Respiratory:   CTA bilaterally with no wheezes/rales/rhonchi.  Normal respiratory effort. Abdomen:  soft, NT, ND Back:   normal alignment, lidocaine patch in place, midline lumbar spine TTP Skin:  no rash or induration seen on limited exam Musculoskeletal:  grossly normal tone BUE/BLE, good ROM, no bony abnormality Psychiatric:  blunted mood and affect,  speech fluent and appropriate, AOx3 Neurologic:  CN 2-12 grossly intact, moves all extremities in coordinated fashion   Radiological Exams on Admission: Independently reviewed - see discussion in A/P where applicable  [place] EKG SITE RITE  Result Date: 07/07/2022 If Site Rite image not attached, placement could not be confirmed due to current cardiac rhythm.  DG Chest Port 1 View  Result Date: 07/06/2022 CLINICAL DATA:  Back pain. EXAM: PORTABLE CHEST 1 VIEW COMPARISON:  None Available. FINDINGS: The heart size and mediastinal contours are within normal limits. There is no evidence of an acute infiltrate, pleural effusion or pneumothorax. Degenerative changes are seen involving both shoulders. Multilevel degenerative changes are noted throughout the thoracic spine. IMPRESSION: No active disease. Electronically Signed   By: Virgina Norfolk M.D.   On: 07/06/2022 20:35    EKG: Independently reviewed.  NSR with rate 94; PVCs; nonspecific ST changes with no evidence of acute ischemia   Labs on Admission: I have personally reviewed the available labs and imaging studies at the time of the admission.  Pertinent labs:    Na++ 132 Glucose 115 Lactate 2.1, 1.5 WBC 6.2 Hgb 11.2 UA WNL Blood and urine cultures  pending Urine culture on 10/10 with oxacillin-resistant Staph Epi   Assessment and Plan: Principal Problem:   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HCC) Active Problems:   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HCC)   Glaucoma   Hypertension   HLD (hyperlipidemia)   Chronic diastolic (congestive) heart failure (HCC)   BPH (benign prostatic hyperplasia)    Lumbar osteo -Patient presenting with progressively worsening low back pain and MRI reported diagnostic of osteomyelitis (imaging was performed at [facility] and so far information is not transferring in [place]). -He was recently admitted and was diagnosed with a UTI from oxacillin-resistant Staph Epi. -ESR is pending, normal CRP.  WBC is normal. -MRI was reported positive; his wife brought a disc and this has been sent to radiology for a read in our system -Will need antibiotics for 4-6 weeks. -With vertebral osteo, needs testing for TB; will order quantiferon gold. -Neurosurgery consulted: There is nothing that needs to be handled acutely, Would proceed with the IR aspiration as planned. 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  -Will request IR consult to biopsy; unfortunately, he has already received 1 dose of Cefepime/Vanc. -Vertebral osteo is more common in the L-spine and usually presents with severe pain.  -Will hold further abx until post-biopsy for now. -Will place PICC line. -Will request ID consult. -Pain control with Lidoderm, Oxy, morphine as needed. -Once pain is improved, he will need PT/OT consults. -Blood cultures are also pending  Dementia -He is currently oriented x 3 but his daughter reports that he has significant fluctuations in cognition -Delirium precautions ordered -Continue Namenda, depakote  HTN -Continue amlodipine, losartan  HLD -Continue rosuvastatin  Chronic diastolic CHF -Grade 1 diastolic dysfunction in 04/9241 -Appears compensated  BPH -Continue tamsulosin,  Myrbetriq, finasteride  Glaucoma -Continue Travatan    Advance Care Planning:   Code Status: Full Code   Consults: ID; IR; Neurosurgery; chaplain (family requests for advanced directives); TOC team; will need PT/OT  DVT Prophylaxis: SCDs  Family Communication: Wife was present throughout evaluation and daughter was present for the discussion via telephone  Severity of Illness: The appropriate patient status for this patient is INPATIENT. Inpatient status is judged to be reasonable and necessary in order to provide the required intensity of service to ensure the patient's safety. The  patient's presenting symptoms, physical exam findings, and initial radiographic and laboratory data in the context of their chronic comorbidities is felt to place them at high risk for further clinical deterioration. Furthermore, it is not anticipated that the patient will be medically stable for discharge from the hospital within 2 midnights of admission.   * I certify that at the point of admission it is my clinical judgment that the patient will require inpatient hospital care spanning beyond 2 midnights from the point of admission due to high intensity of service, high risk for further deterioration and high frequency of surveillance required.*  Author: Karmen Bongo, MD 07/07/2022 3:22 PM  For on call review [email or web address].

## 2022-07-07 NOTE — Progress Notes (Signed)
Hospitalist Transfer Note:  Transferring facility: [facility] Requesting provider: Dr. Nechama Guard (EDP at [facility]) Reason for transfer: admission for further evaluation and management of suspected osteomyelitis of the lumbar spine.    81  year old male with medical history notable for chronic diastolic heart failure, dementia, who presented to [facility] ED complaining of osteomyelitis of the lumbar spine per MRI of the thoracic/lumbar spine performed as an outpatient at [facility] 2 days ago.   In the context of the patient's dementia, history is also provided by the patient's wife.  The patient provides a history of chronic low back discomfort, with worsening in associated intensity over the preceding weeks, prompting further evaluation by the patient's PCP, including MRI of the thoracic and lumbar spine, which were reportedly performed on 07/04/2022 at a [place] facility.  The patient was subsequently contacted with the results of these imaging studies, and was told to go to the emergency department for further evaluation/management of osteomyelitis of the lumbar spine that was seen on the above imaging.  Patient has with him a disc that reportedly has results of his recent MRI studies.  However, [facility] was unable to open the contents of this disc.  Additionally, staff [facility] contacted [facility] this evening, and attempts to transfer the results of these imaging studies were also unsuccessful.   No report of any red flag symptoms, including no report of any recent saddle anesthesia, or recent bowel incontinence / urinary retention.  Vital signs reported to be stable, with the patient afebrile, and appearing hemodynamically stable.  Labs were notable for mildly elevated initial lactate of 2.1.  He was started on IV vancomycin and cefepime at [facility] this evening for presumed osteomyelitis of the lumbar spine.  Of note, patient was reportedly recently hospitalized in the [facility] health system for 9 days in  mid October 2023, reportedly for urinary tract infection as well as acute hyponatremia.   Subsequently, I accepted this patient for transfer for inpatient admission to a MedSurg bed at [facility] for further work-up and management of reported osteomyelitis of the lumbar spine.       Check [email or web address] for on-call coverage.   Nursing staff, Please call [place] number on Amion as soon as patient's arrival, so appropriate admitting provider can evaluate the pt.     Babs Bertin, DO Hospitalist

## 2022-07-07 NOTE — Progress Notes (Signed)
Pt to Rm [id number] via [facility], one bag of belongs with patient in white belongings bag, VSS, focused assessment charted, pt needs addressed, bed alarm on, call light in reach, bed in lowest postion

## 2022-07-08 DIAGNOSIS — M4626 Osteomyelitis of vertebra, lumbar region: Secondary | ICD-10-CM | POA: Diagnosis not present

## 2022-07-08 LAB — URINE CULTURE: Culture: NO GROWTH

## 2022-07-08 MED ORDER — ACETAMINOPHEN 500 MG PO TABS
1000.0000 mg | ORAL_TABLET | Freq: Three times a day (TID) | ORAL | Status: DC
  Administered 2022-07-08 – 2022-07-26 (×54): 1000 mg via ORAL
  Filled 2022-07-08 (×54): qty 2

## 2022-07-08 NOTE — Progress Notes (Signed)
ID brief note   11/30 blood cx NG in 2 days  12/1 blood cx NG in 1 day  Afebrile, no leukocytosis  No signs of sepsis  IR plans for L5/s1 disc aspiration on 07/10/22  Rosiland Oz, MD Infectious Disease Physician [facility] [address] [place], [place] [address] Phone: [phone]  Fax: [phone]

## 2022-07-08 NOTE — Progress Notes (Signed)
PROGRESS NOTE    Eric Barrett.  GUY:[id number] DOB: 02/13/1941 DOA: 07/06/2022 PCP: [name], FNP    Brief Narrative:  Eric Barrett. is a 81 y.o. male with medical history significant of chronic diastolic CHF; glaucoma; dementia; CVA with L hemiparesis; HTN; and HLD presenting with back pain, MRI showing osteo of the spine.  He was last hospitalized from 10/9-18 with acute on chronic hyponatremia and MRSE UTI.  He went to rehab and returned home on 11/27.  He was experiencing some "regression" - not doing as well as he was in rehab.  He is getting home PT.  Due to the severe pain in his back, he had trouble walking, leg and arm tremor.  He has not had chronic back pain, started before last hospitalization.  He wasn't walking well starting in about August but did not really complain.  He started complaining of pain while in rehab, in the lumbar region.  He was having such severe pain that he wasn't really able to get around at home.  He was using a walker and then a wheelchair.   He had an MRI 2 days ago at [facility].   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    Assessment and Plan: Lumbar osteo -Patient presenting with progressively worsening low back pain and MRI reported diagnostic of osteomyelitis (imaging was performed at [facility] and so far information is not transferring in [place]). -He was recently admitted and was diagnosed with a UTI from oxacillin-resistant Staph Epi. -MRI was reported positive; his wife brought a disc and this has been sent to radiology for a read in our system -may need antibiotics for 4-6 weeks. -Neurosurgery consulted: There is nothing that needs to be handled acutely, Would proceed with the IR aspiration as planned. 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  -Will request IR consult  to biopsy; unfortunately, he has already received 1 dose of Cefepime/Vanc.- plan for biopsy 12/4 -Will hold further abx until post-biopsy for now. - ID consult. -Pain control with Lidoderm, Oxy, morphine as needed. -Once pain is improved, he will need PT/OT consults. -Blood cultures are also pending -CRP and sed normal -schedule tylenol   Dementia -daughter reports that he has significant fluctuations in cognition -Delirium precautions ordered -Continue Namenda, depakote   HTN -Continue amlodipine, losartan   HLD -Continue rosuvastatin   Chronic diastolic CHF -Grade 1 diastolic dysfunction in 12/6385 -Appears compensated   BPH -Continue tamsulosin, Myrbetriq, finasteride   Glaucoma -Continue Travatan    DVT prophylaxis: SCDs Start: 07/07/22 1105    Code Status: Full Code   Disposition Plan:  Level of care: Med-Surg Status is: Inpatient Remains inpatient appropriate because: c/o back pain    Consultants:  Neurosurgery (phone) ID IR   Subjective: C/o back pain  Objective: Vitals:   07/07/22 1929 07/07/22 2312 07/08/22 0308 07/08/22 0725  BP: 115/66 123/65 116/71 113/69  Pulse: 81 66 65 68  Resp: '18 15 18 18  '$ Temp: 98.5 F (36.9 C) 98.4 F (36.9 C) 98.4 F (36.9 C) 98.5 F (36.9 C)  TempSrc: Oral Oral Oral Oral  SpO2: 98% 95% 95% 97%  Weight:      Height:        Intake/Output Summary (Last 24 hours) at 07/08/2022 1229 Last data filed at 07/08/2022 0851 Gross per 24 hour  Intake 460 ml  Output 975 ml  Net -515 ml   Filed Weights   07/06/22 1958  Weight: 82.6 kg    Examination:   General: Appearance:     Overweight male in no acute distress     Lungs:     respirations unlabored  Heart:    Normal heart rate.   MS:   All extremities are intact.    Neurologic:   Awake, alert       Data Reviewed: I have personally reviewed following labs and imaging studies  CBC: Recent Labs  Lab 07/06/22 2010 07/07/22 0438  WBC 6.8 6.2   NEUTROABS 4.6  --   HGB 13.4 11.2*  HCT 40.1 32.7*  MCV 81.7 81.5  PLT 359 865   Basic Metabolic Panel: Recent Labs  Lab 07/06/22 2010 07/07/22 0438  NA 132* 132*  K 3.9 4.1  CL 93* 99  CO2 29 27  GLUCOSE 119* 115*  BUN 11 8  CREATININE 1.29* 1.12  CALCIUM 9.8 9.0   GFR: Estimated Creatinine Clearance: 53.2 mL/min (by C-G formula based on SCr of 1.12 mg/dL). Liver Function Tests: Recent Labs  Lab 07/06/22 2010  AST 19  ALT 11  ALKPHOS 68  BILITOT 0.4  PROT 8.5*  ALBUMIN 4.2   No results for input(s): "LIPASE", "AMYLASE" in the last 168 hours. No results for input(s): "AMMONIA" in the last 168 hours. Coagulation Profile: Recent Labs  Lab 07/06/22 2010  INR 1.1   Cardiac Enzymes: No results for input(s): "CKTOTAL", "CKMB", "CKMBINDEX", "TROPONINI" in the last 168 hours. BNP (last 3 results) No results for input(s): "PROBNP" in the last 8760 hours. HbA1C: No results for input(s): "HGBA1C" in the last 72 hours. CBG: No results for input(s): "GLUCAP" in the last 168 hours. Lipid Profile: No results for input(s): "CHOL", "HDL", "LDLCALC", "TRIG", "CHOLHDL", "LDLDIRECT" in the last 72 hours. Thyroid Function Tests: No results for input(s): "TSH", "T4TOTAL", "FREET4", "T3FREE", "THYROIDAB" in the last 72 hours. Anemia Panel: No results for input(s): "VITAMINB12", "FOLATE", "FERRITIN", "TIBC", "IRON", "RETICCTPCT" in the last 72 hours. Sepsis Labs: Recent Labs  Lab 07/06/22 2010 07/06/22 2209  LATICACIDVEN 2.1* 1.5    Recent Results (from the past 240 hour(s))  Blood Culture (routine x 2)     Status: None (Preliminary result)   Collection Time: 07/06/22  8:10 PM   Specimen: BLOOD  Result Value Ref Range Status   Specimen Description   Final    BLOOD LEFT ANTECUBITAL Performed at [facility], [address], [place], [place] [address]    Special Requests   Final    Blood Culture adequate volume BOTTLES DRAWN AEROBIC AND  ANAEROBIC Performed at [facility], [address] [place], [place] [address]    Culture   Final    NO GROWTH 2 DAYS Performed at [facility], [place] [address], [place], [place] [address]    Report Status PENDING  Incomplete  Urine Culture     Status: None   Collection Time: 07/06/22  9:56 PM   Specimen: In/Out Cath Urine  Result Value Ref Range Status   Specimen Description   Final    IN/OUT CATH URINE Performed at [facility], [address], [place], [place] [address]    Special Requests   Final    NONE Performed at [facility], [address] [place], [place] [address]    Culture   Final    NO GROWTH Performed at [facility], [place] [address] [place], [place] [address]    Report Status 07/08/2022 FINAL  Final  Blood Culture (routine x 2)     Status: None (Preliminary result)   Collection Time: 07/07/22  4:38 AM   Specimen: BLOOD  Result Value Ref Range Status   Specimen Description BLOOD RIGHT ANTECUBITAL  Final   Special Requests   Final    BOTTLES DRAWN AEROBIC AND ANAEROBIC Blood Culture adequate volume   Culture   Final    NO GROWTH 1 DAY Performed at [facility], [place] [address], [place], [place] [address]    Report Status PENDING  Incomplete         Radiology Studies: [place] EKG SITE RITE  Result Date: 07/07/2022 If Site Rite image not attached, placement could not be confirmed due to current cardiac rhythm.  DG Chest Port 1 View  Result Date: 07/06/2022 CLINICAL DATA:  Back pain. EXAM: PORTABLE CHEST 1 VIEW COMPARISON:  None Available. FINDINGS: The heart size and mediastinal contours are within normal limits. There is no evidence of an acute infiltrate, pleural effusion or pneumothorax. Degenerative changes are seen involving both shoulders. Multilevel degenerative changes are noted throughout the thoracic spine. IMPRESSION: No active disease. Electronically Signed   By:  Virgina Norfolk M.D.   On: 07/06/2022 20:35        Scheduled Meds:  amLODipine  10 mg Oral Daily   aspirin  81 mg Oral Daily   divalproex  500 mg Oral QHS   finasteride  5 mg Oral Daily   latanoprost  1 drop Both Eyes QHS   lidocaine  1 patch Transdermal Q24H   losartan  50 mg Oral Daily   memantine  10 mg Oral BID   mirabegron ER  25 mg Oral Daily   rosuvastatin  20 mg Oral Daily   senna-docusate  1 tablet Oral BID   tamsulosin  0.4 mg Oral Daily   Continuous Infusions:   LOS: 1 day    Time spent: 45 minutes spent on chart review, discussion with nursing staff, consultants, updating family and interview/physical exam; more than 50% of that time was spent in counseling and/or coordination of care.    Geradine Girt, DO [facility] Available via Epic secure chat 7am-7pm After these hours, please refer to coverage provider listed on [email or web address] 07/08/2022, 12:29 PM

## 2022-07-09 DIAGNOSIS — M4626 Osteomyelitis of vertebra, lumbar region: Secondary | ICD-10-CM | POA: Diagnosis not present

## 2022-07-09 LAB — CBC
HCT: 33.3 % — ABNORMAL LOW (ref 39.0–52.0)
Hemoglobin: 11.1 g/dL — ABNORMAL LOW (ref 13.0–17.0)
MCH: 27 pg (ref 26.0–34.0)
MCHC: 33.3 g/dL (ref 30.0–36.0)
MCV: 81 fL (ref 80.0–100.0)
Platelets: 342 10*3/uL (ref 150–400)
RBC: 4.11 MIL/uL — ABNORMAL LOW (ref 4.22–5.81)
RDW: 14.4 % (ref 11.5–15.5)
WBC: 7.2 10*3/uL (ref 4.0–10.5)
nRBC: 0 % (ref 0.0–0.2)

## 2022-07-09 LAB — BASIC METABOLIC PANEL
Anion gap: 10 (ref 5–15)
BUN: 14 mg/dL (ref 8–23)
CO2: 30 mmol/L (ref 22–32)
Calcium: 9 mg/dL (ref 8.9–10.3)
Chloride: 90 mmol/L — ABNORMAL LOW (ref 98–111)
Creatinine, Ser: 1.25 mg/dL — ABNORMAL HIGH (ref 0.61–1.24)
GFR, Estimated: 58 mL/min — ABNORMAL LOW (ref >60)
Glucose, Bld: 107 mg/dL — ABNORMAL HIGH (ref 70–99)
Potassium: 4.5 mmol/L (ref 3.5–5.1)
Sodium: 130 mmol/L — ABNORMAL LOW (ref 135–145)

## 2022-07-09 MED ORDER — SODIUM CHLORIDE 0.9 % IV SOLN: INTRAVENOUS | Status: DC

## 2022-07-09 NOTE — Progress Notes (Signed)
PROGRESS NOTE    Eric Barrett.  HDQ:[id number] DOB: 11-16-1940 DOA: 07/06/2022 PCP: [name], FNP    Brief Narrative:  Eric Haugen. is a 81 y.o. male with medical history significant of chronic diastolic CHF; glaucoma; dementia; CVA with L hemiparesis; HTN; and HLD presenting with back pain, MRI showing osteo of the spine.  He was last hospitalized from 10/9-18 with acute on chronic hyponatremia and MRSE UTI.  He went to rehab and returned home on 11/27.  He was experiencing some "regression" - not doing as well as he was in rehab.  He is getting home PT.  Due to the severe pain in his back, he had trouble walking, leg and arm tremor.  He has not had chronic back pain, started before last hospitalization.  He wasn't walking well starting in about August but did not really complain.  He started complaining of pain while in rehab, in the lumbar region.  He was having such severe pain that he wasn't really able to get around at home.  He was using a walker and then a wheelchair.   He had an MRI 2 days ago at [facility].   Their PCP got the result yesterday ("infection in the back", recommended IV antibiotics, "osteomyelitis at LS-S1") and told them to take him to [place].    Assessment and Plan: Lumbar osteo -Patient presenting with progressively worsening low back pain and MRI reported diagnostic of osteomyelitis (imaging was performed at [facility] and so far information is not transferring in [place]). -He was recently admitted and was diagnosed with a UTI from oxacillin-resistant Staph Epi. -MRI was reported positive; his wife brought a disc and this has been sent to radiology for a read in our system -may need antibiotics for 4-6 weeks but interestingly his CRP and sed rate are normal -Neurosurgery consulted: There is nothing that needs to be handled acutely, Would proceed with the IR aspiration as planned. 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  -Will request IR consult to biopsy; unfortunately, he has already received 1 dose of Cefepime/Vanc.- plan for biopsy 12/4 -Will hold further abx until post-biopsy for now. - ID consult. -Pain control with Lidoderm, Oxy, morphine as needed. -PT/OT consults. -Blood cultures NGTD -schedule tylenol   Hyponatremia -mild -trend  Dementia -daughter reports that he has significant fluctuations in cognition -Delirium precautions ordered -Continue Namenda, depakote   HTN -Continue amlodipine, losartan   HLD -Continue rosuvastatin   Chronic diastolic CHF -Grade 1 diastolic dysfunction in 08/1939 -Appears compensated   BPH -Continue tamsulosin, Myrbetriq, finasteride   Glaucoma -Continue Travatan    DVT prophylaxis: SCDs Start: 07/07/22 1105    Code Status: Full Code   Disposition Plan:  Level of care: Med-Surg Status is: Inpatient Remains inpatient appropriate because: c/o back pain    Consultants:  Neurosurgery (phone) ID IR   Subjective: Pain is improved- - at a level 4  Objective: Vitals:   07/08/22 1928 07/08/22 2312 07/09/22 0310 07/09/22 0725  BP: 115/74 111/74 120/71 119/71  Pulse: 82 63 74 64  Resp: '19 17 16 16  '$ Temp: 97.6 F (36.4 C) 98.1 F (36.7 C)  98.5 F (36.9 C)  TempSrc: Oral Oral Oral Oral  SpO2: 95% 97% 98% 95%  Weight:      Height:        Intake/Output Summary (Last 24 hours) at 07/09/2022 1218 Last data filed at 07/09/2022 0736 Gross per  24 hour  Intake 240 ml  Output 1150 ml  Net -910 ml   Filed Weights   07/06/22 1958  Weight: 82.6 kg    Examination:    General: Appearance:     Overweight male in no acute distress     Lungs:     respirations unlabored  Heart:    Normal heart rate.  MS:   All extremities are intact.   Neurologic:   Awake, alert         Data Reviewed: I have personally reviewed following labs and imaging studies  CBC: Recent Labs  Lab 07/06/22 2010 07/07/22 0438  07/09/22 0311  WBC 6.8 6.2 7.2  NEUTROABS 4.6  --   --   HGB 13.4 11.2* 11.1*  HCT 40.1 32.7* 33.3*  MCV 81.7 81.5 81.0  PLT 359 301 761   Basic Metabolic Panel: Recent Labs  Lab 07/06/22 2010 07/07/22 0438 07/09/22 0311  NA 132* 132* 130*  K 3.9 4.1 4.5  CL 93* 99 90*  CO2 '29 27 30  '$ GLUCOSE 119* 115* 107*  BUN '11 8 14  '$ CREATININE 1.29* 1.12 1.25*  CALCIUM 9.8 9.0 9.0   GFR: Estimated Creatinine Clearance: 47.7 mL/min (A) (by C-G formula based on SCr of 1.25 mg/dL (H)). Liver Function Tests: Recent Labs  Lab 07/06/22 2010  AST 19  ALT 11  ALKPHOS 68  BILITOT 0.4  PROT 8.5*  ALBUMIN 4.2   No results for input(s): "LIPASE", "AMYLASE" in the last 168 hours. No results for input(s): "AMMONIA" in the last 168 hours. Coagulation Profile: Recent Labs  Lab 07/06/22 2010  INR 1.1   Cardiac Enzymes: No results for input(s): "CKTOTAL", "CKMB", "CKMBINDEX", "TROPONINI" in the last 168 hours. BNP (last 3 results) No results for input(s): "PROBNP" in the last 8760 hours. HbA1C: No results for input(s): "HGBA1C" in the last 72 hours. CBG: No results for input(s): "GLUCAP" in the last 168 hours. Lipid Profile: No results for input(s): "CHOL", "HDL", "LDLCALC", "TRIG", "CHOLHDL", "LDLDIRECT" in the last 72 hours. Thyroid Function Tests: No results for input(s): "TSH", "T4TOTAL", "FREET4", "T3FREE", "THYROIDAB" in the last 72 hours. Anemia Panel: No results for input(s): "VITAMINB12", "FOLATE", "FERRITIN", "TIBC", "IRON", "RETICCTPCT" in the last 72 hours. Sepsis Labs: Recent Labs  Lab 07/06/22 2010 07/06/22 2209  LATICACIDVEN 2.1* 1.5    Recent Results (from the past 240 hour(s))  Blood Culture (routine x 2)     Status: None (Preliminary result)   Collection Time: 07/06/22  8:10 PM   Specimen: BLOOD  Result Value Ref Range Status   Specimen Description   Final    BLOOD LEFT ANTECUBITAL Performed at [facility], [address]  [place], [place] [address]    Special Requests   Final    Blood Culture adequate volume BOTTLES DRAWN AEROBIC AND ANAEROBIC Performed at [facility], [address] [place], [place] [address]    Culture   Final    NO GROWTH 3 DAYS Performed at [facility], [place] [address], [place], [place] [address]    Report Status PENDING  Incomplete  Urine Culture     Status: None   Collection Time: 07/06/22  9:56 PM   Specimen: In/Out Cath Urine  Result Value Ref Range Status   Specimen Description   Final    IN/OUT CATH URINE Performed at [facility], [address] [place], [place] [address]    Special Requests   Final    NONE Performed  at [facility], [address], [place], [place] [address]    Culture   Final    NO GROWTH Performed at [facility], [place] [address] [place], [place] [address]    Report Status 07/08/2022 FINAL  Final  Blood Culture (routine x 2)     Status: None (Preliminary result)   Collection Time: 07/07/22  4:38 AM   Specimen: BLOOD  Result Value Ref Range Status   Specimen Description BLOOD RIGHT ANTECUBITAL  Final   Special Requests   Final    BOTTLES DRAWN AEROBIC AND ANAEROBIC Blood Culture adequate volume   Culture   Final    NO GROWTH 2 DAYS Performed at [facility], [place] [address] [place], [place] [address]    Report Status PENDING  Incomplete         Radiology Studies: No results found.      Scheduled Meds:  acetaminophen  1,000 mg Oral TID   amLODipine  10 mg Oral Daily   aspirin  81 mg Oral Daily   divalproex  500 mg Oral QHS   finasteride  5 mg Oral Daily   latanoprost  1 drop Both Eyes QHS   lidocaine  1 patch Transdermal Q24H   losartan  50 mg Oral Daily   memantine  10 mg Oral BID   mirabegron ER  25 mg Oral Daily   rosuvastatin  20 mg Oral Daily   senna-docusate  1 tablet Oral BID   tamsulosin  0.4 mg Oral Daily   Continuous Infusions:   LOS: 2  days    Time spent: 45 minutes spent on chart review, discussion with nursing staff, consultants, updating family and interview/physical exam; more than 50% of that time was spent in counseling and/or coordination of care.    Geradine Girt, DO [facility] Available via Epic secure chat 7am-7pm After these hours, please refer to coverage provider listed on [email or web address] 07/09/2022, 12:18 PM

## 2022-07-09 NOTE — Evaluation (Signed)
Occupational Therapy Evaluation Patient Details Name: Eric Barrett. MRN: [id number] DOB: [birth year] Today's Date: 07/09/2022   History of Present Illness Eric Barrett. is a 81 y.o. male  presenting with back pain, MRI showing osteo of the spine.  He was last hospitalized from 10/9-18 with acute on chronic hyponatremia and MRSE UTI.  He went to rehab and returned home on 11/27.   Pt with medical history significant of chronic diastolic CHF; glaucoma; dementia; CVA with L hemiparesis; HTN; and HLD   Clinical Impression   Eric Barrett was evaluated s/p the above admission list, he ambulates with a RW and requires assist for ADLs from his wife since recent d/c home from rehab. Upon evaluation pt had functional limitations due to back pain, tremor with pain and intentional movement, generalized weakness, decreased balance and poor activity tolerance. Overall he required mod A for bed mobility and to transfer at EOB, and min G to side step with RW. Due to deficits listed below, he requires up to max A for ADLs. OT to continue to follow acutely. Recommend d/c to SNF for continued therapy     Recommendations for follow up therapy are one component of a multi-disciplinary discharge planning process, led by the attending physician.  Recommendations may be updated based on patient status, additional functional criteria and insurance authorization.   Follow Up Recommendations  Skilled nursing-short term rehab (<3 hours/day)     Assistance Recommended at Discharge Frequent or constant Supervision/Assistance  Patient can return home with the following A lot of help with walking and/or transfers;A lot of help with bathing/dressing/bathroom;Assistance with cooking/housework;Assist for transportation;Help with stairs or ramp for entrance    Functional Status Assessment  Patient has had a recent decline in their functional status and demonstrates the ability to make significant improvements in function  in a reasonable and predictable amount of time.  Equipment Recommendations  None recommended by OT       Precautions / Restrictions Precautions Precautions: Fall Restrictions Weight Bearing Restrictions: No      Mobility Bed Mobility Overal bed mobility: Needs Assistance Bed Mobility: Sidelying to Sit, Rolling, Sit to Sidelying Rolling: Min assist Sidelying to sit: Mod assist     Sit to sidelying: Min assist      Transfers Overall transfer level: Needs assistance Equipment used: Rolling walker (2 wheels) Transfers: Sit to/from Stand Sit to Stand: Mod assist           General transfer comment: min G for side stepping at the bed side      Balance Overall balance assessment: Needs assistance Sitting-balance support: Feet supported Sitting balance-Leahy Scale: Fair     Standing balance support: Bilateral upper extremity supported, During functional activity Standing balance-Leahy Scale: Poor                             ADL either performed or assessed with clinical judgement   ADL Overall ADL's : Needs assistance/impaired Eating/Feeding: Independent;Sitting   Grooming: Set up;Sitting   Upper Body Bathing: Set up;Sitting   Lower Body Bathing: Maximal assistance;Sit to/from stand   Upper Body Dressing : Set up;Sitting   Lower Body Dressing: Maximal assistance;Sit to/from stand   Toilet Transfer: Moderate assistance;Stand-pivot;Rolling walker (2 wheels);BSC/3in1   Toileting- Clothing Manipulation and Hygiene: Maximal assistance;Sit to/from stand       Functional mobility during ADLs: Moderate assistance;Rolling walker (2 wheels) General ADL Comments: limited by pain and tremor  Vision Baseline Vision/History: 0 No visual deficits Vision Assessment?: No apparent visual deficits     Perception Perception Perception Tested?: No   Praxis Praxis Praxis tested?: Not tested    Pertinent Vitals/Pain Pain Assessment Pain Assessment:  0-10 Pain Score: 10-Worst pain ever Pain Location: back with mobility Pain Descriptors / Indicators: Discomfort Pain Intervention(s): Limited activity within patient's tolerance, Monitored during session, Heat applied     Hand Dominance Right   Extremity/Trunk Assessment Upper Extremity Assessment Upper Extremity Assessment: LUE deficits/detail LUE Deficits / Details: weeaker than R but WFL for functional tasks LUE Sensation: WNL LUE Coordination: decreased fine motor   Lower Extremity Assessment Lower Extremity Assessment: Defer to PT evaluation   Cervical / Trunk Assessment Cervical / Trunk Assessment: Kyphotic (mild)   Communication Communication Communication: No difficulties   Cognition Arousal/Alertness: Awake/alert Behavior During Therapy: WFL for tasks assessed/performed Overall Cognitive Status: Within Functional Limits for tasks assessed                                 General Comments: pt looking to wife for majority of PLOF and home set up questions     General Comments  HR to 100s with standing     Home Living Family/patient expects to be discharged to:: Private residence Living Arrangements: Spouse/significant other Available Help at Discharge: Family;Available 24 hours/day Type of Home: Apartment Home Access: Stairs to enter Entrance Stairs-Number of Steps: 2   Home Layout: One level     Bathroom Shower/Tub: Teacher, early years/pre: Standard     Home Equipment: Conservation officer, nature (2 wheels);BSC/3in1;Tub bench;Wheelchair - manual;Grab bars - tub/shower;Hand held shower head          Prior Functioning/Environment Prior Level of Function : Needs assist             Mobility Comments: RW for all mobility, WC for longer distances or when pt feeling weak ADLs Comments: wife assistign wtih all ADLs as needed. Sponge bathing due to fear of falling        OT Problem List: Decreased strength;Decreased range of  motion;Decreased activity tolerance;Impaired balance (sitting and/or standing);Decreased safety awareness;Decreased knowledge of use of DME or AE;Decreased knowledge of precautions;Pain;Impaired UE functional use      OT Treatment/Interventions: Self-care/ADL training;Therapeutic exercise;DME and/or AE instruction;Therapeutic activities;Patient/family education;Balance training    OT Goals(Current goals can be found in the care plan section) Acute Rehab OT Goals Patient Stated Goal: less pain OT Goal Formulation: With patient Time For Goal Achievement: 07/23/22 Potential to Achieve Goals: Good ADL Goals Pt Will Perform Grooming: with supervision;standing Pt Will Perform Lower Body Dressing: with min assist;sit to/from stand Pt Will Transfer to Toilet: with min guard assist;ambulating Additional ADL Goal #1: Pt will indep complete bed mobility as a precursor to ADLs  OT Frequency: Min 2X/week       AM-PAC OT "6 Clicks" Daily Activity     Outcome Measure Help from another person eating meals?: None Help from another person taking care of personal grooming?: A Little Help from another person toileting, which includes using toliet, bedpan, or urinal?: A Lot Help from another person bathing (including washing, rinsing, drying)?: A Lot Help from another person to put on and taking off regular upper body clothing?: A Little Help from another person to put on and taking off regular lower body clothing?: A Lot 6 Click Score: 16   End of Session Equipment Utilized During  Treatment: Rolling walker (2 wheels) Nurse Communication: Mobility status  Activity Tolerance: Patient tolerated treatment well Patient left: in bed;with call bell/phone within reach;with bed alarm set;with family/visitor present  OT Visit Diagnosis: Unsteadiness on feet (R26.81);Muscle weakness (generalized) (M62.81);Other abnormalities of gait and mobility (R26.89)                Time: 0254-2706 OT Time Calculation  (min): 19 min Charges:  OT General Charges $OT Visit: 1 Visit OT Evaluation $OT Eval Moderate Complexity: 1 Mod   Uzoma Vivona D Causey 07/09/2022, 4:43 PM

## 2022-07-10 ENCOUNTER — Inpatient Hospital Stay (HOSPITAL_COMMUNITY): Payer: Medicare HMO

## 2022-07-10 DIAGNOSIS — G301 Alzheimer's disease with late onset: Secondary | ICD-10-CM

## 2022-07-10 DIAGNOSIS — M4626 Osteomyelitis of vertebra, lumbar region: Secondary | ICD-10-CM | POA: Diagnosis not present

## 2022-07-10 DIAGNOSIS — F02A0 Dementia in other diseases classified elsewhere, mild, without behavioral disturbance, psychotic disturbance, mood disturbance, and anxiety: Secondary | ICD-10-CM | POA: Diagnosis not present

## 2022-07-10 HISTORY — PX: IR LUMBAR DISC ASPIRATION W/IMG GUIDE: IMG5306

## 2022-07-10 LAB — CBC
HCT: 31.1 % — ABNORMAL LOW (ref 39.0–52.0)
Hemoglobin: 10.9 g/dL — ABNORMAL LOW (ref 13.0–17.0)
MCH: 27.7 pg (ref 26.0–34.0)
MCHC: 35 g/dL (ref 30.0–36.0)
MCV: 79.1 fL — ABNORMAL LOW (ref 80.0–100.0)
Platelets: 284 10*3/uL (ref 150–400)
RBC: 3.93 MIL/uL — ABNORMAL LOW (ref 4.22–5.81)
RDW: 13.9 % (ref 11.5–15.5)
WBC: 6.3 10*3/uL (ref 4.0–10.5)
nRBC: 0 % (ref 0.0–0.2)

## 2022-07-10 LAB — BASIC METABOLIC PANEL
Anion gap: 9 (ref 5–15)
BUN: 14 mg/dL (ref 8–23)
CO2: 26 mmol/L (ref 22–32)
Calcium: 8.5 mg/dL — ABNORMAL LOW (ref 8.9–10.3)
Chloride: 92 mmol/L — ABNORMAL LOW (ref 98–111)
Creatinine, Ser: 1.14 mg/dL (ref 0.61–1.24)
GFR, Estimated: >60 mL/min (ref >60)
Glucose, Bld: 110 mg/dL — ABNORMAL HIGH (ref 70–99)
Potassium: 4.7 mmol/L (ref 3.5–5.1)
Sodium: 127 mmol/L — ABNORMAL LOW (ref 135–145)

## 2022-07-10 LAB — SODIUM, URINE, RANDOM: Sodium, Ur: 101 mmol/L

## 2022-07-10 LAB — CSF CELL COUNT WITH DIFFERENTIAL
Lymphs, CSF: 5 % — ABNORMAL LOW (ref 40–80)
Segmented Neutrophils-CSF: 95 % — ABNORMAL HIGH (ref 0–6)

## 2022-07-10 LAB — MRSA NEXT GEN BY PCR, NASAL: MRSA by PCR Next Gen: NOT DETECTED

## 2022-07-10 LAB — OSMOLALITY, URINE: Osmolality, Ur: 344 mOsm/kg (ref 300–900)

## 2022-07-10 MED ORDER — SODIUM CHLORIDE 0.9 % IV SOLN: INTRAVENOUS | Status: DC

## 2022-07-10 MED ORDER — SODIUM CHLORIDE 0.9 % IV SOLN
2.0000 g | INTRAVENOUS | Status: DC
  Administered 2022-07-10 – 2022-07-13 (×4): 2 g via INTRAVENOUS
  Filled 2022-07-10 (×4): qty 20

## 2022-07-10 MED ORDER — MIDAZOLAM HCL 2 MG/2ML IJ SOLN
INTRAMUSCULAR | Status: AC
  Filled 2022-07-10: qty 2

## 2022-07-10 MED ORDER — SODIUM CHLORIDE 0.9 % IV SOLN
8.0000 mg/kg | Freq: Every day | INTRAVENOUS | Status: DC
  Administered 2022-07-10 – 2022-07-25 (×16): 650 mg via INTRAVENOUS
  Filled 2022-07-10 (×18): qty 13

## 2022-07-10 MED ORDER — BUPIVACAINE HCL (PF) 0.5 % IJ SOLN
INTRAMUSCULAR | Status: AC
  Filled 2022-07-10: qty 30

## 2022-07-10 MED ORDER — MIDAZOLAM HCL 2 MG/2ML IJ SOLN
INTRAMUSCULAR | Status: AC | PRN
  Administered 2022-07-10: 1 mg via INTRAVENOUS

## 2022-07-10 MED ORDER — FENTANYL CITRATE (PF) 100 MCG/2ML IJ SOLN
INTRAMUSCULAR | Status: AC | PRN
  Administered 2022-07-10: 25 ug via INTRAVENOUS

## 2022-07-10 MED ORDER — POLYETHYLENE GLYCOL 3350 17 G PO PACK
17.0000 g | PACK | Freq: Two times a day (BID) | ORAL | Status: DC
  Administered 2022-07-10 – 2022-07-26 (×28): 17 g via ORAL
  Filled 2022-07-10 (×29): qty 1

## 2022-07-10 MED ORDER — FENTANYL CITRATE (PF) 100 MCG/2ML IJ SOLN
INTRAMUSCULAR | Status: AC
  Filled 2022-07-10: qty 2

## 2022-07-10 NOTE — Care Management Important Message (Signed)
Important Message  Patient Details  Name: Eric Barrett. MRN: [id number] Date of Birth: 1940-09-08   Medicare Important Message Given:  Yes     Hannah Beat 07/10/2022, 12:29 PM

## 2022-07-10 NOTE — Progress Notes (Addendum)
Pharmacy Antibiotic Note  Eric Barrett. is a 81 y.o. male admitted on 07/06/2022 with imaging concerning for thoracolumbar discitis/osteo now s/p IR aspirate 12/4. Pharmacy has been consulted for Daptomycin + Rocephin dosing.  Plan: - Start Ceftriaxone 2g IV every 24 hours - Start Daptomycin 650 mg IV every 24 hours - CK check every Tuesday - Will continue to follow renal function, culture results, LOT, and antibiotic de-escalation plans   Height: '5\' 7"'$  (170.2 cm) Weight: 82.6 kg (182 lb) IBW/kg (Calculated) : 66.1  Temp (24hrs), Avg:98.3 F (36.8 C), Min:97.6 F (36.4 C), Max:99 F (37.2 C)  Recent Labs  Lab 07/06/22 2010 07/06/22 2209 07/07/22 0438 07/09/22 0311 07/10/22 0435  WBC 6.8  --  6.2 7.2 6.3  CREATININE 1.29*  --  1.12 1.25* 1.14  LATICACIDVEN 2.1* 1.5  --   --   --     Estimated Creatinine Clearance: 52.3 mL/min (by C-G formula based on SCr of 1.14 mg/dL).    No Known Allergies  Antimicrobials this admission: Cefepime 11/30 >> 12/1 Vancomycin 11/30 >> 12/1  Dose adjustments this admission:  Microbiology results: 11/30 BCx >> ng x4d 12/4 MRSA PCR >> neg 12/4 IR disc asp >>  Thank you for allowing pharmacy to be a part of this patient's care.  Alycia Rossetti, PharmD, BCPS Infectious Diseases Clinical Pharmacist 07/10/2022 4:53 PM   **Pharmacist phone directory can now be found on [email or web address] ([email or web address]).  Listed under [place].

## 2022-07-10 NOTE — Progress Notes (Addendum)
Subjective: No new complaints   Antibiotics:  Anti-infectives (From admission, onward)    Start     Dose/Rate Route Frequency Ordered Stop   07/07/22 2300  vancomycin (VANCOREADY) IVPB 1250 mg/250 mL  Status:  Discontinued        1,250 mg 166.7 mL/hr over 90 Minutes Intravenous Every 24 hours 07/06/22 2312 07/07/22 1150   07/07/22 0900  ceFEPIme (MAXIPIME) 2 g in sodium chloride 0.9 % 100 mL IVPB  Status:  Discontinued        2 g 200 mL/hr over 30 Minutes Intravenous Every 12 hours 07/06/22 2330 07/07/22 1150   07/06/22 2315  vancomycin (VANCOCIN) IVPB 1000 mg/200 mL premix  Status:  Discontinued        1,000 mg 200 mL/hr over 60 Minutes Intravenous  Once 07/06/22 2301 07/06/22 2303   07/06/22 2315  vancomycin (VANCOCIN) IVPB 1000 mg/200 mL premix        1,000 mg 200 mL/hr over 60 Minutes Intravenous Every hour 07/06/22 2303 07/07/22 0132   07/06/22 2100  ceFEPIme (MAXIPIME) 2 g in sodium chloride 0.9 % 100 mL IVPB        2 g 200 mL/hr over 30 Minutes Intravenous  Once 07/06/22 2059 07/06/22 2153       Medications: Scheduled Meds:  acetaminophen  1,000 mg Oral TID   amLODipine  10 mg Oral Daily   aspirin  81 mg Oral Daily   divalproex  500 mg Oral QHS   finasteride  5 mg Oral Daily   latanoprost  1 drop Both Eyes QHS   lidocaine  1 patch Transdermal Q24H   losartan  50 mg Oral Daily   memantine  10 mg Oral BID   mirabegron ER  25 mg Oral Daily   polyethylene glycol  17 g Oral BID   rosuvastatin  20 mg Oral Daily   senna-docusate  1 tablet Oral BID   tamsulosin  0.4 mg Oral Daily   Continuous Infusions:  sodium chloride 50 mL/hr at 07/10/22 0735   PRN Meds:.bisacodyl, hydrALAZINE, morphine injection, ondansetron **OR** ondansetron (ZOFRAN) IV, mouth rinse, oxyCODONE, traMADol    Objective: Weight change:   Intake/Output Summary (Last 24 hours) at 07/10/2022 1312 Last data filed at 07/10/2022 1125 Gross per 24 hour  Intake 1721.46 ml  Output 2595 ml   Net -873.54 ml   Blood pressure 118/86, pulse 75, temperature 98.2 F (36.8 C), temperature source Oral, resp. rate 15, height '5\' 7"'$  (1.702 m), weight 82.6 kg, SpO2 95 %. Temp:  [97.6 F (36.4 C)-99 F (37.2 C)] 98.2 F (36.8 C) (12/04 1137) Pulse Rate:  [70-100] 75 (12/04 1200) Resp:  [12-20] 15 (12/04 1200) BP: (103-138)/(45-87) 118/86 (12/04 1137) SpO2:  [94 %-97 %] 95 % (12/04 1200)  Physical Exam: Physical Exam Constitutional:      Appearance: He is well-developed.  HENT:     Head: Normocephalic and atraumatic.  Eyes:     Conjunctiva/sclera: Conjunctivae normal.  Cardiovascular:     Rate and Rhythm: Normal rate and regular rhythm.  Pulmonary:     Effort: Pulmonary effort is normal. No respiratory distress.     Breath sounds: No wheezing.  Abdominal:     General: There is no distension.     Palpations: Abdomen is soft.  Musculoskeletal:        General: Normal range of motion.     Cervical back: Normal range of motion and neck supple.  Skin:  General: Skin is warm and dry.     Findings: No erythema or rash.  Neurological:     General: No focal deficit present.     Mental Status: He is alert and oriented to person, place, and time.  Psychiatric:        Mood and Affect: Mood normal.        Behavior: Behavior normal.        Thought Content: Thought content normal.        Judgment: Judgment normal.      CBC:    BMET Recent Labs    07/09/22 0311 07/10/22 0435  NA 130* 127*  K 4.5 4.7  CL 90* 92*  CO2 30 26  GLUCOSE 107* 110*  BUN 14 14  CREATININE 1.25* 1.14  CALCIUM 9.0 8.5*     Liver Panel  No results for input(s): "PROT", "ALBUMIN", "AST", "ALT", "ALKPHOS", "BILITOT", "BILIDIR", "IBILI" in the last 72 hours.     Sedimentation Rate No results for input(s): "ESRSEDRATE" in the last 72 hours. C-Reactive Protein No results for input(s): "CRP" in the last 72 hours.  Micro Results: Recent Results (from the past 720 hour(s))  Blood Culture  (routine x 2)     Status: None (Preliminary result)   Collection Time: 07/06/22  8:10 PM   Specimen: BLOOD  Result Value Ref Range Status   Specimen Description   Final    BLOOD LEFT ANTECUBITAL Performed at [facility], [address] [place], [place] [address]    Special Requests   Final    Blood Culture adequate volume BOTTLES DRAWN AEROBIC AND ANAEROBIC Performed at [facility], [address], [place], [place] [address]    Culture   Final    NO GROWTH 3 DAYS Performed at [facility], [place] [address] [place], [place] [address]    Report Status PENDING  Incomplete  Urine Culture     Status: None   Collection Time: 07/06/22  9:56 PM   Specimen: In/Out Cath Urine  Result Value Ref Range Status   Specimen Description   Final    IN/OUT CATH URINE Performed at [facility], [address] [place], [place] [address]    Special Requests   Final    NONE Performed at [facility], [address] [place], [place] [address]    Culture   Final    NO GROWTH Performed at [facility], [place] [address], [place], [place] [address]    Report Status 07/08/2022 FINAL  Final  Blood Culture (routine x 2)     Status: None (Preliminary result)   Collection Time: 07/07/22  4:38 AM   Specimen: BLOOD  Result Value Ref Range Status   Specimen Description BLOOD RIGHT ANTECUBITAL  Final   Special Requests   Final    BOTTLES DRAWN AEROBIC AND ANAEROBIC Blood Culture adequate volume   Culture   Final    NO GROWTH 2 DAYS Performed at [facility], [place] [address] [place],  [address]    Report Status PENDING  Incomplete  MRSA Next Gen by PCR, Nasal     Status: None   Collection Time: 07/10/22  7:25 AM   Specimen: Nasal Mucosa; Nasal Swab  Result Value Ref Range Status   MRSA by PCR Next Gen NOT DETECTED NOT DETECTED Final    Comment: (NOTE) The GeneXpert MRSA Assay (FDA approved for  NASAL specimens only), is one component of a comprehensive  MRSA colonization surveillance program. It is not intended to diagnose MRSA infection nor to guide or monitor treatment for MRSA infections. Test performance is not FDA approved in patients less than 46 years old. Performed at [facility], [place] [address], [place], [place] [address]     Studies/Results: No results found.    Assessment/Plan:  INTERVAL HISTORY: pt for IR guided aspirate of disk/vertebra today   Principal Problem:   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HCC) Active Problems:   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HCC)   Glaucoma   Hypertension   HLD (hyperlipidemia)   Chronic diastolic (congestive) heart failure (HCC)   BPH (benign prostatic hyperplasia)    Eric Barrett. is a 81 y.o. male with history of dementia, heart failure, mated with findings of osteomyelitisi n  thoacolumbar region. Despite protocol to NOT give systemic antibiotics to patients such as him (unless they are septic, bacteremic) he was inappropriately given vancomycin and ceftriaxone in ER, then antibiotics held several days leading to a more protracted hospitalization.  He is now due for IR guided aspiration though I am confused because radiology's notes indicate that is for L5-S1 disc aspiration where his the chart mentions thoracolumbar discitis osteomyelitis.  Provided the correct area is aspirated for culture will start him on daptomycin ceftriaxone after IR guided cultures have been obtained  ? He going to be appropriate for IV antibiotics with is dementia  I spent 52 minutes with the patient including than 50% of the time in face to face counseling of the patient re his diskitis, osteomyelitis  along with review of medical records in preparation for the visit and during the visit and in coordination of his care.  ADDENDUM: I made mistake of believing patient had TL diskitis osteomyelitis but patient simply had TL MRI with diskitis  osteomyelitis seen at L5, S1    LOS: 3 days   Alcide Evener 07/10/2022, 1:12 PM

## 2022-07-10 NOTE — Progress Notes (Signed)
PROGRESS NOTE    Eric Barrett.  UUV:[id number] DOB: 20-Sep-1940 DOA: 07/06/2022 PCP: [name], FNP    Brief Narrative:  Eric Barrett. is a 81 y.o. male with medical history significant of chronic diastolic CHF; glaucoma; dementia; CVA with L hemiparesis; HTN; and HLD presenting with back pain, MRI showing osteo of the spine.  He was last hospitalized from 10/9-18 with acute on chronic hyponatremia and MRSE UTI.  He went to rehab and returned home on 11/27.  He was experiencing some "regression" - not doing as well as he was in rehab.  He is getting home PT.  Due to the severe pain in his back, he had trouble walking, leg and arm tremor.  He has not had chronic back pain, started before last hospitalization.  He wasn't walking well starting in about August but did not really complain.  He started complaining of pain while in rehab, in the lumbar region.  He was having such severe pain that he wasn't really able to get around at home.  He was using a walker and then a wheelchair.   He had an MRI 2 days ago at [facility].   Their PCP got the result yesterday ("infection in the back", recommended IV antibiotics, "osteomyelitis at LS-S1") and told them to take him to [facility].    Assessment and Plan: Lumbar osteo -Patient presenting with progressively worsening low back pain and MRI reported diagnostic of osteomyelitis (imaging was performed at [facility] and so far information is not transferring in [place]). -He was recently admitted and was diagnosed with a UTI from oxacillin-resistant Staph Epi. -MRI was reported positive; his wife brought a disc and this has been sent to radiology for a read in our system -may need antibiotics for 4-6 weeks but interestingly his CRP and sed rate are normal -Neurosurgery consulted: There is nothing that needs to be handled acutely, Would proceed with the IR aspiration as planned. 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  -Will request IR consult to biopsy; unfortunately, he has already received 1 dose of Cefepime/Vanc.- plan for biopsy 12/4? -Will hold further abx until post-biopsy for now. - ID consult. -Pain control with Lidoderm, Oxy, morphine as needed. -PT/OT consults. -Blood cultures NGTD -schedule tylenol   Hyponatremia -mild -trend -check urine studies  Dementia -daughter reports that he has significant fluctuations in cognition -Delirium precautions ordered -Continue Namenda, depakote   HTN -Continue amlodipine, losartan   HLD -Continue rosuvastatin   Chronic diastolic CHF -Grade 1 diastolic dysfunction in 11/7423 -Appears compensated   BPH -Continue tamsulosin, Myrbetriq, finasteride   Glaucoma -Continue Travatan   Constipation -bowel regimen  DVT prophylaxis: SCDs Start: 07/07/22 1105    Code Status: Full Code   Disposition Plan:  Level of care: Med-Surg Status is: Inpatient Remains inpatient appropriate because: c/o back pain    Consultants:  Neurosurgery (phone) ID IR   Subjective: Pain worse this AM No BM in a few days  Objective: Vitals:   07/10/22 0800 07/10/22 0900 07/10/22 1000 07/10/22 1137  BP:    118/86  Pulse: 74 83 73 80  Resp: '12 19 16 15  '$ Temp:    98.2 F (36.8 C)  TempSrc:    Oral  SpO2: 96% 94% 96% 95%  Weight:      Height:        Intake/Output Summary (Last 24 hours) at 07/10/2022 1212 Last data filed at 07/10/2022 [phone]  Gross per 24 hour  Intake 1721.46 ml  Output 1795 ml  Net -73.54 ml   Filed Weights   07/06/22 1958  Weight: 82.6 kg    Examination:     General: Appearance:     Overweight male in no acute distress   Abd distended but not painful  Lungs:     respirations unlabored  Heart:    Normal heart rate.    MS:   All extremities are intact.   Neurologic:   Awake, alert           Data Reviewed: I have personally reviewed following labs and imaging studies  CBC: Recent Labs   Lab 07/06/22 2010 07/07/22 0438 07/09/22 0311 07/10/22 0435  WBC 6.8 6.2 7.2 6.3  NEUTROABS 4.6  --   --   --   HGB 13.4 11.2* 11.1* 10.9*  HCT 40.1 32.7* 33.3* 31.1*  MCV 81.7 81.5 81.0 79.1*  PLT 359 301 342 062   Basic Metabolic Panel: Recent Labs  Lab 07/06/22 2010 07/07/22 0438 07/09/22 0311 07/10/22 0435  NA 132* 132* 130* 127*  K 3.9 4.1 4.5 4.7  CL 93* 99 90* 92*  CO2 '29 27 30 26  '$ GLUCOSE 119* 115* 107* 110*  BUN '11 8 14 14  '$ CREATININE 1.29* 1.12 1.25* 1.14  CALCIUM 9.8 9.0 9.0 8.5*   GFR: Estimated Creatinine Clearance: 52.3 mL/min (by C-G formula based on SCr of 1.14 mg/dL). Liver Function Tests: Recent Labs  Lab 07/06/22 2010  AST 19  ALT 11  ALKPHOS 68  BILITOT 0.4  PROT 8.5*  ALBUMIN 4.2   No results for input(s): "LIPASE", "AMYLASE" in the last 168 hours. No results for input(s): "AMMONIA" in the last 168 hours. Coagulation Profile: Recent Labs  Lab 07/06/22 2010  INR 1.1   Cardiac Enzymes: No results for input(s): "CKTOTAL", "CKMB", "CKMBINDEX", "TROPONINI" in the last 168 hours. BNP (last 3 results) No results for input(s): "PROBNP" in the last 8760 hours. HbA1C: No results for input(s): "HGBA1C" in the last 72 hours. CBG: No results for input(s): "GLUCAP" in the last 168 hours. Lipid Profile: No results for input(s): "CHOL", "HDL", "LDLCALC", "TRIG", "CHOLHDL", "LDLDIRECT" in the last 72 hours. Thyroid Function Tests: No results for input(s): "TSH", "T4TOTAL", "FREET4", "T3FREE", "THYROIDAB" in the last 72 hours. Anemia Panel: No results for input(s): "VITAMINB12", "FOLATE", "FERRITIN", "TIBC", "IRON", "RETICCTPCT" in the last 72 hours. Sepsis Labs: Recent Labs  Lab 07/06/22 2010 07/06/22 2209  LATICACIDVEN 2.1* 1.5    Recent Results (from the past 240 hour(s))  Blood Culture (routine x 2)     Status: None (Preliminary result)   Collection Time: 07/06/22  8:10 PM   Specimen: BLOOD  Result Value Ref Range Status   Specimen  Description   Final    BLOOD LEFT ANTECUBITAL Performed at [facility], [address] [place], [place] [address]    Special Requests   Final    Blood Culture adequate volume BOTTLES DRAWN AEROBIC AND ANAEROBIC Performed at [facility], [address], [place], [place] [address]    Culture   Final    NO GROWTH 3 DAYS Performed at [facility], [place] [address], [place], [place] [address]    Report Status PENDING  Incomplete  Urine Culture     Status: None   Collection Time: 07/06/22  9:56 PM   Specimen: In/Out Cath Urine  Result Value Ref Range Status   Specimen Description   Final  IN/OUT CATH URINE Performed at [facility], [address] [place], [place] [address]    Special Requests   Final    NONE Performed at [facility], [address] [place], [place] [address]    Culture   Final    NO GROWTH Performed at [facility], [place] [address], [place], [place] [address]    Report Status 07/08/2022 FINAL  Final  Blood Culture (routine x 2)     Status: None (Preliminary result)   Collection Time: 07/07/22  4:38 AM   Specimen: BLOOD  Result Value Ref Range Status   Specimen Description BLOOD RIGHT ANTECUBITAL  Final   Special Requests   Final    BOTTLES DRAWN AEROBIC AND ANAEROBIC Blood Culture adequate volume   Culture   Final    NO GROWTH 2 DAYS Performed at [facility], [place] [address] [place], [place] [address]    Report Status PENDING  Incomplete  MRSA Next Gen by PCR, Nasal     Status: None   Collection Time: 07/10/22  7:25 AM   Specimen: Nasal Mucosa; Nasal Swab  Result Value Ref Range Status   MRSA by PCR Next Gen NOT DETECTED NOT DETECTED Final    Comment: (NOTE) The GeneXpert MRSA Assay (FDA approved for NASAL specimens only), is one component of a comprehensive MRSA colonization surveillance program. It is not intended to diagnose MRSA infection nor to  guide or monitor treatment for MRSA infections. Test performance is not FDA approved in patients less than 65 years old. Performed at [facility], [place] [address], [place],  [address]          Radiology Studies: No results found.      Scheduled Meds:  acetaminophen  1,000 mg Oral TID   amLODipine  10 mg Oral Daily   aspirin  81 mg Oral Daily   divalproex  500 mg Oral QHS   finasteride  5 mg Oral Daily   latanoprost  1 drop Both Eyes QHS   lidocaine  1 patch Transdermal Q24H   losartan  50 mg Oral Daily   memantine  10 mg Oral BID   mirabegron ER  25 mg Oral Daily   polyethylene glycol  17 g Oral BID   rosuvastatin  20 mg Oral Daily   senna-docusate  1 tablet Oral BID   tamsulosin  0.4 mg Oral Daily   Continuous Infusions:  sodium chloride 50 mL/hr at 07/10/22 0735     LOS: 3 days    Time spent: 45 minutes spent on chart review, discussion with nursing staff, consultants, updating family and interview/physical exam; more than 50% of that time was spent in counseling and/or coordination of care.    Geradine Girt, DO [facility] Available via Epic secure chat 7am-7pm After these hours, please refer to coverage provider listed on [email or web address] 07/10/2022, 12:12 PM

## 2022-07-10 NOTE — Procedures (Signed)
INR. Status post L5-S1 disc aspiration using biplane fluoroscopy. A 22-gauge Franceen biopsy needle was advanced into the L5-S1 disc space, with crossing of the midline.  Using a 20 mL syringe, approximately 2 cc of thick bloody aspirate was obtained and sent for microbiologic analysis. Hemostasis was obtained at the right paramedian puncture site at L5-S1. Patient tolerated procedure well.  Arlean Hopping MD.

## 2022-07-10 NOTE — Evaluation (Signed)
Physical Therapy Evaluation Patient Details Name: Eric Barrett. MRN: [id number] DOB: August 18, 1940 Today's Date: 07/10/2022  History of Present Illness  Eric Barrett. is a 81 y.o. male  presenting with back pain, MRI showing osteo of the spine.  He was last hospitalized from 10/9-18 with acute on chronic hyponatremia and MRSE UTI.  He went to rehab and returned home on 11/27.   Pt with medical history significant of chronic diastolic CHF; glaucoma; dementia; CVA with L hemiparesis; HTN; and HLD  Clinical Impression  Patient presents with decreased mobility due to pain, limited activity tolerance, decreased balance and history of falls.  He will benefit from skilled PT in the acute setting and from follow up STSNF level rehab at d/c.  Currently mod A for mobility in room with RW significantly limited by pain.  Previously able to ambulate with RW and S in the home.  Of note wife reports pt in rehab till 10/27 and H&P reports till 11/27.        Recommendations for follow up therapy are one component of a multi-disciplinary discharge planning process, led by the attending physician.  Recommendations may be updated based on patient status, additional functional criteria and insurance authorization.  Follow Up Recommendations Skilled nursing-short term rehab (<3 hours/day) Can patient physically be transported by private vehicle: No    Assistance Recommended at Discharge Frequent or constant Supervision/Assistance  Patient can return home with the following  A lot of help with walking and/or transfers;A lot of help with bathing/dressing/bathroom;Assistance with cooking/housework;Help with stairs or ramp for entrance    Equipment Recommendations None recommended by PT  Recommendations for Other Services       Functional Status Assessment Patient has had a recent decline in their functional status and demonstrates the ability to make significant improvements in function in a reasonable and  predictable amount of time.     Precautions / Restrictions Precautions Precautions: Fall;Back Precaution Comments: teaching back precautions for comfort      Mobility  Bed Mobility Overal bed mobility: Needs Assistance Bed Mobility: Rolling, Sidelying to Sit, Sit to Sidelying Rolling: Supervision Sidelying to sit: Min assist     Sit to sidelying: Mod assist General bed mobility comments: cues for technique, using rail, assist for balance for trunk upright, assist for legs into bed to supine for comfort    Transfers Overall transfer level: Needs assistance Equipment used: Rolling walker (2 wheels) Transfers: Sit to/from Stand Sit to Stand: Min assist, From elevated surface           General transfer comment: up from taller bed with A for balance to RW    Ambulation/Gait Ambulation/Gait assistance: Min assist Gait Distance (Feet): 12 Feet Assistive device: Rolling walker (2 wheels) Gait Pattern/deviations: Step-to pattern, Shuffle, Decreased stride length       General Gait Details: slow, tremulous and limited by pain  Stairs            Wheelchair Mobility    Modified Rankin (Stroke Patients Only)       Balance Overall balance assessment: History of Falls, Needs assistance Sitting-balance support: Feet supported Sitting balance-Leahy Scale: Fair     Standing balance support: Bilateral upper extremity supported, Reliant on assistive device for balance Standing balance-Leahy Scale: Poor                               Pertinent Vitals/Pain Pain Assessment Pain Score: 10-Worst pain  ever Pain Location: back with mobility Pain Descriptors / Indicators: Discomfort Pain Intervention(s): Monitored during session, Repositioned, Limited activity within patient's tolerance    Home Living Family/patient expects to be discharged to:: Skilled nursing facility Living Arrangements: Spouse/significant other Available Help at Discharge:  Family;Available 24 hours/day Type of Home: Apartment Home Access: Stairs to enter   Entrance Stairs-Number of Steps: 2 (1&1)   Home Layout: One level Home Equipment: Rolling Walker (2 wheels);BSC/3in1;Tub bench;Wheelchair - manual;Grab bars - tub/shower;Hand held shower head      Prior Function Prior Level of Function : Needs assist             Mobility Comments: RW for all mobility, WC for longer distances or when pt feeling weak; fell x 1 at home in kitchen, had to call son to come help him up ADLs Comments: wife assistign wtih all ADLs as needed. Sponge bathing due to fear of falling     Hand Dominance   Dominant Hand: Right    Extremity/Trunk Assessment   Upper Extremity Assessment Upper Extremity Assessment: Defer to OT evaluation    Lower Extremity Assessment Lower Extremity Assessment: RLE deficits/detail;LLE deficits/detail RLE Deficits / Details: AROM WFL, strength 4-/5 throughout LLE Deficits / Details: AROM WFL, tremulous with holding against gravity, strength grossly 3/5 LLE Coordination: decreased gross motor    Cervical / Trunk Assessment Cervical / Trunk Assessment: Kyphotic  Communication   Communication: No difficulties  Cognition Arousal/Alertness: Awake/alert Behavior During Therapy: WFL for tasks assessed/performed Overall Cognitive Status: History of cognitive impairments - at baseline                                 General Comments: h/o dementia, but aware oriented x 4        General Comments General comments (skin integrity, edema, etc.): Wife present and supportive, discussed options for rehab, pt had been at [facility] for rehab in October, but she hopes for facility closer to home.    Exercises     Assessment/Plan    PT Assessment Patient needs continued PT services  PT Problem List Decreased strength;Decreased activity tolerance;Decreased balance;Pain;Decreased mobility;Decreased knowledge of use of DME;Decreased  safety awareness       PT Treatment Interventions Gait training;DME instruction;Balance training;Neuromuscular re-education;Functional mobility training;Patient/family education;Therapeutic activities;Therapeutic exercise    PT Goals (Current goals can be found in the Care Plan section)  Acute Rehab PT Goals Patient Stated Goal: to go to rehab prior to home PT Goal Formulation: With patient/family Time For Goal Achievement: 07/24/22 Potential to Achieve Goals: Good    Frequency Min 3X/week     Co-evaluation               AM-PAC PT "6 Clicks" Mobility  Outcome Measure Help needed turning from your back to your side while in a flat bed without using bedrails?: A Little Help needed moving from lying on your back to sitting on the side of a flat bed without using bedrails?: A Lot Help needed moving to and from a bed to a chair (including a wheelchair)?: A Little Help needed standing up from a chair using your arms (e.g., wheelchair or bedside chair)?: A Lot Help needed to walk in hospital room?: Total Help needed climbing 3-5 steps with a railing? : Total 6 Click Score: 12    End of Session Equipment Utilized During Treatment: Gait belt Activity Tolerance: Patient limited by pain Patient left: in bed;with  call bell/phone within reach;with family/visitor present   PT Visit Diagnosis: Other abnormalities of gait and mobility (R26.89);Difficulty in walking, not elsewhere classified (R26.2);Pain;History of falling (Z91.81);Muscle weakness (generalized) (M62.81) Pain - part of body:  (back)    Time: 7408-1448 PT Time Calculation (min) (ACUTE ONLY): 27 min   Charges:   PT Evaluation $PT Eval Moderate Complexity: 1 Mod PT Treatments $Gait Training: 8-22 mins        Magda Kiel, PT Acute Rehabilitation Services [phone] 07/10/2022   Reginia Naas 07/10/2022, 12:18 PM

## 2022-07-11 DIAGNOSIS — Z87891 Personal history of nicotine dependence: Secondary | ICD-10-CM

## 2022-07-11 DIAGNOSIS — M4626 Osteomyelitis of vertebra, lumbar region: Secondary | ICD-10-CM | POA: Diagnosis not present

## 2022-07-11 DIAGNOSIS — I159 Secondary hypertension, unspecified: Secondary | ICD-10-CM | POA: Diagnosis not present

## 2022-07-11 DIAGNOSIS — F03B18 Unspecified dementia, moderate, with other behavioral disturbance: Secondary | ICD-10-CM

## 2022-07-11 LAB — QUANTIFERON-TB GOLD PLUS (RQFGPL)
QuantiFERON Mitogen Value: >10 IU/mL
QuantiFERON Nil Value: 0 IU/mL
QuantiFERON TB1 Ag Value: 0 IU/mL
QuantiFERON TB2 Ag Value: 0 IU/mL

## 2022-07-11 LAB — BASIC METABOLIC PANEL
Anion gap: 6 (ref 5–15)
BUN: 10 mg/dL (ref 8–23)
CO2: 26 mmol/L (ref 22–32)
Calcium: 8.5 mg/dL — ABNORMAL LOW (ref 8.9–10.3)
Chloride: 96 mmol/L — ABNORMAL LOW (ref 98–111)
Creatinine, Ser: 0.99 mg/dL (ref 0.61–1.24)
GFR, Estimated: >60 mL/min (ref >60)
Glucose, Bld: 99 mg/dL (ref 70–99)
Potassium: 4.8 mmol/L (ref 3.5–5.1)
Sodium: 128 mmol/L — ABNORMAL LOW (ref 135–145)

## 2022-07-11 LAB — OSMOLALITY: Osmolality: 274 mOsm/kg — ABNORMAL LOW (ref 275–295)

## 2022-07-11 LAB — CBC
HCT: 31 % — ABNORMAL LOW (ref 39.0–52.0)
Hemoglobin: 10.5 g/dL — ABNORMAL LOW (ref 13.0–17.0)
MCH: 27.1 pg (ref 26.0–34.0)
MCHC: 33.9 g/dL (ref 30.0–36.0)
MCV: 80.1 fL (ref 80.0–100.0)
Platelets: 297 10*3/uL (ref 150–400)
RBC: 3.87 MIL/uL — ABNORMAL LOW (ref 4.22–5.81)
RDW: 14.2 % (ref 11.5–15.5)
WBC: 6.6 10*3/uL (ref 4.0–10.5)
nRBC: 0 % (ref 0.0–0.2)

## 2022-07-11 LAB — CK: Total CK: 70 U/L (ref 49–397)

## 2022-07-11 LAB — QUANTIFERON-TB GOLD PLUS: QuantiFERON-TB Gold Plus: NEGATIVE

## 2022-07-11 LAB — CULTURE, BLOOD (ROUTINE X 2)
Culture: NO GROWTH
Special Requests: ADEQUATE

## 2022-07-11 NOTE — Progress Notes (Signed)
Mobility Specialist Progress Note   07/11/22 1800  Mobility  Activity Transferred from bed to chair  Level of Assistance Moderate assist, patient does 50-74%  Assistive Device Front wheel walker  Distance Ambulated (ft) 4 ft  Activity Response Tolerated fair  $Mobility charge 1 Mobility   Received in bed having no complaints and agreeable. MinA to EOB but ModA to stand d/t consistent tremors in LE's secondary to fatigue and pain in lower back. x2 stand a attempts before pt able to pivot and turn towards chair with confidence and moderate stability. To the chair w/o fault but RN notified about pt's increase in pain. Call bell in reach and chair alarm on.  [place] Falling Mobility Specialist Please contact via SecureChat or  Rehab office at [phone]

## 2022-07-11 NOTE — Progress Notes (Signed)
PROGRESS NOTE    Eric Barrett.  KCL:[id number] DOB: 1941-04-14 DOA: 07/06/2022 PCP: [name], FNP    Brief Narrative:  Eric Veals. is a 81 y.o. male with medical history significant of chronic diastolic CHF; glaucoma; dementia; CVA with L hemiparesis; HTN; and HLD presenting with back pain, MRI showing osteo of the spine.  He was last hospitalized from 10/9-18 with acute on chronic hyponatremia and MRSE UTI.  He went to rehab and returned home on 11/27.  He was experiencing some "regression" - not doing as well as he was in rehab.  He is getting home PT.  Due to the severe pain in his back, he had trouble walking, leg and arm tremor.  He has not had chronic back pain, started before last hospitalization.  He wasn't walking well starting in about August but did not really complain.  He started complaining of pain while in rehab, in the lumbar region.  He was having such severe pain that he wasn't really able to get around at home.  He was using a walker and then a wheelchair.   He had an MRI 2 days ago at [facility].   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    Assessment and Plan: Lumbar osteo -Patient presenting with progressively worsening low back pain and MRI reported diagnostic of osteomyelitis (imaging was performed at [facility] and so far information is not transferring in [place]). -He was recently admitted and was diagnosed with a UTI from oxacillin-resistant Staph Epi. -MRI was reported positive; his wife brought a disc and this has been sent to radiology for a read in our system - interestingly his CRP and sed rate are normal -Neurosurgery consulted: There is nothing that needs to be handled acutely, Would proceed with the IR aspiration as planned. 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  -biopsy done  12/4 -IV abx started 12/4 but did receive a dose in the ER - ID consult. -Pain control with Lidoderm, Oxy, morphine as needed. -PT/OT consults. -Blood cultures NGTD -schedule tylenol   Hyponatremia -mild -trend  Dementia - report from family that he has significant fluctuations in cognition -Delirium precautions ordered -Continue Namenda, depakote   HTN -Continue amlodipine, losartan   HLD -Continue rosuvastatin   Chronic diastolic CHF -Grade 1 diastolic dysfunction in 11/9447 -Appears compensated   BPH -Continue tamsulosin, Myrbetriq, finasteride   Glaucoma -Continue Travatan   Constipation -bowel regimen  DVT prophylaxis: SCDs Start: 07/07/22 1105    Code Status: Full Code   Disposition Plan:  Level of care: Med-Surg Status is: Inpatient Remains inpatient appropriate because: c/o back pain    Consultants:  Neurosurgery (phone) ID IR   Subjective: Back pain better at this moment  Objective: Vitals:   07/10/22 1928 07/10/22 2255 07/11/22 0320 07/11/22 0719  BP: 111/60 124/64 114/68 119/67  Pulse: 94 68 70 65  Resp: '18 18 19 17  '$ Temp: 98.4 F (36.9 C) 98.8 F (37.1 C) 98.7 F (37.1 C) 98.2 F (36.8 C)  TempSrc: Oral Oral Oral Oral  SpO2: 98% 96% 98% 97%  Weight:      Height:        Intake/Output Summary (Last 24 hours) at 07/11/2022 1122 Last data filed at 07/11/2022 1001 Gross per 24 hour  Intake 1594.62 ml  Output 2000 ml  Net -  405.38 ml   Filed Weights   07/06/22 1958  Weight: 82.6 kg    Examination:   General: Appearance:     Overweight male in no acute distress     Lungs:     respirations unlabored  Heart:    Normal heart rate.   MS:   All extremities are intact.   Neurologic:   Awake, alert             Data Reviewed: I have personally reviewed following labs and imaging studies  CBC: Recent Labs  Lab 07/06/22 2010 07/07/22 0438 07/09/22 0311 07/10/22 0435 07/11/22 0456  WBC 6.8 6.2 7.2 6.3 6.6   NEUTROABS 4.6  --   --   --   --   HGB 13.4 11.2* 11.1* 10.9* 10.5*  HCT 40.1 32.7* 33.3* 31.1* 31.0*  MCV 81.7 81.5 81.0 79.1* 80.1  PLT 359 301 342 284 950   Basic Metabolic Panel: Recent Labs  Lab 07/06/22 2010 07/07/22 0438 07/09/22 0311 07/10/22 0435 07/11/22 0456  NA 132* 132* 130* 127* 128*  K 3.9 4.1 4.5 4.7 4.8  CL 93* 99 90* 92* 96*  CO2 '29 27 30 26 26  '$ GLUCOSE 119* 115* 107* 110* 99  BUN '11 8 14 14 10  '$ CREATININE 1.29* 1.12 1.25* 1.14 0.99  CALCIUM 9.8 9.0 9.0 8.5* 8.5*   GFR: Estimated Creatinine Clearance: 60.2 mL/min (by C-G formula based on SCr of 0.99 mg/dL). Liver Function Tests: Recent Labs  Lab 07/06/22 2010  AST 19  ALT 11  ALKPHOS 68  BILITOT 0.4  PROT 8.5*  ALBUMIN 4.2   No results for input(s): "LIPASE", "AMYLASE" in the last 168 hours. No results for input(s): "AMMONIA" in the last 168 hours. Coagulation Profile: Recent Labs  Lab 07/06/22 2010  INR 1.1   Cardiac Enzymes: Recent Labs  Lab 07/11/22 0456  CKTOTAL 70   BNP (last 3 results) No results for input(s): "PROBNP" in the last 8760 hours. HbA1C: No results for input(s): "HGBA1C" in the last 72 hours. CBG: No results for input(s): "GLUCAP" in the last 168 hours. Lipid Profile: No results for input(s): "CHOL", "HDL", "LDLCALC", "TRIG", "CHOLHDL", "LDLDIRECT" in the last 72 hours. Thyroid Function Tests: No results for input(s): "TSH", "T4TOTAL", "FREET4", "T3FREE", "THYROIDAB" in the last 72 hours. Anemia Panel: No results for input(s): "VITAMINB12", "FOLATE", "FERRITIN", "TIBC", "IRON", "RETICCTPCT" in the last 72 hours. Sepsis Labs: Recent Labs  Lab 07/06/22 2010 07/06/22 2209  LATICACIDVEN 2.1* 1.5    Recent Results (from the past 240 hour(s))  Blood Culture (routine x 2)     Status: None   Collection Time: 07/06/22  8:10 PM   Specimen: BLOOD  Result Value Ref Range Status   Specimen Description   Final    BLOOD LEFT ANTECUBITAL Performed at [facility], [address] [place], [place] [address]    Special Requests   Final    Blood Culture adequate volume BOTTLES DRAWN AEROBIC AND ANAEROBIC Performed at [facility], [address] [place], [place] [address]    Culture   Final    NO GROWTH 5 DAYS Performed at [facility], [place] [address], [place], [place] [address]    Report Status 07/11/2022 FINAL  Final  Urine Culture     Status: None   Collection Time: 07/06/22  9:56 PM   Specimen: In/Out Cath Urine  Result Value Ref Range Status   Specimen Description   Final    IN/OUT  CATH URINE Performed at [facility], [address], [place], [place] [address]    Special Requests   Final    NONE Performed at [facility], [address], [place], [place] [address]    Culture   Final    NO GROWTH Performed at [facility], [place] [address], [place], [place] [address]    Report Status 07/08/2022 FINAL  Final  Blood Culture (routine x 2)     Status: None (Preliminary result)   Collection Time: 07/07/22  4:38 AM   Specimen: BLOOD  Result Value Ref Range Status   Specimen Description BLOOD RIGHT ANTECUBITAL  Final   Special Requests   Final    BOTTLES DRAWN AEROBIC AND ANAEROBIC Blood Culture adequate volume   Culture   Final    NO GROWTH 4 DAYS Performed at [facility], [place] [address], [place], [place] [address]    Report Status PENDING  Incomplete  MRSA Next Gen by PCR, Nasal     Status: None   Collection Time: 07/10/22  7:25 AM   Specimen: Nasal Mucosa; Nasal Swab  Result Value Ref Range Status   MRSA by PCR Next Gen NOT DETECTED NOT DETECTED Final    Comment: (NOTE) The GeneXpert MRSA Assay (FDA approved for NASAL specimens only), is one component of a comprehensive MRSA colonization surveillance program. It is not intended to diagnose MRSA infection nor to guide or monitor treatment for MRSA infections. Test performance is not FDA  approved in patients less than 24 years old. Performed at [facility], [place] [address] [place], [place] [address]   Aerobic/Anaerobic Culture w Gram Stain (surgical/deep wound)     Status: None (Preliminary result)   Collection Time: 07/10/22  3:44 PM   Specimen: Abscess  Result Value Ref Range Status   Specimen Description ABSCESS  Final   Special Requests LUMBAR DISC ASPIRATION  Final   Gram Stain   Final    RARE WBC PRESENT,BOTH PMN AND MONONUCLEAR NO ORGANISMS SEEN    Culture   Final    TOO YOUNG TO READ Performed at [facility], [place] [address], [place], [place] [address]    Report Status PENDING  Incomplete         Radiology Studies: IR LUMBAR DISC ASPIRATION W/IMG GUIDE  Result Date: 07/11/2022 INDICATION: Low back pain secondary to discitis/osteomyelitis at L5-S1. EXAM: FLUOROSCOPIC GUIDED DISC ASPIRATION AT L5-S1 MEDICATIONS: The patient is currently admitted to the hospital and receiving intravenous antibiotics. The antibiotics were administered within an appropriate time frame prior to the initiation of the procedure. ANESTHESIA/SEDATION: Moderate (conscious) sedation was employed during this procedure. A total of Versed 1 mg and Fentanyl 25 mcg was administered intravenously by the radiology nurse. Total intra-service moderate Sedation Time: 11 minutes. [name] level of consciousness and vital signs were monitored continuously by radiology nursing throughout the procedure under my direct supervision. COMPLICATIONS: None immediate. PROCEDURE: Informed written consent was obtained from the patient after a thorough discussion of the procedural risks, benefits and alternatives. All questions were addressed. Maximal Sterile Barrier Technique was utilized including caps, mask, sterile gowns, sterile gloves, sterile drape, hand hygiene and skin antiseptic. A timeout was performed prior to the initiation of the procedure. With the patient prone, the L5-S1 disc space  was exposed on oblique AP, and lateral views. The skin overlying the disc space was then infiltrated with 0.25% bupivacaine and carried to the deep paraspinal musculature. Under  biplane amaurosis fluoroscopy, a 40 gauge Franseen biopsy needle was advanced into the L5-S1 disc space with crossing of the midline. Using a 20 mL syringe, approximately 2 mL of blood-stained aspirate was obtained and sent for microbiologic analysis. The needle was removed. Hemostasis was achieved at the skin entry site. Patient tolerated the procedure well and was returned to the floor in stable condition. IMPRESSION: Fluoroscopic guided L5-S1 disc aspiration without event as described. Electronically Signed   By: Luanne Bras M.D.   On: 07/11/2022 08:04        Scheduled Meds:  acetaminophen  1,000 mg Oral TID   amLODipine  10 mg Oral Daily   aspirin  81 mg Oral Daily   divalproex  500 mg Oral QHS   finasteride  5 mg Oral Daily   latanoprost  1 drop Both Eyes QHS   lidocaine  1 patch Transdermal Q24H   losartan  50 mg Oral Daily   memantine  10 mg Oral BID   mirabegron ER  25 mg Oral Daily   polyethylene glycol  17 g Oral BID   rosuvastatin  20 mg Oral Daily   senna-docusate  1 tablet Oral BID   tamsulosin  0.4 mg Oral Daily   Continuous Infusions:  sodium chloride Stopped (07/10/22 1728)   cefTRIAXone (ROCEPHIN)  IV Stopped (07/10/22 1758)   DAPTOmycin (CUBICIN) 650 mg in sodium chloride 0.9 % IVPB 126 mL/hr at 07/11/22 0800     LOS: 4 days    Time spent: 45 minutes spent on chart review, discussion with nursing staff, consultants, updating family and interview/physical exam; more than 50% of that time was spent in counseling and/or coordination of care.    Geradine Girt, DO [facility] Available via Epic secure chat 7am-7pm After these hours, please refer to coverage provider listed on [email or web address] 07/11/2022, 11:22 AM

## 2022-07-11 NOTE — Progress Notes (Signed)
This chaplain responded to the consult for creating/updating the Pt. Advance Directive.   The chaplain reviewed the chart notes and was updated by the Pt. RN-Lydia before the visit. The chaplain understands the Pt. wife-[name] will return to the bedside tonight. The chaplain will attempt a revisit on Wednesday.  Chaplain Sallyanne Kuster [phone]

## 2022-07-11 NOTE — Social Work (Addendum)
TOC following - per chart review patient was at [facility] 10/18-10/27.

## 2022-07-11 NOTE — Progress Notes (Signed)
Subjective: No new complaints   Antibiotics:  Anti-infectives (From admission, onward)    Start     Dose/Rate Route Frequency Ordered Stop   07/10/22 2000  DAPTOmycin (CUBICIN) 650 mg in sodium chloride 0.9 % IVPB        8 mg/kg  82.6 kg 126 mL/hr over 30 Minutes Intravenous Daily 07/10/22 1654     07/10/22 1800  cefTRIAXone (ROCEPHIN) 2 g in sodium chloride 0.9 % 100 mL IVPB        2 g 200 mL/hr over 30 Minutes Intravenous Every 24 hours 07/10/22 1654     07/07/22 2300  vancomycin (VANCOREADY) IVPB 1250 mg/250 mL  Status:  Discontinued        1,250 mg 166.7 mL/hr over 90 Minutes Intravenous Every 24 hours 07/06/22 2312 07/07/22 1150   07/07/22 0900  ceFEPIme (MAXIPIME) 2 g in sodium chloride 0.9 % 100 mL IVPB  Status:  Discontinued        2 g 200 mL/hr over 30 Minutes Intravenous Every 12 hours 07/06/22 2330 07/07/22 1150   07/06/22 2315  vancomycin (VANCOCIN) IVPB 1000 mg/200 mL premix  Status:  Discontinued        1,000 mg 200 mL/hr over 60 Minutes Intravenous  Once 07/06/22 2301 07/06/22 2303   07/06/22 2315  vancomycin (VANCOCIN) IVPB 1000 mg/200 mL premix        1,000 mg 200 mL/hr over 60 Minutes Intravenous Every hour 07/06/22 2303 07/07/22 0132   07/06/22 2100  ceFEPIme (MAXIPIME) 2 g in sodium chloride 0.9 % 100 mL IVPB        2 g 200 mL/hr over 30 Minutes Intravenous  Once 07/06/22 2059 07/06/22 2153       Medications: Scheduled Meds:  acetaminophen  1,000 mg Oral TID   amLODipine  10 mg Oral Daily   aspirin  81 mg Oral Daily   divalproex  500 mg Oral QHS   finasteride  5 mg Oral Daily   latanoprost  1 drop Both Eyes QHS   lidocaine  1 patch Transdermal Q24H   losartan  50 mg Oral Daily   memantine  10 mg Oral BID   mirabegron ER  25 mg Oral Daily   polyethylene glycol  17 g Oral BID   rosuvastatin  20 mg Oral Daily   senna-docusate  1 tablet Oral BID   tamsulosin  0.4 mg Oral Daily   Continuous Infusions:  sodium chloride Stopped (07/10/22  1728)   cefTRIAXone (ROCEPHIN)  IV Stopped (07/10/22 1758)   DAPTOmycin (CUBICIN) 650 mg in sodium chloride 0.9 % IVPB 126 mL/hr at 07/11/22 0800   PRN Meds:.bisacodyl, hydrALAZINE, morphine injection, ondansetron **OR** ondansetron (ZOFRAN) IV, mouth rinse, oxyCODONE, traMADol    Objective: Weight change:   Intake/Output Summary (Last 24 hours) at 07/11/2022 1448 Last data filed at 07/11/2022 1232 Gross per 24 hour  Intake 1954.62 ml  Output 1475 ml  Net 479.62 ml    Blood pressure (!) 111/56, pulse 66, temperature 98.3 F (36.8 C), temperature source Oral, resp. rate 16, height '5\' 7"'$  (1.702 m), weight 82.6 kg, SpO2 97 %. Temp:  [98.2 F (36.8 C)-98.8 F (37.1 C)] 98.3 F (36.8 C) (12/05 1141) Pulse Rate:  [65-102] 66 (12/05 1141) Resp:  [12-20] 16 (12/05 1141) BP: (111-153)/(56-93) 111/56 (12/05 1141) SpO2:  [96 %-100 %] 97 % (12/05 1141)  Physical Exam: Physical Exam Constitutional:      Appearance: He is well-developed.  HENT:  Head: Normocephalic and atraumatic.  Eyes:     Conjunctiva/sclera: Conjunctivae normal.  Cardiovascular:     Rate and Rhythm: Normal rate and regular rhythm.  Pulmonary:     Effort: Pulmonary effort is normal. No respiratory distress.     Breath sounds: Normal breath sounds. No stridor. No wheezing.  Abdominal:     General: There is no distension.     Palpations: Abdomen is soft.  Musculoskeletal:        General: Normal range of motion.     Cervical back: Normal range of motion and neck supple.  Skin:    General: Skin is warm and dry.     Findings: No erythema or rash.  Neurological:     General: No focal deficit present.     Mental Status: He is alert and oriented to person, place, and time.  Psychiatric:        Mood and Affect: Mood normal.        Behavior: Behavior normal.        Thought Content: Thought content normal.        Judgment: Judgment normal.      CBC:    BMET Recent Labs    07/10/22 0435 07/11/22 0456   NA 127* 128*  K 4.7 4.8  CL 92* 96*  CO2 26 26  GLUCOSE 110* 99  BUN 14 10  CREATININE 1.14 0.99  CALCIUM 8.5* 8.5*      Liver Panel  No results for input(s): "PROT", "ALBUMIN", "AST", "ALT", "ALKPHOS", "BILITOT", "BILIDIR", "IBILI" in the last 72 hours.     Sedimentation Rate No results for input(s): "ESRSEDRATE" in the last 72 hours. C-Reactive Protein No results for input(s): "CRP" in the last 72 hours.  Micro Results: Recent Results (from the past 720 hour(s))  Blood Culture (routine x 2)     Status: None   Collection Time: 07/06/22  8:10 PM   Specimen: BLOOD  Result Value Ref Range Status   Specimen Description   Final    BLOOD LEFT ANTECUBITAL Performed at [facility], [address], [place], [place] [address]    Special Requests   Final    Blood Culture adequate volume BOTTLES DRAWN AEROBIC AND ANAEROBIC Performed at [facility], [address] [place], [place] [address]    Culture   Final    NO GROWTH 5 DAYS Performed at [facility], [place] [address], [place], [place] [address]    Report Status 07/11/2022 FINAL  Final  Urine Culture     Status: None   Collection Time: 07/06/22  9:56 PM   Specimen: In/Out Cath Urine  Result Value Ref Range Status   Specimen Description   Final    IN/OUT CATH URINE Performed at [facility], [address], [place], [place] [address]    Special Requests   Final    NONE Performed at [facility], [address], [place], [place] [address]    Culture   Final    NO GROWTH Performed at [facility], [place] [address], [place], [place] [address]    Report Status 07/08/2022 FINAL  Final  Blood Culture (routine x 2)     Status: None (Preliminary result)   Collection Time: 07/07/22  4:38 AM   Specimen: BLOOD  Result Value Ref Range Status   Specimen Description BLOOD RIGHT ANTECUBITAL  Final   Special Requests   Final     BOTTLES DRAWN AEROBIC AND ANAEROBIC  Blood Culture adequate volume   Culture   Final    NO GROWTH 4 DAYS Performed at [facility] [address] [place], [place] [address]    Report Status PENDING  Incomplete  MRSA Next Gen by PCR, Nasal     Status: None   Collection Time: 07/10/22  7:25 AM   Specimen: Nasal Mucosa; Nasal Swab  Result Value Ref Range Status   MRSA by PCR Next Gen NOT DETECTED NOT DETECTED Final    Comment: (NOTE) The GeneXpert MRSA Assay (FDA approved for NASAL specimens only), is one component of a comprehensive MRSA colonization surveillance program. It is not intended to diagnose MRSA infection nor to guide or monitor treatment for MRSA infections. Test performance is not FDA approved in patients less than 40 years old. Performed at [facility], [place] [address] [place], [place] [address]   Aerobic/Anaerobic Culture w Gram Stain (surgical/deep wound)     Status: None (Preliminary result)   Collection Time: 07/10/22  3:44 PM   Specimen: Abscess  Result Value Ref Range Status   Specimen Description ABSCESS  Final   Special Requests LUMBAR DISC ASPIRATION  Final   Gram Stain   Final    RARE WBC PRESENT,BOTH PMN AND MONONUCLEAR NO ORGANISMS SEEN    Culture   Final    TOO YOUNG TO READ Performed at [facility], [place] [address], [place],  [address]    Report Status PENDING  Incomplete    Studies/Results: IR LUMBAR DISC ASPIRATION W/IMG GUIDE  Result Date: 07/11/2022 INDICATION: Low back pain secondary to discitis/osteomyelitis at L5-S1. EXAM: FLUOROSCOPIC GUIDED DISC ASPIRATION AT L5-S1 MEDICATIONS: The patient is currently admitted to the hospital and receiving intravenous antibiotics. The antibiotics were administered within an appropriate time frame prior to the initiation of the procedure. ANESTHESIA/SEDATION: Moderate (conscious) sedation was employed during this procedure. A total of Versed 1 mg and Fentanyl 25 mcg was  administered intravenously by the radiology nurse. Total intra-service moderate Sedation Time: 11 minutes. [name] level of consciousness and vital signs were monitored continuously by radiology nursing throughout the procedure under my direct supervision. COMPLICATIONS: None immediate. PROCEDURE: Informed written consent was obtained from the patient after a thorough discussion of the procedural risks, benefits and alternatives. All questions were addressed. Maximal Sterile Barrier Technique was utilized including caps, mask, sterile gowns, sterile gloves, sterile drape, hand hygiene and skin antiseptic. A timeout was performed prior to the initiation of the procedure. With the patient prone, the L5-S1 disc space was exposed on oblique AP, and lateral views. The skin overlying the disc space was then infiltrated with 0.25% bupivacaine and carried to the deep paraspinal musculature. Under biplane amaurosis fluoroscopy, a 66 gauge Franseen biopsy needle was advanced into the L5-S1 disc space with crossing of the midline. Using a 20 mL syringe, approximately 2 mL of blood-stained aspirate was obtained and sent for microbiologic analysis. The needle was removed. Hemostasis was achieved at the skin entry site. Patient tolerated the procedure well and was returned to the floor in stable condition. IMPRESSION: Fluoroscopic guided L5-S1 disc aspiration without event as described. Electronically Signed   By: Luanne Bras M.D.   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 is a 81 y.o. male with history  of dementia, heart failure, mated with findings of osteomyelitisi on lumbosacral region. Despite protocol to NOT give systemic antibiotics to patients such as him (unless they are septic, bacteremic) he was inappropriately given vancomycin and ceftriaxone in ER, then antibiotics held several days leading to a more protracted hospitalization.   #1 Diskitis:  Cultures and hopefully they will yield a specific organism that we can target.  If no organism grows would continue him on daptomycin and ceftriaxone once daily for complete should have 6 weeks of therapy.       LOS: 4 days   Alcide Evener 07/11/2022, 2:48 PM

## 2022-07-12 DIAGNOSIS — F03B18 Unspecified dementia, moderate, with other behavioral disturbance: Secondary | ICD-10-CM | POA: Diagnosis not present

## 2022-07-12 DIAGNOSIS — M4647 Discitis, unspecified, lumbosacral region: Secondary | ICD-10-CM

## 2022-07-12 DIAGNOSIS — M4626 Osteomyelitis of vertebra, lumbar region: Secondary | ICD-10-CM | POA: Diagnosis not present

## 2022-07-12 LAB — CBC
HCT: 32.7 % — ABNORMAL LOW (ref 39.0–52.0)
Hemoglobin: 11.6 g/dL — ABNORMAL LOW (ref 13.0–17.0)
MCH: 27.7 pg (ref 26.0–34.0)
MCHC: 35.5 g/dL (ref 30.0–36.0)
MCV: 78 fL — ABNORMAL LOW (ref 80.0–100.0)
Platelets: 289 10*3/uL (ref 150–400)
RBC: 4.19 MIL/uL — ABNORMAL LOW (ref 4.22–5.81)
RDW: 14.1 % (ref 11.5–15.5)
WBC: 7.2 10*3/uL (ref 4.0–10.5)
nRBC: 0 % (ref 0.0–0.2)

## 2022-07-12 LAB — BASIC METABOLIC PANEL
Anion gap: 6 (ref 5–15)
BUN: 9 mg/dL (ref 8–23)
CO2: 26 mmol/L (ref 22–32)
Calcium: 8.6 mg/dL — ABNORMAL LOW (ref 8.9–10.3)
Chloride: 95 mmol/L — ABNORMAL LOW (ref 98–111)
Creatinine, Ser: 1.01 mg/dL (ref 0.61–1.24)
GFR, Estimated: >60 mL/min (ref >60)
Glucose, Bld: 110 mg/dL — ABNORMAL HIGH (ref 70–99)
Potassium: 4.6 mmol/L (ref 3.5–5.1)
Sodium: 127 mmol/L — ABNORMAL LOW (ref 135–145)

## 2022-07-12 LAB — CULTURE, BLOOD (ROUTINE X 2)
Culture: NO GROWTH
Special Requests: ADEQUATE

## 2022-07-12 LAB — GLUCOSE, CAPILLARY: Glucose-Capillary: 136 mg/dL — ABNORMAL HIGH (ref 70–99)

## 2022-07-12 MED ORDER — ENOXAPARIN SODIUM 40 MG/0.4ML IJ SOSY
40.0000 mg | PREFILLED_SYRINGE | INTRAMUSCULAR | Status: DC
  Administered 2022-07-12 – 2022-07-26 (×15): 40 mg via SUBCUTANEOUS
  Filled 2022-07-12 (×15): qty 0.4

## 2022-07-12 NOTE — Progress Notes (Signed)
Physical Therapy Treatment Patient Details Name: Eric Barrett. MRN: [id number] DOB: 06/09/1941 Today's Date: 07/12/2022   History of Present Illness Eric Barrett. is a 81 y.o. male  presenting with back pain, MRI showing osteo of the spine.  He was last hospitalized from 10/9-18 with acute on chronic hyponatremia and MRSE UTI.  He went to rehab and returned home on 11/27.   Pt with medical history significant of chronic diastolic CHF; glaucoma; dementia; CVA with L hemiparesis; HTN; and HLD    PT Comments    Pt tolerates treatment well, ambulating for increased distances. Pt continues to demonstrate LE weakness and motor control impairment, with tremors in BLE during all functional mobility leading to an increased risk for falls. Pt will benefit from continued aggressive mobilization in an effort to improve strength and endurance. PT provided pt with LE coordination exercise in an effort to improve BLE motor control. PT continues to recommend SNF placement.   Recommendations for follow up therapy are one component of a multi-disciplinary discharge planning process, led by the attending physician.  Recommendations may be updated based on patient status, additional functional criteria and insurance authorization.  Follow Up Recommendations  Skilled nursing-short term rehab (<3 hours/day) Can patient physically be transported by private vehicle: No   Assistance Recommended at Discharge Frequent or constant Supervision/Assistance  Patient can return home with the following A little help with walking and/or transfers;A lot of help with bathing/dressing/bathroom;Assistance with cooking/housework;Assistance with feeding;Assist for transportation;Help with stairs or ramp for entrance;Direct supervision/assist for medications management;Direct supervision/assist for financial management   Equipment Recommendations  None recommended by PT    Recommendations for Other Services        Precautions / Restrictions Precautions Precautions: Fall;Back Precaution Comments: teaching back precautions for comfort, pt with poor recall Restrictions Weight Bearing Restrictions: No     Mobility  Bed Mobility Overal bed mobility: Needs Assistance Bed Mobility: Rolling, Sidelying to Sit Rolling: Min guard Sidelying to sit: Min assist       General bed mobility comments: cues for log roll; technique    Transfers Overall transfer level: Needs assistance Equipment used: Rolling walker (2 wheels) Transfers: Sit to/from Stand Sit to Stand: Min guard           General transfer comment: verbal cues for hand placement    Ambulation/Gait Ambulation/Gait assistance: Min assist Gait Distance (Feet): 15 Feet Assistive device: Rolling walker (2 wheels) Gait Pattern/deviations: Step-to pattern Gait velocity: reduced Gait velocity interpretation: <1.31 ft/sec, indicative of household ambulator   General Gait Details: slowed step-to gait, BLE tremors in standing and when ambulating, no buckling noted   Stairs             Wheelchair Mobility    Modified Rankin (Stroke Patients Only)       Balance Overall balance assessment: History of Falls, Needs assistance Sitting-balance support: No upper extremity supported, Feet supported Sitting balance-Leahy Scale: Fair     Standing balance support: Bilateral upper extremity supported, Reliant on assistive device for balance Standing balance-Leahy Scale: Poor                              Cognition Arousal/Alertness: Awake/alert Behavior During Therapy: WFL for tasks assessed/performed Overall Cognitive Status: History of cognitive impairments - at baseline  General Comments: h/o dementia, poor retention of back precautions and cues for transfer technique        Exercises Other Exercises Other Exercises: LE coordination exercise, tapping foot on  different floor tiles to improve coordination    General Comments General comments (skin integrity, edema, etc.): tachycardia into 120s when mobilizing      Pertinent Vitals/Pain Pain Assessment Pain Assessment: Faces Faces Pain Scale: Hurts even more Pain Location: back Pain Descriptors / Indicators: Sore Pain Intervention(s): Monitored during session    Home Living                          Prior Function            PT Goals (current goals can now be found in the care plan section) Acute Rehab PT Goals Patient Stated Goal: to go to rehab prior to home Progress towards PT goals: Progressing toward goals    Frequency    Min 3X/week      PT Plan Current plan remains appropriate    Co-evaluation              AM-PAC PT "6 Clicks" Mobility   Outcome Measure  Help needed turning from your back to your side while in a flat bed without using bedrails?: A Little Help needed moving from lying on your back to sitting on the side of a flat bed without using bedrails?: A Little Help needed moving to and from a bed to a chair (including a wheelchair)?: A Little Help needed standing up from a chair using your arms (e.g., wheelchair or bedside chair)?: A Little Help needed to walk in hospital room?: Total Help needed climbing 3-5 steps with a railing? : Total 6 Click Score: 14    End of Session   Activity Tolerance: Patient tolerated treatment well Patient left: in chair;with call bell/phone within reach;with chair alarm set Nurse Communication: Mobility status PT Visit Diagnosis: Other abnormalities of gait and mobility (R26.89);Difficulty in walking, not elsewhere classified (R26.2);Pain;History of falling (Z91.81);Muscle weakness (generalized) (M62.81) Pain - part of body:  (back)     Time: 1125-1140 PT Time Calculation (min) (ACUTE ONLY): 15 min  Charges:  $Therapeutic Activity: 8-22 mins                     Zenaida Niece, PT, DPT Acute  Rehabilitation Office [name] 07/12/2022, 12:11 PM

## 2022-07-12 NOTE — Progress Notes (Signed)
Occupational Therapy Treatment Patient Details Name: Eric Barrett. MRN: [id number] DOB: 29-May-1941 Today's Date: 07/12/2022   History of present illness Eric Barrett. is a 81 y.o. male  presenting with back pain, MRI showing osteo of the spine.  He was last hospitalized from 10/9-18 with acute on chronic hyponatremia and MRSE UTI.  He went to rehab and returned home on 11/27.   Pt with medical history significant of chronic diastolic CHF; glaucoma; dementia; CVA with L hemiparesis; HTN; and HLD   OT comments  Pt is making steady progress. He completed bed mobility and transfers with min A and cues to maintain back precautions for comfort. Pt completed grooming tasks while sitting EOB. He was limited by back pain and fatigue as he just returned to bed after a walk with mobility. OT to continue to follow acutely. POC remains appropriate.    Recommendations for follow up therapy are one component of a multi-disciplinary discharge planning process, led by the attending physician.  Recommendations may be updated based on patient status, additional functional criteria and insurance authorization.    Follow Up Recommendations  Skilled nursing-short term rehab (<3 hours/day)     Assistance Recommended at Discharge Frequent or constant Supervision/Assistance  Patient can return home with the following  A lot of help with walking and/or transfers;A lot of help with bathing/dressing/bathroom;Assistance with cooking/housework;Assist for transportation;Help with stairs or ramp for entrance   Equipment Recommendations  None recommended by OT       Precautions / Restrictions Precautions Precautions: Fall;Back Precaution Comments: teaching back precautions for comfort, pt with poor recall Restrictions Weight Bearing Restrictions: No       Mobility Bed Mobility Overal bed mobility: Needs Assistance Bed Mobility: Rolling, Sidelying to Sit Rolling: Min guard Sidelying to sit: Min  assist            Transfers Overall transfer level: Needs assistance Equipment used: Rolling walker (2 wheels) Transfers: Sit to/from Stand Sit to Stand: Min assist                 Balance Overall balance assessment: History of Falls, Needs assistance Sitting-balance support: No upper extremity supported, Feet supported Sitting balance-Leahy Scale: Fair     Standing balance support: Bilateral upper extremity supported, Reliant on assistive device for balance Standing balance-Leahy Scale: Poor                             ADL either performed or assessed with clinical judgement   ADL Overall ADL's : Needs assistance/impaired Eating/Feeding: Independent;Sitting   Grooming: Set up;Sitting                               Functional mobility during ADLs: Cueing for safety;Cueing for sequencing;Rolling walker (2 wheels);Minimal assistance General ADL Comments: limited by back pain and just finishing a walk with MS    Extremity/Trunk Assessment Upper Extremity Assessment Upper Extremity Assessment: LUE deficits/detail LUE Deficits / Details: weaker than R but WFL for functional tasks LUE Sensation: WNL LUE Coordination: decreased fine motor   Lower Extremity Assessment Lower Extremity Assessment: Defer to PT evaluation        Vision   Vision Assessment?: No apparent visual deficits   Perception Perception Perception: Not tested   Praxis Praxis Praxis: Not tested    Cognition Arousal/Alertness: Awake/alert Behavior During Therapy: WFL for tasks assessed/performed Overall Cognitive Status: History  of cognitive impairments - at baseline                                 General Comments: h/o dementia, poor retention of back precautions and cues for transfer technique              General Comments VSS on RA    Pertinent Vitals/ Pain       Pain Assessment Pain Assessment: Faces Faces Pain Scale: Hurts even more Pain  Location: back Pain Descriptors / Indicators: Grimacing, Discomfort Pain Intervention(s): Limited activity within patient's tolerance, Monitored during session   Frequency  Min 2X/week        Progress Toward Goals  OT Goals(current goals can now be found in the care plan section)  Progress towards OT goals: Progressing toward goals  Acute Rehab OT Goals Patient Stated Goal: to eat dinner OT Goal Formulation: With patient Time For Goal Achievement: 07/23/22 Potential to Achieve Goals: Good ADL Goals Pt Will Perform Grooming: with supervision;standing Pt Will Perform Lower Body Dressing: with min assist;sit to/from stand Pt Will Transfer to Toilet: with min guard assist;ambulating Additional ADL Goal #1: Pt will indep complete bed mobility as a precursor to ADLs  Plan Discharge plan remains appropriate       AM-PAC OT "6 Clicks" Daily Activity     Outcome Measure   Help from another person eating meals?: None Help from another person taking care of personal grooming?: A Little Help from another person toileting, which includes using toliet, bedpan, or urinal?: A Lot Help from another person bathing (including washing, rinsing, drying)?: A Lot Help from another person to put on and taking off regular upper body clothing?: A Little Help from another person to put on and taking off regular lower body clothing?: A Lot 6 Click Score: 16    End of Session Equipment Utilized During Treatment: Rolling walker (2 wheels)  OT Visit Diagnosis: Unsteadiness on feet (R26.81);Muscle weakness (generalized) (M62.81);Other abnormalities of gait and mobility (R26.89)   Activity Tolerance Patient tolerated treatment well   Patient Left in bed;with call bell/phone within reach;with bed alarm set;with family/visitor present   Nurse Communication Mobility status        Time: 6226-3335 OT Time Calculation (min): 17 min  Charges: OT General Charges $OT Visit: 1 Visit OT  Treatments $Self Care/Home Management : 8-22 mins    Elliot Cousin 07/12/2022, 5:06 PM

## 2022-07-12 NOTE — Progress Notes (Signed)
[facility]  PROGRESS NOTE  Eric Barrett. GBT:[id number] DOB: 31-Mar-1941 DOA: 07/06/2022 PCP: [name], FNP   Brief HPI:   81 year old male with medical history of chronic diastolic CHF, glucoma, dementia, CVA with left hemiparesis, hypertension, hyperlipidemia presents with back pain.  MRI showed osteomyelitis of the spine.  He was hospitalized from 10/9 to 10/18 with acute on chronic hyponatremia and MRSE UTI.  He went to rehab and returned home on 11/27.  Patient had trouble walking at home due to severe back pain.  Patient was complaining of pain in the back while in the rehab.  He was using walker and then wheelchair.  Had MRI of the lumbar spine 2 days ago at [facility].  PCP got result and told him to go to the hospital for infection, osteomyelitis at L5-S1.  Patient went to [facility].    Subjective   Still complains of back pain.   Assessment/Plan:    Lumbar osteomyelitis -Patient presented with worsening low back pain, MRI reportedly was diagnostic for osteomyelitis -imaging was performed at [facility], no information available in Care Everywhere -He was recently mated and was diagnosed with UTI from oxacillin resistant Staph epidermidis -Patient's wife brought MRI report on disc, was given to radiology in our hospital -Patient CRP and sed rate are normal -Neurosurgery consulted, no intervention recommended -IR consulted for aspiration -Biopsy done on 12/4 -IV antibiotics started per ID -Continue pain control with Lidoderm, oxycodone, morphine as needed -Blood cultures negative to date  Hyponatremia -Mild, unclear etiology -Check serum and urine osmolarity  Dementia -No behavior disturbance -Continue Depakote, Namenda  Hypertension -Continue amlodipine, losartan  Hyperlipidemia -Continue rosuvastatin  Chronic diastolic CHF -Compensated at this time  BPH -Continue tamsulosin, Myrbetriq, finasteride  Glaucoma -Continue  Travatan       Medications     acetaminophen  1,000 mg Oral TID   amLODipine  10 mg Oral Daily   aspirin  81 mg Oral Daily   divalproex  500 mg Oral QHS   enoxaparin (LOVENOX) injection  40 mg Subcutaneous Q24H   finasteride  5 mg Oral Daily   latanoprost  1 drop Both Eyes QHS   lidocaine  1 patch Transdermal Q24H   losartan  50 mg Oral Daily   memantine  10 mg Oral BID   mirabegron ER  25 mg Oral Daily   polyethylene glycol  17 g Oral BID   rosuvastatin  20 mg Oral Daily   senna-docusate  1 tablet Oral BID   tamsulosin  0.4 mg Oral Daily     Data Reviewed:   CBG:  No results for input(s): "GLUCAP" in the last 168 hours.  SpO2: 99 % O2 Flow Rate (L/min): 2 L/min    Vitals:   07/12/22 0302 07/12/22 0705 07/12/22 0837 07/12/22 1107  BP: 118/69 (!) 149/71 139/64 (!) 143/84  Pulse: 65 74  75  Resp: '16 17  17  '$ Temp: 97.8 F (36.6 C) 98.4 F (36.9 C)  (!) 97.3 F (36.3 C)  TempSrc: Oral Oral  Oral  SpO2: 100% 99%  99%  Weight:      Height:          Data Reviewed:  Basic Metabolic Panel: Recent Labs  Lab 07/07/22 0438 07/09/22 0311 07/10/22 0435 07/11/22 0456 07/12/22 0612  NA 132* 130* 127* 128* 127*  K 4.1 4.5 4.7 4.8 4.6  CL 99 90* 92* 96* 95*  CO2 '27 30 26 26 26  '$ GLUCOSE 115*  107* 110* 99 110*  BUN '8 14 14 10 9  '$ CREATININE 1.12 1.25* 1.14 0.99 1.01  CALCIUM 9.0 9.0 8.5* 8.5* 8.6*    CBC: Recent Labs  Lab 07/06/22 2010 07/07/22 0438 07/09/22 0311 07/10/22 0435 07/11/22 0456 07/12/22 0612  WBC 6.8 6.2 7.2 6.3 6.6 7.2  NEUTROABS 4.6  --   --   --   --   --   HGB 13.4 11.2* 11.1* 10.9* 10.5* 11.6*  HCT 40.1 32.7* 33.3* 31.1* 31.0* 32.7*  MCV 81.7 81.5 81.0 79.1* 80.1 78.0*  PLT 359 301 342 284 297 289    LFT Recent Labs  Lab 07/06/22 2010  AST 19  ALT 11  ALKPHOS 68  BILITOT 0.4  PROT 8.5*  ALBUMIN 4.2     Antibiotics: Anti-infectives (From admission, onward)    Start     Dose/Rate Route Frequency Ordered Stop    07/10/22 2000  DAPTOmycin (CUBICIN) 650 mg in sodium chloride 0.9 % IVPB        8 mg/kg  82.6 kg 126 mL/hr over 30 Minutes Intravenous Daily 07/10/22 1654     07/10/22 1800  cefTRIAXone (ROCEPHIN) 2 g in sodium chloride 0.9 % 100 mL IVPB        2 g 200 mL/hr over 30 Minutes Intravenous Every 24 hours 07/10/22 1654     07/07/22 2300  vancomycin (VANCOREADY) IVPB 1250 mg/250 mL  Status:  Discontinued        1,250 mg 166.7 mL/hr over 90 Minutes Intravenous Every 24 hours 07/06/22 2312 07/07/22 1150   07/07/22 0900  ceFEPIme (MAXIPIME) 2 g in sodium chloride 0.9 % 100 mL IVPB  Status:  Discontinued        2 g 200 mL/hr over 30 Minutes Intravenous Every 12 hours 07/06/22 2330 07/07/22 1150   07/06/22 2315  vancomycin (VANCOCIN) IVPB 1000 mg/200 mL premix  Status:  Discontinued        1,000 mg 200 mL/hr over 60 Minutes Intravenous  Once 07/06/22 2301 07/06/22 2303   07/06/22 2315  vancomycin (VANCOCIN) IVPB 1000 mg/200 mL premix        1,000 mg 200 mL/hr over 60 Minutes Intravenous Every hour 07/06/22 2303 07/07/22 0132   07/06/22 2100  ceFEPIme (MAXIPIME) 2 g in sodium chloride 0.9 % 100 mL IVPB        2 g 200 mL/hr over 30 Minutes Intravenous  Once 07/06/22 2059 07/06/22 2153        DVT prophylaxis: Lovenox  Code Status: Full code  Family Communication: No family at bedside   CONSULTS infectious disease   Objective    Physical Examination:   General-appears in no acute distress Heart-S1-S2, regular, no murmur auscultated Lungs-clear to auscultation bilaterally, no wheezing or crackles auscultated Abdomen-soft, nontender, no organomegaly Extremities-no edema in the lower extremities Neuro-alert, oriented x3, no focal deficit noted   Status is: Inpatient:        [name]   [facility] If 7PM-7AM, please contact night-coverage at [email or web address], Office  [phone]   07/12/2022, 5:04 PM  LOS: 5 days

## 2022-07-12 NOTE — Progress Notes (Addendum)
This chaplain is present for creating/updating the Pt. AD. The Pt. is awake and greeted the chaplain.  The chaplain understands the Pt. is not familiar with HCPOA.  The Pt. shared he is expecting his wife before noon today. The chaplain will attempt to simultaneously provide AD education for the Pt. and Pt. wife at the next visit.  ** 1219 The chaplain is present at the bedside and understands from the Pt., the Pt. wife is on her way.  This chaplain will attempt a revisit.   Chaplain Sallyanne Kuster [phone]

## 2022-07-12 NOTE — Progress Notes (Signed)
Subjective:  No new complaints   Antibiotics:  Anti-infectives (From admission, onward)    Start     Dose/Rate Route Frequency Ordered Stop   07/10/22 2000  DAPTOmycin (CUBICIN) 650 mg in sodium chloride 0.9 % IVPB        8 mg/kg  82.6 kg 126 mL/hr over 30 Minutes Intravenous Daily 07/10/22 1654     07/10/22 1800  cefTRIAXone (ROCEPHIN) 2 g in sodium chloride 0.9 % 100 mL IVPB        2 g 200 mL/hr over 30 Minutes Intravenous Every 24 hours 07/10/22 1654     07/07/22 2300  vancomycin (VANCOREADY) IVPB 1250 mg/250 mL  Status:  Discontinued        1,250 mg 166.7 mL/hr over 90 Minutes Intravenous Every 24 hours 07/06/22 2312 07/07/22 1150   07/07/22 0900  ceFEPIme (MAXIPIME) 2 g in sodium chloride 0.9 % 100 mL IVPB  Status:  Discontinued        2 g 200 mL/hr over 30 Minutes Intravenous Every 12 hours 07/06/22 2330 07/07/22 1150   07/06/22 2315  vancomycin (VANCOCIN) IVPB 1000 mg/200 mL premix  Status:  Discontinued        1,000 mg 200 mL/hr over 60 Minutes Intravenous  Once 07/06/22 2301 07/06/22 2303   07/06/22 2315  vancomycin (VANCOCIN) IVPB 1000 mg/200 mL premix        1,000 mg 200 mL/hr over 60 Minutes Intravenous Every hour 07/06/22 2303 07/07/22 0132   07/06/22 2100  ceFEPIme (MAXIPIME) 2 g in sodium chloride 0.9 % 100 mL IVPB        2 g 200 mL/hr over 30 Minutes Intravenous  Once 07/06/22 2059 07/06/22 2153       Medications: Scheduled Meds:  acetaminophen  1,000 mg Oral TID   amLODipine  10 mg Oral Daily   aspirin  81 mg Oral Daily   divalproex  500 mg Oral QHS   finasteride  5 mg Oral Daily   latanoprost  1 drop Both Eyes QHS   lidocaine  1 patch Transdermal Q24H   losartan  50 mg Oral Daily   memantine  10 mg Oral BID   mirabegron ER  25 mg Oral Daily   polyethylene glycol  17 g Oral BID   rosuvastatin  20 mg Oral Daily   senna-docusate  1 tablet Oral BID   tamsulosin  0.4 mg Oral Daily   Continuous Infusions:  sodium chloride Stopped  (07/10/22 1728)   cefTRIAXone (ROCEPHIN)  IV 2 g (07/11/22 1722)   DAPTOmycin (CUBICIN) 650 mg in sodium chloride 0.9 % IVPB 650 mg (07/11/22 2132)   PRN Meds:.bisacodyl, hydrALAZINE, morphine injection, ondansetron **OR** ondansetron (ZOFRAN) IV, mouth rinse, oxyCODONE, traMADol    Objective: Weight change:   Intake/Output Summary (Last 24 hours) at 07/12/2022 1155 Last data filed at 07/12/2022 1119 Gross per 24 hour  Intake 957 ml  Output 2600 ml  Net -1643 ml    Blood pressure (!) 143/84, pulse 75, temperature (!) 97.3 F (36.3 C), temperature source Oral, resp. rate 17, height '5\' 7"'$  (1.702 m), weight 82.6 kg, SpO2 99 %. Temp:  [97.3 F (36.3 C)-98.4 F (36.9 C)] 97.3 F (36.3 C) (12/06 1107) Pulse Rate:  [63-75] 75 (12/06 1107) Resp:  [16-20] 17 (12/06 1107) BP: (115-149)/(57-84) 143/84 (12/06 1107) SpO2:  [98 %-100 %] 99 % (12/06 1107)  Physical Exam: Physical Exam Constitutional:      Appearance: He is well-developed.  HENT:     Head: Normocephalic and atraumatic.  Eyes:     Conjunctiva/sclera: Conjunctivae normal.  Cardiovascular:     Rate and Rhythm: Normal rate and regular rhythm.  Pulmonary:     Effort: Pulmonary effort is normal. No respiratory distress.     Breath sounds: Normal breath sounds. No stridor. No wheezing.  Abdominal:     General: There is no distension.     Palpations: Abdomen is soft.  Musculoskeletal:        General: Normal range of motion.     Cervical back: Normal range of motion and neck supple.  Skin:    General: Skin is warm and dry.     Findings: No erythema or rash.  Neurological:     General: No focal deficit present.     Mental Status: He is alert and oriented to person, place, and time.  Psychiatric:        Mood and Affect: Mood normal.        Behavior: Behavior normal.        Thought Content: Thought content normal.        Judgment: Judgment normal.      CBC:    BMET Recent Labs    07/11/22 0456 07/12/22 0612   NA 128* 127*  K 4.8 4.6  CL 96* 95*  CO2 26 26  GLUCOSE 99 110*  BUN 10 9  CREATININE 0.99 1.01  CALCIUM 8.5* 8.6*      Liver Panel  No results for input(s): "PROT", "ALBUMIN", "AST", "ALT", "ALKPHOS", "BILITOT", "BILIDIR", "IBILI" in the last 72 hours.     Sedimentation Rate No results for input(s): "ESRSEDRATE" in the last 72 hours. C-Reactive Protein No results for input(s): "CRP" in the last 72 hours.  Micro Results: Recent Results (from the past 720 hour(s))  Blood Culture (routine x 2)     Status: None   Collection Time: 07/06/22  8:10 PM   Specimen: BLOOD  Result Value Ref Range Status   Specimen Description   Final    BLOOD LEFT ANTECUBITAL Performed at [facility], [address], [place], [place] [address]    Special Requests   Final    Blood Culture adequate volume BOTTLES DRAWN AEROBIC AND ANAEROBIC Performed at [facility], [address] [place], [place] [address]    Culture   Final    NO GROWTH 5 DAYS Performed at [facility], [place] [address], [place], [place] [address]    Report Status 07/11/2022 FINAL  Final  Urine Culture     Status: None   Collection Time: 07/06/22  9:56 PM   Specimen: In/Out Cath Urine  Result Value Ref Range Status   Specimen Description   Final    IN/OUT CATH URINE Performed at [facility], [address] [place], [place] [address]    Special Requests   Final    NONE Performed at [facility], [address] [place], [place] [address]    Culture   Final    NO GROWTH Performed at [facility], [place] [address], [place], [place] [address]    Report Status 07/08/2022 FINAL  Final  Blood Culture (routine x 2)     Status: None   Collection Time: 07/07/22  4:38 AM   Specimen: BLOOD  Result Value Ref Range Status   Specimen Description BLOOD RIGHT ANTECUBITAL  Final   Special Requests   Final    BOTTLES DRAWN AEROBIC  AND  ANAEROBIC Blood Culture adequate volume   Culture   Final    NO GROWTH 5 DAYS Performed at [facility], [place] [address], [place], [place] [address]    Report Status 07/12/2022 FINAL  Final  MRSA Next Gen by PCR, Nasal     Status: None   Collection Time: 07/10/22  7:25 AM   Specimen: Nasal Mucosa; Nasal Swab  Result Value Ref Range Status   MRSA by PCR Next Gen NOT DETECTED NOT DETECTED Final    Comment: (NOTE) The GeneXpert MRSA Assay (FDA approved for NASAL specimens only), is one component of a comprehensive MRSA colonization surveillance program. It is not intended to diagnose MRSA infection nor to guide or monitor treatment for MRSA infections. Test performance is not FDA approved in patients less than 67 years old. Performed at [facility], [place] [address], [place], [place] [address]   Aerobic/Anaerobic Culture w Gram Stain (surgical/deep wound)     Status: None (Preliminary result)   Collection Time: 07/10/22  3:44 PM   Specimen: Abscess  Result Value Ref Range Status   Specimen Description ABSCESS  Final   Special Requests LUMBAR DISC ASPIRATION  Final   Gram Stain   Final    RARE WBC PRESENT,BOTH PMN AND MONONUCLEAR NO ORGANISMS SEEN    Culture   Final    TOO YOUNG TO READ Performed at [facility], [place] [address], [place], [place] [address]    Report Status PENDING  Incomplete    Studies/Results: IR LUMBAR DISC ASPIRATION W/IMG GUIDE  Result Date: 07/11/2022 INDICATION: Low back pain secondary to discitis/osteomyelitis at L5-S1. EXAM: FLUOROSCOPIC GUIDED DISC ASPIRATION AT L5-S1 MEDICATIONS: The patient is currently admitted to the hospital and receiving intravenous antibiotics. The antibiotics were administered within an appropriate time frame prior to the initiation of the procedure. ANESTHESIA/SEDATION: Moderate (conscious) sedation was employed during this procedure. A total of Versed 1 mg and Fentanyl 25 mcg was administered intravenously by the  radiology nurse. Total intra-service moderate Sedation Time: 11 minutes. [name] level of consciousness and vital signs were monitored continuously by radiology nursing throughout the procedure under my direct supervision. COMPLICATIONS: None immediate. PROCEDURE: Informed written consent was obtained from the patient after a thorough discussion of the procedural risks, benefits and alternatives. All questions were addressed. Maximal Sterile Barrier Technique was utilized including caps, mask, sterile gowns, sterile gloves, sterile drape, hand hygiene and skin antiseptic. A timeout was performed prior to the initiation of the procedure. With the patient prone, the L5-S1 disc space was exposed on oblique AP, and lateral views. The skin overlying the disc space was then infiltrated with 0.25% bupivacaine and carried to the deep paraspinal musculature. Under biplane amaurosis fluoroscopy, a 69 gauge Franseen biopsy needle was advanced into the L5-S1 disc space with crossing of the midline. Using a 20 mL syringe, approximately 2 mL of blood-stained aspirate was obtained and sent for microbiologic analysis. The needle was removed. Hemostasis was achieved at the skin entry site. Patient tolerated the procedure well and was returned to the floor in stable condition. IMPRESSION: Fluoroscopic guided L5-S1 disc aspiration without event as described. Electronically Signed   By: Luanne Bras M.D.   On: 07/11/2022 08:04      Assessment/Plan:  INTERVAL HISTORY:   Alters still "too young to read"  Principal Problem:   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HCC) Active Problems:   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[place])   Glaucoma   Hypertension  HLD (hyperlipidemia)   Chronic diastolic (congestive) heart failure (HCC)   BPH (benign prostatic hyperplasia)    Eric Barrett. is a 81 y.o. male with history of dementia, heart failure, mated with findings of osteomyelitisi on lumbosacral region. Despite protocol to NOT  give systemic antibiotics to patients such as him (unless they are septic, bacteremic) he was inappropriately given vancomycin and ceftriaxone in ER, then antibiotics held several days leading to a more protracted hospitalization.   #1  Discitis:  Will continue to follow the cultures in the interim we will continue daptomycin and Teflaro.        LOS: 5 days   Alcide Evener 07/12/2022, 11:55 AM

## 2022-07-12 NOTE — Progress Notes (Signed)
Mobility Specialist Progress Note   07/12/22 1630  Mobility  Activity Ambulated with assistance in room  Level of Assistance Minimal assist, patient does 75% or more  Assistive Device Front wheel walker  Distance Ambulated (ft) 12 ft  Activity Response Tolerated well  $Mobility charge 1 Mobility   Pre Mobility: 68 HR, 121/67 BP, 97% SpO2 on RA During Mobility: 89 HR, 92% SpO2 on RA Post Mobility: 97 HR, 126/67 BP, 100% SpO2 on RA  Received in bed asleep but easily aroused. Able to get to EOB w/ min cues on hand placement. MinA on initial stand, pt presenting w/ LE  tremors throughout  ambulation but no faults witness. x1 seated break d/t fatigue and back pain. Able to return back to bed, once supine pt expressing increase in there LBP   [place] Specialist Please contact via [place] or  Rehab office at [phone]

## 2022-07-13 ENCOUNTER — Inpatient Hospital Stay: Payer: Self-pay

## 2022-07-13 DIAGNOSIS — M4647 Discitis, unspecified, lumbosacral region: Secondary | ICD-10-CM | POA: Diagnosis not present

## 2022-07-13 DIAGNOSIS — M4626 Osteomyelitis of vertebra, lumbar region: Secondary | ICD-10-CM | POA: Diagnosis not present

## 2022-07-13 DIAGNOSIS — E785 Hyperlipidemia, unspecified: Secondary | ICD-10-CM

## 2022-07-13 DIAGNOSIS — I5032 Chronic diastolic (congestive) heart failure: Secondary | ICD-10-CM | POA: Diagnosis not present

## 2022-07-13 DIAGNOSIS — F03B18 Unspecified dementia, moderate, with other behavioral disturbance: Secondary | ICD-10-CM | POA: Diagnosis not present

## 2022-07-13 LAB — CBC
HCT: 31.4 % — ABNORMAL LOW (ref 39.0–52.0)
Hemoglobin: 11 g/dL — ABNORMAL LOW (ref 13.0–17.0)
MCH: 27.8 pg (ref 26.0–34.0)
MCHC: 35 g/dL (ref 30.0–36.0)
MCV: 79.5 fL — ABNORMAL LOW (ref 80.0–100.0)
Platelets: 275 10*3/uL (ref 150–400)
RBC: 3.95 MIL/uL — ABNORMAL LOW (ref 4.22–5.81)
RDW: 14.1 % (ref 11.5–15.5)
WBC: 5.7 10*3/uL (ref 4.0–10.5)
nRBC: 0 % (ref 0.0–0.2)

## 2022-07-13 LAB — COMPREHENSIVE METABOLIC PANEL
ALT: 10 U/L (ref 0–44)
AST: 16 U/L (ref 15–41)
Albumin: 2.4 g/dL — ABNORMAL LOW (ref 3.5–5.0)
Alkaline Phosphatase: 47 U/L (ref 38–126)
Anion gap: 8 (ref 5–15)
BUN: 10 mg/dL (ref 8–23)
CO2: 27 mmol/L (ref 22–32)
Calcium: 8.6 mg/dL — ABNORMAL LOW (ref 8.9–10.3)
Chloride: 93 mmol/L — ABNORMAL LOW (ref 98–111)
Creatinine, Ser: 1.1 mg/dL (ref 0.61–1.24)
GFR, Estimated: >60 mL/min (ref >60)
Glucose, Bld: 104 mg/dL — ABNORMAL HIGH (ref 70–99)
Potassium: 5.2 mmol/L — ABNORMAL HIGH (ref 3.5–5.1)
Sodium: 128 mmol/L — ABNORMAL LOW (ref 135–145)
Total Bilirubin: 0.3 mg/dL (ref 0.3–1.2)
Total Protein: 6.1 g/dL — ABNORMAL LOW (ref 6.5–8.1)

## 2022-07-13 LAB — OSMOLALITY: Osmolality: 277 mOsm/kg (ref 275–295)

## 2022-07-13 LAB — OSMOLALITY, URINE: Osmolality, Ur: 443 mOsm/kg (ref 300–900)

## 2022-07-13 MED ORDER — PNEUMOCOCCAL 20-VAL CONJ VACC 0.5 ML IM SUSY
0.5000 mL | PREFILLED_SYRINGE | INTRAMUSCULAR | Status: AC
  Administered 2022-07-15: 0.5 mL via INTRAMUSCULAR
  Filled 2022-07-13: qty 0.5

## 2022-07-13 MED ORDER — CHLORHEXIDINE GLUCONATE CLOTH 2 % EX PADS
6.0000 | MEDICATED_PAD | Freq: Every day | CUTANEOUS | Status: DC
  Administered 2022-07-13 – 2022-07-26 (×13): 6 via TOPICAL

## 2022-07-13 MED ORDER — SODIUM ZIRCONIUM CYCLOSILICATE 5 G PO PACK
5.0000 g | PACK | Freq: Once | ORAL | Status: AC
  Administered 2022-07-13: 5 g via ORAL
  Filled 2022-07-13: qty 1

## 2022-07-13 MED ORDER — SODIUM CHLORIDE 0.9% FLUSH
10.0000 mL | Freq: Two times a day (BID) | INTRAVENOUS | Status: DC
  Administered 2022-07-13 – 2022-07-18 (×9): 10 mL
  Administered 2022-07-19: 20 mL
  Administered 2022-07-19 – 2022-07-20 (×2): 10 mL
  Administered 2022-07-20: 20 mL
  Administered 2022-07-21 – 2022-07-22 (×3): 10 mL
  Administered 2022-07-22: 20 mL
  Administered 2022-07-23 – 2022-07-24 (×3): 10 mL
  Administered 2022-07-24: 30 mL
  Administered 2022-07-25: 20 mL
  Administered 2022-07-25 – 2022-07-26 (×2): 10 mL

## 2022-07-13 MED ORDER — SODIUM CHLORIDE 0.9% FLUSH
10.0000 mL | INTRAVENOUS | Status: DC | PRN
  Administered 2022-07-14: 10 mL

## 2022-07-13 MED ORDER — INFLUENZA VAC A&B SA ADJ QUAD 0.5 ML IM PRSY
0.5000 mL | PREFILLED_SYRINGE | INTRAMUSCULAR | Status: AC
  Administered 2022-07-15: 0.5 mL via INTRAMUSCULAR
  Filled 2022-07-13: qty 0.5

## 2022-07-13 NOTE — NC FL2 (Signed)
[place] LEVEL OF CARE FORM     IDENTIFICATION  Patient Name: Eric Barrett. Birthdate: 12-04-1940 Sex: male Admission Date (Current Location): 07/06/2022  [facility] and [place] Number:  Herbalist and Address:  [facility], [place] [address] [place], [place] [address]      Provider Number: [id number]  Attending Physician Name and Address:  [name], MD  Relative Name and Phone Number:  Lelon Frohlich (Spouse)    Current Level of Care: Hospital Recommended Level of Care: [address] Prior Approval Number:    Date Approved/Denied:   PASRR Number: [id number]  Discharge Plan: SNF    Current Diagnoses: Patient Active Problem List   Diagnosis Date Noted   Glaucoma 07/07/2022   Hypertension 07/07/2022   HLD (hyperlipidemia) 07/07/2022   Chronic diastolic (congestive) heart failure ([place]) 07/07/2022   BPH (benign prostatic hyperplasia) 07/07/2022   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[place]) 07/06/2022   Change in bowel habits    Constipation    Abnormal CT scan, colon    Benign neoplasm of sigmoid colon    Hyponatremia 05/15/2022   Alteration consciousness 01/19/2022   Dementia ([place]) 01/19/2022   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[place]) 08/02/2021   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 ([place]) 08/02/2021   Memory disturbance 01/27/2021   History of stroke 11/27/2019   Gait abnormality 11/27/2019    Orientation RESPIRATION BLADDER Height & Weight     Self, Time, Situation, Place (WDL)  Normal Continent, External catheter (External Urinary Catheter) Weight: 182 lb (82.6 kg) Height:  '5\' 7"'$  (170.2 cm)  BEHAVIORAL SYMPTOMS/MOOD NEUROLOGICAL BOWEL NUTRITION STATUS      Continent Diet (Please see discharge summary)  AMBULATORY STATUS COMMUNICATION OF NEEDS Skin   Limited Assist Verbally Other (Comment) (Wound/Incision LDAs,Incision closed,vertebral,column,L)                       Personal Care Assistance  Level of Assistance  Bathing, Feeding, Dressing Bathing Assistance: Maximum assistance Feeding assistance: Independent (can feed self) Dressing Assistance: Maximum assistance     Functional Limitations Info  Sight, Hearing, Speech Sight Info: Impaired Hearing Info: Adequate Speech Info: Adequate    SPECIAL CARE FACTORS FREQUENCY  PT (By licensed PT), OT (By licensed OT)     PT Frequency: 5x min weekly OT Frequency: 5x min weekly            Contractures Contractures Info: Not present    Additional Factors Info  Code Status, Allergies, Psychotropic Code Status Info: FULL Allergies Info: No Known Allergies Psychotropic Info: divalproex (DEPAKOTE ER) 24 hr tablet 500 mg daily at bedtime         Current Medications (07/13/2022):  This is the current hospital active medication list Current Facility-Administered Medications  Medication Dose Route Frequency Provider Last Rate Last Admin   0.9 %  sodium chloride infusion   Intravenous Continuous [name], MD 10 mL/hr at 07/12/22 0700 Rate Change at 07/12/22 0700   acetaminophen (TYLENOL) tablet 1,000 mg  1,000 mg Oral TID Eulogio Bear U, DO   1,000 mg at 07/13/22 0911   amLODipine (NORVASC) tablet 10 mg  10 mg Oral Daily Karmen Bongo, MD   10 mg at 07/13/22 0911   aspirin chewable tablet 81 mg  81 mg Oral Daily Karmen Bongo, MD   81 mg at 07/13/22 0911   bisacodyl (DULCOLAX) EC tablet 5 mg  5 mg Oral Daily PRN Lorin Mercy,  Anderson Malta, MD   5 mg at 07/09/22 0913   cefTRIAXone (ROCEPHIN) 2 g in sodium chloride 0.9 % 100 mL IVPB  2 g Intravenous Q24H Tommy Medal, Lavell Islam, MD 200 mL/hr at 07/12/22 1742 2 g at 07/12/22 1742   Chlorhexidine Gluconate Cloth 2 % PADS 6 each  6 each Topical Daily [name], MD   6 each at 07/13/22 1323   DAPTOmycin (CUBICIN) 650 mg in sodium chloride 0.9 % IVPB  8 mg/kg Intravenous Q2000 Tommy Medal, Lavell Islam, MD 126 mL/hr at 07/12/22 1951 650 mg at 07/12/22 1951   divalproex (DEPAKOTE ER) 24 hr  tablet 500 mg  500 mg Oral Ivery Quale, MD   500 mg at 07/12/22 2221   enoxaparin (LOVENOX) injection 40 mg  40 mg Subcutaneous Q24H [name], MD   40 mg at 07/12/22 1424   finasteride (PROSCAR) tablet 5 mg  5 mg Oral Daily Karmen Bongo, MD   5 mg at 07/13/22 3557   hydrALAZINE (APRESOLINE) injection 5 mg  5 mg Intravenous Q4H PRN Karmen Bongo, MD       [START ON 07/14/2022] influenza vaccine adjuvanted (FLUAD) injection 0.5 mL  0.5 mL Intramuscular Tomorrow-1000 [place], Marge Duncans, MD       latanoprost (XALATAN) 0.005 % ophthalmic solution 1 drop  1 drop Both Eyes QHS Karmen Bongo, MD   1 drop at 07/12/22 2221   lidocaine (LIDODERM) 5 % 1 patch  1 patch Transdermal Q24H Karmen Bongo, MD   1 patch at 07/12/22 2221   memantine (NAMENDA) tablet 10 mg  10 mg Oral BID Karmen Bongo, MD   10 mg at 07/13/22 0911   mirabegron ER (MYRBETRIQ) tablet 25 mg  25 mg Oral Daily Karmen Bongo, MD   25 mg at 07/13/22 0912   morphine (PF) 2 MG/ML injection 2 mg  2 mg Intravenous Q2H PRN Karmen Bongo, MD   2 mg at 07/10/22 2219   ondansetron (ZOFRAN) tablet 4 mg  4 mg Oral Q6H PRN Karmen Bongo, MD       Or   ondansetron [facility]) injection 4 mg  4 mg Intravenous Q6H PRN Karmen Bongo, MD       Oral care mouth rinse  15 mL Mouth Rinse PRN Raenette Rover, NP       oxyCODONE (Oxy IR/ROXICODONE) immediate release tablet 5 mg  5 mg Oral Q4H PRN Karmen Bongo, MD   5 mg at 07/12/22 1248   [START ON 07/14/2022] pneumococcal 20-valent conjugate vaccine (PREVNAR 20) injection 0.5 mL  0.5 mL Intramuscular Tomorrow-1000 [place], Marge Duncans, MD       polyethylene glycol (MIRALAX / GLYCOLAX) packet 17 g  17 g Oral BID Vann, Jessica U, DO   17 g at 07/13/22 0912   rosuvastatin (CRESTOR) tablet 20 mg  20 mg Oral Daily Karmen Bongo, MD   20 mg at 07/13/22 0911   senna-docusate (Senokot-S) tablet 1 tablet  1 tablet Oral BID Karmen Bongo, MD   1 tablet at 07/13/22 0911   sodium chloride flush (NS) 0.9  % injection 10-40 mL  10-40 mL Intracatheter Q12H [name], MD   10 mL at 07/13/22 1323   sodium chloride flush (NS) 0.9 % injection 10-40 mL  10-40 mL Intracatheter PRN [name], MD       sodium zirconium cyclosilicate (LOKELMA) packet 5 g  5 g Oral Once [name], MD       tamsulosin [facility]) capsule  0.4 mg  0.4 mg Oral Daily Karmen Bongo, MD   0.4 mg at 07/13/22 0911   traMADol (ULTRAM) tablet 50 mg  50 mg Oral Q8H PRN Karmen Bongo, MD   50 mg at 07/09/22 1247     Discharge Medications: Please see discharge summary for a list of discharge medications.  Relevant Imaging Results:  Relevant Lab Results:   Additional Information [id number]  Milas Gain, LCSWA

## 2022-07-13 NOTE — TOC Initial Note (Signed)
Transition of Care (TOC) - Initial/Assessment Note  Marvetta Gibbons RN,BSN Transitions of Care Unit 4NP (Non Trauma)- RN Case Manager See Treatment Team for direct Phone #   Patient Details  Name: Eric Barrett. MRN: [id number] Date of Birth: 10-10-1940  Transition of Care [facility]) CM/SW Contact:    Dawayne Patricia, RN Phone Number: 07/13/2022, 2:48 PM  Clinical Narrative:                 Pt from home w/ wife- recent rehab stay at [facility] 05/24/22-06/02/22. Noted pt will need IV abx per ID- 6wks. PICC line has been placed.  ID referral to [facility]- liaison Pam following for any home abx needs.   TOC has been trying to reach wife by phone without success- staff notified TOC that wife is present at the bedside this afternoon.   CM went to room to speak with wife regarding transition plan/needs. Per conversation with wife she voiced understanding that pt will need 6wks of IV abx and PICC line is in place. Discussed with wife plan for home w/ HH vs return to SNF. Wife voiced she feels return to SNF would be the safest for pt at this time. Wife expressed that she knows their options are limited with [employer]'s narrow contracted facilities in the area. She also expressed last time she had the choice between [facility] and [facility] which she selected [facility] because it was 30 min closer to home and she doesn't drive.  She voiced she understood she would have a copay as well- but wants to have TOC re-fax out to see what options they may have this time and is hopeful for something close to home- but willing to look at all options in the best interest of the patient.   Wife gave verbal permission to fax out SNF referral to [facility] and surrounding area for [employer] facilities. CSW updated and will start SNF workup.   Wife provided TOC contact numbers as well.   Expected Discharge Plan: Skilled Nursing Facility Barriers to Discharge: Continued Medical Work  up   Patient Goals and CMS Choice Patient states their goals for this hospitalization and ongoing recovery are:: SNF for IV abx needs CMS Medicare.gov Compare Post Acute Care list provided to:: Patient Represenative (must comment) (spouse) Choice offered to / list presented to : Spouse  Expected Discharge Plan and Services Expected Discharge Plan: [place] In-house Referral: Clinical Social Work Discharge Planning Services: CM Consult Post Acute Care Choice: [place] arrangements for the past 2 months: [place]                                      Prior Living Arrangements/Services Living arrangements for the past 2 months: Single Family Home Lives with:: Spouse Patient language and need for interpreter reviewed:: Yes Do you feel safe going back to the place where you live?: No   feel pt would be better at SNF for IV abx needs  Need for Family Participation in Patient Care: Yes (Comment) Care giver support system in place?: Yes (comment) Current home services: DME (RW) Criminal Activity/Legal Involvement Pertinent to Current Situation/Hospitalization: No - Comment as needed  Activities of Daily Living Home Assistive Devices/Equipment: Gilford Rile (specify type) ADL Screening (condition at time of admission) Patient's cognitive ability adequate to safely complete daily activities?: Yes Is the patient deaf or have  difficulty hearing?: No Does the patient have difficulty seeing, even when wearing glasses/contacts?: No Does the patient have difficulty concentrating, remembering, or making decisions?: Yes Patient able to express need for assistance with ADLs?: Yes Does the patient have difficulty dressing or bathing?: No Independently performs ADLs?: Yes (appropriate for developmental age) Does the patient have difficulty walking or climbing stairs?: Yes Weakness of Legs: Left Weakness of Arms/Hands: Left  Permission  Sought/Granted Permission sought to share information with : Facility Art therapist granted to share information with : Yes, Verbal Permission Granted     Permission granted to share info w AGENCY: SNF        Emotional Assessment Appearance:: Appears stated age Attitude/Demeanor/Rapport: Gracious Affect (typically observed): Pleasant Orientation: : Oriented to Self, Oriented to Place Alcohol / Substance Use: Not Applicable Psych Involvement: No (comment)  Admission diagnosis:  Lactic acidosis [E87.20] 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HCC) [M46.26] Midline low back pain without sciatica, unspecified chronicity [M54.50] Patient Active Problem List   Diagnosis Date Noted   Glaucoma 07/07/2022   Hypertension 07/07/2022   HLD (hyperlipidemia) 07/07/2022   Chronic diastolic (congestive) heart failure ([place]) 07/07/2022   BPH (benign prostatic hyperplasia) 07/07/2022   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[place]) 07/06/2022   Change in bowel habits    Constipation    Abnormal CT scan, colon    Benign neoplasm of sigmoid colon    Hyponatremia 05/15/2022   Alteration consciousness 01/19/2022   Dementia ([place]) 01/19/2022   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[place]) 08/02/2021   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 ([place]) 08/02/2021   Memory disturbance 01/27/2021   History of stroke 11/27/2019   Gait abnormality 11/27/2019   PCP:  [name], FNP Pharmacy:   [facility] [place], [place] [address] [place] [address] Phone: [phone] Fax: [phone]     Social Determinants of Health ([place]) Interventions    Readmission Risk Interventions    05/17/2022    4:04 PM  Readmission Risk Prevention Plan  Transportation Screening Complete  PCP or Specialist Appt within 5-7 Days Complete  Home Care Screening Complete  Medication Review (RN CM) Complete

## 2022-07-13 NOTE — Progress Notes (Incomplete)
PHARMACY CONSULT NOTE FOR:  OUTPATIENT  PARENTERAL ANTIBIOTIC THERAPY (OPAT)  Indication:  Regimen:  End date:   IV antibiotic discharge orders are pended. To discharging provider:  please sign these orders via discharge navigator,  Select New Orders & click on the button choice - Manage This Unsigned Work.     Thank you for allowing pharmacy to be a part of this patient's care.  Lawson Radar 07/13/2022, 7:41 AM

## 2022-07-13 NOTE — Progress Notes (Signed)
Mobility Specialist Progress Note   07/13/22 1514  Mobility  Activity Transferred from bed to chair (+ 2x5 [place])  Information systems manager Ambulated (ft) 2 ft  Activity Response Tolerated well  $Mobility charge 1 Mobility   Pre Mobility: HR, BP, 96% SpO2 on RA  During Mobility: HR, 131 BP, 91% SpO2 on RA  Post Mobility: HR, 116/66 BP, 95% SpO2 on RA  Received in bed slightly agitated and requiring encouragement to mobilize. Stand by assist to EOB but MinA to stand d/t general weakness + tremors in BLE. Requiring moderate cues of sequencing d/t decreased attention. No faults during transfer. Once at the chair, 2x5 marches w/ one break in between. Pt progressing in stability over time of session. Left in chair w/ call bell in reach, chair alarm on and wife in room.  [place] Falling Mobility Specialist Please contact via SecureChat or  Rehab office at [phone]

## 2022-07-13 NOTE — Progress Notes (Signed)
Peripherally Inserted Central Catheter Placement  The IV Nurse has discussed with the patient and/or persons authorized to consent for the patient, the purpose of this procedure and the potential benefits and risks involved with this procedure.  The benefits include less needle sticks, lab draws from the catheter, and the patient may be discharged home with the catheter. Risks include, but not limited to, infection, bleeding, blood clot (thrombus formation), and puncture of an artery; nerve damage and irregular heartbeat and possibility to perform a PICC exchange if needed/ordered by physician.  Alternatives to this procedure were also discussed.  Bard Power PICC patient education guide, fact sheet on infection prevention and patient information card has been provided to patient /or left at bedside.    PICC Placement Documentation  PICC Single Lumen 07/13/22 Right Brachial 40 cm 0 cm (Active)  Indication for Insertion or Continuance of Line Home intravenous therapies (PICC only) 07/13/22 1138  Exposed Catheter (cm) 0 cm 07/13/22 1138  Site Assessment Clean, Dry, Intact 07/13/22 1138  Line Status Flushed;Saline locked;Blood return noted 07/13/22 1138  Dressing Type Transparent;Securing device 07/13/22 1138  Dressing Status Antimicrobial disc in place 07/13/22 1138  Safety Lock Not Applicable 64/68/03 2122  Dressing Intervention New dressing 07/13/22 1138  Dressing Change Due 07/20/22 07/13/22 1138       Frances Maywood 07/13/2022, 11:43 AM

## 2022-07-13 NOTE — Progress Notes (Signed)
Subjective:  No new complaints   Antibiotics:  Anti-infectives (From admission, onward)    Start     Dose/Rate Route Frequency Ordered Stop   07/10/22 2000  DAPTOmycin (CUBICIN) 650 mg in sodium chloride 0.9 % IVPB        8 mg/kg  82.6 kg 126 mL/hr over 30 Minutes Intravenous Daily 07/10/22 1654     07/10/22 1800  cefTRIAXone (ROCEPHIN) 2 g in sodium chloride 0.9 % 100 mL IVPB        2 g 200 mL/hr over 30 Minutes Intravenous Every 24 hours 07/10/22 1654     07/07/22 2300  vancomycin (VANCOREADY) IVPB 1250 mg/250 mL  Status:  Discontinued        1,250 mg 166.7 mL/hr over 90 Minutes Intravenous Every 24 hours 07/06/22 2312 07/07/22 1150   07/07/22 0900  ceFEPIme (MAXIPIME) 2 g in sodium chloride 0.9 % 100 mL IVPB  Status:  Discontinued        2 g 200 mL/hr over 30 Minutes Intravenous Every 12 hours 07/06/22 2330 07/07/22 1150   07/06/22 2315  vancomycin (VANCOCIN) IVPB 1000 mg/200 mL premix  Status:  Discontinued        1,000 mg 200 mL/hr over 60 Minutes Intravenous  Once 07/06/22 2301 07/06/22 2303   07/06/22 2315  vancomycin (VANCOCIN) IVPB 1000 mg/200 mL premix        1,000 mg 200 mL/hr over 60 Minutes Intravenous Every hour 07/06/22 2303 07/07/22 0132   07/06/22 2100  ceFEPIme (MAXIPIME) 2 g in sodium chloride 0.9 % 100 mL IVPB        2 g 200 mL/hr over 30 Minutes Intravenous  Once 07/06/22 2059 07/06/22 2153       Medications: Scheduled Meds:  acetaminophen  1,000 mg Oral TID   amLODipine  10 mg Oral Daily   aspirin  81 mg Oral Daily   Chlorhexidine Gluconate Cloth  6 each Topical Daily   divalproex  500 mg Oral QHS   enoxaparin (LOVENOX) injection  40 mg Subcutaneous Q24H   finasteride  5 mg Oral Daily   [START ON 07/14/2022] influenza vaccine adjuvanted  0.5 mL Intramuscular Tomorrow-1000   latanoprost  1 drop Both Eyes QHS   lidocaine  1 patch Transdermal Q24H   memantine  10 mg Oral BID   mirabegron ER  25 mg Oral Daily   [START ON 07/14/2022]  pneumococcal 20-valent conjugate vaccine  0.5 mL Intramuscular Tomorrow-1000   polyethylene glycol  17 g Oral BID   rosuvastatin  20 mg Oral Daily   senna-docusate  1 tablet Oral BID   sodium chloride flush  10-40 mL Intracatheter Q12H   tamsulosin  0.4 mg Oral Daily   Continuous Infusions:  sodium chloride 10 mL/hr at 07/12/22 0700   cefTRIAXone (ROCEPHIN)  IV 2 g (07/12/22 1742)   DAPTOmycin (CUBICIN) 650 mg in sodium chloride 0.9 % IVPB 650 mg (07/12/22 1951)   PRN Meds:.bisacodyl, hydrALAZINE, morphine injection, ondansetron **OR** ondansetron (ZOFRAN) IV, mouth rinse, oxyCODONE, sodium chloride flush, traMADol    Objective: Weight change:   Intake/Output Summary (Last 24 hours) at 07/13/2022 1236 Last data filed at 07/13/2022 0657 Gross per 24 hour  Intake 798.6 ml  Output 800 ml  Net -1.4 ml    Blood pressure 123/66, pulse 74, temperature 97.6 F (36.4 C), temperature source Oral, resp. rate 19, height '5\' 7"'$  (1.702 m), weight 82.6 kg, SpO2 99 %. Temp:  [97.6 F (36.4  C)-98.8 F (37.1 C)] 97.6 F (36.4 C) (12/07 1053) Pulse Rate:  [62-74] 74 (12/07 1053) Resp:  [13-21] 19 (12/07 1053) BP: (113-126)/(60-70) 123/66 (12/07 1053) SpO2:  [98 %-100 %] 99 % (12/07 1053)  Physical Exam: Physical Exam Constitutional:      Appearance: He is well-developed.  HENT:     Head: Normocephalic and atraumatic.  Eyes:     Conjunctiva/sclera: Conjunctivae normal.  Cardiovascular:     Rate and Rhythm: Normal rate and regular rhythm.  Pulmonary:     Effort: Pulmonary effort is normal. No respiratory distress.     Breath sounds: Normal breath sounds. No stridor. No wheezing.  Abdominal:     General: There is no distension.     Palpations: Abdomen is soft.  Musculoskeletal:        General: Normal range of motion.     Cervical back: Normal range of motion and neck supple.  Skin:    General: Skin is warm and dry.     Findings: No erythema or rash.  Neurological:     General: No  focal deficit present.     Mental Status: He is alert and oriented to person, place, and time.  Psychiatric:        Mood and Affect: Mood normal.        Behavior: Behavior normal.        Thought Content: Thought content normal.        Judgment: Judgment normal.      CBC:    BMET Recent Labs    07/12/22 0612 07/13/22 0432  NA 127* 128*  K 4.6 5.2*  CL 95* 93*  CO2 26 27  GLUCOSE 110* 104*  BUN 9 10  CREATININE 1.01 1.10  CALCIUM 8.6* 8.6*      Liver Panel  Recent Labs    07/13/22 0432  PROT 6.1*  ALBUMIN 2.4*  AST 16  ALT 10  ALKPHOS 47  BILITOT 0.3       Sedimentation Rate No results for input(s): "ESRSEDRATE" in the last 72 hours. C-Reactive Protein No results for input(s): "CRP" in the last 72 hours.  Micro Results: Recent Results (from the past 720 hour(s))  Blood Culture (routine x 2)     Status: None   Collection Time: 07/06/22  8:10 PM   Specimen: BLOOD  Result Value Ref Range Status   Specimen Description   Final    BLOOD LEFT ANTECUBITAL Performed at [facility], [address], [place], [place] [address]    Special Requests   Final    Blood Culture adequate volume BOTTLES DRAWN AEROBIC AND ANAEROBIC Performed at [facility], [address] [place], [place] [address]    Culture   Final    NO GROWTH 5 DAYS Performed at [facility], [place] [address] [place], [place] [address]    Report Status 07/11/2022 FINAL  Final  Urine Culture     Status: None   Collection Time: 07/06/22  9:56 PM   Specimen: In/Out Cath Urine  Result Value Ref Range Status   Specimen Description   Final    IN/OUT CATH URINE Performed at [facility], [address], [place], [place] [address]    Special Requests   Final    NONE Performed at [facility], [address] [place], [place] [address]    Culture   Final    NO GROWTH Performed at [facility],  [place] [address],  [place], [place] [address]    Report Status 07/08/2022 FINAL  Final  Blood Culture (routine x 2)     Status: None   Collection Time: 07/07/22  4:38 AM   Specimen: BLOOD  Result Value Ref Range Status   Specimen Description BLOOD RIGHT ANTECUBITAL  Final   Special Requests   Final    BOTTLES DRAWN AEROBIC AND ANAEROBIC Blood Culture adequate volume   Culture   Final    NO GROWTH 5 DAYS Performed at [facility], [place] [address], [place], [place] [address]    Report Status 07/12/2022 FINAL  Final  MRSA Next Gen by PCR, Nasal     Status: None   Collection Time: 07/10/22  7:25 AM   Specimen: Nasal Mucosa; Nasal Swab  Result Value Ref Range Status   MRSA by PCR Next Gen NOT DETECTED NOT DETECTED Final    Comment: (NOTE) The GeneXpert MRSA Assay (FDA approved for NASAL specimens only), is one component of a comprehensive MRSA colonization surveillance program. It is not intended to diagnose MRSA infection nor to guide or monitor treatment for MRSA infections. Test performance is not FDA approved in patients less than 45 years old. Performed at [facility], [place] [name], [place], [place] [address]   Aerobic/Anaerobic Culture w Gram Stain (surgical/deep wound)     Status: None (Preliminary result)   Collection Time: 07/10/22  3:44 PM   Specimen: Abscess  Result Value Ref Range Status   Specimen Description ABSCESS  Final   Special Requests LUMBAR DISC ASPIRATION  Final   Gram Stain   Final    RARE WBC PRESENT,BOTH PMN AND MONONUCLEAR NO ORGANISMS SEEN Performed at [facility], [place] [address], [place], [place] [address]    Culture   Final    NORMAL SKIN FLORA NO ANAEROBES ISOLATED; CULTURE IN PROGRESS FOR 5 DAYS    Report Status PENDING  Incomplete    Studies/Results: [place] EKG SITE RITE  Result Date: 07/13/2022 If Site Rite image not attached, placement could not be confirmed due to current cardiac rhythm.     Assessment/Plan:  INTERVAL  HISTORY:   We have called microbiology and there may be a specific organism growing rather than "just mixed flora  Principal Problem:   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HCC) Active Problems:   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HCC)   Glaucoma   Hypertension   HLD (hyperlipidemia)   Chronic diastolic (congestive) heart failure (HCC)   BPH (benign prostatic hyperplasia)    Eric Barrett. is a 81 y.o. male with history of dementia, heart failure, mated with findings of osteomyelitisi on lumbosacral region. Despite protocol to NOT give systemic antibiotics to patients such as him (unless they are septic, bacteremic) he was inappropriately given vancomycin and ceftriaxone in ER, then antibiotics held several days leading to a more protracted hospitalization.   #1   Discitis:  Will follow-up cultures  I have placed order for PICC line.  In the interim we will continue ceftriaxone and daptomycin  I spent 36 minutes with the patient including than 50% of the time in face to face counseling of the patient re his discitis along with review of medical records in preparation for the visit and during the visit and in coordination of his care.         LOS: 6 days   Eric Barrett 07/13/2022, 12:36 PM

## 2022-07-13 NOTE — Progress Notes (Signed)
[facility]  PROGRESS NOTE  Eric Barrett. XNA:[id number] DOB: Jul 24, 1941 DOA: 07/06/2022 PCP: [name], FNP   Brief HPI:   81 year old male with medical history of chronic diastolic CHF, glucoma, dementia, CVA with left hemiparesis, hypertension, hyperlipidemia presents with back pain.  MRI showed osteomyelitis of the spine.  He was hospitalized from 10/9 to 10/18 with acute on chronic hyponatremia and MRSE UTI.  He went to rehab and returned home on 11/27.  Patient had trouble walking at home due to severe back pain.  Patient was complaining of pain in the back while in the rehab.  He was using walker and then wheelchair.  Had MRI of the lumbar spine 2 days ago at [facility].  PCP got result and told him to go to the hospital for infection, osteomyelitis at L5-S1.  Patient went to [facility].    Subjective   Patient seen and examined, denies pain.   Assessment/Plan:    Lumbar osteomyelitis -Patient presented with worsening low back pain, MRI reportedly was diagnostic for osteomyelitis -imaging was performed at [facility], no information available in Care Everywhere -He was recently mated and was diagnosed with UTI from oxacillin resistant Staph epidermidis -Patient's wife brought MRI report on disc, was given to radiology in our hospital -Patient CRP and sed rate are normal -Neurosurgery consulted, no intervention recommended -IR consulted for aspiration -Biopsy done on 12/4 -IV antibiotics started per ID; continue ceftriaxone, daptomycin -Continue pain control with Lidoderm, oxycodone, morphine as needed -Blood cultures negative to date  Hyponatremia -Mild, likely SIADH -Serum osmolality 277, urine sodium 101 -TSH was 0.797 in october -Will start fluid restriction 1500 ML per day  Dementia -No behavior disturbance -Continue Depakote, Namenda  Hypertension -Continue amlodipine, losartan  Hyperlipidemia -Continue rosuvastatin  Hyperkalemia -Mild, will  hold losartan -Will give 1 dose of Lokelma 5 g p.o. -Follow BMP in am  Chronic diastolic CHF -Compensated at this time  BPH -Continue tamsulosin, Myrbetriq, finasteride  Glaucoma -Continue Travatan   Medications     acetaminophen  1,000 mg Oral TID   amLODipine  10 mg Oral Daily   aspirin  81 mg Oral Daily   divalproex  500 mg Oral QHS   enoxaparin (LOVENOX) injection  40 mg Subcutaneous Q24H   finasteride  5 mg Oral Daily   latanoprost  1 drop Both Eyes QHS   lidocaine  1 patch Transdermal Q24H   losartan  50 mg Oral Daily   memantine  10 mg Oral BID   mirabegron ER  25 mg Oral Daily   polyethylene glycol  17 g Oral BID   rosuvastatin  20 mg Oral Daily   senna-docusate  1 tablet Oral BID   tamsulosin  0.4 mg Oral Daily     Data Reviewed:   CBG:  Recent Labs  Lab 07/12/22 1733  GLUCAP 136*    SpO2: 100 % O2 Flow Rate (L/min): 2 L/min    Vitals:   07/12/22 1931 07/12/22 2305 07/13/22 0253 07/13/22 0710  BP: 113/60 126/70 125/69 120/64  Pulse: 66 66 67 62  Resp: '20 18 16 13  '$ Temp: 97.6 F (36.4 C) 97.8 F (36.6 C) 98 F (36.7 C) 98.8 F (37.1 C)  TempSrc: Oral Oral Axillary Oral  SpO2: 98% 99% 99% 100%  Weight:      Height:          Data Reviewed:  Basic Metabolic Panel: Recent Labs  Lab 07/09/22 0311 07/10/22 0435 07/11/22 0456 07/12/22  0612 07/13/22 0432  NA 130* 127* 128* 127* 128*  K 4.5 4.7 4.8 4.6 5.2*  CL 90* 92* 96* 95* 93*  CO2 '30 26 26 26 27  '$ GLUCOSE 107* 110* 99 110* 104*  BUN '14 14 10 9 10  '$ CREATININE 1.25* 1.14 0.99 1.01 1.10  CALCIUM 9.0 8.5* 8.5* 8.6* 8.6*    CBC: Recent Labs  Lab 07/06/22 2010 07/07/22 0438 07/09/22 0311 07/10/22 0435 07/11/22 0456 07/12/22 0612 07/13/22 0432  WBC 6.8   < > 7.2 6.3 6.6 7.2 5.7  NEUTROABS 4.6  --   --   --   --   --   --   HGB 13.4   < > 11.1* 10.9* 10.5* 11.6* 11.0*  HCT 40.1   < > 33.3* 31.1* 31.0* 32.7* 31.4*  MCV 81.7   < > 81.0 79.1* 80.1 78.0* 79.5*  PLT 359   < >  342 284 297 289 275   < > = values in this interval not displayed.    LFT Recent Labs  Lab 07/06/22 2010 07/13/22 0432  AST 19 16  ALT 11 10  ALKPHOS 68 47  BILITOT 0.4 0.3  PROT 8.5* 6.1*  ALBUMIN 4.2 2.4*     Antibiotics: Anti-infectives (From admission, onward)    Start     Dose/Rate Route Frequency Ordered Stop   07/10/22 2000  DAPTOmycin (CUBICIN) 650 mg in sodium chloride 0.9 % IVPB        8 mg/kg  82.6 kg 126 mL/hr over 30 Minutes Intravenous Daily 07/10/22 1654     07/10/22 1800  cefTRIAXone (ROCEPHIN) 2 g in sodium chloride 0.9 % 100 mL IVPB        2 g 200 mL/hr over 30 Minutes Intravenous Every 24 hours 07/10/22 1654     07/07/22 2300  vancomycin (VANCOREADY) IVPB 1250 mg/250 mL  Status:  Discontinued        1,250 mg 166.7 mL/hr over 90 Minutes Intravenous Every 24 hours 07/06/22 2312 07/07/22 1150   07/07/22 0900  ceFEPIme (MAXIPIME) 2 g in sodium chloride 0.9 % 100 mL IVPB  Status:  Discontinued        2 g 200 mL/hr over 30 Minutes Intravenous Every 12 hours 07/06/22 2330 07/07/22 1150   07/06/22 2315  vancomycin (VANCOCIN) IVPB 1000 mg/200 mL premix  Status:  Discontinued        1,000 mg 200 mL/hr over 60 Minutes Intravenous  Once 07/06/22 2301 07/06/22 2303   07/06/22 2315  vancomycin (VANCOCIN) IVPB 1000 mg/200 mL premix        1,000 mg 200 mL/hr over 60 Minutes Intravenous Every hour 07/06/22 2303 07/07/22 0132   07/06/22 2100  ceFEPIme (MAXIPIME) 2 g in sodium chloride 0.9 % 100 mL IVPB        2 g 200 mL/hr over 30 Minutes Intravenous  Once 07/06/22 2059 07/06/22 2153        DVT prophylaxis: Lovenox  Code Status: Full code  Family Communication: No family at bedside   CONSULTS infectious disease   Objective    Physical Examination:   General-appears in no acute distress Heart-S1-S2, regular, no murmur auscultated Lungs-clear to auscultation bilaterally, no wheezing or crackles auscultated Abdomen-soft, nontender, no  organomegaly Extremities-no edema in the lower extremities Neuro-alert, oriented x3, no focal deficit noted   Status is: Inpatient:        [name]   [facility] If 7PM-7AM, please contact night-coverage at [email or web address], Office  [phone]  07/13/2022, 8:31 AM  LOS: 6 days

## 2022-07-14 DIAGNOSIS — M4647 Discitis, unspecified, lumbosacral region: Secondary | ICD-10-CM | POA: Diagnosis not present

## 2022-07-14 DIAGNOSIS — G301 Alzheimer's disease with late onset: Secondary | ICD-10-CM | POA: Diagnosis not present

## 2022-07-14 DIAGNOSIS — F02A0 Dementia in other diseases classified elsewhere, mild, without behavioral disturbance, psychotic disturbance, mood disturbance, and anxiety: Secondary | ICD-10-CM | POA: Diagnosis not present

## 2022-07-14 DIAGNOSIS — M4626 Osteomyelitis of vertebra, lumbar region: Secondary | ICD-10-CM | POA: Diagnosis not present

## 2022-07-14 DIAGNOSIS — F03B18 Unspecified dementia, moderate, with other behavioral disturbance: Secondary | ICD-10-CM | POA: Diagnosis not present

## 2022-07-14 LAB — COMPREHENSIVE METABOLIC PANEL
ALT: 11 U/L (ref 0–44)
AST: 18 U/L (ref 15–41)
Albumin: 2.6 g/dL — ABNORMAL LOW (ref 3.5–5.0)
Alkaline Phosphatase: 49 U/L (ref 38–126)
Anion gap: 9 (ref 5–15)
BUN: 14 mg/dL (ref 8–23)
CO2: 26 mmol/L (ref 22–32)
Calcium: 8.7 mg/dL — ABNORMAL LOW (ref 8.9–10.3)
Chloride: 93 mmol/L — ABNORMAL LOW (ref 98–111)
Creatinine, Ser: 1.13 mg/dL (ref 0.61–1.24)
GFR, Estimated: >60 mL/min (ref >60)
Glucose, Bld: 95 mg/dL (ref 70–99)
Potassium: 4.7 mmol/L (ref 3.5–5.1)
Sodium: 128 mmol/L — ABNORMAL LOW (ref 135–145)
Total Bilirubin: 0.3 mg/dL (ref 0.3–1.2)
Total Protein: 6.3 g/dL — ABNORMAL LOW (ref 6.5–8.1)

## 2022-07-14 NOTE — Progress Notes (Signed)
PHARMACY CONSULT NOTE FOR:  OUTPATIENT  PARENTERAL ANTIBIOTIC THERAPY (OPAT)  Indication: discitis/osteomyelitis Regimen: Daptomycin '650mg'$  IV q24h End date: 08/20/2022  IV antibiotic discharge orders are pended. To discharging provider:  please sign these orders via discharge navigator,  Select New Orders & click on the button choice - Manage This Unsigned Work.     Arturo Morton, PharmD, BCPS Please check AMION for all [place] contact numbers Clinical Pharmacist 07/14/2022 3:07 PM

## 2022-07-14 NOTE — Progress Notes (Signed)
Subjective:  No new complaints    Antibiotics:  Anti-infectives (From admission, onward)    Start     Dose/Rate Route Frequency Ordered Stop   07/10/22 2000  DAPTOmycin (CUBICIN) 650 mg in sodium chloride 0.9 % IVPB        8 mg/kg  82.6 kg 126 mL/hr over 30 Minutes Intravenous Daily 07/10/22 1654     07/10/22 1800  cefTRIAXone (ROCEPHIN) 2 g in sodium chloride 0.9 % 100 mL IVPB  Status:  Discontinued        2 g 200 mL/hr over 30 Minutes Intravenous Every 24 hours 07/10/22 1654 07/14/22 1256   07/07/22 2300  vancomycin (VANCOREADY) IVPB 1250 mg/250 mL  Status:  Discontinued        1,250 mg 166.7 mL/hr over 90 Minutes Intravenous Every 24 hours 07/06/22 2312 07/07/22 1150   07/07/22 0900  ceFEPIme (MAXIPIME) 2 g in sodium chloride 0.9 % 100 mL IVPB  Status:  Discontinued        2 g 200 mL/hr over 30 Minutes Intravenous Every 12 hours 07/06/22 2330 07/07/22 1150   07/06/22 2315  vancomycin (VANCOCIN) IVPB 1000 mg/200 mL premix  Status:  Discontinued        1,000 mg 200 mL/hr over 60 Minutes Intravenous  Once 07/06/22 2301 07/06/22 2303   07/06/22 2315  vancomycin (VANCOCIN) IVPB 1000 mg/200 mL premix        1,000 mg 200 mL/hr over 60 Minutes Intravenous Every hour 07/06/22 2303 07/07/22 0132   07/06/22 2100  ceFEPIme (MAXIPIME) 2 g in sodium chloride 0.9 % 100 mL IVPB        2 g 200 mL/hr over 30 Minutes Intravenous  Once 07/06/22 2059 07/06/22 2153       Medications: Scheduled Meds:  acetaminophen  1,000 mg Oral TID   amLODipine  10 mg Oral Daily   aspirin  81 mg Oral Daily   Chlorhexidine Gluconate Cloth  6 each Topical Daily   divalproex  500 mg Oral QHS   enoxaparin (LOVENOX) injection  40 mg Subcutaneous Q24H   finasteride  5 mg Oral Daily   influenza vaccine adjuvanted  0.5 mL Intramuscular Tomorrow-1000   latanoprost  1 drop Both Eyes QHS   lidocaine  1 patch Transdermal Q24H   memantine  10 mg Oral BID   mirabegron ER  25 mg Oral Daily    pneumococcal 20-valent conjugate vaccine  0.5 mL Intramuscular Tomorrow-1000   polyethylene glycol  17 g Oral BID   rosuvastatin  20 mg Oral Daily   senna-docusate  1 tablet Oral BID   sodium chloride flush  10-40 mL Intracatheter Q12H   tamsulosin  0.4 mg Oral Daily   Continuous Infusions:  sodium chloride 10 mL/hr at 07/12/22 0700   DAPTOmycin (CUBICIN) 650 mg in sodium chloride 0.9 % IVPB 650 mg (07/13/22 1956)   PRN Meds:.bisacodyl, hydrALAZINE, morphine injection, ondansetron **OR** ondansetron (ZOFRAN) IV, mouth rinse, oxyCODONE, sodium chloride flush, traMADol    Objective: Weight change:   Intake/Output Summary (Last 24 hours) at 07/14/2022 1517 Last data filed at 07/14/2022 0918 Gross per 24 hour  Intake 1160 ml  Output 1700 ml  Net -540 ml    Blood pressure 113/63, pulse 69, temperature 98.4 F (36.9 C), temperature source Oral, resp. rate 18, height _0  (1.702 m), weight 82.6 kg, SpO2 96 %. Temp:  [97.8 F (36.6 C)-98.4 F (36.9 C)] 98.4 F (36.9 C) (12/08 1200) Pulse  Rate:  [64-74] 69 (12/08 1200) Resp:  [16-19] 18 (12/08 1200) BP: (105-148)/(62-77) 113/63 (12/08 1200) SpO2:  [96 %-99 %] 96 % (12/08 1200)  Physical Exam: Physical Exam Constitutional:      Appearance: He is well-developed.  HENT:     Head: Normocephalic and atraumatic.  Eyes:     Conjunctiva/sclera: Conjunctivae normal.  Cardiovascular:     Rate and Rhythm: Normal rate and regular rhythm.  Pulmonary:     Effort: Pulmonary effort is normal. No respiratory distress.     Breath sounds: Normal breath sounds. No stridor. No wheezing.  Abdominal:     General: There is no distension.     Palpations: Abdomen is soft.  Musculoskeletal:        General: Normal range of motion.     Cervical back: Normal range of motion and neck supple.  Skin:    General: Skin is warm and dry.     Findings: No erythema or rash.  Neurological:     General: No focal deficit present.     Mental Status: He is  alert and oriented to person, place, and time.  Psychiatric:        Mood and Affect: Mood normal.        Behavior: Behavior normal.        Thought Content: Thought content normal.        Judgment: Judgment normal.      CBC:    BMET Recent Labs    07/13/22 0432 07/14/22 0510  NA 128* 128*  K 5.2* 4.7  CL 93* 93*  CO2 27 26  GLUCOSE 104* 95  BUN 10 14  CREATININE 1.10 1.13  CALCIUM 8.6* 8.7*      Liver Panel  Recent Labs    07/13/22 0432 07/14/22 0510  PROT 6.1* 6.3*  ALBUMIN 2.4* 2.6*  AST 16 18  ALT 10 11  ALKPHOS 47 49  BILITOT 0.3 0.3        Sedimentation Rate No results for input(s): "ESRSEDRATE" in the last 72 hours. C-Reactive Protein No results for input(s): "CRP" in the last 72 hours.  Micro Results: Recent Results (from the past 720 hour(s))  Blood Culture (routine x 2)     Status: None   Collection Time: 07/06/22  8:10 PM   Specimen: BLOOD  Result Value Ref Range Status   Specimen Description   Final    BLOOD LEFT ANTECUBITAL Performed at [facility], [address] [place], [place] [address]    Special Requests   Final    Blood Culture adequate volume BOTTLES DRAWN AEROBIC AND ANAEROBIC Performed at [facility], [address], [place], [place] [address]    Culture   Final    NO GROWTH 5 DAYS Performed at [facility], [place] [address], [place], [place] [address]    Report Status 07/11/2022 FINAL  Final  Urine Culture     Status: None   Collection Time: 07/06/22  9:56 PM   Specimen: In/Out Cath Urine  Result Value Ref Range Status   Specimen Description   Final    IN/OUT CATH URINE Performed at [facility], [address], [place], [place] [address]    Special Requests   Final    NONE Performed at [facility], [address], [place], [place] [address]    Culture   Final    NO GROWTH Performed at [facility], [place] [address], [place], [place]  [address]    Report Status 07/08/2022 FINAL  Final  Blood Culture (routine x 2)     Status: None   Collection Time: 07/07/22  4:38 AM   Specimen: BLOOD  Result Value Ref Range Status   Specimen Description BLOOD RIGHT ANTECUBITAL  Final   Special Requests   Final    BOTTLES DRAWN AEROBIC AND ANAEROBIC Blood Culture adequate volume   Culture   Final    NO GROWTH 5 DAYS Performed at [facility], [place] [address], [place], [place] [address]    Report Status 07/12/2022 FINAL  Final  MRSA Next Gen by PCR, Nasal     Status: None   Collection Time: 07/10/22  7:25 AM   Specimen: Nasal Mucosa; Nasal Swab  Result Value Ref Range Status   MRSA by PCR Next Gen NOT DETECTED NOT DETECTED Final    Comment: (NOTE) The GeneXpert MRSA Assay (FDA approved for NASAL specimens only), is one component of a comprehensive MRSA colonization surveillance program. It is not intended to diagnose MRSA infection nor to guide or monitor treatment for MRSA infections. Test performance is not FDA approved in patients less than 29 years old. Performed at [facility], [place] [address] [place], [place] [address]   Aerobic/Anaerobic Culture w Gram Stain (surgical/deep wound)     Status: None (Preliminary result)   Collection Time: 07/10/22  3:44 PM   Specimen: Abscess  Result Value Ref Range Status   Specimen Description ABSCESS  Final   Special Requests LUMBAR DISC ASPIRATION  Final   Gram Stain   Final    RARE WBC PRESENT,BOTH PMN AND MONONUCLEAR NO ORGANISMS SEEN Performed at [facility], [place] [address] [place], [place] [address]    Culture   Final    MODERATE STAPHYLOCOCCUS EPIDERMIDIS SUSCEPTIBILITIES TO FOLLOW NO ANAEROBES ISOLATED; CULTURE IN PROGRESS FOR 5 DAYS    Report Status PENDING  Incomplete    Studies/Results: [place] EKG SITE RITE  Result Date: 07/13/2022 If Site Rite image not attached, placement could not be confirmed due to current cardiac rhythm.      Assessment/Plan:  INTERVAL HISTORY:   Staphylococcus epidermidis is growing from culture without any other organisms.  Principal Problem:   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HCC) Active Problems:   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HCC)   Glaucoma   Hypertension   HLD (hyperlipidemia)   Chronic diastolic (congestive) heart failure (HCC)   BPH (benign prostatic hyperplasia)    Eric Barrett. is a 81 y.o. male with history of dementia, heart failure, mated with findings of osteomyelitisi on lumbosacral region. Despite Barrett to NOT give systemic antibiotics to patients such as him (unless they are septic, bacteremic) he was inappropriately given vancomycin and ceftriaxone in ER, then antibiotics held several days leading to a more protracted hospitalization.   #1 Discitis vertebral osteomyelitis to the lumbosacral area:  Culprit pathogen appears to be Staphylococcus epidermidis  Typically this organisms is methicillin resistant.  Unless it is methicillin sensitive we will plan on giving him daptomycin x 6 weeks.  Diagnosis: Discitis lumbosacral area with vertebral osteomyelitis  Culture Result: Staphylococcus epidermidis  No Known Allergies  OPAT Orders Discharge antibiotics to be given via PICC line Discharge antibiotics: Daptomycin 619m IV q24h  Duration: 6 weeks November End Date: 08/20/2022   [facility]:  Home health RN for IV administration and teaching; PICC line care and labs.    Labs weekly while on IV antibiotics: x__ CBC  with differential _x_ BMP  _x_ CRP _x_ ESR  x__ CK  _x_ Please pull PIC at completion of IV antibiotics __ Please leave PIC in place until doctor has seen patient or been notified  Fax weekly labs to [phone]  Clinic Follow Up Appt:   Eric Barrett. has an appointment on 08/15/2022 at 230 PM with Dr. Juleen China at  [facility], which  is located in the [facility]  at  [address] in [place].  [address], which is located to the left of the elevators.  Phone: [phone]  Fax: [phone]  [email or web address]  The patient should arrive 30 minutes prior to their appoitment.   I spent 53 minutes with the patient including than 50% of the time in face to face and non-face-to-face time with the patient personally reviewing along with review of medical records in preparation for the visit and during the visit and in coordination of his care.    I will follow-up the patient's culture data this weekend and make final antibiotic recommendations.  I will then sign off,     LOS: 7 days   Alcide Evener 07/14/2022, 3:17 PM

## 2022-07-14 NOTE — Progress Notes (Signed)
Physical Therapy Treatment Patient Details Name: Eric Barrett. MRN: [id number] DOB: [birth year] Today's Date: 07/14/2022   History of Present Illness Eric Barrett. is a 81 y.o. male  presenting with back pain, MRI showing osteo of the spine.  He was last hospitalized from 10/9-18 with acute on chronic hyponatremia and MRSE UTI.  He went to rehab and returned home on 11/27.   Pt with medical history significant of chronic diastolic CHF; glaucoma; dementia; CVA with L hemiparesis; HTN; and HLD    PT Comments    Pt tolerates treatment well despite back pain. Pt is able to progress to multiple trials of ambulation, continuing to demonstrate BLE tremors with one instance of knee buckling. Pt remains at a high risk for falls due to BLE weakness and impaired motor control. Pt will benefit from continued aggressive mobilization in an effort to restore independence. PT continues to recommend SNF placement.   Recommendations for follow up therapy are one component of a multi-disciplinary discharge planning process, led by the attending physician.  Recommendations may be updated based on patient status, additional functional criteria and insurance authorization.  Follow Up Recommendations  Skilled nursing-short term rehab (<3 hours/day) Can patient physically be transported by private vehicle: No   Assistance Recommended at Discharge Frequent or constant Supervision/Assistance  Patient can return home with the following A little help with walking and/or transfers;A lot of help with bathing/dressing/bathroom;Assistance with cooking/housework;Assistance with feeding;Assist for transportation;Help with stairs or ramp for entrance;Direct supervision/assist for medications management;Direct supervision/assist for financial management   Equipment Recommendations  None recommended by PT    Recommendations for Other Services       Precautions / Restrictions Precautions Precautions:  Fall;Back Precaution Comments: back precautions for comfort Restrictions Weight Bearing Restrictions: No     Mobility  Bed Mobility Overal bed mobility: Needs Assistance Bed Mobility: Sit to Supine       Sit to supine: Min guard        Transfers Overall transfer level: Needs assistance Equipment used: Rolling walker (2 wheels) Transfers: Sit to/from Stand Sit to Stand: Min assist                Ambulation/Gait Ambulation/Gait assistance: Min assist Gait Distance (Feet): 15 Feet (15' x 2) Assistive device: Rolling walker (2 wheels) Gait Pattern/deviations: Step-to pattern Gait velocity: reduced Gait velocity interpretation: <1.31 ft/sec, indicative of household ambulator   General Gait Details: slowed step-to gait, tremors/shaking noted throughout BLE and intermittently in UEs when ambulating. One instance of L knee buckling   Stairs             Wheelchair Mobility    Modified Rankin (Stroke Patients Only)       Balance Overall balance assessment: History of Falls, Needs assistance Sitting-balance support: No upper extremity supported, Feet supported Sitting balance-Leahy Scale: Good     Standing balance support: Bilateral upper extremity supported, Reliant on assistive device for balance Standing balance-Leahy Scale: Poor                              Cognition Arousal/Alertness: Awake/alert Behavior During Therapy: WFL for tasks assessed/performed Overall Cognitive Status: History of cognitive impairments - at baseline                                 General Comments: appropriate during session  Exercises      General Comments General comments (skin integrity, edema, etc.): VSS on RA      Pertinent Vitals/Pain Pain Assessment Pain Assessment: Faces Faces Pain Scale: Hurts even more Pain Location: back Pain Descriptors / Indicators: Grimacing Pain Intervention(s): Monitored during session     Home Living                          Prior Function            PT Goals (current goals can now be found in the care plan section) Acute Rehab PT Goals Patient Stated Goal: to go to rehab prior to home Progress towards PT goals: Progressing toward goals    Frequency    Min 3X/week      PT Plan Current plan remains appropriate    Co-evaluation              AM-PAC PT "6 Clicks" Mobility   Outcome Measure  Help needed turning from your back to your side while in a flat bed without using bedrails?: A Little Help needed moving from lying on your back to sitting on the side of a flat bed without using bedrails?: A Little Help needed moving to and from a bed to a chair (including a wheelchair)?: A Little Help needed standing up from a chair using your arms (e.g., wheelchair or bedside chair)?: A Little Help needed to walk in hospital room?: Total Help needed climbing 3-5 steps with a railing? : Total 6 Click Score: 14    End of Session   Activity Tolerance: Patient tolerated treatment well Patient left: in bed;with call bell/phone within reach;with bed alarm set Nurse Communication: Mobility status PT Visit Diagnosis: Other abnormalities of gait and mobility (R26.89);Difficulty in walking, not elsewhere classified (R26.2);Pain;History of falling (Z91.81);Muscle weakness (generalized) (M62.81) Pain - part of body:  (back)     Time: 1050-1105 PT Time Calculation (min) (ACUTE ONLY): 15 min  Charges:  $Gait Training: 8-22 mins                     Zenaida Niece, PT, DPT Acute Rehabilitation Office [place] [facility] 07/14/2022, 11:08 AM

## 2022-07-14 NOTE — Progress Notes (Signed)
[facility]  PROGRESS NOTE  Eric Barrett. JIR:[id number] DOB: 12/08/1940 DOA: 07/06/2022 PCP: Eric Barrett., FNP   Brief HPI:   81 year old male with medical history of chronic diastolic CHF, glucoma, dementia, CVA with left hemiparesis, hypertension, hyperlipidemia presents with back pain.  MRI showed osteomyelitis of the spine.  He was hospitalized from 10/9 to 10/18 with acute on chronic hyponatremia and MRSE UTI.  He went to rehab and returned home on 11/27.  Patient had trouble walking at home due to severe back pain.  Patient was complaining of pain in the back while in the rehab.  He was using walker and then wheelchair.  Had MRI of the lumbar spine 2 days ago at [facility].  PCP got result and told him to go to the hospital for infection, osteomyelitis at L5-S1.  Patient went to [place].    Subjective   Complains of mild back pain.   Assessment/Plan:    Lumbar osteomyelitis -Patient presented with worsening low back pain, MRI reportedly was diagnostic for osteomyelitis -imaging was performed at [facility], no information available in Care Everywhere -He was recently mated and was diagnosed with UTI from oxacillin resistant Staph epidermidis -Patient's wife brought MRI report on disc, was given to radiology in our hospital -Patient CRP and sed rate are normal -Neurosurgery consulted, no intervention recommended -IR consulted for aspiration -Biopsy done on 12/4 -IV antibiotics started per ID; continue ceftriaxone, daptomycin -Continue pain control with Lidoderm, oxycodone, morphine as needed -Blood cultures negative to date  Hyponatremia -Mild, likely SIADH -Serum osmolality 277, urine sodium 101 -TSH was 0.797 in october -Will start fluid restriction 1500 ML per day  Dementia -No behavior disturbance -Continue Depakote, Namenda  Hypertension -Continue amlodipine, losartan  Hyperlipidemia -Continue rosuvastatin  Hyperkalemia -Mild, will hold  losartan -Will give 1 dose of Lokelma 5 g p.o. -Follow BMP in am  Chronic diastolic CHF -Compensated at this time  BPH -Continue tamsulosin, Myrbetriq, finasteride  Glaucoma -Continue Travatan   Medications     acetaminophen  1,000 mg Oral TID   amLODipine  10 mg Oral Daily   aspirin  81 mg Oral Daily   Chlorhexidine Gluconate Cloth  6 each Topical Daily   divalproex  500 mg Oral QHS   enoxaparin (LOVENOX) injection  40 mg Subcutaneous Q24H   finasteride  5 mg Oral Daily   influenza vaccine adjuvanted  0.5 mL Intramuscular Tomorrow-1000   latanoprost  1 drop Both Eyes QHS   lidocaine  1 patch Transdermal Q24H   memantine  10 mg Oral BID   mirabegron ER  25 mg Oral Daily   pneumococcal 20-valent conjugate vaccine  0.5 mL Intramuscular Tomorrow-1000   polyethylene glycol  17 g Oral BID   rosuvastatin  20 mg Oral Daily   senna-docusate  1 tablet Oral BID   sodium chloride flush  10-40 mL Intracatheter Q12H   tamsulosin  0.4 mg Oral Daily     Data Reviewed:   CBG:  Recent Labs  Lab 07/12/22 1733  GLUCAP 136*    SpO2: 98 % O2 Flow Rate (L/min): 2 L/min    Vitals:   07/13/22 1908 07/13/22 2300 07/14/22 0320 07/14/22 0751  BP: 116/69 (!) 148/77 105/64 123/62  Pulse: 67 74 64 69  Resp: '19 19 16 17  '$ Temp: 98 F (36.7 C) 97.8 F (36.6 C) 97.9 F (36.6 C) 98.2 F (36.8 C)  TempSrc: Oral Oral Oral Oral  SpO2: 98% 98% 99% 98%  Weight:      Height:          Data Reviewed:  Basic Metabolic Panel: Recent Labs  Lab 07/10/22 0435 07/11/22 0456 07/12/22 0612 07/13/22 0432 07/14/22 0510  NA 127* 128* 127* 128* 128*  K 4.7 4.8 4.6 5.2* 4.7  CL 92* 96* 95* 93* 93*  CO2 '26 26 26 27 26  '$ GLUCOSE 110* 99 110* 104* 95  BUN '14 10 9 10 14  '$ CREATININE 1.14 0.99 1.01 1.10 1.13  CALCIUM 8.5* 8.5* 8.6* 8.6* 8.7*    CBC: Recent Labs  Lab 07/09/22 0311 07/10/22 0435 07/11/22 0456 07/12/22 0612 07/13/22 0432  WBC 7.2 6.3 6.6 7.2 5.7  HGB 11.1* 10.9* 10.5*  11.6* 11.0*  HCT 33.3* 31.1* 31.0* 32.7* 31.4*  MCV 81.0 79.1* 80.1 78.0* 79.5*  PLT 342 284 297 289 275    LFT Recent Labs  Lab 07/13/22 0432 07/14/22 0510  AST 16 18  ALT 10 11  ALKPHOS 47 49  BILITOT 0.3 0.3  PROT 6.1* 6.3*  ALBUMIN 2.4* 2.6*     Antibiotics: Anti-infectives (From admission, onward)    Start     Dose/Rate Route Frequency Ordered Stop   07/10/22 2000  DAPTOmycin (CUBICIN) 650 mg in sodium chloride 0.9 % IVPB        8 mg/kg  82.6 kg 126 mL/hr over 30 Minutes Intravenous Daily 07/10/22 1654     07/10/22 1800  cefTRIAXone (ROCEPHIN) 2 g in sodium chloride 0.9 % 100 mL IVPB        2 g 200 mL/hr over 30 Minutes Intravenous Every 24 hours 07/10/22 1654     07/07/22 2300  vancomycin (VANCOREADY) IVPB 1250 mg/250 mL  Status:  Discontinued        1,250 mg 166.7 mL/hr over 90 Minutes Intravenous Every 24 hours 07/06/22 2312 07/07/22 1150   07/07/22 0900  ceFEPIme (MAXIPIME) 2 g in sodium chloride 0.9 % 100 mL IVPB  Status:  Discontinued        2 g 200 mL/hr over 30 Minutes Intravenous Every 12 hours 07/06/22 2330 07/07/22 1150   07/06/22 2315  vancomycin (VANCOCIN) IVPB 1000 mg/200 mL premix  Status:  Discontinued        1,000 mg 200 mL/hr over 60 Minutes Intravenous  Once 07/06/22 2301 07/06/22 2303   07/06/22 2315  vancomycin (VANCOCIN) IVPB 1000 mg/200 mL premix        1,000 mg 200 mL/hr over 60 Minutes Intravenous Every hour 07/06/22 2303 07/07/22 0132   07/06/22 2100  ceFEPIme (MAXIPIME) 2 g in sodium chloride 0.9 % 100 mL IVPB        2 g 200 mL/hr over 30 Minutes Intravenous  Once 07/06/22 2059 07/06/22 2153        DVT prophylaxis: Lovenox  Code Status: Full code  Family Communication: No family at bedside   CONSULTS infectious disease   Objective    Physical Examination:  General-appears in no acute distress Heart-S1-S2, regular, no murmur auscultated Lungs-clear to auscultation bilaterally, no wheezing or crackles  auscultated Abdomen-soft, nontender, no organomegaly Extremities-no edema in the lower extremities Neuro-alert, oriented x3, no focal deficit noted   Status is: Inpatient:        Eric Barrett   [facility] If 7PM-7AM, please contact night-coverage at [email or web address], Office  [phone]   07/14/2022, 9:49 AM  LOS: 7 days

## 2022-07-14 NOTE — Progress Notes (Addendum)
Voicemail left for pt's wife Lelon Frohlich requesting return call to discuss SNF. Pt has one offer from [facility] at this time.   UPDATE 1300: return call received from pt's wife. Only bed offer currently available ([facility]) provided to wife, she plans to review and will update SW. Facility to start auth once accepted.    UPDATE 1352: call received from pt's wife who confirmed she will accept offer from [facility]. Notified Whitney with [facility] admissions and she will start [place]. SW will provide updates as available.     Wandra Feinstein, MSW, LCSW [phone] (coverage)

## 2022-07-14 NOTE — Progress Notes (Addendum)
Mobility Specialist Progress Note   07/14/22 1524  Mobility  Activity Ambulated with assistance in hallway;Ambulated with assistance in room  Level of Assistance Minimal assist, patient does 75% or more  Assistive Device Front wheel walker  Distance Ambulated (ft) 92 ft [phone]  Activity Response Tolerated well  $Mobility charge 1 Mobility   Pre Mobility:78 HR, 130/78 BP, 99% SpO2 on RA  During Mobility: 108 - 126 HR, 92% SpO2 on RA  Post Mobility: 86 HR, 129/68 BP, 100% SpO2 on RA   Received in bed having 5/10 back pain but agreeable. Pt still generally weak presenting w/ tremors in BLE but able to increase activity tolerance today. X3 seated rest d/t fatigue + multiple small bouts of standing rest breaks but no faults throughout. Requiring mod cues throughout for sequencing to keep pt attentive to the task at hand. Return back to room in chair, able to walk back to bed from doorway w/o incident. Left w/ call bell in reach and all needs met.    [place]land Falling Mobility Specialist Please contact via SecureChat or  Rehab office at [phone]

## 2022-07-15 DIAGNOSIS — F03B18 Unspecified dementia, moderate, with other behavioral disturbance: Secondary | ICD-10-CM | POA: Diagnosis not present

## 2022-07-15 DIAGNOSIS — M4626 Osteomyelitis of vertebra, lumbar region: Secondary | ICD-10-CM | POA: Diagnosis not present

## 2022-07-15 DIAGNOSIS — M4647 Discitis, unspecified, lumbosacral region: Secondary | ICD-10-CM | POA: Diagnosis not present

## 2022-07-15 LAB — BASIC METABOLIC PANEL
Anion gap: 10 (ref 5–15)
BUN: 10 mg/dL (ref 8–23)
CO2: 25 mmol/L (ref 22–32)
Calcium: 9.1 mg/dL (ref 8.9–10.3)
Chloride: 92 mmol/L — ABNORMAL LOW (ref 98–111)
Creatinine, Ser: 1 mg/dL (ref 0.61–1.24)
GFR, Estimated: >60 mL/min (ref >60)
Glucose, Bld: 122 mg/dL — ABNORMAL HIGH (ref 70–99)
Potassium: 4.4 mmol/L (ref 3.5–5.1)
Sodium: 127 mmol/L — ABNORMAL LOW (ref 135–145)

## 2022-07-15 LAB — AEROBIC/ANAEROBIC CULTURE W GRAM STAIN (SURGICAL/DEEP WOUND)

## 2022-07-15 NOTE — Progress Notes (Signed)
Mobility Specialist Progress Note   07/15/22 1117  Mobility  Activity Ambulated with assistance in room;Transferred from bed to chair  Level of Assistance Minimal assist, patient does 75% or more  Assistive Device Front wheel walker  Distance Ambulated (ft) 16 ft  Activity Response Tolerated well  $Mobility charge 1 Mobility   Pre Mobility: 78 HR, 124/78 BP, 100% SpO2 During Mobility: 124 HR, 92% SpO2 Post Mobility: 84 HR, 142/76 BP, 97% SpO2  Pt limited by pain this session. Requiring minA EOB>stand d/t gen weakness. Tremors more prevalent when ambulating today even w/ standing rest breaks. Pt able to get to doorway w/ a seated rest break, x2 standing trials from chair to began ambulation into hallway but pt expressing too much pain in lower back and requesting to stop. Rolled back to room, placed call bell in reach, chair alarm on and RN notified about increased pain.   Holland Falling Mobility Specialist Please contact via SecureChat or  Rehab office at [phone]

## 2022-07-15 NOTE — Progress Notes (Signed)
[facility]  PROGRESS NOTE  Eric Barrett. [id number]:[id number] DOB: [birth year] DOA: 07/06/2022 PCP: [name], FNP   Brief HPI:   81 year old male with medical history of chronic diastolic CHF, glucoma, dementia, CVA with left hemiparesis, hypertension, hyperlipidemia presents with back pain.  MRI showed osteomyelitis of the spine.  He was hospitalized from 10/9 to 10/18 with acute on chronic hyponatremia and MRSE UTI.  He went to rehab and returned home on 11/27.  Patient had trouble walking at home due to severe back pain.  Patient was complaining of pain in the back while in the rehab.  He was using walker and then wheelchair.  Had MRI of the lumbar spine 2 days ago at [facility].  PCP got result and told him to go to the hospital for infection, osteomyelitis at L5-S1.  Patient went to [place].    Subjective   Patient seen and examined, pain adequately controlled.   Assessment/Plan:    Lumbar osteomyelitis -Patient presented with worsening low back pain, MRI reportedly was diagnostic for osteomyelitis -imaging was performed at [facility], no information available in Care Everywhere -He was recently mated and was diagnosed with UTI from oxacillin resistant Staph epidermidis -Patient's wife brought MRI report on disc, was given to radiology in our hospital -Patient CRP and sed rate are normal -Neurosurgery consulted, no intervention recommended -IR consulted for aspiration -Biopsy done on 12/4 -IV antibiotics started per ID; continue  daptomycin -Continue pain control with Lidoderm, oxycodone, morphine as needed -Blood cultures are negative to date -Abscess cultures grew moderate Staph epidermidis; final susceptibility pending -ID has changed antibiotics to IV daptomycin 650 mg IV every 24 hours for 6 weeks, end date 08/20/2022  Hyponatremia -Mild, likely SIADH -Serum osmolality 277, urine sodium 101 -TSH was 0.797 in october -Continue fluid restriction 1500 ML per  day  Dementia -No behavior disturbance -Continue Depakote, Namenda  Hypertension -Continue amlodipine, losartan  Hyperlipidemia -Continue rosuvastatin  Hyperkalemia -Resolved, losartan on hold -Was given Lokelma 5 g p.o. x 1  Chronic diastolic CHF -Compensated at this time  BPH -Continue tamsulosin, Myrbetriq, finasteride  Glaucoma -Continue Travatan   Medications     acetaminophen  1,000 mg Oral TID   amLODipine  10 mg Oral Daily   aspirin  81 mg Oral Daily   Chlorhexidine Gluconate Cloth  6 each Topical Daily   divalproex  500 mg Oral QHS   enoxaparin (LOVENOX) injection  40 mg Subcutaneous Q24H   finasteride  5 mg Oral Daily   latanoprost  1 drop Both Eyes QHS   lidocaine  1 patch Transdermal Q24H   memantine  10 mg Oral BID   mirabegron ER  25 mg Oral Daily   polyethylene glycol  17 g Oral BID   rosuvastatin  20 mg Oral Daily   senna-docusate  1 tablet Oral BID   sodium chloride flush  10-40 mL Intracatheter Q12H   tamsulosin  0.4 mg Oral Daily     Data Reviewed:   CBG:  Recent Labs  Lab 07/12/22 1733  GLUCAP 136*    SpO2: 97 % O2 Flow Rate (L/min): 2 L/min    Vitals:   07/14/22 2318 07/15/22 0335 07/15/22 0718 07/15/22 1045  BP: 119/64 128/71 129/66 124/78  Pulse: 68 68 76 83  Resp: '17 17 14 20  '$ Temp: (!) 97.5 F (36.4 C) 97.7 F (36.5 C) 98.6 F (37 C) 98.3 F (36.8 C)  TempSrc: Oral Oral Oral Oral  SpO2: 98%  98% 95% 97%  Weight:      Height:          Data Reviewed:  Basic Metabolic Panel: Recent Labs  Lab 07/11/22 0456 07/12/22 0612 07/13/22 0432 07/14/22 0510 07/15/22 1017  NA 128* 127* 128* 128* 127*  K 4.8 4.6 5.2* 4.7 4.4  CL 96* 95* 93* 93* 92*  CO2 '26 26 27 26 25  '$ GLUCOSE 99 110* 104* 95 122*  BUN '10 9 10 14 10  '$ CREATININE 0.99 1.01 1.10 1.13 1.00  CALCIUM 8.5* 8.6* 8.6* 8.7* 9.1    CBC: Recent Labs  Lab 07/09/22 0311 07/10/22 0435 07/11/22 0456 07/12/22 0612 07/13/22 0432  WBC 7.2 6.3 6.6 7.2 5.7   HGB 11.1* 10.9* 10.5* 11.6* 11.0*  HCT 33.3* 31.1* 31.0* 32.7* 31.4*  MCV 81.0 79.1* 80.1 78.0* 79.5*  PLT 342 284 297 289 275    LFT Recent Labs  Lab 07/13/22 0432 07/14/22 0510  AST 16 18  ALT 10 11  ALKPHOS 47 49  BILITOT 0.3 0.3  PROT 6.1* 6.3*  ALBUMIN 2.4* 2.6*     Antibiotics: Anti-infectives (From admission, onward)    Start     Dose/Rate Route Frequency Ordered Stop   07/10/22 2000  DAPTOmycin (CUBICIN) 650 mg in sodium chloride 0.9 % IVPB        8 mg/kg  82.6 kg 126 mL/hr over 30 Minutes Intravenous Daily 07/10/22 1654     07/10/22 1800  cefTRIAXone (ROCEPHIN) 2 g in sodium chloride 0.9 % 100 mL IVPB  Status:  Discontinued        2 g 200 mL/hr over 30 Minutes Intravenous Every 24 hours 07/10/22 1654 07/14/22 1256   07/07/22 2300  vancomycin (VANCOREADY) IVPB 1250 mg/250 mL  Status:  Discontinued        1,250 mg 166.7 mL/hr over 90 Minutes Intravenous Every 24 hours 07/06/22 2312 07/07/22 1150   07/07/22 0900  ceFEPIme (MAXIPIME) 2 g in sodium chloride 0.9 % 100 mL IVPB  Status:  Discontinued        2 g 200 mL/hr over 30 Minutes Intravenous Every 12 hours 07/06/22 2330 07/07/22 1150   07/06/22 2315  vancomycin (VANCOCIN) IVPB 1000 mg/200 mL premix  Status:  Discontinued        1,000 mg 200 mL/hr over 60 Minutes Intravenous  Once 07/06/22 2301 07/06/22 2303   07/06/22 2315  vancomycin (VANCOCIN) IVPB 1000 mg/200 mL premix        1,000 mg 200 mL/hr over 60 Minutes Intravenous Every hour 07/06/22 2303 07/07/22 0132   07/06/22 2100  ceFEPIme (MAXIPIME) 2 g in sodium chloride 0.9 % 100 mL IVPB        2 g 200 mL/hr over 30 Minutes Intravenous  Once 07/06/22 2059 07/06/22 2153        DVT prophylaxis: Lovenox  Code Status: Full code  Family Communication: No family at bedside   CONSULTS infectious disease   Objective    Physical Examination:  General-appears in no acute distress Heart-S1-S2, regular, no murmur auscultated Lungs-clear to  auscultation bilaterally, no wheezing or crackles auscultated Abdomen-soft, nontender, no organomegaly Extremities-no edema in the lower extremities Neuro-alert, oriented x3, no focal deficit noted   Status is: Inpatient:        [name]   [facility] If 7PM-7AM, please contact night-coverage at [email or web address], Office  [phone]   07/15/2022, 1:24 PM  LOS: 8 days

## 2022-07-16 ENCOUNTER — Other Ambulatory Visit: Payer: Medicare HMO

## 2022-07-16 DIAGNOSIS — M4647 Discitis, unspecified, lumbosacral region: Secondary | ICD-10-CM | POA: Diagnosis not present

## 2022-07-16 DIAGNOSIS — F03B18 Unspecified dementia, moderate, with other behavioral disturbance: Secondary | ICD-10-CM | POA: Diagnosis not present

## 2022-07-16 DIAGNOSIS — M4626 Osteomyelitis of vertebra, lumbar region: Secondary | ICD-10-CM | POA: Diagnosis not present

## 2022-07-16 NOTE — Progress Notes (Signed)
[facility]  PROGRESS NOTE  Eric Barrett. YCX:[id number] DOB: 1941/02/19 DOA: 07/06/2022 PCP: [name], FNP   Brief HPI:   81 year old male with medical history of chronic diastolic CHF, glucoma, dementia, CVA with left hemiparesis, hypertension, hyperlipidemia presents with back pain.  MRI showed osteomyelitis of the spine.  He was hospitalized from 10/9 to 10/18 with acute on chronic hyponatremia and MRSE UTI.  He went to rehab and returned home on 11/27.  Patient had trouble walking at home due to severe back pain.  Patient was complaining of pain in the back while in the rehab.  He was using walker and then wheelchair.  Had MRI of the lumbar spine 2 days ago at [facility].  PCP got result and told him to go to the hospital for infection, osteomyelitis at L5-S1.  Patient went to [place].    Subjective   Patient seen and examined, denies any complaints.       Assessment/Plan:    Lumbar osteomyelitis -Patient presented with worsening low back pain, MRI reportedly was diagnostic for osteomyelitis -imaging was performed at [facility], no information available in Care Everywhere -He was recently mated and was diagnosed with UTI from oxacillin resistant Staph epidermidis -Patient's wife brought MRI report on disc, was given to radiology in our hospital -Patient CRP and sed rate are normal -Neurosurgery consulted, no intervention recommended -IR consulted for aspiration -Biopsy done on 12/4 -IV antibiotics started per ID; continue  daptomycin -Continue pain control with Lidoderm, oxycodone, morphine as needed -Blood cultures are negative to date -Abscess cultures grew moderate Staph epidermidis; final susceptibility pending -ID has changed antibiotics to IV daptomycin 650 mg IV every 24 hours for 6 weeks, end date 08/20/2022  Hyponatremia -Mild, likely SIADH -Serum osmolality 277, urine sodium 101 -TSH was 0.797 in october -Continue fluid restriction 1500 ML  per day  Dementia -No behavior disturbance -Continue Depakote, Namenda  Hypertension -Continue amlodipine, losartan  Hyperlipidemia -Continue rosuvastatin  Hyperkalemia -Resolved, losartan on hold -Was given Lokelma 5 g p.o. x 1  Chronic diastolic CHF -Compensated at this time  BPH -Continue tamsulosin, Myrbetriq, finasteride  Glaucoma -Continue Travatan   Medications     acetaminophen  1,000 mg Oral TID   amLODipine  10 mg Oral Daily   aspirin  81 mg Oral Daily   Chlorhexidine Gluconate Cloth  6 each Topical Daily   divalproex  500 mg Oral QHS   enoxaparin (LOVENOX) injection  40 mg Subcutaneous Q24H   finasteride  5 mg Oral Daily   latanoprost  1 drop Both Eyes QHS   lidocaine  1 patch Transdermal Q24H   memantine  10 mg Oral BID   mirabegron ER  25 mg Oral Daily   polyethylene glycol  17 g Oral BID   rosuvastatin  20 mg Oral Daily   senna-docusate  1 tablet Oral BID   sodium chloride flush  10-40 mL Intracatheter Q12H   tamsulosin  0.4 mg Oral Daily     Data Reviewed:   CBG:  Recent Labs  Lab 07/12/22 1733  GLUCAP 136*    SpO2: 97 % O2 Flow Rate (L/min): 2 L/min    Vitals:   07/16/22 0830 07/16/22 1047 07/16/22 1100 07/16/22 1200  BP:  134/81  122/68  Pulse: 88 (!) 111 95 85  Resp: '19 20  20  '$ Temp:    98.7 F (37.1 C)  TempSrc:    Oral  SpO2: 100% 99%  97%  Weight:  Height:          Data Reviewed:  Basic Metabolic Panel: Recent Labs  Lab 07/11/22 0456 07/12/22 0612 07/13/22 0432 07/14/22 0510 07/15/22 1017  NA 128* 127* 128* 128* 127*  K 4.8 4.6 5.2* 4.7 4.4  CL 96* 95* 93* 93* 92*  CO2 '26 26 27 26 25  '$ GLUCOSE 99 110* 104* 95 122*  BUN '10 9 10 14 10  '$ CREATININE 0.99 1.01 1.10 1.13 1.00  CALCIUM 8.5* 8.6* 8.6* 8.7* 9.1    CBC: Recent Labs  Lab 07/10/22 0435 07/11/22 0456 07/12/22 0612 07/13/22 0432  WBC 6.3 6.6 7.2 5.7  HGB 10.9* 10.5* 11.6* 11.0*  HCT 31.1* 31.0* 32.7* 31.4*  MCV 79.1* 80.1 78.0* 79.5*   PLT 284 297 289 275    LFT Recent Labs  Lab 07/13/22 0432 07/14/22 0510  AST 16 18  ALT 10 11  ALKPHOS 47 49  BILITOT 0.3 0.3  PROT 6.1* 6.3*  ALBUMIN 2.4* 2.6*     Antibiotics: Anti-infectives (From admission, onward)    Start     Dose/Rate Route Frequency Ordered Stop   07/10/22 2000  DAPTOmycin (CUBICIN) 650 mg in sodium chloride 0.9 % IVPB        8 mg/kg  82.6 kg 126 mL/hr over 30 Minutes Intravenous Daily 07/10/22 1654     07/10/22 1800  cefTRIAXone (ROCEPHIN) 2 g in sodium chloride 0.9 % 100 mL IVPB  Status:  Discontinued        2 g 200 mL/hr over 30 Minutes Intravenous Every 24 hours 07/10/22 1654 07/14/22 1256   07/07/22 2300  vancomycin (VANCOREADY) IVPB 1250 mg/250 mL  Status:  Discontinued        1,250 mg 166.7 mL/hr over 90 Minutes Intravenous Every 24 hours 07/06/22 2312 07/07/22 1150   07/07/22 0900  ceFEPIme (MAXIPIME) 2 g in sodium chloride 0.9 % 100 mL IVPB  Status:  Discontinued        2 g 200 mL/hr over 30 Minutes Intravenous Every 12 hours 07/06/22 2330 07/07/22 1150   07/06/22 2315  vancomycin (VANCOCIN) IVPB 1000 mg/200 mL premix  Status:  Discontinued        1,000 mg 200 mL/hr over 60 Minutes Intravenous  Once 07/06/22 2301 07/06/22 2303   07/06/22 2315  vancomycin (VANCOCIN) IVPB 1000 mg/200 mL premix        1,000 mg 200 mL/hr over 60 Minutes Intravenous Every hour 07/06/22 2303 07/07/22 0132   07/06/22 2100  ceFEPIme (MAXIPIME) 2 g in sodium chloride 0.9 % 100 mL IVPB        2 g 200 mL/hr over 30 Minutes Intravenous  Once 07/06/22 2059 07/06/22 2153        DVT prophylaxis: Lovenox  Code Status: Full code  Family Communication: No family at bedside   CONSULTS infectious disease   Objective    Physical Examination:  General-appears in no acute distress Heart-S1-S2, regular, no murmur auscultated Lungs-clear to auscultation bilaterally Abdomen-soft, nontender, no organomegaly Extremities-no edema in the lower  extremities Neuro-alert, oriented x3, no focal deficit noted   Status is: Inpatient:        [name]   [facility] If 7PM-7AM, please contact night-coverage at [email or web address], Office  [phone]   07/16/2022, 2:30 PM  LOS: 9 days

## 2022-07-16 NOTE — Progress Notes (Signed)
      INFECTIOUS DISEASE ATTENDING ADDENDUM:   Date: 07/16/2022  Patient name: Eric Barrett.  Medical record number: [id number]  Date of birth: [birth year]   Staph Epi is MR  WIll go with daptomycin x 6 weeks  See my last note.   She has appt and OPAT In place  I will sign off.  Rhina Brackett Dam 07/16/2022, 11:25 AM

## 2022-07-17 DIAGNOSIS — M4626 Osteomyelitis of vertebra, lumbar region: Secondary | ICD-10-CM | POA: Diagnosis not present

## 2022-07-17 DIAGNOSIS — F03B18 Unspecified dementia, moderate, with other behavioral disturbance: Secondary | ICD-10-CM | POA: Diagnosis not present

## 2022-07-17 DIAGNOSIS — M4647 Discitis, unspecified, lumbosacral region: Secondary | ICD-10-CM | POA: Diagnosis not present

## 2022-07-17 LAB — BASIC METABOLIC PANEL
Anion gap: 9 (ref 5–15)
BUN: 13 mg/dL (ref 8–23)
CO2: 26 mmol/L (ref 22–32)
Calcium: 9 mg/dL (ref 8.9–10.3)
Chloride: 89 mmol/L — ABNORMAL LOW (ref 98–111)
Creatinine, Ser: 1.07 mg/dL (ref 0.61–1.24)
GFR, Estimated: >60 mL/min (ref >60)
Glucose, Bld: 97 mg/dL (ref 70–99)
Potassium: 4.9 mmol/L (ref 3.5–5.1)
Sodium: 124 mmol/L — ABNORMAL LOW (ref 135–145)

## 2022-07-17 NOTE — Progress Notes (Addendum)
CSW spoke with Altha Harm at [facility] who states the facility can administer the patient's IV antibiotics.   CSW spoke with Earnest Bailey, CMA who states patient's insurance authorization has been approved - start date today, 07/17/22 and next review date being 07/23/22. Authorization certification #[id number].   Madilyn Fireman, MSW, LCSW Transitions of Care  Clinical Social Worker II [phone]

## 2022-07-17 NOTE — Progress Notes (Signed)
[facility]  PROGRESS NOTE  Eric Barrett. [id number] DOB: [birth year] DOA: 07/06/2022 PCP: Eric Side., FNP   Brief HPI:   81 year old male with medical history of chronic diastolic CHF, glucoma, dementia, CVA with left hemiparesis, hypertension, hyperlipidemia presents with back pain.  MRI showed osteomyelitis of the spine.  He was hospitalized from 10/9 to 10/18 with acute on chronic hyponatremia and MRSE UTI.  He went to rehab and returned home on 11/27.  Patient had trouble walking at home due to severe back pain.  Patient was complaining of pain in the back while in the rehab.  He was using walker and then wheelchair.  Had MRI of the lumbar spine 2 days ago at [facility].  PCP got result and told him to go to the hospital for infection, osteomyelitis at L5-S1.  Patient went to [place].    Subjective   Patient seen and examined, sodium is low at 124 today   Assessment/Plan:    Lumbar osteomyelitis -Patient presented with worsening low back pain, MRI reportedly was diagnostic for osteomyelitis -imaging was performed at [facility], no information available in Care Everywhere -He was recently mated and was diagnosed with UTI from oxacillin resistant Staph epidermidis -Patient's wife brought MRI report on disc, was given to radiology in our hospital -Patient CRP and sed rate are normal -Neurosurgery consulted, no intervention recommended -IR consulted for aspiration -Biopsy done on 12/4 -IV antibiotics started per ID; continue  daptomycin -Continue pain control with Lidoderm, oxycodone, morphine as needed -Blood cultures are negative to date -Abscess cultures grew moderate Staph epidermidis; final susceptibility pending -ID has changed antibiotics to IV daptomycin 650 mg IV every 24 hours for 6 weeks, end date 08/20/2022  Hyponatremia -chronic, likely SIADH -Serum osmolality 277, urine sodium 101 -TSH was 0.797 in October - Discussed with nephrology, will  change fluid restriction to 1200 ml/day - will discontinue Tramadol, as it can cause hyponatremia   Dementia -No behavior disturbance -Continue Depakote, Namenda  Hypertension -Continue amlodipine, losartan  Hyperlipidemia -Continue rosuvastatin  Hyperkalemia -Resolved  Chronic diastolic CHF -Compensated at this time  BPH -Continue tamsulosin, Myrbetriq, finasteride  Glaucoma -Continue Travatan   Medications     acetaminophen  1,000 mg Oral TID   amLODipine  10 mg Oral Daily   aspirin  81 mg Oral Daily   Chlorhexidine Gluconate Cloth  6 each Topical Daily   divalproex  500 mg Oral QHS   enoxaparin (LOVENOX) injection  40 mg Subcutaneous Q24H   finasteride  5 mg Oral Daily   latanoprost  1 drop Both Eyes QHS   lidocaine  1 patch Transdermal Q24H   memantine  10 mg Oral BID   mirabegron ER  25 mg Oral Daily   polyethylene glycol  17 g Oral BID   rosuvastatin  20 mg Oral Daily   senna-docusate  1 tablet Oral BID   sodium chloride flush  10-40 mL Intracatheter Q12H   tamsulosin  0.4 mg Oral Daily     Data Reviewed:   CBG:  Recent Labs  Lab 07/12/22 1733  GLUCAP 136*    SpO2: 97 % O2 Flow Rate (L/min): 2 L/min    Vitals:   07/16/22 2343 07/17/22 0307 07/17/22 0328 07/17/22 0849  BP: 119/67 120/65 121/66 137/60  Pulse: 65 68 61 72  Resp:  20  17  Temp: 98 F (36.7 C) 98.2 F (36.8 C) 97.6 F (36.4 C) (!) 97.5 F (36.4 C)  TempSrc: Oral  Oral Oral Oral  SpO2: 97%  97%   Weight:      Height:          Data Reviewed:  Basic Metabolic Panel: Recent Labs  Lab 07/12/22 0612 07/13/22 0432 07/14/22 0510 07/15/22 1017 07/17/22 0617  NA 127* 128* 128* 127* 124*  K 4.6 5.2* 4.7 4.4 4.9  CL 95* 93* 93* 92* 89*  CO2 '26 27 26 25 26  '$ GLUCOSE 110* 104* 95 122* 97  BUN '9 10 14 10 13  '$ CREATININE 1.01 1.10 1.13 1.00 1.07  CALCIUM 8.6* 8.6* 8.7* 9.1 9.0    CBC: Recent Labs  Lab 07/11/22 0456 07/12/22 0612 07/13/22 0432  WBC 6.6 7.2 5.7   HGB 10.5* 11.6* 11.0*  HCT 31.0* 32.7* 31.4*  MCV 80.1 78.0* 79.5*  PLT 297 289 275    LFT Recent Labs  Lab 07/13/22 0432 07/14/22 0510  AST 16 18  ALT 10 11  ALKPHOS 47 49  BILITOT 0.3 0.3  PROT 6.1* 6.3*  ALBUMIN 2.4* 2.6*     Antibiotics: Anti-infectives (From admission, onward)    Start     Dose/Rate Route Frequency Ordered Stop   07/10/22 2000  DAPTOmycin (CUBICIN) 650 mg in sodium chloride 0.9 % IVPB        8 mg/kg  82.6 kg 126 mL/hr over 30 Minutes Intravenous Daily 07/10/22 1654     07/10/22 1800  cefTRIAXone (ROCEPHIN) 2 g in sodium chloride 0.9 % 100 mL IVPB  Status:  Discontinued        2 g 200 mL/hr over 30 Minutes Intravenous Every 24 hours 07/10/22 1654 07/14/22 1256   07/07/22 2300  vancomycin (VANCOREADY) IVPB 1250 mg/250 mL  Status:  Discontinued        1,250 mg 166.7 mL/hr over 90 Minutes Intravenous Every 24 hours 07/06/22 2312 07/07/22 1150   07/07/22 0900  ceFEPIme (MAXIPIME) 2 g in sodium chloride 0.9 % 100 mL IVPB  Status:  Discontinued        2 g 200 mL/hr over 30 Minutes Intravenous Every 12 hours 07/06/22 2330 07/07/22 1150   07/06/22 2315  vancomycin (VANCOCIN) IVPB 1000 mg/200 mL premix  Status:  Discontinued        1,000 mg 200 mL/hr over 60 Minutes Intravenous  Once 07/06/22 2301 07/06/22 2303   07/06/22 2315  vancomycin (VANCOCIN) IVPB 1000 mg/200 mL premix        1,000 mg 200 mL/hr over 60 Minutes Intravenous Every hour 07/06/22 2303 07/07/22 0132   07/06/22 2100  ceFEPIme (MAXIPIME) 2 g in sodium chloride 0.9 % 100 mL IVPB        2 g 200 mL/hr over 30 Minutes Intravenous  Once 07/06/22 2059 07/06/22 2153        DVT prophylaxis: Lovenox  Code Status: Full code  Family Communication: No family at bedside   CONSULTS infectious disease   Objective    Physical Examination:  General-appears in no acute distress Heart-S1-S2, regular, no murmur auscultated Lungs-clear to auscultation bilaterally Abdomen-soft, nontender, no  organomegaly Extremities-no edema in the lower extremities Neuro-alert, oriented x3, no focal deficit noted   Status is: Inpatient:        [name]   [facility] If 7PM-7AM, please contact night-coverage at [email or web address], Office  [phone]   07/17/2022, 9:08 AM  LOS: 10 days

## 2022-07-17 NOTE — Progress Notes (Signed)
Mobility Specialist Progress Note   07/17/22 1549  Mobility  Activity Refused mobility   Patient limited by significant pain. Will f/u as time permits.  [place] Cobie Leidner, BS EXP Mobility Specialist Please contact via SecureChat or Rehab office at [phone]

## 2022-07-17 NOTE — Progress Notes (Signed)
Physical Therapy Treatment Patient Details Name: Eric Barrett. MRN: [id number] DOB: May 10, 1941 Today's Date: 07/17/2022   History of Present Illness Eric Barrett. is a 81 y.o. male  presenting with back pain, MRI showing osteo of the spine.  He was last hospitalized from 10/9-18 with acute on chronic hyponatremia and MRSE UTI.  He went to rehab and returned home on 11/27.   Pt with medical history significant of chronic diastolic CHF; glaucoma; dementia; CVA with L hemiparesis; HTN; and HLD    PT Comments    Pt with 10/10 pain and unable to tolerate standing this date. Pt begins full body shaking with any movement. Pt full body shaking upon standing and unable to tolerate >2 sec, attempted x3. Alerted RN of patients shaking and request for pain meds. Acute PT to return as able to progress mobility.    Recommendations for follow up therapy are one component of a multi-disciplinary discharge planning process, led by the attending physician.  Recommendations may be updated based on patient status, additional functional criteria and insurance authorization.  Follow Up Recommendations  Skilled nursing-short term rehab (<3 hours/day) Can patient physically be transported by private vehicle: No   Assistance Recommended at Discharge Frequent or constant Supervision/Assistance  Patient can return home with the following A little help with walking and/or transfers;A lot of help with bathing/dressing/bathroom;Assistance with cooking/housework;Assistance with feeding;Assist for transportation;Help with stairs or ramp for entrance;Direct supervision/assist for medications management;Direct supervision/assist for financial management   Equipment Recommendations  None recommended by PT    Recommendations for Other Services       Precautions / Restrictions Precautions Precautions: Fall;Back Precaution Comments: back precautions for comfort Restrictions Weight Bearing Restrictions: No      Mobility  Bed Mobility Overal bed mobility: Needs Assistance Bed Mobility: Rolling, Sidelying to Sit, Sit to Sidelying Rolling: Min guard Sidelying to sit: Min assist     Sit to sidelying: Mod assist General bed mobility comments: cues for log roll; technique, minA for trunk elevation and LE management back into bed    Transfers Overall transfer level: Needs assistance Equipment used: Rolling walker (2 wheels) Transfers: Sit to/from Stand Sit to Stand: Max assist           General transfer comment: maxA to power up x 3, pt very shaky and unable to achieve full upright posture, pt reports 10/10 back pain    Ambulation/Gait               General Gait Details: unable to ambulate this date due to shakiness and 10/10 pain, pt unable to tolerate upright posture   Stairs             Wheelchair Mobility    Modified Rankin (Stroke Patients Only)       Balance Overall balance assessment: History of Falls, Needs assistance Sitting-balance support: No upper extremity supported, Feet supported Sitting balance-Leahy Scale: Good     Standing balance support: Bilateral upper extremity supported, Reliant on assistive device for balance Standing balance-Leahy Scale: Poor Standing balance comment: unable to tolerate standing                            Cognition Arousal/Alertness: Awake/alert Behavior During Therapy: WFL for tasks assessed/performed Overall Cognitive Status: History of cognitive impairments - at baseline  General Comments: pt reports "in a couple of days" when asked the last time he had pain meds        Exercises      General Comments General comments (skin integrity, edema, etc.): VSS on RA      Pertinent Vitals/Pain Pain Assessment Pain Assessment: 0-10 Pain Score: 10-Worst pain ever Pain Location: back Pain Descriptors / Indicators: Grimacing (full body shaking)    Home  Living                          Prior Function            PT Goals (current goals can now be found in the care plan section) Acute Rehab PT Goals Patient Stated Goal: stop the pain PT Goal Formulation: With patient/family Time For Goal Achievement: 07/24/22 Potential to Achieve Goals: Good Progress towards PT goals: Not progressing toward goals - comment (limited by pain)    Frequency    Min 3X/week      PT Plan Current plan remains appropriate    Co-evaluation              AM-PAC PT "6 Clicks" Mobility   Outcome Measure  Help needed turning from your back to your side while in a flat bed without using bedrails?: A Little Help needed moving from lying on your back to sitting on the side of a flat bed without using bedrails?: A Lot Help needed moving to and from a bed to a chair (including a wheelchair)?: A Lot Help needed standing up from a chair using your arms (e.g., wheelchair or bedside chair)?: A Lot Help needed to walk in hospital room?: Total Help needed climbing 3-5 steps with a railing? : Total 6 Click Score: 11    End of Session Equipment Utilized During Treatment: Gait belt Activity Tolerance: Patient limited by pain Patient left: in bed;with call bell/phone within reach;with bed alarm set Nurse Communication: Mobility status;Patient requests pain meds PT Visit Diagnosis: Other abnormalities of gait and mobility (R26.89);Difficulty in walking, not elsewhere classified (R26.2);Pain;History of falling (Z91.81);Muscle weakness (generalized) (M62.81) Pain - part of body:  (back)     Time: 1040-1056 PT Time Calculation (min) (ACUTE ONLY): 16 min  Charges:  $Therapeutic Activity: 8-22 mins                     Kittie Plater, PT, DPT Acute Rehabilitation Services Secure chat preferred Office #: [phone]    Berline Lopes 07/17/2022, 12:28 PM

## 2022-07-18 DIAGNOSIS — M4626 Osteomyelitis of vertebra, lumbar region: Secondary | ICD-10-CM | POA: Diagnosis not present

## 2022-07-18 DIAGNOSIS — F02A0 Dementia in other diseases classified elsewhere, mild, without behavioral disturbance, psychotic disturbance, mood disturbance, and anxiety: Secondary | ICD-10-CM | POA: Diagnosis not present

## 2022-07-18 DIAGNOSIS — G301 Alzheimer's disease with late onset: Secondary | ICD-10-CM | POA: Diagnosis not present

## 2022-07-18 LAB — SODIUM, URINE, RANDOM: Sodium, Ur: 109 mmol/L

## 2022-07-18 LAB — POTASSIUM: Potassium: 4.8 mmol/L (ref 3.5–5.1)

## 2022-07-18 LAB — SODIUM: Sodium: 125 mmol/L — ABNORMAL LOW (ref 135–145)

## 2022-07-18 LAB — BASIC METABOLIC PANEL
Anion gap: 8 (ref 5–15)
BUN: 16 mg/dL (ref 8–23)
CO2: 27 mmol/L (ref 22–32)
Calcium: 9 mg/dL (ref 8.9–10.3)
Chloride: 90 mmol/L — ABNORMAL LOW (ref 98–111)
Creatinine, Ser: 1.14 mg/dL (ref 0.61–1.24)
GFR, Estimated: >60 mL/min (ref >60)
Glucose, Bld: 93 mg/dL (ref 70–99)
Potassium: 5.4 mmol/L — ABNORMAL HIGH (ref 3.5–5.1)
Sodium: 125 mmol/L — ABNORMAL LOW (ref 135–145)

## 2022-07-18 LAB — URIC ACID: Uric Acid, Serum: 3.8 mg/dL (ref 3.7–8.6)

## 2022-07-18 LAB — CK: Total CK: 71 U/L (ref 49–397)

## 2022-07-18 MED ORDER — MORPHINE SULFATE ER 15 MG PO TBCR
15.0000 mg | EXTENDED_RELEASE_TABLET | Freq: Two times a day (BID) | ORAL | Status: DC
  Administered 2022-07-18 – 2022-07-26 (×17): 15 mg via ORAL
  Filled 2022-07-18 (×17): qty 1

## 2022-07-18 NOTE — Progress Notes (Signed)
[facility]  PROGRESS NOTE  Eric Barrett. IHW:[id number] DOB: [birth year] DOA: 07/06/2022 PCP: [name], FNP   Brief HPI:   81 year old male with medical history of chronic diastolic CHF, glucoma, dementia, CVA with left hemiparesis, hypertension, hyperlipidemia presents with back pain.  MRI showed osteomyelitis of the spine.  He was hospitalized from 10/9 to 10/18 with acute on chronic hyponatremia and MRSE UTI.  He went to rehab and returned home on 11/27.  Patient had trouble walking at home due to severe back pain.  Patient was complaining of pain in the back while in the rehab.  He was using walker and then wheelchair.  Had MRI of the lumbar spine 2 days ago at [facility].  PCP got result and told him to go to the hospital for infection, osteomyelitis at L5-S1.  Patient went to [place].    Subjective   Patient seen and examined, sodium still low at 125.  Potassium is up to 5.4.   Assessment/Plan:    Lumbar osteomyelitis -Patient presented with worsening low back pain, MRI reportedly was diagnostic for osteomyelitis -imaging was performed at [facility], no information available in Care Everywhere -He was recently mated and was diagnosed with UTI from oxacillin resistant Staph epidermidis -Patient's wife brought MRI report on disc, was given to radiology in our hospital -Patient CRP and sed rate are normal -Neurosurgery consulted, no intervention recommended -IR consulted for aspiration -Biopsy done on 12/4 -IV antibiotics started per ID; continue  daptomycin -Continue pain control with Lidoderm, oxycodone, morphine as needed -Blood cultures are negative to date -Abscess cultures grew moderate Staph epidermidis; final susceptibility pending -ID has changed antibiotics to IV daptomycin 650 mg IV every 24 hours for 6 weeks, end date 08/20/2022  Chronic pain -Secondary to above -Patient has been getting oxycodone 5 mg every 6 hours as needed, pain not adequately  controlled -Will start MS Contin 15 mg p.o. twice daily  Hyponatremia -chronic, likely SIADH -Serum osmolality 277, urine sodium 101 -TSH was 0.797 in October - Discussed with nephrology, will change fluid restriction to 1200 ml/day -Tramadol was discontinued as this can cause hyponatremia -As patient has persistent chronic hyponatremia, will obtain official nephrology consultation  Mild hyperkalemia -Unclear etiology, patient potassium Rising Steadily -Nephrology consult   Dementia -No behavior disturbance -Continue Depakote, Namenda  Hypertension -Continue amlodipine, losartan  Hyperlipidemia -Continue rosuvastatin   Chronic diastolic CHF -Compensated at this time  BPH -Continue tamsulosin, Myrbetriq, finasteride  Glaucoma -Continue Travatan  Disposition-patient to go to skilled nursing facility after hyponatremia improves  Medications     acetaminophen  1,000 mg Oral TID   amLODipine  10 mg Oral Daily   aspirin  81 mg Oral Daily   Chlorhexidine Gluconate Cloth  6 each Topical Daily   divalproex  500 mg Oral QHS   enoxaparin (LOVENOX) injection  40 mg Subcutaneous Q24H   finasteride  5 mg Oral Daily   latanoprost  1 drop Both Eyes QHS   lidocaine  1 patch Transdermal Q24H   memantine  10 mg Oral BID   mirabegron ER  25 mg Oral Daily   morphine  15 mg Oral Q12H   polyethylene glycol  17 g Oral BID   rosuvastatin  20 mg Oral Daily   senna-docusate  1 tablet Oral BID   sodium chloride flush  10-40 mL Intracatheter Q12H   tamsulosin  0.4 mg Oral Daily     Data Reviewed:   CBG:  Recent Labs  Lab 07/12/22 1733  GLUCAP 136*    SpO2: 95 % O2 Flow Rate (L/min): 2 L/min    Vitals:   07/17/22 2240 07/18/22 0300 07/18/22 0846 07/18/22 1216  BP: 123/70  129/72 131/73  Pulse: 71  70 86  Resp: '17 20  17  '$ Temp: 97.7 F (36.5 C) 98.2 F (36.8 C) 98.2 F (36.8 C) 97.9 F (36.6 C)  TempSrc: Oral Oral Oral Oral  SpO2: 97% 97% 97% 95%  Weight:       Height:          Data Reviewed:  Basic Metabolic Panel: Recent Labs  Lab 07/13/22 0432 07/14/22 0510 07/15/22 1017 07/17/22 0617 07/18/22 0425  NA 128* 128* 127* 124* 125*  K 5.2* 4.7 4.4 4.9 5.4*  CL 93* 93* 92* 89* 90*  CO2 '27 26 25 26 27  '$ GLUCOSE 104* 95 122* 97 93  BUN '10 14 10 13 16  '$ CREATININE 1.10 1.13 1.00 1.07 1.14  CALCIUM 8.6* 8.7* 9.1 9.0 9.0    CBC: Recent Labs  Lab 07/12/22 0612 07/13/22 0432  WBC 7.2 5.7  HGB 11.6* 11.0*  HCT 32.7* 31.4*  MCV 78.0* 79.5*  PLT 289 275    LFT Recent Labs  Lab 07/13/22 0432 07/14/22 0510  AST 16 18  ALT 10 11  ALKPHOS 47 49  BILITOT 0.3 0.3  PROT 6.1* 6.3*  ALBUMIN 2.4* 2.6*     Antibiotics: Anti-infectives (From admission, onward)    Start     Dose/Rate Route Frequency Ordered Stop   07/10/22 2000  DAPTOmycin (CUBICIN) 650 mg in sodium chloride 0.9 % IVPB        8 mg/kg  82.6 kg 126 mL/hr over 30 Minutes Intravenous Daily 07/10/22 1654     07/10/22 1800  cefTRIAXone (ROCEPHIN) 2 g in sodium chloride 0.9 % 100 mL IVPB  Status:  Discontinued        2 g 200 mL/hr over 30 Minutes Intravenous Every 24 hours 07/10/22 1654 07/14/22 1256   07/07/22 2300  vancomycin (VANCOREADY) IVPB 1250 mg/250 mL  Status:  Discontinued        1,250 mg 166.7 mL/hr over 90 Minutes Intravenous Every 24 hours 07/06/22 2312 07/07/22 1150   07/07/22 0900  ceFEPIme (MAXIPIME) 2 g in sodium chloride 0.9 % 100 mL IVPB  Status:  Discontinued        2 g 200 mL/hr over 30 Minutes Intravenous Every 12 hours 07/06/22 2330 07/07/22 1150   07/06/22 2315  vancomycin (VANCOCIN) IVPB 1000 mg/200 mL premix  Status:  Discontinued        1,000 mg 200 mL/hr over 60 Minutes Intravenous  Once 07/06/22 2301 07/06/22 2303   07/06/22 2315  vancomycin (VANCOCIN) IVPB 1000 mg/200 mL premix        1,000 mg 200 mL/hr over 60 Minutes Intravenous Every hour 07/06/22 2303 07/07/22 0132   07/06/22 2100  ceFEPIme (MAXIPIME) 2 g in sodium chloride 0.9 % 100  mL IVPB        2 g 200 mL/hr over 30 Minutes Intravenous  Once 07/06/22 2059 07/06/22 2153        DVT prophylaxis: Lovenox  Code Status: Full code  Family Communication: No family at bedside   CONSULTS infectious disease   Objective    Physical Examination:  General-appears in no acute distress Heart-S1-S2, regular, no murmur auscultated Lungs-clear to auscultation bilaterally Abdomen-soft, nontender, no organomegaly Extremities-no edema in the lower extremities Neuro-alert, oriented x3, no focal deficit  noted   Status is: Inpatient:        [name]   [facility] If 7PM-7AM, please contact night-coverage at [email or web address], Office  [phone]   07/18/2022, 2:28 PM  LOS: 11 days

## 2022-07-18 NOTE — Consult Note (Signed)
[facility] Admit Date: 07/06/2022 07/18/2022 Rexene Agent Requesting Physician:  Darrick Meigs MD  Reason for Consult:  Hyponatremia HPI:  28M PMH including chronic HFpEF, dementia, hx/o CVA with L sided hemiparesis, HTN, HLD, BPH presented to ED 12/1 with back pain and recent MRI findings of lumbar OM.  Has [facility] admit 10/9 - 10/18 for MRSE UTI completed ABX and during that admission course was complicated by hyponatremia with nadir SNa 122.  Seen by nephrology, w/u for thyroid or adrenal disorders negative.  SNa eventually normalized with IVFs, but UNa was not low.    We are reconsulted for hyponatremia. Note SNa 132 at admission. Slowly it has decreased to 124 yesterday and 125 today.  On a 1.2L fluid restriction. No N/V.  NO change in cognitive status.  12/6 UOsm 443 and SOsm 277; 12/4 UOsm 344 and UNa 101.  No LEE.  Has had considerable pain.  K 5.4 this AM,  has been 5.5 to 5.2.    Has been found to have lumbart OM with biopsy culture of staph epi, to complete 6wk of dapto at guidance of [facility].    Today seen with wife at bedside and daughter joining by phone.  He has no c/o or needs.  Is eating everything on his plate.  Not on IVFs.  No SOB or CP.  2.7L UOP 2d again and 1L documented in.  1.8L yesterday and 0.5L documented in.  Meds include depakote, namenda, myrbetriqu, morphone SR, oxycodone, lidocaine patch  Sodium (mmol/L)  Date Value  07/18/2022 125 (L)  07/17/2022 124 (L)  07/15/2022 127 (L)  07/14/2022 128 (L)  07/13/2022 128 (L)  07/12/2022 127 (L)  07/11/2022 128 (L)  07/10/2022 127 (L)  07/09/2022 130 (L)  07/07/2022 132 (L)  07/06/2022 132 (L)  05/24/2022 142  05/23/2022 141  05/22/2022 137  05/21/2022 137  05/11/2022 122 (L)  12/24/2019 141  11/18/2019 145 (H)  ]  I/Os: I/O last 3 completed shifts: In: 790 [P.O.:420; I.V.:120; IV Piggyback:250] Out: 1275 [Urine:3180]   ROS Balance of 12 systems is negative w/ exceptions as above  PMH  Past Medical  History:  Diagnosis Date   BPH (benign prostatic hyperplasia)    Chronic diastolic (congestive) heart failure (HCC)    Dementia (HCC)    Glaucoma    Hemiparesis of left nondominant side (HCC)    History of shingles    HLD (hyperlipidemia)    Hypertension    Stroke ([place])    Tremor    PSH  Past Surgical History:  Procedure Laterality Date   COLONOSCOPY WITH PROPOFOL N/A 05/24/2022   Procedure: COLONOSCOPY WITH PROPOFOL;  Surgeon: Ladene Artist, MD;  Location: [facility];  Service: Gastroenterology;  Laterality: N/A;   IR LUMBAR [place] W/IMG GUIDE  07/10/2022   No past surgery     POLYPECTOMY  05/24/2022   Procedure: POLYPECTOMY;  Surgeon: Ladene Artist, MD;  Location: [facility] ENDOSCOPY;  Service: Gastroenterology;;   FH  Family History  Problem Relation Age of Onset   Heart attack Mother    Heart attack Father    [place]  reports that he quit smoking about 38 years ago. His smoking use included cigars. He has never used smokeless tobacco. He reports that he does not currently use alcohol. He reports that he does not use drugs. Allergies  Allergies  Allergen Reactions   Beef Allergy Other (See Comments)    Per patient's wife, outpatient GI removed a precancerous polyp and  advised patient should avoid chocolate.    Caffeine Other (See Comments)    Per patient's wife, outpatient GI removed a precancerous polyp and advised patient should avoid chocolate.    Chocolate Other (See Comments)    Per patient's wife, outpatient GI removed a precancerous polyp and advised patient should avoid chocolate.    Home medications Prior to Admission medications   Medication Sig Start Date End Date Taking? Authorizing Provider  acetaminophen (TYLENOL) 325 MG tablet Take 2 tablets (650 mg total) by mouth every 6 (six) hours as needed for mild pain, fever or headache. 05/24/22  Yes Elgergawy, Silver Huguenin, MD  amLODipine (NORVASC) 10 MG tablet Take 10 mg by mouth daily. 07/08/21  Yes [provider]  ASPIRIN 81 PO Take 81 mg by mouth daily.   Yes [provider]  Cholecalciferol (VITAMIN D3) 25 MCG (1000 UT) CAPS Take 1,000 Units by mouth daily.   Yes [provider]  divalproex (DEPAKOTE ER) 500 MG 24 hr tablet Take 1 tablet (500 mg total) by mouth at bedtime. 01/19/22  Yes [name], MD  docusate sodium (COLACE) 100 MG capsule Take 100 mg by mouth daily.   Yes [provider]  finasteride (PROSCAR) 5 MG tablet Take 5 mg by mouth daily. 08/24/21  Yes [provider]  GAVILAX 17 GM/SCOOP powder Take 17 g by mouth daily as needed for mild constipation. 04/26/22  Yes [provider]  losartan (COZAAR) 50 MG tablet Take 50 mg by mouth daily.   Yes [provider]  memantine (NAMENDA) 10 MG tablet Take 10 mg by mouth 2 (two) times daily. 02/08/22  Yes [provider]  MOVANTIK 25 MG TABS tablet Take 25 mg by mouth daily. 05/04/22  Yes [provider]  multivitamin-lutein (OCUVITE-LUTEIN) CAPS capsule Take 1 capsule by mouth daily.   Yes [provider]  MYRBETRIQ 25 MG TB24 tablet Take 25 mg by mouth daily. 11/02/21  Yes [provider]  rosuvastatin (CRESTOR) 20 MG tablet Take 20 mg by mouth daily.   Yes [provider]  senna (SENOKOT) 8.6 MG TABS tablet Take 1 tablet (8.6 mg total) by mouth daily. Patient taking differently: Take 1 tablet by mouth in the morning and at bedtime. 05/24/22  Yes Elgergawy, Silver Huguenin, MD  tamsulosin (FLOMAX) 0.4 MG CAPS capsule Take 0.4 mg by mouth daily. 10/29/20  Yes [provider]  traMADol (ULTRAM) 50 MG tablet Take 50 mg by mouth every 8 (eight) hours as needed for severe pain. 06/30/22  Yes [provider]  Travoprost, BAK Free, (TRAVATAN) 0.004 % SOLN ophthalmic solution Place 1 drop into both eyes at bedtime.   Yes [provider]    Current Medications Scheduled Meds:  acetaminophen  1,000 mg Oral TID   amLODipine  10 mg  Oral Daily   aspirin  81 mg Oral Daily   Chlorhexidine Gluconate Cloth  6 each Topical Daily   divalproex  500 mg Oral QHS   enoxaparin (LOVENOX) injection  40 mg Subcutaneous Q24H   finasteride  5 mg Oral Daily   latanoprost  1 drop Both Eyes QHS   lidocaine  1 patch Transdermal Q24H   memantine  10 mg Oral BID   mirabegron ER  25 mg Oral Daily   morphine  15 mg Oral Q12H   polyethylene glycol  17 g Oral BID   rosuvastatin  20 mg Oral Daily   senna-docusate  1 tablet Oral BID  sodium chloride flush  10-40 mL Intracatheter Q12H   tamsulosin  0.4 mg Oral Daily   Continuous Infusions:  sodium chloride 10 mL/hr at 07/16/22 1800   DAPTOmycin (CUBICIN) 650 mg in sodium chloride 0.9 % IVPB Stopped (07/17/22 2100)   PRN Meds:.bisacodyl, hydrALAZINE, morphine injection, ondansetron **OR** ondansetron (ZOFRAN) IV, mouth rinse, oxyCODONE, sodium chloride flush  CBC Recent Labs  Lab 07/12/22 0612 07/13/22 0432  WBC 7.2 5.7  HGB 11.6* 11.0*  HCT 32.7* 31.4*  MCV 78.0* 79.5*  PLT 289 240   Basic Metabolic Panel Recent Labs  Lab 07/12/22 0612 07/13/22 0432 07/14/22 0510 07/15/22 1017 07/17/22 0617 07/18/22 0425  NA 127* 128* 128* 127* 124* 125*  K 4.6 5.2* 4.7 4.4 4.9 5.4*  CL 95* 93* 93* 92* 89* 90*  CO2 '26 27 26 25 26 27  '$ GLUCOSE 110* 104* 95 122* 97 93  BUN '9 10 14 10 13 16  '$ CREATININE 1.01 1.10 1.13 1.00 1.07 1.14  CALCIUM 8.6* 8.6* 8.7* 9.1 9.0 9.0    Physical Exam  Blood pressure 131/73, pulse 86, temperature 97.9 F (36.6 C), temperature source Oral, resp. rate 17, height '5\' 7"'$  (1.702 m), weight 82.6 kg, SpO2 95 %. GEN: NAD, cordial, pleasant, wd/wn ENT: NCAT EYES: EOMI CV: RRR nl s1s2 PULM: CTAB, nl WOB, no wrr ABD: s/nt/nd SKIN: no rashes/lesions EXT:No LEE NOnfocal,   Assessment 74M with recurrent mild/mod hyponatremia.  Seems euvolemic.  SOsms 12/6 were low-normal.  With prev UNa and UOsm in clinical context of pain + infection most likely explanation  is SIADH.  Recent thyroid and adrenal testing were normal /reassuring.  Not overloaded.  Hypotonic Euvolemic Hyponatremia most c/w SIADH probably driven by pain + infection + meds + age.  Asymptomatic.  Doesn't require more rapid correction K of 5.4, borderline hyperkalemia Staph epi lumbar OM on dapto per [facility] / [facility] Dementia HTN, BPs ok Hx/o CVAs    Plan Cont 1.2L Fluid restriction Repeat SOsm, UNa, UOsm,  Serum uric acid No role for vaptan, salt tabs Cont to push solid foods Trend BMP Will follow along  Rexene Agent  [phone] 07/18/2022, 1:20 PM

## 2022-07-18 NOTE — Care Management Important Message (Signed)
Important Message  Patient Details  Name: Eric Barrett. MRN: [id number] Date of Birth: [birth year]   Medicare Important Message Given:  Yes     Hannah Beat 07/18/2022, 11:50 AM

## 2022-07-18 NOTE — Progress Notes (Signed)
Physical Therapy Treatment Patient Details Name: Eric Barrett. MRN: [id number] DOB: 1941/03/16 Today's Date: 07/18/2022   History of Present Illness Eric Barrett. is a 81 y.o. male  presenting with back pain, MRI showing osteo of the spine.  He was last hospitalized from 10/9-18 with acute on chronic hyponatremia and MRSE UTI.  He went to rehab and returned home on 11/27.   Pt with medical history significant of chronic diastolic CHF; glaucoma; dementia; CVA with L hemiparesis; HTN; and HLD    PT Comments    Pt continues with 10/10 pain and full body shaking when trying to stand despite efforts of coordination with RN and giving pt pain meds. Pt received IV morphine about 15 minutes prior to PT session in addition to his oxycodone an hour before. Discussed RN that pt may benefit from scheduled pain medicine vs PRN as pt with poor memory and doesn't understand/know to ask for pain medicine and goes all night without pain medicine. Acute PT to cont to follow.    Recommendations for follow up therapy are one component of a multi-disciplinary discharge planning process, led by the attending physician.  Recommendations may be updated based on patient status, additional functional criteria and insurance authorization.  Follow Up Recommendations  Skilled nursing-short term rehab (<3 hours/day) Can patient physically be transported by private vehicle: No   Assistance Recommended at Discharge Frequent or constant Supervision/Assistance  Patient can return home with the following A little help with walking and/or transfers;A lot of help with bathing/dressing/bathroom;Assistance with cooking/housework;Assistance with feeding;Assist for transportation;Help with stairs or ramp for entrance;Direct supervision/assist for medications management;Direct supervision/assist for financial management   Equipment Recommendations  None recommended by PT    Recommendations for Other Services        Precautions / Restrictions Precautions Precautions: Fall;Back Precaution Comments: back precautions for comfort Restrictions Weight Bearing Restrictions: No     Mobility  Bed Mobility Overal bed mobility: Needs Assistance Bed Mobility: Rolling, Sidelying to Sit, Sit to Sidelying Rolling: Min assist Sidelying to sit: Min assist     Sit to sidelying: Min assist General bed mobility comments: cues for log roll; technique, minA for trunk elevation and LE management back into bed    Transfers Overall transfer level: Needs assistance Equipment used: 2 person hand held assist (face to face transfer with gait belt) Transfers: Sit to/from Stand Sit to Stand: Max assist, +2 physical assistance           General transfer comment: maxA to power up x 3, pt very shaky and unable to achieve full upright posture, pt reports 10/10 back pain, unable to take a step    Ambulation/Gait               General Gait Details: unable to ambulate this date due to shakiness and 10/10 pain, pt unable to tolerate upright posture, attempted to transfer to [facility] x3 however unsafe   Stairs             Wheelchair Mobility    Modified Rankin (Stroke Patients Only)       Balance Overall balance assessment: History of Falls, Needs assistance Sitting-balance support: No upper extremity supported, Feet supported Sitting balance-Leahy Scale: Good     Standing balance support: Bilateral upper extremity supported, Reliant on assistive device for balance Standing balance-Leahy Scale: Poor Standing balance comment: unable to tolerate standing  Cognition Arousal/Alertness: Awake/alert Behavior During Therapy: WFL for tasks assessed/performed Overall Cognitive Status: History of cognitive impairments - at baseline                                 General Comments: pt admits to having a poor memory, pt isn't asking for pain medicine because  he doens't remember too        Exercises      General Comments General comments (skin integrity, edema, etc.): VSS on RA, assisted pt with getting on bed pan      Pertinent Vitals/Pain Pain Assessment Pain Assessment: 0-10 Pain Score: 10-Worst pain ever Pain Location: back Pain Descriptors / Indicators: Grimacing (full body shaking)    Home Living                          Prior Function            PT Goals (current goals can now be found in the care plan section) Acute Rehab PT Goals Patient Stated Goal: stop the pain PT Goal Formulation: With patient/family Time For Goal Achievement: 07/24/22 Potential to Achieve Goals: Good Progress towards PT goals: Not progressing toward goals - comment    Frequency    Min 3X/week      PT Plan Current plan remains appropriate    Co-evaluation              AM-PAC PT "6 Clicks" Mobility   Outcome Measure  Help needed turning from your back to your side while in a flat bed without using bedrails?: A Little Help needed moving from lying on your back to sitting on the side of a flat bed without using bedrails?: A Lot Help needed moving to and from a bed to a chair (including a wheelchair)?: A Lot Help needed standing up from a chair using your arms (e.g., wheelchair or bedside chair)?: A Lot Help needed to walk in hospital room?: Total Help needed climbing 3-5 steps with a railing? : Total 6 Click Score: 11    End of Session Equipment Utilized During Treatment: Gait belt Activity Tolerance: Patient limited by pain Patient left: in bed;with call bell/phone within reach;with bed alarm set (on bed pan, RN tech and RN notified) Nurse Communication: Mobility status PT Visit Diagnosis: Other abnormalities of gait and mobility (R26.89);Difficulty in walking, not elsewhere classified (R26.2);Pain;History of falling (Z91.81);Muscle weakness (generalized) (M62.81) Pain - part of body:  (back)     Time:  2751-7001 PT Time Calculation (min) (ACUTE ONLY): 17 min  Charges:  $Therapeutic Activity: 8-22 mins                     Kittie Plater, PT, DPT Acute Rehabilitation Services Secure chat preferred Office #: [phone]    Berline Lopes 07/18/2022, 10:28 AM

## 2022-07-19 DIAGNOSIS — M4626 Osteomyelitis of vertebra, lumbar region: Secondary | ICD-10-CM | POA: Diagnosis not present

## 2022-07-19 LAB — OSMOLALITY, URINE: Osmolality, Ur: 504 mOsm/kg (ref 300–900)

## 2022-07-19 LAB — BASIC METABOLIC PANEL
Anion gap: 9 (ref 5–15)
BUN: 21 mg/dL (ref 8–23)
CO2: 28 mmol/L (ref 22–32)
Calcium: 9.1 mg/dL (ref 8.9–10.3)
Chloride: 87 mmol/L — ABNORMAL LOW (ref 98–111)
Creatinine, Ser: 1.4 mg/dL — ABNORMAL HIGH (ref 0.61–1.24)
GFR, Estimated: 50 mL/min — ABNORMAL LOW (ref >60)
Glucose, Bld: 144 mg/dL — ABNORMAL HIGH (ref 70–99)
Potassium: 4.7 mmol/L (ref 3.5–5.1)
Sodium: 124 mmol/L — ABNORMAL LOW (ref 135–145)

## 2022-07-19 LAB — OSMOLALITY: Osmolality: 275 mOsm/kg (ref 275–295)

## 2022-07-19 MED ORDER — UREA 15 G PO PACK
15.0000 g | PACK | Freq: Two times a day (BID) | ORAL | Status: DC
  Administered 2022-07-19 – 2022-07-26 (×15): 15 g via ORAL
  Filled 2022-07-19 (×16): qty 1

## 2022-07-19 NOTE — Progress Notes (Signed)
Occupational Therapy Treatment Patient Details Name: Eric Barrett. MRN: [id number] DOB: 04-Sep-1940 Today's Date: 07/19/2022   History of present illness Eric Barrett. is a 81 y.o. male  presenting with back pain, MRI showing osteo of the spine.  He was last hospitalized from 10/9-18 with acute on chronic hyponatremia and MRSE UTI.  He went to rehab and returned home on 11/27.   Pt with medical history significant of chronic diastolic CHF; glaucoma; dementia; CVA with L hemiparesis; HTN; and HLD   OT comments  Pt progressing with adls and adl goals this am. Pt has been on a more consistent schedule to receive pain meds and was more tolerant of activity today. Pt ambulated to bathroom with min assist at times to max assist (when tremors were at their worst) to groom. Pt stood at sink with min guard to mod assist if tremoring.  Spoke with pt about taking some deep breaths and trying to calm self when on his feet as this seems to make tremors better.  Pt most limited by the shaking when up ambulating and this makes pt a significant fall risk.  Will continue to see with focus on functional adls in standing.    Recommendations for follow up therapy are one component of a multi-disciplinary discharge planning process, led by the attending physician.  Recommendations may be updated based on patient status, additional functional criteria and insurance authorization.    Follow Up Recommendations  Skilled nursing-short term rehab (<3 hours/day)     Assistance Recommended at Discharge Frequent or constant Supervision/Assistance  Patient can return home with the following  A lot of help with walking and/or transfers;A lot of help with bathing/dressing/bathroom;Assistance with cooking/housework;Assist for transportation;Help with stairs or ramp for entrance   Equipment Recommendations  None recommended by OT    Recommendations for Other Services      Precautions / Restrictions  Precautions Precautions: Fall;Back Precaution Booklet Issued: Yes (comment) Precaution Comments: back precautions for comfort Restrictions Weight Bearing Restrictions: No       Mobility Bed Mobility Overal bed mobility: Needs Assistance Bed Mobility: Supine to Sit Rolling: Min assist         General bed mobility comments: verbal cues for sequencing    Transfers Overall transfer level: Needs assistance Equipment used: Rolling walker (2 wheels) Transfers: Sit to/from Stand Sit to Stand: Min assist           General transfer comment: Cues for hand placement     Balance Overall balance assessment: Needs assistance Sitting-balance support: No upper extremity supported, Feet supported Sitting balance-Leahy Scale: Fair     Standing balance support: Single extremity supported, Bilateral upper extremity supported, Reliant on assistive device for balance Standing balance-Leahy Scale: Poor Standing balance comment: minA with BUE support of RW                           ADL either performed or assessed with clinical judgement   ADL Overall ADL's : Needs assistance/impaired Eating/Feeding: Independent;Sitting Eating/Feeding Details (indicate cue type and reason): Pt able to open  small containers and cut food/feed self with no tremors or assist. Grooming: Wash/dry hands;Minimal assistance;Standing Grooming Details (indicate cue type and reason): Pt stood at sink to groom. At first, pt with significant tremors of all four extremities when standing. Once cued to lean on sink and take a deep breath to calm self, pt able to wash hands with min guard for balance  and in prep for tremors to start. Tremors, at times, are so violent, pt needs mod to max assist to sit and regroup.             Lower Body Dressing: Moderate assistance;Sit to/from stand;Cueing for compensatory techniques Lower Body Dressing Details (indicate cue type and reason): Pt crossed legs in figure 4  position to doff socks. Pt with mild posterior lean during task requiring min assist to recover. No pain reported with this movement. Toilet Transfer: Moderate assistance;Stand-pivot;Rolling walker (2 wheels);BSC/3in1 Toilet Transfer Details (indicate cue type and reason): pt tremoring when first standing but once calmed, pt able to pivot without incident. Feel tremors may have an anxiety component as when pt asked to stop moving and relax/deep breath, the tremors tend to dissapate.         Functional mobility during ADLs: Cueing for safety;Rolling walker (2 wheels);+2 for physical assistance;Moderate assistance General ADL Comments: pt most limited by tremors when up on feet and walking/standing.    Extremity/Trunk Assessment Upper Extremity Assessment Upper Extremity Assessment: Generalized weakness LUE Deficits / Details: weaker than R but WFL for functional tasks LUE Coordination: decreased fine motor   Lower Extremity Assessment Lower Extremity Assessment: Defer to PT evaluation        Vision   Vision Assessment?: No apparent visual deficits   Perception     Praxis      Cognition Arousal/Alertness: Awake/alert Behavior During Therapy: WFL for tasks assessed/performed Overall Cognitive Status: History of cognitive impairments - at baseline                                 General Comments: pt is oriented x4, poor recall of back precautions        Exercises      Shoulder Instructions       General Comments Pt more active today and tolerating walking and grooming in the bathroom.    Pertinent Vitals/ Pain       Pain Assessment Pain Assessment: Faces Faces Pain Scale: Hurts little more Pain Location: back Pain Descriptors / Indicators: Sore Pain Intervention(s): Patient requesting pain meds-RN notified, Limited activity within patient's tolerance, Monitored during session, Premedicated before session, Repositioned  Home Living                                           Prior Functioning/Environment              Frequency  Min 2X/week        Progress Toward Goals  OT Goals(current goals can now be found in the care plan section)  Progress towards OT goals: Progressing toward goals  Acute Rehab OT Goals Patient Stated Goal: to get steadier on my feet OT Goal Formulation: With patient Time For Goal Achievement: 07/23/22 Potential to Achieve Goals: Good ADL Goals Pt Will Perform Grooming: with supervision;standing Pt Will Perform Lower Body Dressing: with min assist;sit to/from stand Pt Will Transfer to Toilet: with min guard assist;ambulating Additional ADL Goal #1: Pt will indep complete bed mobility as a precursor to ADLs  Plan Discharge plan remains appropriate    Co-evaluation    PT/OT/SLP Co-Evaluation/Treatment: Yes Reason for Co-Treatment: For patient/therapist safety PT goals addressed during session: Mobility/safety with mobility OT goals addressed during session: ADL's and self-care      AM-PAC  OT "6 Clicks" Daily Activity     Outcome Measure   Help from another person eating meals?: None Help from another person taking care of personal grooming?: A Little Help from another person toileting, which includes using toliet, bedpan, or urinal?: A Lot Help from another person bathing (including washing, rinsing, drying)?: A Lot Help from another person to put on and taking off regular upper body clothing?: A Little Help from another person to put on and taking off regular lower body clothing?: A Lot 6 Click Score: 16    End of Session Equipment Utilized During Treatment: Rolling walker (2 wheels);Gait belt  OT Visit Diagnosis: Unsteadiness on feet (R26.81);Muscle weakness (generalized) (M62.81);Other abnormalities of gait and mobility (R26.89)   Activity Tolerance Patient tolerated treatment well   Patient Left in chair;with call bell/phone within reach;with chair alarm set    Nurse Communication Mobility status        Time: 2353-6144 OT Time Calculation (min): 28 min  Charges: OT General Charges $OT Visit: 1 Visit OT Treatments $Self Care/Home Management : 8-22 mins   Glenford Peers 07/19/2022, 10:15 AM

## 2022-07-19 NOTE — Progress Notes (Signed)
Mobility Specialist Progress Note   07/19/22 1607  Mobility  Activity Transferred from chair to bed  Level of Assistance Moderate assist, patient does 50-74%  Assistive Device Front wheel walker  Distance Ambulated (ft) 2 ft  Activity Response Tolerated fair  $Mobility charge 1 Mobility   Pt received in chair requesting to get back to bed. MinA to stand but tremors increasing in intensity and requiring modA to pivot. No faults but pt expressing fatigue in LE's. Pt requesting bed pan for potential BM, left on bed pan w/ call bell in reach, bed alarm on and RN notified.  [place] Falling Mobility Specialist Please contact via SecureChat or  Rehab office at [phone]

## 2022-07-19 NOTE — Progress Notes (Signed)
Physical Therapy Treatment Patient Details Name: Eric Barrett. MRN: [id number] DOB: [birth year] Today's Date: 07/19/2022   History of Present Illness Eric Barrett. is a 81 y.o. male  presenting with back pain, MRI showing osteo of the spine.  He was last hospitalized from 10/9-18 with acute on chronic hyponatremia and MRSE UTI.  He went to rehab and returned home on 11/27.   Pt with medical history significant of chronic diastolic CHF; glaucoma; dementia; CVA with L hemiparesis; HTN; and HLD    PT Comments    Pt tolerates treatment well, with less significant reports of pain. Pt continues to have significant tremors throughout body which are present with large body movements and gross motor activities (bed mobility, transfers, ambulation). Pt does report a significant fear of falling and tremors seem to reduce in intensity with cues to control breathing. Pt will benefit from continued aggressive mobilization in an effort to reduce falls risk and caregiver burden. PT continues to recommend SNF placement.   Recommendations for follow up therapy are one component of a multi-disciplinary discharge planning process, led by the attending physician.  Recommendations may be updated based on patient status, additional functional criteria and insurance authorization.  Follow Up Recommendations  Skilled nursing-short term rehab (<3 hours/day) Can patient physically be transported by private vehicle: No   Assistance Recommended at Discharge Frequent or constant Supervision/Assistance  Patient can return home with the following A little help with walking and/or transfers;A lot of help with bathing/dressing/bathroom;Assistance with cooking/housework;Assistance with feeding;Assist for transportation;Help with stairs or ramp for entrance;Direct supervision/assist for medications management;Direct supervision/assist for financial management   Equipment Recommendations  None recommended by PT     Recommendations for Other Services       Precautions / Restrictions Precautions Precautions: Fall;Back Precaution Booklet Issued: Yes (comment) Precaution Comments: back precautions for comfort Restrictions Weight Bearing Restrictions: No     Mobility  Bed Mobility Overal bed mobility: Needs Assistance Bed Mobility: Supine to Sit     Supine to sit: Min assist, +2 for physical assistance, HOB elevated     General bed mobility comments: verbal cues for sequencing    Transfers Overall transfer level: Needs assistance Equipment used: Rolling walker (2 wheels) Transfers: Sit to/from Stand Sit to Stand: Min assist           General transfer comment: verbal cues for hand placement. Pt performs 5 sit to stand transfers during session    Ambulation/Gait Ambulation/Gait assistance: Mod assist Gait Distance (Feet): 10 Feet (10' x 2) Assistive device: Rolling walker (2 wheels) Gait Pattern/deviations: Step-to pattern, Ataxic Gait velocity: reduced Gait velocity interpretation: <1.31 ft/sec, indicative of household ambulator   General Gait Details: pt with whole body tremors when ambulating and standing. PT provides cues for breathing and to stop ambulation when tremors become more significant in an effort to improve stability. Pt requires assistance for support due to instability created from tremors.   Stairs             Wheelchair Mobility    Modified Rankin (Stroke Patients Only)       Balance Overall balance assessment: Needs assistance Sitting-balance support: No upper extremity supported, Feet supported Sitting balance-Leahy Scale: Fair     Standing balance support: Single extremity supported, Bilateral upper extremity supported, Reliant on assistive device for balance Standing balance-Leahy Scale: Poor Standing balance comment: minA with BUE support of RW  Cognition Arousal/Alertness: Awake/alert Behavior  During Therapy: WFL for tasks assessed/performed Overall Cognitive Status: History of cognitive impairments - at baseline                                 General Comments: pt is oriented x4, poor recall of back precautions        Exercises Other Exercises Other Exercises: LE coordination exercise, tapping foot on different floor tiles to improve coordination    General Comments General comments (skin integrity, edema, etc.): VSS on RA, pt tremors seem to reduce in intensity with cues to breathe and to stop mobility      Pertinent Vitals/Pain Pain Assessment Pain Assessment: Faces Faces Pain Scale: Hurts little more Pain Location: back Pain Descriptors / Indicators: Grimacing Pain Intervention(s): Monitored during session    Home Living                          Prior Function            PT Goals (current goals can now be found in the care plan section) Acute Rehab PT Goals Patient Stated Goal: stop the pain Progress towards PT goals: Progressing toward goals    Frequency    Min 3X/week      PT Plan Current plan remains appropriate    Co-evaluation              AM-PAC PT "6 Clicks" Mobility   Outcome Measure  Help needed turning from your back to your side while in a flat bed without using bedrails?: A Little Help needed moving from lying on your back to sitting on the side of a flat bed without using bedrails?: A Little Help needed moving to and from a bed to a chair (including a wheelchair)?: A Little Help needed standing up from a chair using your arms (e.g., wheelchair or bedside chair)?: A Little Help needed to walk in hospital room?: A Lot Help needed climbing 3-5 steps with a railing? : Total 6 Click Score: 15    End of Session Equipment Utilized During Treatment: Gait belt Activity Tolerance: Patient tolerated treatment well Patient left: in chair;with call bell/phone within reach;with chair alarm set Nurse  Communication: Mobility status;Need for lift equipment (STEDY) PT Visit Diagnosis: Other abnormalities of gait and mobility (R26.89);Difficulty in walking, not elsewhere classified (R26.2);Pain;History of falling (Z91.81);Muscle weakness (generalized) (M62.81) Pain - part of body:  (back)     Time: 7741-2878 PT Time Calculation (min) (ACUTE ONLY): 23 min  Charges:  $Therapeutic Activity: 8-22 mins                     Zenaida Niece, PT, DPT Acute Rehabilitation Office [phone]    Zenaida Niece 07/19/2022, 9:14 AM

## 2022-07-19 NOTE — Progress Notes (Signed)
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  Subjective:  UOsm around 500, UNa 100, consistent with SIADH SNa still 124 1.8L UOP in past 24h Uric acid ws < 4 No AMS, good PO   12/12 0701 - 12/13 0700 In: 580 [P.O.:580] Out: 1802 [Urine:1800; Stool:2]  Filed Weights   07/06/22 1958  Weight: 82.6 kg    Scheduled Meds:  acetaminophen  1,000 mg Oral TID   amLODipine  10 mg Oral Daily   aspirin  81 mg Oral Daily   Chlorhexidine Gluconate Cloth  6 each Topical Daily   divalproex  500 mg Oral QHS   enoxaparin (LOVENOX) injection  40 mg Subcutaneous Q24H   finasteride  5 mg Oral Daily   latanoprost  1 drop Both Eyes QHS   lidocaine  1 patch Transdermal Q24H   memantine  10 mg Oral BID   mirabegron ER  25 mg Oral Daily   morphine  15 mg Oral Q12H   polyethylene glycol  17 g Oral BID   rosuvastatin  20 mg Oral Daily   senna-docusate  1 tablet Oral BID   sodium chloride flush  10-40 mL Intracatheter Q12H   tamsulosin  0.4 mg Oral Daily   urea  15 g Oral BID   Continuous Infusions:  sodium chloride 10 mL/hr at 07/16/22 1800   DAPTOmycin (CUBICIN) 650 mg in sodium chloride 0.9 % IVPB Stopped (07/18/22 2140)   PRN Meds:.bisacodyl, hydrALAZINE, morphine injection, ondansetron **OR** ondansetron (ZOFRAN) IV, mouth rinse, oxyCODONE, sodium chloride flush  Current Labs: reviewed    Physical Exam:  Blood pressure 131/68, pulse 74, temperature 98.5 F (36.9 C), temperature source Oral, resp. rate 17, height '5\' 7"'$  (1.702 m), weight 82.6 kg, SpO2 96 %. GEN: NAD, cordial, pleasant, wd/wn ENT: NCAT EYES: EOMI CV: RRR nl s1s2 PULM: CTAB, nl WOB, no wrr ABD: s/nt/nd SKIN: no rashes/lesions EXT:No LEE NOnfocal  A Hypotonic Euvolemic Hyponatremia most c/w SIADH probably driven by pain + infection + meds + age.  Asymptomatic.  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 Morphine should not be used.  Baclofen should be avoided Avoid oral sodium phosphate and magnesium citrate based laxatives / bowel preps    Pearson Grippe MD 07/19/2022, 1:40 PM  Recent Labs  Lab 07/17/22 0617 07/18/22 0425 07/18/22 1332 07/19/22 1126  NA 124* 125* 125* 124*  K 4.9 5.4* 4.8 4.7  CL 89* 90*  --  87*  CO2 26 27  --  28  GLUCOSE 97 93  --  144*  BUN 13 16  --  21  CREATININE 1.07 1.14  --  1.40*  CALCIUM 9.0 9.0  --  9.1   Recent Labs  Lab 07/13/22 0432  WBC 5.7  HGB 11.0*  HCT 31.4*  MCV 79.5*  PLT 275

## 2022-07-19 NOTE — Progress Notes (Signed)
[facility]  PROGRESS NOTE  Eric Barrett. GYI:[id number] DOB: 1940/08/17 DOA: 07/06/2022 PCP: [name], FNP   Brief HPI:   Eric Barrett. is a 81 y.o. male with medical history significant of chronic diastolic CHF; glaucoma; dementia; CVA with L hemiparesis; HTN; and HLD presenting with back pain, MRI showing osteo of the spine.  He was last hospitalized from 10/9-18 with acute on chronic hyponatremia and MRSE UTI.  He went to rehab and returned home on 11/27.  He was experiencing some "regression" - not doing as well as he was in rehab.  He is getting home PT.  Due to the severe pain in his back, he had trouble walking, leg and arm tremor.  He has not had chronic back pain, started before last hospitalization.  He wasn't walking well starting in about August but did not really complain.  He started complaining of pain while in rehab, in the lumbar region.  He was having such severe pain that he wasn't really able to get around at home.  He was using a walker and then a wheelchair.   He had an MRI 2 days ago at [facility].   Their PCP got the result yesterday ("infection in the back", recommended IV antibiotics, "osteomyelitis at LS-S1") and told them to take him to [place].     Subjective   Pain is better in his back today-- sitting in chair   Assessment/Plan:    Lumbar osteomyelitis -Patient presented with worsening low back pain, MRI reportedly was diagnostic for osteomyelitis -imaging was performed at [facility], no information available in Care Everywhere -He was recently mated and was diagnosed with UTI from oxacillin resistant Staph epidermidis -Patient's wife brought MRI report on disc, was given to radiology in our hospital -Patient CRP and sed rate are normal -Neurosurgery consulted, no intervention recommended -IR consulted for aspiration -Biopsy done on 12/4 -IV antibiotics started per ID; continue  daptomycin -Blood cultures are negative to  date -Abscess cultures grew moderate Staph epidermidis -ID has changed antibiotics to IV daptomycin 650 mg IV every 24 hours for 6 weeks, end date 08/20/2022  Chronic pain -Secondary to above -Patient has been getting oxycodone 5 mg every 6 hours as needed, pain not adequately controlled -Will start MS Contin 15 mg p.o. twice daily  Hyponatremia -chronic, likely SIADH -Serum osmolality 277, urine sodium 101 -TSH was 0.797 in October -  fluid restriction to 1200 ml/day -Tramadol was discontinued as this can cause hyponatremia -no labs today thus far  Mild hyperkalemia -resolved  Dementia -No behavior disturbance -Continue Depakote, Namenda  Hypertension -Continue amlodipine, losartan  Hyperlipidemia -Continue rosuvastatin   Chronic diastolic CHF -Compensated at this time  BPH -Continue tamsulosin, Myrbetriq, finasteride  Glaucoma -Continue Travatan  Disposition-patient to go to skilled nursing facility after hyponatremia improves  Medications     acetaminophen  1,000 mg Oral TID   amLODipine  10 mg Oral Daily   aspirin  81 mg Oral Daily   Chlorhexidine Gluconate Cloth  6 each Topical Daily   divalproex  500 mg Oral QHS   enoxaparin (LOVENOX) injection  40 mg Subcutaneous Q24H   finasteride  5 mg Oral Daily   latanoprost  1 drop Both Eyes QHS   lidocaine  1 patch Transdermal Q24H   memantine  10 mg Oral BID   mirabegron ER  25 mg Oral Daily   morphine  15 mg Oral Q12H   polyethylene glycol  17 g Oral BID  rosuvastatin  20 mg Oral Daily   senna-docusate  1 tablet Oral BID   sodium chloride flush  10-40 mL Intracatheter Q12H   tamsulosin  0.4 mg Oral Daily     Data Reviewed:   CBG:  Recent Labs  Lab 07/12/22 1733  GLUCAP 136*    SpO2: 96 % O2 Flow Rate (L/min): 2 L/min    Vitals:   07/18/22 1910 07/18/22 2325 07/19/22 0301 07/19/22 0819  BP: 133/77  115/64 (!) 140/73  Pulse: 85 88 97 79  Resp: '19 18 16 18  '$ Temp: 97.7 F (36.5 C) 98.4 F  (36.9 C) 98.4 F (36.9 C) 98.6 F (37 C)  TempSrc: Oral Oral Oral Oral  SpO2: 94% 99% 95% 96%  Weight:      Height:          Data Reviewed:  Basic Metabolic Panel: Recent Labs  Lab 07/13/22 0432 07/14/22 0510 07/15/22 1017 07/17/22 0617 07/18/22 0425 07/18/22 1332  NA 128* 128* 127* 124* 125* 125*  K 5.2* 4.7 4.4 4.9 5.4* 4.8  CL 93* 93* 92* 89* 90*  --   CO2 '27 26 25 26 27  '$ --   GLUCOSE 104* 95 122* 97 93  --   BUN '10 14 10 13 16  '$ --   CREATININE 1.10 1.13 1.00 1.07 1.14  --   CALCIUM 8.6* 8.7* 9.1 9.0 9.0  --     CBC: Recent Labs  Lab 07/13/22 0432  WBC 5.7  HGB 11.0*  HCT 31.4*  MCV 79.5*  PLT 275    LFT Recent Labs  Lab 07/13/22 0432 07/14/22 0510  AST 16 18  ALT 10 11  ALKPHOS 47 49  BILITOT 0.3 0.3  PROT 6.1* 6.3*  ALBUMIN 2.4* 2.6*     Antibiotics: Anti-infectives (From admission, onward)    Start     Dose/Rate Route Frequency Ordered Stop   07/10/22 2000  DAPTOmycin (CUBICIN) 650 mg in sodium chloride 0.9 % IVPB        8 mg/kg  82.6 kg 126 mL/hr over 30 Minutes Intravenous Daily 07/10/22 1654     07/10/22 1800  cefTRIAXone (ROCEPHIN) 2 g in sodium chloride 0.9 % 100 mL IVPB  Status:  Discontinued        2 g 200 mL/hr over 30 Minutes Intravenous Every 24 hours 07/10/22 1654 07/14/22 1256   07/07/22 2300  vancomycin (VANCOREADY) IVPB 1250 mg/250 mL  Status:  Discontinued        1,250 mg 166.7 mL/hr over 90 Minutes Intravenous Every 24 hours 07/06/22 2312 07/07/22 1150   07/07/22 0900  ceFEPIme (MAXIPIME) 2 g in sodium chloride 0.9 % 100 mL IVPB  Status:  Discontinued        2 g 200 mL/hr over 30 Minutes Intravenous Every 12 hours 07/06/22 2330 07/07/22 1150   07/06/22 2315  vancomycin (VANCOCIN) IVPB 1000 mg/200 mL premix  Status:  Discontinued        1,000 mg 200 mL/hr over 60 Minutes Intravenous  Once 07/06/22 2301 07/06/22 2303   07/06/22 2315  vancomycin (VANCOCIN) IVPB 1000 mg/200 mL premix        1,000 mg 200 mL/hr over 60  Minutes Intravenous Every hour 07/06/22 2303 07/07/22 0132   07/06/22 2100  ceFEPIme (MAXIPIME) 2 g in sodium chloride 0.9 % 100 mL IVPB        2 g 200 mL/hr over 30 Minutes Intravenous  Once 07/06/22 2059 07/06/22 2153  DVT prophylaxis: Lovenox  Code Status: Full code  Family Communication: No family at bedside   CONSULTS infectious disease   Objective    Physical Examination:   General: Appearance:     Overweight male in no acute distress     Lungs:     Clear to auscultation bilaterally, respirations unlabored  Heart:    Normal heart rate. Normal rhythm. No murmurs, rubs, or gallops.   MS:   All extremities are intact.   Neurologic:   Awake, alert      Status is: Inpatient:        Eric Barrett   [facility] If 7PM-7AM, please contact night-coverage at [email or web address], Office  [phone]   07/19/2022, 11:26 AM  LOS: 12 days

## 2022-07-19 NOTE — Progress Notes (Signed)
Mobility Specialist Progress Note   07/19/22 1446  Mobility  Activity Transferred from bed to chair  Level of Assistance Moderate assist, patient does 50-74%  Assistive Device Front wheel walker  Distance Ambulated (ft) 2 ft  Activity Response Tolerated well  $Mobility charge 1 Mobility   Pt received in bed c/o uncontrollable shakes in the hand but agreeable  to eat lunch sitting up. Stand by assist for bed mobility but ModA to stand d/t full body tremors. Pt able to stop and regain self for gait progression but tremors persistent throughout transfer. In chair w/o fault, call bell in reach and chair alarm on.   Holland Falling Mobility Specialist Please contact via SecureChat or  Rehab office at [phone]

## 2022-07-19 NOTE — TOC Progression Note (Signed)
Transition of Care (TOC) - Progression Note    Patient Details  Name: Eric Barrett. MRN: [id number] Date of Birth: [birth year]  Transition of [facility]) CM/SW Contact  Vinie Sill, [place] Phone Number: 07/19/2022, 2:13 PM  Clinical Narrative:     TOC is following for medical readiness.   Patient has Josem Kaufmann for [facility] 12/11-12/17   TOC will continue to follow and assist with discharge planning.   Expected Discharge Plan: [place] Barriers to Discharge: Continued Medical Work up  Expected Discharge Plan and Services Expected Discharge Plan: [place] In-house Referral: Clinical Social Work Discharge Planning Services: CM Consult Post Acute Care Choice: [place] Living arrangements for the past 2 months: [place] Determinants of Health (SDOH) Interventions    Readmission Risk Interventions    05/17/2022    4:04 PM  Readmission Risk Prevention Plan  Transportation Screening Complete  PCP or Specialist Appt within 5-7 Days Complete  Home Care Screening Complete  Medication Review (RN CM) Complete

## 2022-07-20 LAB — BASIC METABOLIC PANEL
Anion gap: 8 (ref 5–15)
BUN: 33 mg/dL — ABNORMAL HIGH (ref 8–23)
CO2: 29 mmol/L (ref 22–32)
Calcium: 9.4 mg/dL (ref 8.9–10.3)
Chloride: 90 mmol/L — ABNORMAL LOW (ref 98–111)
Creatinine, Ser: 1.28 mg/dL — ABNORMAL HIGH (ref 0.61–1.24)
GFR, Estimated: 56 mL/min — ABNORMAL LOW (ref >60)
Glucose, Bld: 153 mg/dL — ABNORMAL HIGH (ref 70–99)
Potassium: 4.7 mmol/L (ref 3.5–5.1)
Sodium: 127 mmol/L — ABNORMAL LOW (ref 135–145)

## 2022-07-20 NOTE — Progress Notes (Signed)
Admit: 07/06/2022 LOS: 13  11M moderate ASx hyponatremia likely SIADH  Subjective:  No interval events Good UOP AM Labs pending Has rec Urea  12/13 0701 - 12/14 0700 In: 960 [P.O.:960] Out: 2200 [Urine:2200]  Filed Weights   07/06/22 1958  Weight: 82.6 kg    Scheduled Meds:  acetaminophen  1,000 mg Oral TID   amLODipine  10 mg Oral Daily   aspirin  81 mg Oral Daily   Chlorhexidine Gluconate Cloth  6 each Topical Daily   divalproex  500 mg Oral QHS   enoxaparin (LOVENOX) injection  40 mg Subcutaneous Q24H   finasteride  5 mg Oral Daily   latanoprost  1 drop Both Eyes QHS   lidocaine  1 patch Transdermal Q24H   memantine  10 mg Oral BID   mirabegron ER  25 mg Oral Daily   morphine  15 mg Oral Q12H   polyethylene glycol  17 g Oral BID   rosuvastatin  20 mg Oral Daily   senna-docusate  1 tablet Oral BID   sodium chloride flush  10-40 mL Intracatheter Q12H   tamsulosin  0.4 mg Oral Daily   urea  15 g Oral BID   Continuous Infusions:  sodium chloride 10 mL/hr at 07/16/22 1800   DAPTOmycin (CUBICIN) 650 mg in sodium chloride 0.9 % IVPB 650 mg (07/19/22 2109)   PRN Meds:.bisacodyl, hydrALAZINE, morphine injection, ondansetron **OR** ondansetron (ZOFRAN) IV, mouth rinse, oxyCODONE, sodium chloride flush  Current Labs: reviewed   Latest Reference Range & Units 07/18/22 15:05  Osmolality, Urine 300 - 900 mOsm/kg 504  Sodium, Urine mmol/L 109    Latest Reference Range & Units 07/19/22 11:28  Osmolality 275 - 295 mOsm/kg 275   Physical Exam:  Blood pressure 138/76, pulse 81, temperature 98.6 F (37 C), resp. rate 18, height '5\' 7"'$  (1.702 m), weight 82.6 kg, SpO2 94 %. GEN: NAD, cordial, pleasant, wd/wn; AAO x3 (self, location,  year/month) ENT: NCAT EYES: EOMI CV: RRR nl s1s2 PULM: CTAB, nl WOB, no wrr ABD: s/nt/nd SKIN: no rashes/lesions EXT:No LEE NOnfocal  A Hypotonic Euvolemic Hyponatremia most c/w SIADH probably driven by pain + infection + meds + age.   Asymptomatic.  Doesn't require more rapid correction Staph epi lumbar OM on dapto per TRH / [facility] Dementia HTN, BPs ok Hx/o CVAs    P Cont  urea Cont fluid restriction Await AM labs     Pearson Grippe MD 07/20/2022, 11:11 AM  Recent Labs  Lab 07/17/22 0617 07/18/22 0425 07/18/22 1332 07/19/22 1126  NA 124* 125* 125* 124*  K 4.9 5.4* 4.8 4.7  CL 89* 90*  --  87*  CO2 26 27  --  28  GLUCOSE 97 93  --  144*  BUN 13 16  --  21  CREATININE 1.07 1.14  --  1.40*  CALCIUM 9.0 9.0  --  9.1    No results for input(s): "WBC", "NEUTROABS", "HGB", "HCT", "MCV", "PLT" in the last 168 hours.

## 2022-07-20 NOTE — Progress Notes (Signed)
Mobility Specialist Progress Note   07/20/22 1804  Mobility  Activity Transferred from chair to bed  Level of Assistance +2 (takes two people)  Games developer wheel walker  Distance Ambulated (ft) 2 ft  Activity Response Tolerated well  $Mobility charge 1 Mobility   Pt requesting assistance from two staff members to get back to bed. Back to bed w/ one staff member at contact guard and the other staff member stand by for safety. No fault on transfer, left on bed pan supine in bed w/ call bell in reach. NT present in room.    Holland Falling Mobility Specialist Please contact via SecureChat or  Rehab office at [phone]

## 2022-07-20 NOTE — Progress Notes (Signed)
Mobility Specialist Progress Note   07/20/22 1725  Mobility  Activity Transferred from bed to chair  Level of Assistance Minimal assist, patient does 75% or more  Assistive Device Front wheel walker  Distance Ambulated (ft) 2 ft  Activity Response Tolerated well  $Mobility charge 1 Mobility   Received pt in bed c/o minimal back pain and agreeable. Pt deferring ambulation today d/t fear of falling secondary to tremors. Transferred to chair w/ minA + mod cues on regaining stability when tremors are present. To the chair w/o fault, chair alarm on and call bell in reach.   Holland Falling Mobility Specialist Please contact via SecureChat or  Rehab office at [phone]

## 2022-07-20 NOTE — Progress Notes (Signed)
[facility]  PROGRESS NOTE  Eric Barrett. KDX:[id number] DOB: 1941-03-22 DOA: 07/06/2022 PCP: [name], FNP   Brief HPI:   Eric Mclennan. is a 81 y.o. male with medical history significant of chronic diastolic CHF; glaucoma; dementia; CVA with L hemiparesis; HTN; and HLD presenting with back pain, MRI showing osteo of the spine.  He was last hospitalized from 10/9-18 with acute on chronic hyponatremia and MRSE UTI.  He went to rehab and returned home on 11/27.  He was experiencing some "regression" - not doing as well as he was in rehab.  He is getting home PT.  Due to the severe pain in his back, he had trouble walking, leg and arm tremor.  He has not had chronic back pain, started before last hospitalization.  He wasn't walking well starting in about August but did not really complain.  He started complaining of pain while in rehab, in the lumbar region.  He was having such severe pain that he wasn't really able to get around at home.  He was using a walker and then a wheelchair.   He had an MRI 2 days ago at [facility].   Their PCP got the result yesterday ("infection in the back", recommended IV antibiotics, "osteomyelitis at LS-S1") and told them to take him to [place].     Subjective   No SOB, no CP   Assessment/Plan:    Lumbar osteomyelitis -Patient presented with worsening low back pain, MRI reportedly was diagnostic for osteomyelitis -imaging was performed at [facility], no information available in Care Everywhere -He was recently mated and was diagnosed with UTI from oxacillin resistant Staph epidermidis -Patient's wife brought MRI report on disc, was given to radiology in our hospital -Patient CRP and sed rate are normal -Neurosurgery consulted, no intervention recommended -IR consulted for aspiration -Biopsy done on 12/4 -IV antibiotics started per ID; continue  daptomycin -Blood cultures are negative to date -Abscess cultures grew moderate Staph  epidermidis -ID has changed antibiotics to IV daptomycin 650 mg IV every 24 hours for 6 weeks, end date 08/20/2022  Chronic pain -Secondary to above -MS Contin 15 mg p.o. twice daily  Hyponatremia -chronic, likely SIADH -Serum osmolality 277, urine sodium 101 -TSH was 0.797 in October -  fluid restriction to 1200 ml/day -Tramadol was discontinued as this can cause hyponatremia -Na trending up  Mild hyperkalemia -resolved  Dementia -No behavior disturbance -Continue Depakote, Namenda  Hypertension -Continue amlodipine, losartan  Hyperlipidemia -Continue rosuvastatin   Chronic diastolic CHF -Compensated at this time  BPH -Continue tamsulosin, Myrbetriq, finasteride  Glaucoma -Continue Travatan  Disposition-patient to go to skilled nursing facility after hyponatremia improves  Medications     acetaminophen  1,000 mg Oral TID   amLODipine  10 mg Oral Daily   aspirin  81 mg Oral Daily   Chlorhexidine Gluconate Cloth  6 each Topical Daily   divalproex  500 mg Oral QHS   enoxaparin (LOVENOX) injection  40 mg Subcutaneous Q24H   finasteride  5 mg Oral Daily   latanoprost  1 drop Both Eyes QHS   lidocaine  1 patch Transdermal Q24H   memantine  10 mg Oral BID   mirabegron ER  25 mg Oral Daily   morphine  15 mg Oral Q12H   polyethylene glycol  17 g Oral BID   rosuvastatin  20 mg Oral Daily   senna-docusate  1 tablet Oral BID   sodium chloride flush  10-40 mL Intracatheter Q12H  tamsulosin  0.4 mg Oral Daily   urea  15 g Oral BID     Data Reviewed:   CBG:  No results for input(s): "GLUCAP" in the last 168 hours.   SpO2: 95 % O2 Flow Rate (L/min): 2 L/min    Vitals:   07/19/22 1934 07/19/22 2337 07/20/22 0758 07/20/22 1120  BP: 125/71 125/89 138/76 121/69  Pulse: 82 72 81 73  Resp: '18 17 18 17  '$ Temp: 98.4 F (36.9 C) 98.6 F (37 C) 98.6 F (37 C) 97.8 F (36.6 C)  TempSrc: Oral Oral  Oral  SpO2: 96% 97% 94% 95%  Weight:      Height:           Data Reviewed:  Basic Metabolic Panel: Recent Labs  Lab 07/15/22 1017 07/17/22 0617 07/18/22 0425 07/18/22 1332 07/19/22 1126 07/20/22 1004  NA 127* 124* 125* 125* 124* 127*  K 4.4 4.9 5.4* 4.8 4.7 4.7  CL 92* 89* 90*  --  87* 90*  CO2 '25 26 27  '$ --  28 29  GLUCOSE 122* 97 93  --  144* 153*  BUN '10 13 16  '$ --  21 33*  CREATININE 1.00 1.07 1.14  --  1.40* 1.28*  CALCIUM 9.1 9.0 9.0  --  9.1 9.4    CBC: No results for input(s): "WBC", "NEUTROABS", "HGB", "HCT", "MCV", "PLT" in the last 168 hours.   LFT Recent Labs  Lab 07/14/22 0510  AST 18  ALT 11  ALKPHOS 49  BILITOT 0.3  PROT 6.3*  ALBUMIN 2.6*     Antibiotics: Anti-infectives (From admission, onward)    Start     Dose/Rate Route Frequency Ordered Stop   07/10/22 2000  DAPTOmycin (CUBICIN) 650 mg in sodium chloride 0.9 % IVPB        8 mg/kg  82.6 kg 126 mL/hr over 30 Minutes Intravenous Daily 07/10/22 1654     07/10/22 1800  cefTRIAXone (ROCEPHIN) 2 g in sodium chloride 0.9 % 100 mL IVPB  Status:  Discontinued        2 g 200 mL/hr over 30 Minutes Intravenous Every 24 hours 07/10/22 1654 07/14/22 1256   07/07/22 2300  vancomycin (VANCOREADY) IVPB 1250 mg/250 mL  Status:  Discontinued        1,250 mg 166.7 mL/hr over 90 Minutes Intravenous Every 24 hours 07/06/22 2312 07/07/22 1150   07/07/22 0900  ceFEPIme (MAXIPIME) 2 g in sodium chloride 0.9 % 100 mL IVPB  Status:  Discontinued        2 g 200 mL/hr over 30 Minutes Intravenous Every 12 hours 07/06/22 2330 07/07/22 1150   07/06/22 2315  vancomycin (VANCOCIN) IVPB 1000 mg/200 mL premix  Status:  Discontinued        1,000 mg 200 mL/hr over 60 Minutes Intravenous  Once 07/06/22 2301 07/06/22 2303   07/06/22 2315  vancomycin (VANCOCIN) IVPB 1000 mg/200 mL premix        1,000 mg 200 mL/hr over 60 Minutes Intravenous Every hour 07/06/22 2303 07/07/22 0132   07/06/22 2100  ceFEPIme (MAXIPIME) 2 g in sodium chloride 0.9 % 100 mL IVPB        2 g 200 mL/hr  over 30 Minutes Intravenous  Once 07/06/22 2059 07/06/22 2153        DVT prophylaxis: Lovenox  Code Status: Full code  Family Communication: No family at bedside   CONSULTS infectious disease renal   Objective    Physical Examination:  General: Appearance:     Overweight male in no acute distress     Lungs:      respirations unlabored  Heart:    Normal heart rate. Normal rhythm. No murmurs, rubs, or gallops.   MS:   All extremities are intact.   Neurologic:   Awake, alert      Status is: Inpatient:        Eric Barrett   [facility] If 7PM-7AM, please contact night-coverage at [email or web address], Office  [phone]   07/20/2022, 12:04 PM  LOS: 13 days

## 2022-07-21 LAB — CBC
HCT: 34 % — ABNORMAL LOW (ref 39.0–52.0)
Hemoglobin: 11.4 g/dL — ABNORMAL LOW (ref 13.0–17.0)
MCH: 27.3 pg (ref 26.0–34.0)
MCHC: 33.5 g/dL (ref 30.0–36.0)
MCV: 81.5 fL (ref 80.0–100.0)
Platelets: 290 10*3/uL (ref 150–400)
RBC: 4.17 MIL/uL — ABNORMAL LOW (ref 4.22–5.81)
RDW: 14.2 % (ref 11.5–15.5)
WBC: 5.9 10*3/uL (ref 4.0–10.5)
nRBC: 0 % (ref 0.0–0.2)

## 2022-07-21 LAB — BASIC METABOLIC PANEL
Anion gap: 9 (ref 5–15)
BUN: 43 mg/dL — ABNORMAL HIGH (ref 8–23)
CO2: 28 mmol/L (ref 22–32)
Calcium: 9.2 mg/dL (ref 8.9–10.3)
Chloride: 89 mmol/L — ABNORMAL LOW (ref 98–111)
Creatinine, Ser: 1.35 mg/dL — ABNORMAL HIGH (ref 0.61–1.24)
GFR, Estimated: 53 mL/min — ABNORMAL LOW (ref >60)
Glucose, Bld: 104 mg/dL — ABNORMAL HIGH (ref 70–99)
Potassium: 5.2 mmol/L — ABNORMAL HIGH (ref 3.5–5.1)
Sodium: 126 mmol/L — ABNORMAL LOW (ref 135–145)

## 2022-07-21 MED ORDER — SODIUM ZIRCONIUM CYCLOSILICATE 5 G PO PACK
5.0000 g | PACK | Freq: Once | ORAL | Status: AC
  Administered 2022-07-21: 5 g via ORAL
  Filled 2022-07-21: qty 1

## 2022-07-21 NOTE — Care Management Important Message (Signed)
Important Message  Patient Details  Name: Eric Barrett. MRN: [id number] Date of Birth: Apr 05, 1941   Medicare Important Message Given:  Yes     Hannah Beat 07/21/2022, 11:59 AM

## 2022-07-21 NOTE — Progress Notes (Signed)
Physical Therapy Treatment Patient Details Name: Eric Barrett. MRN: [id number] DOB: Dec 17, 1940 Today's Date: 07/21/2022   History of Present Illness Eric Barrett. is a 81 y.o. male  presenting with back pain, MRI showing osteo of the spine.  He was last hospitalized from 10/9-18 with acute on chronic hyponatremia and MRSE UTI.  He went to rehab and returned home on 11/27.   Pt with medical history significant of chronic diastolic CHF; glaucoma; dementia; CVA with L hemiparesis; HTN; and HLD    PT Comments    Pt tolerates treatment well, demonstrating improved awareness and ability to control, gait pattern to limit tremors and improve safety. With intermittent stoppages of gait pt is able to limit tremor magnitude and improve stability. PT attempts to encourage increased step length to assess knee stability however the pt is able to minimally increase. PT provides squat exercise in an effort to strengthen knee and hip extensors. PT continues to recommend SNF placement.   Recommendations for follow up therapy are one component of a multi-disciplinary discharge planning process, led by the attending physician.  Recommendations may be updated based on patient status, additional functional criteria and insurance authorization.  Follow Up Recommendations  Skilled nursing-short term rehab (<3 hours/day) Can patient physically be transported by private vehicle: No   Assistance Recommended at Discharge Frequent or constant Supervision/Assistance  Patient can return home with the following A little help with walking and/or transfers;A lot of help with bathing/dressing/bathroom;Assistance with cooking/housework;Assistance with feeding;Assist for transportation;Help with stairs or ramp for entrance;Direct supervision/assist for medications management;Direct supervision/assist for financial management   Equipment Recommendations  None recommended by PT    Recommendations for Other Services        Precautions / Restrictions Precautions Precautions: Fall;Back Precaution Booklet Issued: Yes (comment) Precaution Comments: back precautions for comfort Restrictions Weight Bearing Restrictions: No     Mobility  Bed Mobility Overal bed mobility: Needs Assistance Bed Mobility: Supine to Sit     Supine to sit: Min assist, HOB elevated     General bed mobility comments: hand hold to pull trunk into sitting    Transfers Overall transfer level: Needs assistance Equipment used: Rolling walker (2 wheels) Transfers: Sit to/from Stand Sit to Stand: Min guard                Ambulation/Gait Ambulation/Gait assistance: Min assist Gait Distance (Feet): 15 Feet (additional trials of 5' and 8') Assistive device: Rolling walker (2 wheels) Gait Pattern/deviations: Step-to pattern Gait velocity: reduced Gait velocity interpretation: <1.31 ft/sec, indicative of household ambulator   General Gait Details: slowed step-to gait, pt ambulating 2-3' at a time prior to stopping gait due to tremors. Tremors cease with stoppage of gait, begin again after 2-3 steps. If pt mobilizing quickly the tremors increase in magnitude. Attempted to encourage increased step length however pt is able to minimally increase at this time   Stairs             Wheelchair Mobility    Modified Rankin (Stroke Patients Only)       Balance Overall balance assessment: Needs assistance Sitting-balance support: No upper extremity supported, Feet supported Sitting balance-Leahy Scale: Fair     Standing balance support: Bilateral upper extremity supported, Reliant on assistive device for balance Standing balance-Leahy Scale: Poor                              Cognition Arousal/Alertness: Awake/alert Behavior  During Therapy: WFL for tasks assessed/performed Overall Cognitive Status: History of cognitive impairments - at baseline                                  General Comments: appropriate for session        Exercises Other Exercises Other Exercises: mini squats x2 trials for 8 reps, 3 second holds at bottom for final set    General Comments General comments (skin integrity, edema, etc.): VSS on RA      Pertinent Vitals/Pain Pain Assessment Pain Assessment: Faces Faces Pain Scale: Hurts little more Pain Location: back Pain Descriptors / Indicators: Sore Pain Intervention(s): Monitored during session    Home Living                          Prior Function            PT Goals (current goals can now be found in the care plan section) Acute Rehab PT Goals Patient Stated Goal: restore independence in mobility Progress towards PT goals: Progressing toward goals    Frequency    Min 3X/week      PT Plan Current plan remains appropriate    Co-evaluation              AM-PAC PT "6 Clicks" Mobility   Outcome Measure  Help needed turning from your back to your side while in a flat bed without using bedrails?: A Little Help needed moving from lying on your back to sitting on the side of a flat bed without using bedrails?: A Little Help needed moving to and from a bed to a chair (including a wheelchair)?: A Little Help needed standing up from a chair using your arms (e.g., wheelchair or bedside chair)?: A Little Help needed to walk in hospital room?: A Little Help needed climbing 3-5 steps with a railing? : Total 6 Click Score: 16    End of Session Equipment Utilized During Treatment: Gait belt Activity Tolerance: Patient tolerated treatment well Patient left: in chair;with call bell/phone within reach;with chair alarm set Nurse Communication: Mobility status PT Visit Diagnosis: Other abnormalities of gait and mobility (R26.89);Difficulty in walking, not elsewhere classified (R26.2);Pain;History of falling (Z91.81);Muscle weakness (generalized) (M62.81) Pain - part of body:  (back)     Time: 1415-1440 PT  Time Calculation (min) (ACUTE ONLY): 25 min  Charges:  $Gait Training: 8-22 mins $Therapeutic Exercise: 8-22 mins                     Zenaida Niece, PT, DPT Acute Rehabilitation Office [place] 07/21/2022, 2:52 PM

## 2022-07-21 NOTE — Progress Notes (Signed)
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  Subjective:  No interval events AAO x3 At least 1.1L UOP, not exceeding fluid restriction AM L Na is 126 On urea  12/14 0701 - 12/15 0700 In: 1260 [P.O.:1260] Out: 1150 [Urine:1150]  Filed Weights   07/06/22 1958  Weight: 82.6 kg    Scheduled Meds:  acetaminophen  1,000 mg Oral TID   amLODipine  10 mg Oral Daily   aspirin  81 mg Oral Daily   Chlorhexidine Gluconate Cloth  6 each Topical Daily   divalproex  500 mg Oral QHS   enoxaparin (LOVENOX) injection  40 mg Subcutaneous Q24H   finasteride  5 mg Oral Daily   latanoprost  1 drop Both Eyes QHS   lidocaine  1 patch Transdermal Q24H   memantine  10 mg Oral BID   mirabegron ER  25 mg Oral Daily   morphine  15 mg Oral Q12H   polyethylene glycol  17 g Oral BID   rosuvastatin  20 mg Oral Daily   senna-docusate  1 tablet Oral BID   sodium chloride flush  10-40 mL Intracatheter Q12H   tamsulosin  0.4 mg Oral Daily   urea  15 g Oral BID   Continuous Infusions:  sodium chloride 10 mL/hr at 07/16/22 1800   DAPTOmycin (CUBICIN) 650 mg in sodium chloride 0.9 % IVPB 650 mg (07/20/22 2113)   PRN Meds:.bisacodyl, hydrALAZINE, morphine injection, ondansetron **OR** ondansetron (ZOFRAN) IV, mouth rinse, oxyCODONE, sodium chloride flush  Current Labs: reviewed   Latest Reference Range & Units 07/18/22 15:05  Osmolality, Urine 300 - 900 mOsm/kg 504  Sodium, Urine mmol/L 109    Latest Reference Range & Units 07/19/22 11:28  Osmolality 275 - 295 mOsm/kg 275   Physical Exam:  Blood pressure 136/77, pulse 79, temperature (!) 97.5 F (36.4 C), temperature source Oral, resp. rate 16, height '5\' 7"'$  (1.702 m), weight 82.6 kg, SpO2 95 %. GEN: NAD, cordial, pleasant, wd/wn; AAO x3 (self, location,  year/month) ENT: NCAT EYES: EOMI CV: RRR nl s1s2 PULM: CTAB, nl WOB, no wrr ABD: s/nt/nd SKIN: no rashes/lesions EXT:No LEE NOnfocal  A Hypotonic Euvolemic  Hyponatremia most c/w SIADH probably driven by pain + infection + meds + age.  Asymptomatic.  Doesn't require more rapid correction Staph epi lumbar OM on dapto per TRH / RCID Dementia HTN, BPs ok Hx/o CVAs    P Cont  urea Cont fluid restriction 1.2L Stubborn ,not improving quickly, but seems we are still on proper course  Pearson Grippe MD 07/21/2022, 12:11 PM  Recent Labs  Lab 07/19/22 1126 07/20/22 1004 07/21/22 0510  NA 124* 127* 126*  K 4.7 4.7 5.2*  CL 87* 90* 89*  CO2 '28 29 28  '$ GLUCOSE 144* 153* 104*  BUN 21 33* 43*  CREATININE 1.40* 1.28* 1.35*  CALCIUM 9.1 9.4 9.2    Recent Labs  Lab 07/21/22 0510  WBC 5.9  HGB 11.4*  HCT 34.0*  MCV 81.5  PLT 290

## 2022-07-21 NOTE — Progress Notes (Signed)
Mobility Specialist Progress Note   07/21/22 1612  Mobility  Activity Transferred from chair to bed  Level of Assistance Minimal assist, patient does 75% or more  Assistive Device Front wheel walker  Distance Ambulated (ft) 4 ft  Activity Response Tolerated well  $Mobility charge 1 Mobility   Pt calling out to get back to bed d/t fatigue and increased back pain. Pt presenting w/ less tremors when transferring back to bed, no faults during. Call bell left in reach w/ bed alarm on.    Holland Falling Mobility Specialist Please contact via SecureChat or  Rehab office at [phone]

## 2022-07-21 NOTE — Progress Notes (Signed)
[facility]  PROGRESS NOTE  Eric Barrett. YSA:[id number] DOB: 06/10/1941 DOA: 07/06/2022 PCP: [name], FNP   Brief HPI:   Eric Hassell. is a 81 y.o. male with medical history significant of chronic diastolic CHF; glaucoma; dementia; CVA with L hemiparesis; HTN; and HLD presenting with back pain, MRI showing osteo of the spine.  He was last hospitalized from 10/9-18 with acute on chronic hyponatremia and MRSE UTI.  He went to rehab and returned home on 11/27.  He was experiencing some "regression" - not doing as well as he was in rehab.  He is getting home PT.  Due to the severe pain in his back, he had trouble walking, leg and arm tremor.  He has not had chronic back pain, started before last hospitalization.  He wasn't walking well starting in about August but did not really complain.  He started complaining of pain while in rehab, in the lumbar region.  He was having such severe pain that he wasn't really able to get around at home.  He was using a walker and then a wheelchair.   He had an MRI 2 days ago at [facility].   Their PCP got the result yesterday ("infection in the back", recommended IV antibiotics, "osteomyelitis at LS-S1") and told them to take him to [place].     Subjective   No SOB, no CP   Assessment/Plan:    Lumbar osteomyelitis -Patient presented with worsening low back pain, MRI reportedly was diagnostic for osteomyelitis -imaging was performed at [facility], no information available in Care Everywhere -He was recently mated and was diagnosed with UTI from oxacillin resistant Staph epidermidis -Patient's wife brought MRI report on disc, was given to radiology in our hospital -Patient CRP and sed rate are normal -Neurosurgery consulted, no intervention recommended -IR consulted for aspiration -Biopsy done on 12/4 -IV antibiotics started per ID; continue  daptomycin -Blood cultures are negative to date -Abscess cultures grew moderate Staph  epidermidis -ID has changed antibiotics to IV daptomycin 650 mg IV every 24 hours for 6 weeks, end date 08/20/2022  Chronic pain -Secondary to above -MS Contin 15 mg p.o. twice daily  Hyponatremia -chronic, likely SIADH -Serum osmolality 277, urine sodium 101 -TSH was 0.797 in October -  fluid restriction to 1200 ml/day -Tramadol was discontinued as this can cause hyponatremia -per renal  Mild hyperkalemia -lokelma PRN  Dementia -No behavior disturbance -Continue Depakote, Namenda  Hypertension -Continue amlodipine, losartan  Hyperlipidemia -Continue rosuvastatin   Chronic diastolic CHF -Compensated at this time  BPH -Continue tamsulosin, Myrbetriq, finasteride  Glaucoma -Continue Travatan  Disposition-patient to go to skilled nursing facility after hyponatremia improves  Medications     acetaminophen  1,000 mg Oral TID   amLODipine  10 mg Oral Daily   aspirin  81 mg Oral Daily   Chlorhexidine Gluconate Cloth  6 each Topical Daily   divalproex  500 mg Oral QHS   enoxaparin (LOVENOX) injection  40 mg Subcutaneous Q24H   finasteride  5 mg Oral Daily   latanoprost  1 drop Both Eyes QHS   lidocaine  1 patch Transdermal Q24H   memantine  10 mg Oral BID   mirabegron ER  25 mg Oral Daily   morphine  15 mg Oral Q12H   polyethylene glycol  17 g Oral BID   rosuvastatin  20 mg Oral Daily   senna-docusate  1 tablet Oral BID   sodium chloride flush  10-40 mL Intracatheter Q12H  tamsulosin  0.4 mg Oral Daily   urea  15 g Oral BID     Data Reviewed:   CBG:  No results for input(s): "GLUCAP" in the last 168 hours.   SpO2: 95 % O2 Flow Rate (L/min): 2 L/min    Vitals:   07/20/22 2300 07/21/22 0329 07/21/22 0747 07/21/22 1127  BP: 113/64 121/68 121/74 136/77  Pulse: 75 82 71 79  Resp: '14 16 14 16  '$ Temp: 98.4 F (36.9 C) 98.3 F (36.8 C) 97.9 F (36.6 C) (!) 97.5 F (36.4 C)  TempSrc: Oral Oral Oral Oral  SpO2: 95% 100% 97% 95%  Weight:      Height:           Data Reviewed:  Basic Metabolic Panel: Recent Labs  Lab 07/17/22 0617 07/18/22 0425 07/18/22 1332 07/19/22 1126 07/20/22 1004 07/21/22 0510  NA 124* 125* 125* 124* 127* 126*  K 4.9 5.4* 4.8 4.7 4.7 5.2*  CL 89* 90*  --  87* 90* 89*  CO2 26 27  --  '28 29 28  '$ GLUCOSE 97 93  --  144* 153* 104*  BUN 13 16  --  21 33* 43*  CREATININE 1.07 1.14  --  1.40* 1.28* 1.35*  CALCIUM 9.0 9.0  --  9.1 9.4 9.2    CBC: Recent Labs  Lab 07/21/22 0510  WBC 5.9  HGB 11.4*  HCT 34.0*  MCV 81.5  PLT 290     LFT No results for input(s): "AST", "ALT", "ALKPHOS", "BILITOT", "PROT", "ALBUMIN" in the last 168 hours.      DVT prophylaxis: Lovenox  Code Status: Full code  Family Communication: No family at bedside   CONSULTS infectious disease renal   Objective    Physical Examination:    General: Appearance:     Overweight male in no acute distress     Lungs:     respirations unlabored  Heart:    Normal heart rate. Normal rhythm. No murmurs, rubs, or gallops.   MS:   All extremities are intact.   Neurologic:   Awake, alert        Status is: Inpatient:        Geradine Girt   [facility] If 7PM-7AM, please contact night-coverage at [email or web address], Office  [phone]   07/21/2022, 11:53 AM  LOS: 14 days

## 2022-07-22 DIAGNOSIS — M4626 Osteomyelitis of vertebra, lumbar region: Secondary | ICD-10-CM | POA: Diagnosis not present

## 2022-07-22 LAB — BASIC METABOLIC PANEL
Anion gap: 7 (ref 5–15)
BUN: 47 mg/dL — ABNORMAL HIGH (ref 8–23)
CO2: 30 mmol/L (ref 22–32)
Calcium: 9.1 mg/dL (ref 8.9–10.3)
Chloride: 90 mmol/L — ABNORMAL LOW (ref 98–111)
Creatinine, Ser: 1.38 mg/dL — ABNORMAL HIGH (ref 0.61–1.24)
GFR, Estimated: 51 mL/min — ABNORMAL LOW (ref >60)
Glucose, Bld: 100 mg/dL — ABNORMAL HIGH (ref 70–99)
Potassium: 5.2 mmol/L — ABNORMAL HIGH (ref 3.5–5.1)
Sodium: 127 mmol/L — ABNORMAL LOW (ref 135–145)

## 2022-07-22 MED ORDER — SODIUM ZIRCONIUM CYCLOSILICATE 10 G PO PACK
10.0000 g | PACK | Freq: Once | ORAL | Status: AC
  Administered 2022-07-22: 10 g via ORAL
  Filled 2022-07-22: qty 1

## 2022-07-22 NOTE — Progress Notes (Signed)
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  Subjective:  No interval events AAO x3 Feels great SNA 127  12/15 0701 - 12/16 0700 In: 300 [P.O.:300] Out: 2450 [Urine:2450]  Filed Weights   07/06/22 1958  Weight: 82.6 kg    Scheduled Meds:  acetaminophen  1,000 mg Oral TID   amLODipine  10 mg Oral Daily   aspirin  81 mg Oral Daily   Chlorhexidine Gluconate Cloth  6 each Topical Daily   divalproex  500 mg Oral QHS   enoxaparin (LOVENOX) injection  40 mg Subcutaneous Q24H   finasteride  5 mg Oral Daily   latanoprost  1 drop Both Eyes QHS   lidocaine  1 patch Transdermal Q24H   memantine  10 mg Oral BID   mirabegron ER  25 mg Oral Daily   morphine  15 mg Oral Q12H   polyethylene glycol  17 g Oral BID   rosuvastatin  20 mg Oral Daily   senna-docusate  1 tablet Oral BID   sodium chloride flush  10-40 mL Intracatheter Q12H   tamsulosin  0.4 mg Oral Daily   urea  15 g Oral BID   Continuous Infusions:  sodium chloride 10 mL/hr at 07/16/22 1800   DAPTOmycin (CUBICIN) 650 mg in sodium chloride 0.9 % IVPB 650 mg (07/21/22 2016)   PRN Meds:.bisacodyl, hydrALAZINE, morphine injection, ondansetron **OR** ondansetron (ZOFRAN) IV, mouth rinse, oxyCODONE, sodium chloride flush  Current Labs: reviewed   Latest Reference Range & Units 07/18/22 15:05  Osmolality, Urine 300 - 900 mOsm/kg 504  Sodium, Urine mmol/L 109    Latest Reference Range & Units 07/19/22 11:28  Osmolality 275 - 295 mOsm/kg 275   Physical Exam:  Blood pressure 129/71, pulse 74, temperature 97.9 F (36.6 C), temperature source Oral, resp. rate 17, height '5\' 7"'$  (1.702 m), weight 82.6 kg, SpO2 97 %. GEN: NAD, cordial, pleasant, wd/wn; AAO x3 (self, location,  year/month) ENT: NCAT EYES: EOMI CV: RRR nl s1s2 PULM: CTAB, nl WOB, no wrr ABD: s/nt/nd SKIN: no rashes/lesions EXT:No LEE NOnfocal; AAO to self, location, date  A Hypotonic Euvolemic Hyponatremia most c/w SIADH probably  driven by pain + infection + meds + age.  Asymptomatic.  Doesn't require more rapid correction Staph epi lumbar OM on dapto per TRH / RCID Dementia HTN, BPs ok Hx/o CVAs    P Cont  urea Cont fluid restriction 1.2L Again today, stubborn ,not improving quickly, but seems we are still on proper course  Pearson Grippe MD 07/22/2022, 11:25 AM  Recent Labs  Lab 07/20/22 1004 07/21/22 0510 07/22/22 0515  NA 127* 126* 127*  K 4.7 5.2* 5.2*  CL 90* 89* 90*  CO2 '29 28 30  '$ GLUCOSE 153* 104* 100*  BUN 33* 43* 47*  CREATININE 1.28* 1.35* 1.38*  CALCIUM 9.4 9.2 9.1    Recent Labs  Lab 07/21/22 0510  WBC 5.9  HGB 11.4*  HCT 34.0*  MCV 81.5  PLT 290

## 2022-07-22 NOTE — Progress Notes (Signed)
Mobility Specialist Progress Note    07/22/22 1324  Mobility  Activity Ambulated with assistance in room  Level of Assistance Minimal assist, patient does 75% or more  Assistive Device Front wheel walker  Distance Ambulated (ft) 5 ft  Activity Response Tolerated well  Mobility Referral Yes  $Mobility charge 1 Mobility   Pt received in bed requesting to sit up for lunch. No complaints. Left in chair with call bell in reach. Deferred further ambulation for pt to eat. Will f/u as schedule permits.   Hildred Alamin Mobility Specialist  Please Psychologist, sport and exercise or Rehab Office at [phone]

## 2022-07-22 NOTE — Progress Notes (Signed)
[facility]  PROGRESS NOTE  Eric Barrett. EHM:[id number] DOB: [birth year] DOA: 07/06/2022 PCP: [name], FNP   Brief HPI:   Eric Barrett. is a 81 y.o. male with medical history significant of chronic diastolic CHF; glaucoma; dementia; CVA with L hemiparesis; HTN; and HLD presenting with back pain, MRI showing osteo of the spine.  He was last hospitalized from 10/9-18 with acute on chronic hyponatremia and MRSE UTI.  He went to rehab and returned home on 11/27.  He was experiencing some "regression" - not doing as well as he was in rehab.  He is getting home PT.  Due to the severe pain in his back, he had trouble walking, leg and arm tremor.  He has not had chronic back pain, started before last hospitalization.  He wasn't walking well starting in about August but did not really complain.  He started complaining of pain while in rehab, in the lumbar region.  He was having such severe pain that he wasn't really able to get around at home.  He was using a walker and then a wheelchair.   He had an MRI 2 days ago at [facility].   Their PCP got the result yesterday ("infection in the back", recommended IV antibiotics, "osteomyelitis at LS-S1") and told them to take him to [place]. Hospital stay complicated by hyponatremia.  Await SNF placement.    Subjective   No complaints today   Assessment/Plan:    Lumbar osteomyelitis -Patient presented with worsening low back pain, MRI reportedly was diagnostic for osteomyelitis -imaging was performed at [facility], no information available in Care Everywhere -He was recently mated and was diagnosed with UTI from oxacillin resistant Staph epidermidis -Patient's wife brought MRI report on disc, was given to radiology in our hospital -Patient CRP and sed rate are normal -Neurosurgery consulted, no intervention recommended -IR consulted for aspiration -Biopsy done on 12/4 -IV antibiotics started per ID; continue  daptomycin -Blood  cultures are negative to date -Abscess cultures grew moderate Staph epidermidis -ID has changed antibiotics to IV daptomycin 650 mg IV every 24 hours for 6 weeks, end date 08/20/2022  Chronic pain -Secondary to above -MS Contin 15 mg p.o. twice daily  Hyponatremia -chronic, likely SIADH -Serum osmolality 277, urine sodium 101 -TSH was 0.797 in October - fluid restriction to 1200 ml/day -Tramadol was discontinued as this can cause hyponatremia -per renal  Mild hyperkalemia -lokelma PRN  Dementia -No behavior disturbance -Continue Depakote, Namenda  Hypertension -Continue amlodipine, losartan  Hyperlipidemia -Continue rosuvastatin   Chronic diastolic CHF -Compensated at this time  BPH -Continue tamsulosin, Myrbetriq, finasteride  Glaucoma -Continue Travatan  Disposition-patient to go to skilled nursing facility after hyponatremia improves  Medications     acetaminophen  1,000 mg Oral TID   amLODipine  10 mg Oral Daily   aspirin  81 mg Oral Daily   Chlorhexidine Gluconate Cloth  6 each Topical Daily   divalproex  500 mg Oral QHS   enoxaparin (LOVENOX) injection  40 mg Subcutaneous Q24H   finasteride  5 mg Oral Daily   latanoprost  1 drop Both Eyes QHS   lidocaine  1 patch Transdermal Q24H   memantine  10 mg Oral BID   mirabegron ER  25 mg Oral Daily   morphine  15 mg Oral Q12H   polyethylene glycol  17 g Oral BID   rosuvastatin  20 mg Oral Daily   senna-docusate  1 tablet Oral BID   sodium chloride  flush  10-40 mL Intracatheter Q12H   tamsulosin  0.4 mg Oral Daily   urea  15 g Oral BID     Data Reviewed:   CBG:  No results for input(s): "GLUCAP" in the last 168 hours.   SpO2: 97 % O2 Flow Rate (L/min): 2 L/min    Vitals:   07/21/22 1941 07/21/22 2327 07/22/22 0302 07/22/22 0902  BP: 111/67 112/72 99/67 129/71  Pulse: 72 73 98 74  Resp: 16   17  Temp: 97.6 F (36.4 C) 98.2 F (36.8 C) 97.6 F (36.4 C) 97.9 F (36.6 C)  TempSrc: Oral Oral  Oral Oral  SpO2: 97% 98% 98% 97%  Weight:      Height:          Data Reviewed:  Basic Metabolic Panel: Recent Labs  Lab 07/18/22 0425 07/18/22 1332 07/19/22 1126 07/20/22 1004 07/21/22 0510 07/22/22 0515  NA 125* 125* 124* 127* 126* 127*  K 5.4* 4.8 4.7 4.7 5.2* 5.2*  CL 90*  --  87* 90* 89* 90*  CO2 27  --  '28 29 28 30  '$ GLUCOSE 93  --  144* 153* 104* 100*  BUN 16  --  21 33* 43* 47*  CREATININE 1.14  --  1.40* 1.28* 1.35* 1.38*  CALCIUM 9.0  --  9.1 9.4 9.2 9.1    CBC: Recent Labs  Lab 07/21/22 0510  WBC 5.9  HGB 11.4*  HCT 34.0*  MCV 81.5  PLT 290     LFT No results for input(s): "AST", "ALT", "ALKPHOS", "BILITOT", "PROT", "ALBUMIN" in the last 168 hours.      DVT prophylaxis: Lovenox  Code Status: Full code  Family Communication: No family at bedside   CONSULTS infectious disease renal   Objective    Physical Examination:   General: Appearance:     Overweight male in no acute distress     Lungs:     respirations unlabored  Heart:    Normal heart rate.   MS:   All extremities are intact.   Neurologic:   Awake, alert      Status is: Inpatient:        Eric Barrett   [facility] If 7PM-7AM, please contact night-coverage at [email or web address], Office  [phone]   07/22/2022, 12:23 PM  LOS: 15 days

## 2022-07-23 DIAGNOSIS — M4626 Osteomyelitis of vertebra, lumbar region: Secondary | ICD-10-CM | POA: Diagnosis not present

## 2022-07-23 LAB — BASIC METABOLIC PANEL
Anion gap: 7 (ref 5–15)
BUN: 48 mg/dL — ABNORMAL HIGH (ref 8–23)
CO2: 30 mmol/L (ref 22–32)
Calcium: 9.1 mg/dL (ref 8.9–10.3)
Chloride: 93 mmol/L — ABNORMAL LOW (ref 98–111)
Creatinine, Ser: 1.29 mg/dL — ABNORMAL HIGH (ref 0.61–1.24)
GFR, Estimated: 56 mL/min — ABNORMAL LOW (ref >60)
Glucose, Bld: 94 mg/dL (ref 70–99)
Potassium: 5.1 mmol/L (ref 3.5–5.1)
Sodium: 130 mmol/L — ABNORMAL LOW (ref 135–145)

## 2022-07-23 MED ORDER — SODIUM ZIRCONIUM CYCLOSILICATE 10 G PO PACK
10.0000 g | PACK | Freq: Once | ORAL | Status: AC
  Administered 2022-07-23: 10 g via ORAL
  Filled 2022-07-23: qty 1

## 2022-07-23 NOTE — Progress Notes (Signed)
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  Subjective:  No interval events SNa now 130 AAO x3 Feels great Great PO  12/16 0701 - 12/17 0700 In: 1016.3 [P.O.:680; I.V.:85.2; IV Piggyback:251.1] Out: 2900 [Urine:2900]  Filed Weights   07/06/22 1958  Weight: 82.6 kg    Scheduled Meds:  acetaminophen  1,000 mg Oral TID   amLODipine  10 mg Oral Daily   aspirin  81 mg Oral Daily   Chlorhexidine Gluconate Cloth  6 each Topical Daily   divalproex  500 mg Oral QHS   enoxaparin (LOVENOX) injection  40 mg Subcutaneous Q24H   finasteride  5 mg Oral Daily   latanoprost  1 drop Both Eyes QHS   lidocaine  1 patch Transdermal Q24H   memantine  10 mg Oral BID   mirabegron ER  25 mg Oral Daily   morphine  15 mg Oral Q12H   polyethylene glycol  17 g Oral BID   rosuvastatin  20 mg Oral Daily   senna-docusate  1 tablet Oral BID   sodium chloride flush  10-40 mL Intracatheter Q12H   tamsulosin  0.4 mg Oral Daily   urea  15 g Oral BID   Continuous Infusions:  sodium chloride 10 mL/hr at 07/16/22 1800   DAPTOmycin (CUBICIN) 650 mg in sodium chloride 0.9 % IVPB 126 mL/hr at 07/23/22 0421   PRN Meds:.bisacodyl, hydrALAZINE, morphine injection, ondansetron **OR** ondansetron (ZOFRAN) IV, mouth rinse, oxyCODONE, sodium chloride flush  Current Labs: reviewed   Latest Reference Range & Units 07/18/22 15:05  Osmolality, Urine 300 - 900 mOsm/kg 504  Sodium, Urine mmol/L 109    Latest Reference Range & Units 07/19/22 11:28  Osmolality 275 - 295 mOsm/kg 275   Physical Exam:  Blood pressure 127/68, pulse 73, temperature 97.8 F (36.6 C), temperature source Oral, resp. rate 16, height '5\' 7"'$  (1.702 m), weight 82.6 kg, SpO2 96 %. GEN: NAD, cordial, pleasant, wd/wn; AAO x3 (self, location,  year/month) ENT: NCAT EYES: EOMI CV: RRR nl s1s2 PULM: CTAB, nl WOB, no wrr ABD: s/nt/nd SKIN: no rashes/lesions EXT:No LEE NOnfocal; AAO to self, location,  date  A Hypotonic Euvolemic Hyponatremia most c/w SIADH probably driven by pain + infection + meds + age.  Asymptomatic.  Doesn't require more rapid correction; improving with fluid restriction + urea Staph epi lumbar OM on dapto per TRH / [facility] Dementia HTN, BPs ok Hx/o CVAs    P Cont  urea Cont fluid restriction 1.2L No further suggestions, will sign off for now He will need f/u at [facility] and I will arrange.   Probalby can't find urea at DC, cont until SNa > 135, make sure he cont fluid restriction at discharge.  Pearson Grippe MD 07/23/2022, 11:33 AM  Recent Labs  Lab 07/21/22 0510 07/22/22 0515 07/23/22 0510  NA 126* 127* 130*  K 5.2* 5.2* 5.1  CL 89* 90* 93*  CO2 '28 30 30  '$ GLUCOSE 104* 100* 94  BUN 43* 47* 48*  CREATININE 1.35* 1.38* 1.29*  CALCIUM 9.2 9.1 9.1    Recent Labs  Lab 07/21/22 0510  WBC 5.9  HGB 11.4*  HCT 34.0*  MCV 81.5  PLT 290

## 2022-07-23 NOTE — Progress Notes (Signed)
[facility]  PROGRESS NOTE  Eric Barrett. BTD:[id number] DOB: 1940-11-15 DOA: 07/06/2022 PCP: [name], FNP   Brief HPI:   Eric Barrett. is a 81 y.o. male with medical history significant of chronic diastolic CHF; glaucoma; dementia; CVA with L hemiparesis; HTN; and HLD presenting with back pain, MRI showing osteo of the spine.  He was last hospitalized from 10/9-18 with acute on chronic hyponatremia and MRSE UTI.  He went to rehab and returned home on 11/27.  He was experiencing some "regression" - not doing as well as he was in rehab.  He is getting home PT.  Due to the severe pain in his back, he had trouble walking, leg and arm tremor.  He has not had chronic back pain, started before last hospitalization.  He wasn't walking well starting in about August but did not really complain.  He started complaining of pain while in rehab, in the lumbar region.  He was having such severe pain that he wasn't really able to get around at home.  He was using a walker and then a wheelchair.   He had an MRI 2 days ago at [facility].   Their PCP got the result yesterday ("infection in the back", recommended IV antibiotics, "osteomyelitis at LS-S1") and told them to take him to [facility]. Hospital stay complicated by hyponatremia.  Await SNF placement.    Subjective   Feels fine   Assessment/Plan:    Lumbar osteomyelitis -Patient presented with worsening low back pain, MRI reportedly was diagnostic for osteomyelitis -imaging was performed at [facility], no information available in Care Everywhere -He was recently mated and was diagnosed with UTI from oxacillin resistant Staph epidermidis -Patient's wife brought MRI report on disc, was given to radiology in our hospital -Patient CRP and sed rate are normal -Neurosurgery consulted, no intervention recommended -IR consulted for aspiration -Biopsy done on 12/4 -IV antibiotics started per ID; continue  daptomycin -Blood cultures are  negative to date -Abscess cultures grew moderate Staph epidermidis -ID has changed antibiotics to IV daptomycin 650 mg IV every 24 hours for 6 weeks, end date 08/20/2022  Chronic pain -Secondary to above -MS Contin 15 mg p.o. twice daily  Hyponatremia -chronic, likely SIADH -Serum osmolality 277, urine sodium 101 -TSH was 0.797 in October - fluid restriction to 1200 ml/day -Tramadol was discontinued as this can cause hyponatremia -per renal  Mild hyperkalemia -lokelma PRN  Dementia -No behavior disturbance -Continue Depakote, Namenda  Hypertension -Continue amlodipine, losartan  Hyperlipidemia -Continue rosuvastatin   Chronic diastolic CHF -Compensated at this time  BPH -Continue tamsulosin, Myrbetriq, finasteride  Glaucoma -Continue Travatan  Disposition-patient to go to skilled nursing facility after hyponatremia improves  Medications     acetaminophen  1,000 mg Oral TID   amLODipine  10 mg Oral Daily   aspirin  81 mg Oral Daily   Chlorhexidine Gluconate Cloth  6 each Topical Daily   divalproex  500 mg Oral QHS   enoxaparin (LOVENOX) injection  40 mg Subcutaneous Q24H   finasteride  5 mg Oral Daily   latanoprost  1 drop Both Eyes QHS   lidocaine  1 patch Transdermal Q24H   memantine  10 mg Oral BID   mirabegron ER  25 mg Oral Daily   morphine  15 mg Oral Q12H   polyethylene glycol  17 g Oral BID   rosuvastatin  20 mg Oral Daily   senna-docusate  1 tablet Oral BID   sodium chloride flush  10-40 mL Intracatheter Q12H   tamsulosin  0.4 mg Oral Daily   urea  15 g Oral BID     Data Reviewed:   CBG:  No results for input(s): "GLUCAP" in the last 168 hours.   SpO2: 95 % O2 Flow Rate (L/min): 2 L/min    Vitals:   07/22/22 2321 07/23/22 0350 07/23/22 0724 07/23/22 1041  BP: 118/66 128/70 127/68 (!) 147/67  Pulse: 71 68 73 72  Resp: '18 16 16 15  '$ Temp: 98 F (36.7 C) 97.7 F (36.5 C) 97.8 F (36.6 C) 98.2 F (36.8 C)  TempSrc: Oral Oral Oral  Oral  SpO2: 99% 100% 96% 95%  Weight:      Height:          Data Reviewed:  Basic Metabolic Panel: Recent Labs  Lab 07/19/22 1126 07/20/22 1004 07/21/22 0510 07/22/22 0515 07/23/22 0510  NA 124* 127* 126* 127* 130*  K 4.7 4.7 5.2* 5.2* 5.1  CL 87* 90* 89* 90* 93*  CO2 '28 29 28 30 30  '$ GLUCOSE 144* 153* 104* 100* 94  BUN 21 33* 43* 47* 48*  CREATININE 1.40* 1.28* 1.35* 1.38* 1.29*  CALCIUM 9.1 9.4 9.2 9.1 9.1    CBC: Recent Labs  Lab 07/21/22 0510  WBC 5.9  HGB 11.4*  HCT 34.0*  MCV 81.5  PLT 290     LFT No results for input(s): "AST", "ALT", "ALKPHOS", "BILITOT", "PROT", "ALBUMIN" in the last 168 hours.      DVT prophylaxis: Lovenox  Code Status: Full code  Family Communication: No family at bedside   CONSULTS infectious disease renal   Objective    Physical Examination:    General: Appearance:     Overweight male in no acute distress     Lungs:     respirations unlabored  Heart:    Normal heart rate. Normal rhythm. No murmurs, rubs, or gallops.   MS:   All extremities are intact.   Neurologic:   Awake, alert        Status is: Inpatient:  SNF in AM      Geradine Girt   [facility] If 7PM-7AM, please contact night-coverage at [email or web address], Office  [phone]   07/23/2022, 12:52 PM  LOS: 16 days

## 2022-07-23 NOTE — Progress Notes (Signed)
Mobility Specialist Progress Note   07/23/22 1300  Mobility  Activity Repositioned in chair (LE exercise)  Assistive Device Front wheel walker  Range of Motion/Exercises Active;All extremities  Activity Response Tolerated well   Patient received in recliner and agreeable to participate. Declined ambulation for unspecified reasons but agreed to complete seated LE exercises. Tolerated without complaint or incident. Was left in recliner with all needs met, call bell in reach.   [place] Rosalind Guido, BS EXP Mobility Specialist Please contact via SecureChat or Rehab office at [phone]

## 2022-07-24 DIAGNOSIS — M4626 Osteomyelitis of vertebra, lumbar region: Secondary | ICD-10-CM | POA: Diagnosis not present

## 2022-07-24 LAB — BASIC METABOLIC PANEL
Anion gap: 6 (ref 5–15)
BUN: 40 mg/dL — ABNORMAL HIGH (ref 8–23)
CO2: 30 mmol/L (ref 22–32)
Calcium: 9.1 mg/dL (ref 8.9–10.3)
Chloride: 95 mmol/L — ABNORMAL LOW (ref 98–111)
Creatinine, Ser: 1.3 mg/dL — ABNORMAL HIGH (ref 0.61–1.24)
GFR, Estimated: 55 mL/min — ABNORMAL LOW (ref >60)
Glucose, Bld: 96 mg/dL (ref 70–99)
Potassium: 4.9 mmol/L (ref 3.5–5.1)
Sodium: 131 mmol/L — ABNORMAL LOW (ref 135–145)

## 2022-07-24 MED ORDER — MORPHINE SULFATE ER 15 MG PO TBCR: 15.0000 mg | EXTENDED_RELEASE_TABLET | Freq: Two times a day (BID) | ORAL | 0 refills | Status: DC

## 2022-07-24 MED ORDER — DAPTOMYCIN IV (FOR PTA / DISCHARGE USE ONLY): 650.0000 mg | INTRAVENOUS | 0 refills | Status: AC

## 2022-07-24 MED ORDER — UREA 15 G PO PACK: 15.0000 g | PACK | Freq: Two times a day (BID) | ORAL | 0 refills | Status: AC

## 2022-07-24 MED ORDER — OXYCODONE HCL 5 MG PO TABS: 5.0000 mg | ORAL_TABLET | ORAL | 0 refills | Status: DC | PRN

## 2022-07-24 MED ORDER — BISACODYL 5 MG PO TBEC: 5.0000 mg | DELAYED_RELEASE_TABLET | Freq: Every day | ORAL | 0 refills | Status: AC | PRN

## 2022-07-24 MED ORDER — SENNA 8.6 MG PO TABS: 1.0000 | ORAL_TABLET | Freq: Two times a day (BID) | ORAL | Status: AC

## 2022-07-24 MED ORDER — LIDOCAINE 5 % EX PTCH: 1.0000 | MEDICATED_PATCH | CUTANEOUS | 0 refills | Status: DC

## 2022-07-24 NOTE — Progress Notes (Signed)
Mobility Specialist Progress Note   07/24/22 1459  Mobility  Activity Transferred to/from BSC;Dangled on edge of bed;Turned to back - supine  Level of Assistance Minimal assist, patient does 75% or more  Assistive Device Front wheel walker  Distance Ambulated (ft) 4 ft  Activity Response Tolerated well  $Mobility charge 1 Mobility   Pt received on BSC requesting to get back to bed, unsuccessful BM but agreeable to standing and seated exercises. Able to do 2x20secs of marches w/o fault but pt fatiguing quickly and requesting to sit EOB. Once seated, able to complete exercises for LE's and UE's, pt actively having tremors but knows to stop and regain oneself to increase activity tolerance. Once finished, returned back supine w/ all needs met and call bell in reach.       Holland Falling Mobility Specialist Please contact via SecureChat or  Rehab office at [phone]

## 2022-07-24 NOTE — TOC Progression Note (Signed)
Transition of Care (TOC) - Progression Note    Patient Details  Name: Eric Barrett. MRN: [id number] Date of Birth: [birth year]  Transition of Care [facility]) CM/SW [place], [place] Phone Number: 07/24/2022, 2:26 PM  Clinical Narrative:     2:00pm- CSW updated patient's spouse- patient needs reauthorization for SNF and auth could take 2-3 business days. CSW informed per SNF, they do have covid patients in the building but the patient will not be with covid patient. She states understanding.   11:04 am- updated team patient will need re-authorization and udpated therapy notes before SNF can re-submit for authorization.   D/c pending SNF authorization.  Thurmond Butts, MSW, LCSW Clinical Social Worker    Expected Discharge Plan: Skilled Nursing Facility Barriers to Discharge: Continued Medical Work up  Expected Discharge Plan and Services Expected Discharge Plan: [place] In-house Referral: Clinical Social Work Discharge Planning Services: CM Consult Post Acute Care Choice: [place] Living arrangements for the past 2 months: Single Family Home Expected Discharge Date: 07/24/22                                     Social Determinants of Health (SDOH) Interventions    Readmission Risk Interventions    05/17/2022    4:04 PM  Readmission Risk Prevention Plan  Transportation Screening Complete  PCP or Specialist Appt within 5-7 Days Complete  Home Care Screening Complete  Medication Review (RN CM) Complete

## 2022-07-24 NOTE — Progress Notes (Signed)
Physical Therapy Treatment Patient Details Name: Eric Barrett. MRN: [id number] DOB: 08-30-1940 Today's Date: 07/24/2022   History of Present Illness Eric Barrett. is a 81 y.o. male  presenting with back pain, MRI showing osteo of the spine.  He was last hospitalized from 10/9-18 with acute on chronic hyponatremia and MRSE UTI.  He went to rehab and returned home on 11/27.   Pt with medical history significant of chronic diastolic CHF; glaucoma; dementia; CVA with L hemiparesis; HTN; and HLD    PT Comments    Pt making steady gains toward goals, limited by pain and tremors.  Emphasis on safe transition technique, scooting, sit to standing, mini squat trials, transfers to/from [facility] and progression of gait to/from the hall and in room when tremors escalated too much.    Recommendations for follow up therapy are one component of a multi-disciplinary discharge planning process, led by the attending physician.  Recommendations may be updated based on patient status, additional functional criteria and insurance authorization.  Follow Up Recommendations  Skilled nursing-short term rehab (<3 hours/day)     Assistance Recommended at Discharge Frequent or constant Supervision/Assistance  Patient can return home with the following A little help with walking and/or transfers;A lot of help with bathing/dressing/bathroom;Assistance with cooking/housework;Assistance with feeding;Assist for transportation;Help with stairs or ramp for entrance;Direct supervision/assist for medications management;Direct supervision/assist for financial management   Equipment Recommendations  None recommended by PT    Recommendations for Other Services       Precautions / Restrictions Precautions Precautions: Fall;Back Precaution Booklet Issued: Yes (comment) Precaution Comments: back precautions for comfort Restrictions Weight Bearing Restrictions: No     Mobility  Bed Mobility Overal bed mobility:  Needs Assistance Bed Mobility: Supine to Sit Rolling: Min guard Sidelying to sit: Min guard       General bed mobility comments: adequate technique to minimize trauma to the low back    Transfers Overall transfer level: Needs assistance   Transfers: Sit to/from Stand, Bed to chair/wheelchair/BSC Sit to Stand: Min guard Stand pivot transfers: Min assist         General transfer comment: Cues for hand placement.  With transfer, pt tends to stand up fully instead of reaching to the armrest and staying in a squat with more control.    Ambulation/Gait Ambulation/Gait assistance: Min assist Gait Distance (Feet): 22 Feet (x1, 8 x2 with worsening tremors decreasing distance walked) Assistive device: Rolling walker (2 wheels) Gait Pattern/deviations: Step-to pattern, Step-through pattern, Decreased step length - right, Decreased step length - left, Decreased stride length Gait velocity: reduced Gait velocity interpretation: <1.31 ft/sec, indicative of household ambulator   General Gait Details: short step through pattern at times.  Pt continuing until tremors then stops and restarts until the next tremor for a more halted gait patterning.   Stairs             Wheelchair Mobility    Modified Rankin (Stroke Patients Only)       Balance Overall balance assessment: Needs assistance Sitting-balance support: No upper extremity supported, Feet supported Sitting balance-Leahy Scale: Fair     Standing balance support: Bilateral upper extremity supported, Reliant on assistive device for balance Standing balance-Leahy Scale: Poor Standing balance comment: minA with BUE support of RW                            Cognition Arousal/Alertness: Awake/alert Behavior During Therapy: WFL for tasks assessed/performed Overall  Cognitive Status: History of cognitive impairments - at baseline                                 General Comments: appropriate for  session        Exercises Other Exercises Other Exercises: mini squats x2 trials for 10 reps    General Comments        Pertinent Vitals/Pain Pain Assessment Pain Assessment: Faces Faces Pain Scale: Hurts a little bit Pain Location: back Pain Descriptors / Indicators: Sore Pain Intervention(s): Monitored during session    Home Living                          Prior Function            PT Goals (current goals can now be found in the care plan section) Acute Rehab PT Goals Patient Stated Goal: restore independence in mobility PT Goal Formulation: With patient/family Time For Goal Achievement: 08/07/22 Potential to Achieve Goals: Good Progress towards PT goals: Progressing toward goals    Frequency    Min 3X/week      PT Plan Current plan remains appropriate    Co-evaluation              AM-PAC PT "6 Clicks" Mobility   Outcome Measure  Help needed turning from your back to your side while in a flat bed without using bedrails?: A Little Help needed moving from lying on your back to sitting on the side of a flat bed without using bedrails?: A Little Help needed moving to and from a bed to a chair (including a wheelchair)?: A Little Help needed standing up from a chair using your arms (e.g., wheelchair or bedside chair)?: A Little Help needed to walk in hospital room?: A Little Help needed climbing 3-5 steps with a railing? : Total 6 Click Score: 16    End of Session   Activity Tolerance: Patient tolerated treatment well Patient left: in chair;with call bell/phone within reach;with chair alarm set Nurse Communication: Mobility status PT Visit Diagnosis: Other abnormalities of gait and mobility (R26.89);Difficulty in walking, not elsewhere classified (R26.2);Pain;History of falling (Z91.81);Muscle weakness (generalized) (M62.81) Pain - part of body:  (back)     Time: 9798-9211 PT Time Calculation (min) (ACUTE ONLY): 29 min  Charges:  $Gait  Training: 8-22 mins $Therapeutic Activity: 8-22 mins                     07/24/2022  Ginger Carne., PT Acute Rehabilitation Services [phone]  (office)   Eric Barrett Eric Barrett 07/24/2022, 11:29 AM

## 2022-07-24 NOTE — Discharge Summary (Addendum)
Physician Discharge Summary  Eric Barrett. DEY:[id number] DOB: [birth year] DOA: 07/06/2022  PCP: [name], FNP  Admit date: 07/06/2022 Discharge date: 07/24/2022  Admitted From: home Discharge disposition: SNF   Recommendations for Outpatient Follow-Up:   BMP Thursday/Friday-- continue urea until Na >135-- fluid restrict 1268m IV daptomycin 650 mg IV every 24 hours for 6 weeks, end date 08/20/2022-- will d/c with PICC-- will need removed once abx complete   Discharge Diagnosis:   Principal Problem:   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[facility] Active Problems:   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HCC)   Glaucoma   Hypertension   HLD (hyperlipidemia)   Chronic diastolic (congestive) heart failure (HCC)   BPH (benign prostatic hyperplasia)    Discharge Condition: Improved.  Diet recommendation: Low sodium, heart healthy- fluid restrictions  Wound care: None.  Code status: Full.   History of Present Illness:   Eric Barrett is a 81y.o. male with medical history significant of chronic diastolic CHF; glaucoma; dementia; CVA with L hemiparesis; HTN; and HLD presenting with back pain, MRI showing osteo of the spine.  He was last hospitalized from 10/9-18 with acute on chronic hyponatremia and MRSE UTI.  He went to rehab and returned home on 11/27.  He was experiencing some "regression" - not doing as well as he was in rehab.  He is getting home PT.  Due to the severe pain in his back, he had trouble walking, leg and arm tremor.  He has not had chronic back pain, started before last hospitalization.  He wasn't walking well starting in about August but did not really complain.  He started complaining of pain while in rehab, in the lumbar region.  He was having such severe pain that he wasn't really able to get around at home.  He was using a walker and then a wheelchair.   He had an MRI 2 days ago at [facility]   Their PCP got the result yesterday ("infection in the back",  recommended IV antibiotics, "osteomyelitis at LS-S1") and [place]    Hospital Course by Problem:   Lumbar osteomyelitis -Patient presented with worsening low back pain, MRI reportedly was diagnostic for osteomyelitis -imaging was performed at [facility], no information available in Care Everywhere -He was recently mated and was diagnosed with UTI from oxacillin resistant Staph epidermidis -Patient's wife brought MRI report on disc, was given to radiology in our hospital -Patient CRP and sed rate are normal -Neurosurgery consulted, no intervention recommended -IR consulted for aspiration -Biopsy done on 12/4 -IV antibiotics started per ID; continue  daptomycin -Blood cultures are negative to date -Abscess cultures grew moderate Staph epidermidis -ID has changed antibiotics to IV daptomycin 650 mg IV every 24 hours for 6 weeks, end date 08/20/2022   Chronic pain -Secondary to above -MS Contin 15 mg p.o. twice daily   Hyponatremia -chronic, likely SIADH -Serum osmolality 277, urine sodium 101 -TSH was 0.797 in October - fluid restriction to 1200 ml/day -Tramadol was discontinued as this can cause hyponatremia -per renal: urea at DC, cont until SNa > 135, make sure he cont fluid restriction at discharge.    Mild hyperkalemia -lokelma PRN   Dementia -No behavior disturbance -Continue Depakote, Namenda   Hypertension -Continue amlodipine, losartan   Hyperlipidemia -Continue rosuvastatin     Chronic diastolic CHF -Compensated at this time   BPH -Continue tamsulosin, Myrbetriq, finasteride   Glaucoma -Continue Travatan    Medical  Consultants:    renal  Discharge Exam:   Vitals:   07/23/22 2329 07/24/22 0309  BP: 102/68 112/61  Pulse: 77 72  Resp:    Temp: 97.6 F (36.4 C) 98.2 F (36.8 C)  SpO2: 94% 96%   Vitals:   07/23/22 1600 07/23/22 1951 07/23/22 2329 07/24/22 0309  BP: 107/65 117/66 102/68 112/61  Pulse: 74 86 77 72   Resp: 18     Temp: 97.9 F (36.6 C) 98.4 F (36.9 C) 97.6 F (36.4 C) 98.2 F (36.8 C)  TempSrc: Oral Oral Oral Oral  SpO2: 95% 97% 94% 96%  Weight:      Height:        General exam: Appears calm and comfortable.    The results of significant diagnostics from this hospitalization (including imaging, microbiology, ancillary and laboratory) are listed below for reference.     Procedures and Diagnostic Studies:   [place] EKG SITE RITE  Result Date: 07/07/2022 If Site Rite image not attached, placement could not be confirmed due to current cardiac rhythm.  DG Chest Port 1 View  Result Date: 07/06/2022 CLINICAL DATA:  Back pain. EXAM: PORTABLE CHEST 1 VIEW COMPARISON:  None Available. FINDINGS: The heart size and mediastinal contours are within normal limits. There is no evidence of an acute infiltrate, pleural effusion or pneumothorax. Degenerative changes are seen involving both shoulders. Multilevel degenerative changes are noted throughout the thoracic spine. IMPRESSION: No active disease. Electronically Signed   By: Virgina Norfolk M.D.   On: 07/06/2022 20:35     Labs:   Basic Metabolic Panel: Recent Labs  Lab 07/20/22 1004 07/21/22 0510 07/22/22 0515 07/23/22 0510 07/24/22 0613  NA 127* 126* 127* 130* 131*  K 4.7 5.2* 5.2* 5.1 4.9  CL 90* 89* 90* 93* 95*  CO2 _0 GLUCOSE 153* 104* 100* 94 96  BUN 33* 43* 47* 48* 40*  CREATININE 1.28* 1.35* 1.38* 1.29* 1.30*  CALCIUM 9.4 9.2 9.1 9.1 9.1   GFR Estimated Creatinine Clearance: 45.8 mL/min (A) (by C-G formula based on SCr of 1.3 mg/dL (H)). Liver Function Tests: No results for input(s): "AST", "ALT", "ALKPHOS", "BILITOT", "PROT", "ALBUMIN" in the last 168 hours. No results for input(s): "LIPASE", "AMYLASE" in the last 168 hours. No results for input(s): "AMMONIA" in the last 168 hours. Coagulation profile No results for input(s): "INR", "PROTIME" in the last 168 hours.  CBC: Recent Labs  Lab  07/21/22 0510  WBC 5.9  HGB 11.4*  HCT 34.0*  MCV 81.5  PLT 290   Cardiac Enzymes: Recent Labs  Lab 07/18/22 0425  CKTOTAL 71   BNP: Invalid input(s): "POCBNP" CBG: No results for input(s): "GLUCAP" in the last 168 hours. D-Dimer No results for input(s): "DDIMER" in the last 72 hours. Hgb A1c No results for input(s): "HGBA1C" in the last 72 hours. Lipid Profile No results for input(s): "CHOL", "HDL", "LDLCALC", "TRIG", "CHOLHDL", "LDLDIRECT" in the last 72 hours. Thyroid function studies No results for input(s): "TSH", "T4TOTAL", "T3FREE", "THYROIDAB" in the last 72 hours.  Invalid input(s): "FREET3" Anemia work up No results for input(s): "VITAMINB12", "FOLATE", "FERRITIN", "TIBC", "IRON", "RETICCTPCT" in the last 72 hours. Microbiology No results found for this or any previous visit (from the past 240 hour(s)).   Discharge Instructions:   Discharge Instructions     [facility] pharmacist to adjust dose for Vancomycin, Aminoglycosides and other anti-infective therapies as requested by physician.   Complete by: As directed  [facility] to provide Cath Flo 48m   Complete by: As directed    Administer for PICC line occlusion and as ordered by physician for other access device issues.   Anaphylaxis Kit: Provided to treat any anaphylactic reaction to the medication being provided to the patient if First Dose or when requested by physician   Complete by: As directed    Epinephrine 172mml vial / amp: Administer 0.31m51m0.31ml72mubcutaneously once for moderate to severe anaphylaxis, nurse to call physician and pharmacy when reaction occurs and call 911 if needed for immediate care   Diphenhydramine 50mg45mIV vial: Administer 25-50mg 77mM PRN for first dose reaction, rash, itching, mild reaction, nurse to call physician and pharmacy when reaction occurs   Sodium Chloride 0.9% NS 500ml I67mdminister if needed for hypovolemic blood pressure drop or as  ordered by physician after call to physician with anaphylactic reaction   Change dressing on IV access line weekly and PRN   Complete by: As directed    Diet - low sodium heart healthy   Complete by: As directed    Discharge instructions   Complete by: As directed    FLUID RESTRICTION OF 1200ml/da81mntinue urea x 1 week or until Na >135-- BMP check on Thursday   Flush IV access with Sodium Chloride 0.9% and Heparin 10 units/ml or 100 units/ml   Complete by: As directed    Home infusion instructions - [facility]   Complete by: As directed    Instructions: Flush IV access with Sodium Chloride 0.9% and Heparin 10units/ml or 100units/ml   Change dressing on IV access line: Weekly and PRN   Instructions Cath Flo 2mg: Adm87mster for PICC Line occlusion and as ordered by physician for other access device   [facility] pharmacist to adjust dose for: Vancomycin, Aminoglycosides and other anti-infective therapies as requested by physician   Increase activity slowly   Complete by: As directed    Method of administration may be changed at the discretion of home infusion pharmacist based upon assessment of the patient and/or caregiver's ability to self-administer the medication ordered   Complete by: As directed    No wound care   Complete by: As directed       Allergies as of 07/24/2022       Reactions   Beef Allergy Other (See Comments)   Per patient's wife, outpatient GI removed a precancerous polyp and advised patient should avoid chocolate.    Caffeine Other (See Comments)   Per patient's wife, outpatient GI removed a precancerous polyp and advised patient should avoid chocolate.    Chocolate Other (See Comments)   Per patient's wife, outpatient GI removed a precancerous polyp and advised patient should avoid chocolate.         Medication List     STOP taking these medications    losartan 50 MG tablet Commonly known as: COZAAR   Movantik 25 MG Tabs  tablet Generic drug: naloxegol oxalate   traMADol 50 MG tablet Commonly known as: ULTRAM       TAKE these medications    acetaminophen 325 MG tablet Commonly known as: TYLENOL Take 2 tablets (650 mg total) by mouth every 6 (six) hours as needed for mild pain, fever or headache.   amLODipine 10 MG tablet Commonly known as: NORVASC Take 10 mg by mouth daily.   ASPIRIN 81 PO Take 81 mg by mouth daily.   bisacodyl 5 MG EC tablet Commonly known as: DULCOLAX  Take 1 tablet (5 mg total) by mouth daily as needed for moderate constipation.   daptomycin  IVPB Commonly known as: CUBICIN Inject 650 mg into the vein daily. Indication:  discitis/osteomyelitis First Dose: No Last Day of Therapy:  08/20/2022 Labs - Once weekly:  CBC/D, BMP, and CPK Labs - Every other week:  ESR and CRP Method of administration: IV Push Method of administration may be changed at the discretion of home infusion pharmacist based upon assessment of the patient and/or caregiver's ability to self-administer the medication ordered.   divalproex 500 MG 24 hr tablet Commonly known as: Depakote ER Take 1 tablet (500 mg total) by mouth at bedtime.   docusate sodium 100 MG capsule Commonly known as: COLACE Take 100 mg by mouth daily.   finasteride 5 MG tablet Commonly known as: PROSCAR Take 5 mg by mouth daily.   GaviLAX 17 GM/SCOOP powder Generic drug: polyethylene glycol powder Take 17 g by mouth daily as needed for mild constipation.   lidocaine 5 % Commonly known as: LIDODERM Place 1 patch onto the skin daily. Remove & Discard patch within 12 hours or as directed by MD   memantine 10 MG tablet Commonly known as: NAMENDA Take 10 mg by mouth 2 (two) times daily.   morphine 15 MG 12 hr tablet Commonly known as: MS CONTIN Take 1 tablet (15 mg total) by mouth every 12 (twelve) hours.   multivitamin-lutein Caps capsule Take 1 capsule by mouth daily.   Myrbetriq 25 MG Tb24 tablet Generic drug:  mirabegron ER Take 25 mg by mouth daily.   oxyCODONE 5 MG immediate release tablet Commonly known as: Oxy IR/ROXICODONE Take 1 tablet (5 mg total) by mouth every 4 (four) hours as needed for moderate pain.   rosuvastatin 20 MG tablet Commonly known as: CRESTOR Take 20 mg by mouth daily.   senna 8.6 MG Tabs tablet Commonly known as: SENOKOT Take 1 tablet (8.6 mg total) by mouth in the morning and at bedtime.   tamsulosin 0.4 MG Caps capsule Commonly known as: FLOMAX Take 0.4 mg by mouth daily.   Travoprost (BAK Free) 0.004 % Soln ophthalmic solution Commonly known as: TRAVATAN Place 1 drop into both eyes at bedtime.   urea 15 g Pack oral packet Commonly known as: URE-NA Take 15 g by mouth 2 (two) times daily for 7 days.   Vitamin D3 25 MCG (1000 UT) Caps Take 1,000 Units by mouth daily.               Discharge Care Instructions  (From admission, onward)           Start     Ordered   07/24/22 0000  Change dressing on IV access line weekly and PRN  (Home infusion instructions - [facility] )        07/24/22 0831              Time coordinating discharge: 45 min  Signed:  Geradine Girt DO  [facility] 07/24/2022, 10:46 AM

## 2022-07-25 DIAGNOSIS — M4626 Osteomyelitis of vertebra, lumbar region: Secondary | ICD-10-CM | POA: Diagnosis not present

## 2022-07-25 LAB — CK: Total CK: 63 U/L (ref 49–397)

## 2022-07-25 NOTE — Progress Notes (Signed)
[facility]  PROGRESS NOTE  Eric Barrett. TIW:[id number] DOB: 09-02-1940 DOA: 07/06/2022 PCP: [name], FNP   Brief HPI:   Eric Barrett. is a 81 y.o. male with medical history significant of chronic diastolic CHF; glaucoma; dementia; CVA with L hemiparesis; HTN; and HLD presenting with back pain, MRI showing osteo of the spine.  He was last hospitalized from 10/9-18 with acute on chronic hyponatremia and MRSE UTI.  He went to rehab and returned home on 11/27.  He was experiencing some "regression" - not doing as well as he was in rehab.  He is getting home PT.  Due to the severe pain in his back, he had trouble walking, leg and arm tremor.  He has not had chronic back pain, started before last hospitalization.  He wasn't walking well starting in about August but did not really complain.  He started complaining of pain while in rehab, in the lumbar region.  He was having such severe pain that he wasn't really able to get around at home.  He was using a walker and then a wheelchair.   He had an MRI 2 days ago at [facility].   Their PCP got the result yesterday ("infection in the back", recommended IV antibiotics, "osteomyelitis at LS-S1") and told them to take him to [place]. Hospital stay complicated by hyponatremia.  Await SNF placement.    Subjective   Feels fine   Assessment/Plan:    Lumbar osteomyelitis -Patient presented with worsening low back pain, MRI reportedly was diagnostic for osteomyelitis -imaging was performed at [facility], no information available in Care Everywhere -He was recently mated and was diagnosed with UTI from oxacillin resistant Staph epidermidis -Patient's wife brought MRI report on disc, was given to radiology in our hospital -Patient CRP and sed rate are normal -Neurosurgery consulted, no intervention recommended -IR consulted for aspiration -Biopsy done on 12/4 -IV antibiotics started per ID; continue  daptomycin -Blood cultures are  negative to date -Abscess cultures grew moderate Staph epidermidis -ID has changed antibiotics to IV daptomycin 650 mg IV every 24 hours for 6 weeks, end date 08/20/2022  Chronic pain -Secondary to above -MS Contin 15 mg p.o. twice daily  Hyponatremia -chronic, likely SIADH -Serum osmolality 277, urine sodium 101 -TSH was 0.797 in October - fluid restriction to 1200 ml/day -Tramadol was discontinued as this can cause hyponatremia Renal: urea at DC, cont until SNa > 135, make sure he cont fluid restriction at discharge.   Mild hyperkalemia -lokelma PRN  Dementia -No behavior disturbance -Continue Depakote, Namenda  Hypertension -Continue amlodipine, losartan  Hyperlipidemia -Continue rosuvastatin   Chronic diastolic CHF -Compensated at this time  BPH -Continue tamsulosin, Myrbetriq, finasteride  Glaucoma -Continue Travatan    Medications     acetaminophen  1,000 mg Oral TID   amLODipine  10 mg Oral Daily   aspirin  81 mg Oral Daily   Chlorhexidine Gluconate Cloth  6 each Topical Daily   divalproex  500 mg Oral QHS   enoxaparin (LOVENOX) injection  40 mg Subcutaneous Q24H   finasteride  5 mg Oral Daily   latanoprost  1 drop Both Eyes QHS   lidocaine  1 patch Transdermal Q24H   memantine  10 mg Oral BID   mirabegron ER  25 mg Oral Daily   morphine  15 mg Oral Q12H   polyethylene glycol  17 g Oral BID   rosuvastatin  20 mg Oral Daily   senna-docusate  1 tablet  Oral BID   sodium chloride flush  10-40 mL Intracatheter Q12H   tamsulosin  0.4 mg Oral Daily   urea  15 g Oral BID     Data Reviewed:   CBG:  No results for input(s): "GLUCAP" in the last 168 hours.   SpO2: 97 % O2 Flow Rate (L/min): 2 L/min    Vitals:   07/25/22 0310 07/25/22 0745 07/25/22 1143 07/25/22 1503  BP: 100/69 137/65 117/66 (!) 153/83  Pulse: 74 71 71 86  Resp: '18 15 16 17  '$ Temp: 97.6 F (36.4 C) 98.3 F (36.8 C) 97.6 F (36.4 C) 98.4 F (36.9 C)  TempSrc: Oral Oral  Oral Oral  SpO2: 97% 97% 100% 97%  Weight:      Height:          Data Reviewed:  Basic Metabolic Panel: Recent Labs  Lab 07/20/22 1004 07/21/22 0510 07/22/22 0515 07/23/22 0510 07/24/22 0613  NA 127* 126* 127* 130* 131*  K 4.7 5.2* 5.2* 5.1 4.9  CL 90* 89* 90* 93* 95*  CO2 '29 28 30 30 30  '$ GLUCOSE 153* 104* 100* 94 96  BUN 33* 43* 47* 48* 40*  CREATININE 1.28* 1.35* 1.38* 1.29* 1.30*  CALCIUM 9.4 9.2 9.1 9.1 9.1    CBC: Recent Labs  Lab 07/21/22 0510  WBC 5.9  HGB 11.4*  HCT 34.0*  MCV 81.5  PLT 290     LFT No results for input(s): "AST", "ALT", "ALKPHOS", "BILITOT", "PROT", "ALBUMIN" in the last 168 hours.      DVT prophylaxis: Lovenox  Code Status: Full code  Family Communication: No family at bedside   CONSULTS infectious disease renal   Objective    Physical Examination:    General: Appearance:     Overweight male in no acute distress     Status is: Inpatient:  SNF when bed available      Geradine Girt   [facility] If 7PM-7AM, please contact night-coverage at [email or web address], Office  [phone]   07/25/2022, 3:53 PM  LOS: 18 days

## 2022-07-25 NOTE — Progress Notes (Signed)
OT Cancellation Note  Patient Details Name: Eric Barrett. MRN: [id number] DOB: Apr 12, 1941   Cancelled Treatment:    Reason Eval/Treat Not Completed: Other (comment). Pt just started eating lunch, will check back as schedule allows,  [name], OTR/L [facility] [place] [phone] Office [phone]    Almon Register 07/25/2022, 1:16 PM

## 2022-07-25 NOTE — Progress Notes (Signed)
Awaiting insurance authorization for SNF.  Medically ready for d/c.  No overnight events. Eulogio Bear DO

## 2022-07-25 NOTE — TOC Progression Note (Signed)
Transition of Care (TOC) - Progression Note    Patient Details  Name: Eric Barrett. MRN: [id number] Date of Birth: 1940/08/19  Transition of Care [facility]) CM/SW [place], [place] Phone Number: 07/25/2022, 11:35 AM  Clinical Narrative:     Insurance still pending.   Expected Discharge Plan: [place] Barriers to Discharge: Continued Medical Work up  Expected Discharge Plan and Services Expected Discharge Plan: [place] In-house Referral: Clinical Social Work Discharge Planning Services: CM Consult Post Acute Care Choice: [place] Living arrangements for the past 2 months: Single Family Home Expected Discharge Date: 07/25/22                                     Social Determinants of Health (SDOH) Interventions    Readmission Risk Interventions    05/17/2022    4:04 PM  Readmission Risk Prevention Plan  Transportation Screening Complete  PCP or Specialist Appt within 5-7 Days Complete  Home Care Screening Complete  Medication Review (RN CM) Complete

## 2022-07-26 DIAGNOSIS — M4626 Osteomyelitis of vertebra, lumbar region: Secondary | ICD-10-CM | POA: Diagnosis not present

## 2022-07-26 MED ORDER — COVID-19 MRNA 2023-2024 VACCINE (COMIRNATY) 0.3 ML INJECTION
0.3000 mL | Freq: Once | INTRAMUSCULAR | Status: DC
  Filled 2022-07-26: qty 0.3

## 2022-07-26 NOTE — Progress Notes (Signed)
Physical Therapy Treatment Patient Details Name: Eric Barrett. MRN: [id number] DOB: 04/11/1941 Today's Date: 07/26/2022   History of Present Illness Eric Calica. is a 81 y.o. male  presenting with back pain, MRI showing osteo of the spine.  He was last hospitalized from 10/9-18 with acute on chronic hyponatremia and MRSE UTI.  He went to rehab and returned home on 11/27.   Pt with medical history significant of chronic diastolic CHF; glaucoma; dementia; CVA with L hemiparesis; HTN; and HLD    PT Comments    Improved gait and with less tremors able to maintain a steady gait over a longer distance.    Recommendations for follow up therapy are one component of a multi-disciplinary discharge planning process, led by the attending physician.  Recommendations may be updated based on patient status, additional functional criteria and insurance authorization.  Follow Up Recommendations  Skilled nursing-short term rehab (<3 hours/day) Can patient physically be transported by private vehicle: No   Assistance Recommended at Discharge Frequent or constant Supervision/Assistance  Patient can return home with the following A little help with walking and/or transfers;A lot of help with bathing/dressing/bathroom;Assistance with cooking/housework;Assistance with feeding;Assist for transportation;Help with stairs or ramp for entrance;Direct supervision/assist for medications management;Direct supervision/assist for financial management   Equipment Recommendations  None recommended by PT    Recommendations for Other Services       Precautions / Restrictions Precautions Precautions: Fall;Back Precaution Booklet Issued: Yes (comment) Precaution Comments: back precautions for comfort, body tremors Restrictions Weight Bearing Restrictions: No     Mobility  Bed Mobility Overal bed mobility: Needs Assistance Bed Mobility: Rolling, Sidelying to Sit Rolling: Min guard Sidelying to sit:  Min guard       General bed mobility comments: cues for technique, no assist needed from lowed [facility]    Transfers Overall transfer level: Needs assistance Equipment used: Rolling walker (2 wheels) Transfers: Sit to/from Stand Sit to Stand: Min assist           General transfer comment: cues for hand placement, stability assist.    Ambulation/Gait Ambulation/Gait assistance: Min assist Gait Distance (Feet): 65 Feet Assistive device: Rolling walker (2 wheels) Gait Pattern/deviations: Step-through pattern   Gait velocity interpretation: <1.31 ft/sec, indicative of household ambulator   General Gait Details: mildly unsteady with much decreased to episodes where tremors absent.  slow, short steps with weak heel/toe pattern   Stairs             Wheelchair Mobility    Modified Rankin (Stroke Patients Only)       Balance Overall balance assessment: Needs assistance Sitting-balance support: No upper extremity supported, Feet supported Sitting balance-Leahy Scale: Fair     Standing balance support: Bilateral upper extremity supported, Reliant on assistive device for balance, No upper extremity supported Standing balance-Leahy Scale: Fair Standing balance comment: able to stand unsupport statically now that tremors are reduced.                            Cognition Arousal/Alertness: Awake/alert Behavior During Therapy: WFL for tasks assessed/performed Overall Cognitive Status: History of cognitive impairments - at baseline                                 General Comments: pt able to follow commands well, not sure of carryover.        Exercises  General Comments        Pertinent Vitals/Pain Pain Assessment Pain Assessment: Faces Faces Pain Scale: Hurts little more Pain Location: back Pain Descriptors / Indicators: Sore Pain Intervention(s): Patient requesting pain meds-RN notified    Home Living                           Prior Function            PT Goals (current goals can now be found in the care plan section) Acute Rehab PT Goals PT Goal Formulation: With patient/family Time For Goal Achievement: 08/07/22 Potential to Achieve Goals: Good Progress towards PT goals: Progressing toward goals    Frequency    Min 2X/week      PT Plan Current plan remains appropriate    Co-evaluation              AM-PAC PT "6 Clicks" Mobility   Outcome Measure  Help needed turning from your back to your side while in a flat bed without using bedrails?: A Little Help needed moving from lying on your back to sitting on the side of a flat bed without using bedrails?: A Little Help needed moving to and from a bed to a chair (including a wheelchair)?: A Little Help needed standing up from a chair using your arms (e.g., wheelchair or bedside chair)?: A Little Help needed to walk in hospital room?: A Little Help needed climbing 3-5 steps with a railing? : Total 6 Click Score: 16    End of Session   Activity Tolerance: Patient tolerated treatment well Patient left: in chair;with call bell/phone within reach;with chair alarm set Nurse Communication: Mobility status PT Visit Diagnosis: Other abnormalities of gait and mobility (R26.89);Pain Pain - part of body:  (back)     Time: 8882-8003 PT Time Calculation (min) (ACUTE ONLY): 11 min  Charges:  $Gait Training: 8-22 mins                     07/26/2022  Ginger Carne., PT Acute Rehabilitation Services [phone]  (office)   Tessie Fass Will Heinkel 07/26/2022, 1:16 PM

## 2022-07-26 NOTE — Progress Notes (Signed)
Occupational Therapy Treatment Patient Details Name: Eric Barrett. MRN: [id number] DOB: Sep 11, 1940 Today's Date: 07/26/2022   History of present illness Eric Barrett. is a 81 y.o. male  presenting with back pain, MRI showing osteo of the spine.  He was last hospitalized from 10/9-18 with acute on chronic hyponatremia and MRSE UTI.  He went to rehab and returned home on 11/27.   Pt with medical history significant of chronic diastolic CHF; glaucoma; dementia; CVA with L hemiparesis; HTN; and HLD   OT comments  Pt currently supervision for UB bathing and dressing in sitting EOB with min assist for LB selfcare and transfers to and from the bathroom to complete grooming tasks.  Still with body tremor during transitions but gets better the longer he stands.  Recommend continued OT to progress toward supervision level goals.  Will continue to follow.    Recommendations for follow up therapy are one component of a multi-disciplinary discharge planning process, led by the attending physician.  Recommendations may be updated based on patient status, additional functional criteria and insurance authorization.    Follow Up Recommendations  Skilled nursing-short term rehab (<3 hours/day)     Assistance Recommended at Discharge Frequent or constant Supervision/Assistance  Patient can return home with the following  Assistance with cooking/housework;Assist for transportation;Help with stairs or ramp for entrance;A little help with walking and/or transfers;A little help with bathing/dressing/bathroom   Equipment Recommendations  None recommended by OT       Precautions / Restrictions Precautions Precautions: Fall;Back Precaution Comments: back precautions for comfort, body tremors Restrictions Weight Bearing Restrictions: No       Mobility Bed Mobility Overal bed mobility: Needs Assistance Bed Mobility: Supine to Sit     Supine to sit: Supervision           Transfers Overall transfer level: Needs assistance Equipment used: Rolling walker (2 wheels) Transfers: Sit to/from Stand, Bed to chair/wheelchair/BSC Sit to Stand: Min assist     Step pivot transfers: Min assist     General transfer comment: Min instructional cueing for hand placement with sit to stand.     Balance Overall balance assessment: Needs assistance Sitting-balance support: No upper extremity supported, Feet supported Sitting balance-Leahy Scale: Fair     Standing balance support: Bilateral upper extremity supported, Reliant on assistive device for balance Standing balance-Leahy Scale: Fair Standing balance comment: Pt able to stand for some intervals without UE support while brushing teeth at the sink.  Needs UE support for mobility however with use of the RW.                           ADL either performed or assessed with clinical judgement   ADL Overall ADL's : Needs assistance/impaired     Grooming: Min guard;Standing;Oral care;Wash/dry face;Wash/dry hands   Upper Body Bathing: Set up;Sitting Upper Body Bathing Details (indicate cue type and reason): unsupported EOB Lower Body Bathing: Sit to/from stand;Minimal assistance Lower Body Bathing Details (indicate cue type and reason): Pt with slight tremor with initial standing, decreased the longer he stood. Upper Body Dressing : Set up;Sitting   Lower Body Dressing: Minimal assistance;Sit to/from stand Lower Body Dressing Details (indicate cue type and reason): Pt educated on back precautions to bring LEs up and cross to avoid back pain as he started reporting a slight increase with reaching down to wash his leg just below the knee. Toilet Transfer: Minimal assistance;Ambulation;Rolling walker (2 wheels)  Toileting- Clothing Manipulation and Hygiene: Minimal assistance;Sit to/from stand       Functional mobility during ADLs: Minimal assistance;Rolling walker (2 wheels) General ADL Comments: Pt  pleasant and cooperative.  Worked on bathing, dressing, and grooming tasks this session.  Min instructional cueing for hand placement for sit to stand from the EOB and emphasis on smooth controlled transitions as pt at times tries to stand up quickly.      Cognition Arousal/Alertness: Awake/alert Behavior During Therapy: WFL for tasks assessed/performed Overall Cognitive Status: History of cognitive impairments - at baseline                                 General Comments: Pt able to follow directions for selfcare tasks sit to stand with min instructional cueing for hand placement with sit to stand transtions.                   Pertinent Vitals/ Pain       Pain Assessment Pain Assessment: Faces Faces Pain Scale: Hurts little more Pain Location: back Pain Descriptors / Indicators: Sore Pain Intervention(s): Monitored during session, Repositioned, Limited activity within patient's tolerance         Frequency  Min 2X/week        Progress Toward Goals  OT Goals(current goals can now be found in the care plan section)  Progress towards OT goals: Progressing toward goals  Acute Rehab OT Goals OT Goal Formulation: With patient Time For Goal Achievement: 08/09/22 Potential to Achieve Goals: Good  Plan Discharge plan remains appropriate       AM-PAC OT "6 Clicks" Daily Activity     Outcome Measure   Help from another person eating meals?: None Help from another person taking care of personal grooming?: A Little Help from another person toileting, which includes using toliet, bedpan, or urinal?: A Little Help from another person bathing (including washing, rinsing, drying)?: A Little Help from another person to put on and taking off regular upper body clothing?: None Help from another person to put on and taking off regular lower body clothing?: A Little 6 Click Score: 20    End of Session Equipment Utilized During Treatment: Rolling walker (2  wheels);Gait belt  OT Visit Diagnosis: Unsteadiness on feet (R26.81);Muscle weakness (generalized) (M62.81);Other abnormalities of gait and mobility (R26.89);Ataxia, unspecified (R27.0);Pain Pain - part of body:  (lower back)   Activity Tolerance Patient tolerated treatment well   Patient Left in chair;with call bell/phone within reach;with chair alarm set   Nurse Communication Mobility status        Time: 7106-2694 OT Time Calculation (min): 34 min  Charges: OT General Charges $OT Visit: 1 Visit OT Treatments $Self Care/Home Management : 23-37 mins  Danniella Robben OTR/L 07/26/2022, 9:33 AM

## 2022-07-26 NOTE — Discharge Summary (Signed)
Physician Discharge Summary  Eric Barrett. AUQ:[id number] DOB: 1941/02/14 DOA: 07/06/2022  PCP: [name], FNP  Admit date: 07/06/2022 Discharge date: 07/26/2022  Admitted From: home Discharge disposition: SNF   Recommendations for Outpatient Follow-Up:   BMP Thursday/Friday-- continue urea until Na >135-- fluid restrict 1247m IV daptomycin 650 mg IV every 24 hours for 6 weeks, end date 08/20/2022-- will d/c with PICC-- will need removed once abx complete   Discharge Diagnosis:   Principal Problem:   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[facility] Active Problems:   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HCC)   Glaucoma   Hypertension   HLD (hyperlipidemia)   Chronic diastolic (congestive) heart failure (HCC)   BPH (benign prostatic hyperplasia)    Discharge Condition: Improved.  Diet recommendation: Low sodium, heart healthy- fluid restrictions  Wound care: None.  Code status: Full.   History of Present Illness:   Eric Barrett is a 81y.o. male with medical history significant of chronic diastolic CHF; glaucoma; dementia; CVA with L hemiparesis; HTN; and HLD presenting with back pain, MRI showing osteo of the spine.  He was last hospitalized from 10/9-18 with acute on chronic hyponatremia and MRSE UTI.  He went to rehab and returned home on 11/27.  He was experiencing some "regression" - not doing as well as he was in rehab.  He is getting home PT.  Due to the severe pain in his back, he had trouble walking, leg and arm tremor.  He has not had chronic back pain, started before last hospitalization.  He wasn't walking well starting in about August but did not really complain.  He started complaining of pain while in rehab, in the lumbar region.  He was having such severe pain that he wasn't really able to get around at home.  He was using a walker and then a wheelchair.   He had an MRI 2 days ago at [facility]   Their PCP got the result yesterday ("infection in the back",  recommended IV antibiotics, "osteomyelitis at LS-S1") and told them to take him to [facility]    Hospital Course by Problem:   Lumbar osteomyelitis -Patient presented with worsening low back pain, MRI reportedly was diagnostic for osteomyelitis -imaging was performed at [facility], no information available in Care Everywhere -He was recently mated and was diagnosed with UTI from oxacillin resistant Staph epidermidis -Patient's wife brought MRI report on disc, was given to radiology in our hospital -Patient CRP and sed rate are normal -Neurosurgery consulted, no intervention recommended -IR consulted for aspiration -Biopsy done on 12/4 -IV antibiotics started per ID; continue  daptomycin -Blood cultures are negative to date -Abscess cultures grew moderate Staph epidermidis -ID has changed antibiotics to IV daptomycin 650 mg IV every 24 hours for 6 weeks, end date 08/20/2022   Chronic pain -Secondary to above -MS Contin 15 mg p.o. twice daily   Hyponatremia -chronic, likely SIADH -Serum osmolality 277, urine sodium 101 -TSH was 0.797 in October - fluid restriction to 1200 ml/day -Tramadol was discontinued as this can cause hyponatremia -per renal: urea at DC, cont until SNa > 135, make sure he cont fluid restriction at discharge.    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  Medical Consultants:    renal and ID  Discharge Exam:   Vitals:   07/26/22 0300 07/26/22 0739  BP: 119/70 125/66  Pulse: 66 71  Resp: 16 17  Temp: 97.6 F (36.4 C) 97.7 F (36.5 C)  SpO2: 97% 97%   Vitals:   07/25/22 1954 07/25/22 2313 07/26/22 0300 07/26/22 0739  BP: 113/73  115/77 119/70 125/66  Pulse: 72 71 66 71  Resp:  _0 Temp: 97.7 F (36.5 C) 97.9 F (36.6 C) 97.6 F (36.4 C) 97.7 F (36.5 C)  TempSrc: Oral Oral Oral Oral  SpO2: 96% 96% 97% 97%  Weight:      Height:        General exam:  General exam: Appears calm and comfortable  Respiratory system: Clear to auscultation. Respiratory effort normal. Cardiovascular system: S1 & S2 heard, RRR. No JVD, murmurs, rubs, gallops or clicks. No pedal edema. Gastrointestinal system: Abdomen is nondistended, soft and nontender. No organomegaly or masses felt. Normal bowel sounds heard. Central nervous system: Alert and oriented. No focal neurological deficits. Extremities: Symmetric 5 x 5 power. Skin: No rashes, lesions or ulcers.  Psychiatry: Judgement and insight appear normal. Mood & affect appropriate.     The results of significant diagnostics from this hospitalization (including imaging, microbiology, ancillary and laboratory) are listed below for reference.     Procedures and Diagnostic Studies:   [place] EKG SITE RITE  Result Date: 07/07/2022 If Site Rite image not attached, placement could not be confirmed due to current cardiac rhythm.  DG Chest Port 1 View  Result Date: 07/06/2022 CLINICAL DATA:  Back pain. EXAM: PORTABLE CHEST 1 VIEW COMPARISON:  None Available. FINDINGS: The heart size and mediastinal contours are within normal limits. There is no evidence of an acute infiltrate, pleural effusion or pneumothorax. Degenerative changes are seen involving both shoulders. Multilevel degenerative changes are noted throughout the thoracic spine. IMPRESSION: No active disease. Electronically Signed   By: Virgina Norfolk M.D.   On: 07/06/2022 20:35     Labs:   Basic Metabolic Panel: Recent Labs  Lab 07/20/22 1004 07/21/22 0510 07/22/22 0515 07/23/22 0510 07/24/22 0613  NA 127* 126* 127* 130* 131*  K 4.7 5.2* 5.2* 5.1 4.9  CL 90* 89* 90* 93* 95*  CO2 _1 GLUCOSE 153*  104* 100* 94 96  BUN 33* 43* 47* 48* 40*  CREATININE 1.28* 1.35* 1.38* 1.29* 1.30*  CALCIUM 9.4 9.2 9.1 9.1 9.1   GFR Estimated Creatinine Clearance: 45.8 mL/min (A) (by C-G formula based on SCr of 1.3 mg/dL (H)). Liver Function Tests: No results for input(s): "AST", "ALT", "ALKPHOS", "BILITOT", "PROT", "ALBUMIN" in the last 168 hours. No results for input(s): "LIPASE", "AMYLASE" in the last 168 hours. No results for input(s): "AMMONIA" in the last 168 hours. Coagulation profile No results for input(s): "INR", "PROTIME" in the last 168 hours.  CBC: Recent Labs  Lab 07/21/22 0510  WBC 5.9  HGB 11.4*  HCT 34.0*  MCV 81.5  PLT 290   Cardiac Enzymes: Recent Labs  Lab 07/25/22 0317  CKTOTAL 63   BNP: Invalid input(s): "POCBNP" CBG: No results for input(s): "GLUCAP" in the last 168 hours. D-Dimer No results for input(s): "DDIMER" in the last 72 hours. Hgb A1c No results for input(s): "HGBA1C" in the last 72 hours. Lipid Profile No results for input(s): "CHOL", "HDL", "LDLCALC", "TRIG", "CHOLHDL", "LDLDIRECT" in the last 72 hours. Thyroid function studies No results  for input(s): "TSH", "T4TOTAL", "T3FREE", "THYROIDAB" in the last 72 hours.  Invalid input(s): "FREET3" Anemia work up No results for input(s): "VITAMINB12", "FOLATE", "FERRITIN", "TIBC", "IRON", "RETICCTPCT" in the last 72 hours. Microbiology No results found for this or any previous visit (from the past 240 hour(s)).   Discharge Instructions:   Discharge Instructions     [facility] pharmacist to adjust dose for Vancomycin, Aminoglycosides and other anti-infective therapies as requested by physician.   Complete by: As directed    [facility] to provide Cath Flo 77m   Complete by: As directed    Administer for PICC line occlusion and as ordered by physician for other access device issues.   Anaphylaxis Kit: Provided to treat any anaphylactic reaction to the medication being  provided to the patient if First Dose or when requested by physician   Complete by: As directed    Epinephrine 121mml vial / amp: Administer 0.45m7m0.45ml1mubcutaneously once for moderate to severe anaphylaxis, nurse to call physician and pharmacy when reaction occurs and call 911 if needed for immediate care   Diphenhydramine 50mg42mIV vial: Administer 25-50mg 4mM PRN for first dose reaction, rash, itching, mild reaction, nurse to call physician and pharmacy when reaction occurs   Sodium Chloride 0.9% NS 500ml I52mdminister if needed for hypovolemic blood pressure drop or as ordered by physician after call to physician with anaphylactic reaction   Change dressing on IV access line weekly and PRN   Complete by: As directed    Diet - low sodium heart healthy   Complete by: As directed    Discharge instructions   Complete by: As directed    FLUID RESTRICTION OF 1200ml/da77mntinue urea x 1 week or until Na >135-- BMP check on Thursday   Flush IV access with Sodium Chloride 0.9% and Heparin 10 units/ml or 100 units/ml   Complete by: As directed    Home infusion instructions - [facility]   Complete by: As directed    Instructions: Flush IV access with Sodium Chloride 0.9% and Heparin 10units/ml or 100units/ml   Change dressing on IV access line: Weekly and PRN   Instructions Cath Flo 2mg: Adm46mster for PICC Line occlusion and as ordered by physician for other access device   [facility] pharmacist to adjust dose for: Vancomycin, Aminoglycosides and other anti-infective therapies as requested by physician   Increase activity slowly   Complete by: As directed    Method of administration may be changed at the discretion of home infusion pharmacist based upon assessment of the patient and/or caregiver's ability to self-administer the medication ordered   Complete by: As directed    No wound care   Complete by: As directed       Allergies as of 07/26/2022        Reactions   Beef Allergy Other (See Comments)   Per patient's wife, outpatient GI removed a precancerous polyp and advised patient should avoid chocolate.    Caffeine Other (See Comments)   Per patient's wife, outpatient GI removed a precancerous polyp and advised patient should avoid chocolate.    Chocolate Other (See Comments)   Per patient's wife, outpatient GI removed a precancerous polyp and advised patient should avoid chocolate.         Medication List     STOP taking these medications    losartan 50 MG tablet Commonly known as: COZAAR   Movantik 25 MG Tabs tablet Generic drug: naloxegol oxalate  traMADol 50 MG tablet Commonly known as: ULTRAM       TAKE these medications    acetaminophen 325 MG tablet Commonly known as: TYLENOL Take 2 tablets (650 mg total) by mouth every 6 (six) hours as needed for mild pain, fever or headache.   amLODipine 10 MG tablet Commonly known as: NORVASC Take 10 mg by mouth daily.   ASPIRIN 81 PO Take 81 mg by mouth daily.   bisacodyl 5 MG EC tablet Commonly known as: DULCOLAX Take 1 tablet (5 mg total) by mouth daily as needed for moderate constipation.   daptomycin  IVPB Commonly known as: CUBICIN Inject 650 mg into the vein daily. Indication:  discitis/osteomyelitis First Dose: No Last Day of Therapy:  08/20/2022 Labs - Once weekly:  CBC/D, BMP, and CPK Labs - Every other week:  ESR and CRP Method of administration: IV Push Method of administration may be changed at the discretion of home infusion pharmacist based upon assessment of the patient and/or caregiver's ability to self-administer the medication ordered.   divalproex 500 MG 24 hr tablet Commonly known as: Depakote ER Take 1 tablet (500 mg total) by mouth at bedtime.   docusate sodium 100 MG capsule Commonly known as: COLACE Take 100 mg by mouth daily.   finasteride 5 MG tablet Commonly known as: PROSCAR Take 5 mg by mouth daily.   GaviLAX 17 GM/SCOOP  powder Generic drug: polyethylene glycol powder Take 17 g by mouth daily as needed for mild constipation.   lidocaine 5 % Commonly known as: LIDODERM Place 1 patch onto the skin daily. Remove & Discard patch within 12 hours or as directed by MD   memantine 10 MG tablet Commonly known as: NAMENDA Take 10 mg by mouth 2 (two) times daily.   morphine 15 MG 12 hr tablet Commonly known as: MS CONTIN Take 1 tablet (15 mg total) by mouth every 12 (twelve) hours.   multivitamin-lutein Caps capsule Take 1 capsule by mouth daily.   Myrbetriq 25 MG Tb24 tablet Generic drug: mirabegron ER Take 25 mg by mouth daily.   oxyCODONE 5 MG immediate release tablet Commonly known as: Oxy IR/ROXICODONE Take 1 tablet (5 mg total) by mouth every 4 (four) hours as needed for moderate pain.   rosuvastatin 20 MG tablet Commonly known as: CRESTOR Take 20 mg by mouth daily.   senna 8.6 MG Tabs tablet Commonly known as: SENOKOT Take 1 tablet (8.6 mg total) by mouth in the morning and at bedtime.   tamsulosin 0.4 MG Caps capsule Commonly known as: FLOMAX Take 0.4 mg by mouth daily.   Travoprost (BAK Free) 0.004 % Soln ophthalmic solution Commonly known as: TRAVATAN Place 1 drop into both eyes at bedtime.   urea 15 g Pack oral packet Commonly known as: URE-NA Take 15 g by mouth 2 (two) times daily for 7 days.   Vitamin D3 25 MCG (1000 UT) Caps Take 1,000 Units by mouth daily.               Discharge Care Instructions  (From admission, onward)           Start     Ordered   07/24/22 0000  Change dressing on IV access line weekly and PRN  (Home infusion instructions - [facility] )        07/24/22 0831              Time coordinating discharge: 45 min  Signed:  Darliss Cheney MD  [facility] 07/26/2022, 10:30 AM

## 2022-07-26 NOTE — TOC Transition Note (Signed)
Transition of Care [facility]) - CM/SW Discharge Note   Patient Details  Name: Eric Barrett. MRN: [id number] Date of Birth: 02/16/1941  Transition of Care [facility]) CM/SW Contact:  Vinie Sill, LCSW Phone Number: 07/26/2022, 12:28 PM   Clinical Narrative:     Patient will Discharge to: [facility]  Discharge Date: 07/26/2022 Family Notified: spouse Transport By: PTAR @ 2pm   Per MD patient is ready for discharge. RN, patient, and facility notified of discharge. Discharge Summary sent to facility. RN given number for [phone]. Ambulance transport requested for patient.   Clinical Social Worker signing off.  Thurmond Butts, MSW, LCSW Clinical Social Worker     Final next level of care: Skilled Nursing Facility Barriers to Discharge: Barriers Resolved   Patient Goals and CMS Choice Patient states their goals for this hospitalization and ongoing recovery are:: SNF for IV abx needs CMS Medicare.gov Compare Post Acute Care list provided to:: Patient Represenative (must comment) (spouse) Choice offered to / list presented to : Spouse    Discharge Placement              Patient chooses bed at:  [facility]) Patient to be transferred to facility by: [place] Name of family member notified: spouse Patient and family notified of of transfer: 07/26/22  Discharge Plan and Services In-house Referral: Clinical Social Work Discharge Planning Services: CM Consult Post Acute Care Choice: [facility]                               Social Determinants of Health (SDOH) Interventions     Readmission Risk Interventions    05/17/2022    4:04 PM  Readmission Risk Prevention Plan  Transportation Screening Complete  PCP or Specialist Appt within 5-7 Days Complete  Home Care Screening Complete  Medication Review (RN CM) Complete

## 2022-07-26 NOTE — Care Management Important Message (Signed)
Important Message  Patient Details  Name: Eric Barrett. MRN: [id number] Date of Birth: [birth year]   Medicare Important Message Given:  Yes     Hannah Beat 07/26/2022, 3:07 PM

## 2022-08-01 ENCOUNTER — Ambulatory Visit: Payer: Medicare HMO | Admitting: Neurology

## 2022-08-02 ENCOUNTER — Ambulatory Visit: Payer: Medicare Other | Admitting: Neurology

## 2022-08-15 ENCOUNTER — Ambulatory Visit: Payer: Medicare HMO | Admitting: Internal Medicine

## 2022-08-15 NOTE — Progress Notes (Deleted)
[facility] for Infectious Disease  Reason for Consult: Discitis  Referring Provider: Dr Tommy Medal   HPI:    Eric Barrett. is a 82 y.o. male with PMHx as below who presents to the clinic for discitis.   Patient here today as a new patient to myself following a recent admission at [facility].  He was admitted for about 3 weeks from 11/30 - 07/26/22 and ultimately discharged to SNF.  He presented with worsening back pain and an outpatient MRI through [facility] indicating discitis/OM at L5-S1.  IR was consulted for disc space aspiration which was completed 07/10/22.  Cultures yielded methicillin resistant Staph epidermidis.  Notably his baseline inflammatory markers were normal.  There is a history of dementia.  He had a PICC line placed and discharged to SNF on Daptomycin '650mg'$  IV daily through 08/20/22 to complete 6 weeks of therapy.  He is here today for follow up.    Patient's Medications  New Prescriptions   No medications on file  Previous Medications   ACETAMINOPHEN (TYLENOL) 325 MG TABLET    Take 2 tablets (650 mg total) by mouth every 6 (six) hours as needed for mild pain, fever or headache.   AMLODIPINE (NORVASC) 10 MG TABLET    Take 10 mg by mouth daily.   ASPIRIN 81 PO    Take 81 mg by mouth daily.   BISACODYL (DULCOLAX) 5 MG EC TABLET    Take 1 tablet (5 mg total) by mouth daily as needed for moderate constipation.   CHOLECALCIFEROL (VITAMIN D3) 25 MCG (1000 UT) CAPS    Take 1,000 Units by mouth daily.   DAPTOMYCIN (CUBICIN) IVPB    Inject 650 mg into the vein daily. Indication:  discitis/osteomyelitis First Dose: No Last Day of Therapy:  08/20/2022 Labs - Once weekly:  CBC/D, BMP, and CPK Labs - Every other week:  ESR and CRP Method of administration: IV Push Method of administration may be changed at the discretion of home infusion pharmacist based upon assessment of the patient and/or caregiver's ability to self-administer the medication ordered.   DIVALPROEX  (DEPAKOTE ER) 500 MG 24 HR TABLET    Take 1 tablet (500 mg total) by mouth at bedtime.   DOCUSATE SODIUM (COLACE) 100 MG CAPSULE    Take 100 mg by mouth daily.   FINASTERIDE (PROSCAR) 5 MG TABLET    Take 5 mg by mouth daily.   GAVILAX 17 GM/SCOOP POWDER    Take 17 g by mouth daily as needed for mild constipation.   LIDOCAINE (LIDODERM) 5 %    Place 1 patch onto the skin daily. Remove & Discard patch within 12 hours or as directed by MD   MEMANTINE (NAMENDA) 10 MG TABLET    Take 10 mg by mouth 2 (two) times daily.   MORPHINE (MS CONTIN) 15 MG 12 HR TABLET    Take 1 tablet (15 mg total) by mouth every 12 (twelve) hours.   MULTIVITAMIN-LUTEIN (OCUVITE-LUTEIN) CAPS CAPSULE    Take 1 capsule by mouth daily.   MYRBETRIQ 25 MG TB24 TABLET    Take 25 mg by mouth daily.   OXYCODONE (OXY IR/ROXICODONE) 5 MG IMMEDIATE RELEASE TABLET    Take 1 tablet (5 mg total) by mouth every 4 (four) hours as needed for moderate pain.   ROSUVASTATIN (CRESTOR) 20 MG TABLET    Take 20 mg by mouth daily.   SENNA (SENOKOT) 8.6 MG TABS TABLET    Take  1 tablet (8.6 mg total) by mouth in the morning and at bedtime.   TAMSULOSIN (FLOMAX) 0.4 MG CAPS CAPSULE    Take 0.4 mg by mouth daily.   TRAVOPROST, BAK FREE, (TRAVATAN) 0.004 % SOLN OPHTHALMIC SOLUTION    Place 1 drop into both eyes at bedtime.  Modified Medications   No medications on file  Discontinued Medications   No medications on file      Past Medical History:  Diagnosis Date   BPH (benign prostatic hyperplasia)    Chronic diastolic (congestive) heart failure (HCC)    Dementia (HCC)    Glaucoma    Hemiparesis of left nondominant side (HCC)    History of shingles    HLD (hyperlipidemia)    Hypertension    Stroke (HCC)    Tremor     Social History   Tobacco Use   Smoking status: Former    Types: Cigars    Quit date: 1985    Years since quitting: 39.0   Smokeless tobacco: Never  Substance Use Topics   Alcohol use: Not Currently   Drug use: Never     Family History  Problem Relation Age of Onset   Heart attack Mother    Heart attack Father     Allergies  Allergen Reactions   Beef Allergy Other (See Comments)    Per patient's wife, outpatient GI removed a precancerous polyp and advised patient should avoid chocolate.    Caffeine Other (See Comments)    Per patient's wife, outpatient GI removed a precancerous polyp and advised patient should avoid chocolate.    Chocolate Other (See Comments)    Per patient's wife, outpatient GI removed a precancerous polyp and advised patient should avoid chocolate.     ROS    OBJECTIVE:    There were no vitals filed for this visit.   There is no height or weight on file to calculate BMI.  Physical Exam   Labs and Microbiology:     Latest Ref Rng & Units 07/21/2022    5:10 AM 07/13/2022    4:32 AM 07/12/2022    6:12 AM  CBC  WBC 4.0 - 10.5 K/uL 5.9  5.7  7.2   Hemoglobin 13.0 - 17.0 g/dL 11.4  11.0  11.6   Hematocrit 39.0 - 52.0 % 34.0  31.4  32.7   Platelets 150 - 400 K/uL 290  275  289       Latest Ref Rng & Units 07/24/2022    6:13 AM 07/23/2022    5:10 AM 07/22/2022    5:15 AM  CMP  Glucose 70 - 99 mg/dL 96  94  100   BUN 8 - 23 mg/dL 40  48  47   Creatinine 0.61 - 1.24 mg/dL 1.30  1.29  1.38   Sodium 135 - 145 mmol/L 131  130  127   Potassium 3.5 - 5.1 mmol/L 4.9  5.1  5.2   Chloride 98 - 111 mmol/L 95  93  90   CO2 22 - 32 mmol/L '30  30  30   '$ Calcium 8.9 - 10.3 mg/dL 9.1  9.1  9.1      No results found for this or any previous visit (from the past 240 hour(s)).  Imaging:    ASSESSMENT & PLAN:    No problem-specific Assessment & Plan notes found for this encounter.   No orders of the defined types were placed in this encounter.     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  Clinically stable on daptomycin '650mg'$  IV daily via PICC line.  I have reviewed the records from SNF.  He will complete 6 weeks  of therapy on 08/20/22 and then should have his PICC line removed.  There was no evidence on initial MRI of epidural abscess so will forego repeat imaging at this time unless there is a clinical change in the future.  Plan for ID follow up as needed following antibiotic therapy.   Raynelle [facility] for Infectious Disease [facility] 08/15/2022, 7:23 AM

## 2022-08-21 ENCOUNTER — Ambulatory Visit: Payer: Medicare HMO | Admitting: Internal Medicine

## 2022-08-28 ENCOUNTER — Ambulatory Visit (INDEPENDENT_AMBULATORY_CARE_PROVIDER_SITE_OTHER): Payer: Medicare HMO | Admitting: Internal Medicine

## 2022-08-28 ENCOUNTER — Encounter: Payer: Self-pay | Admitting: Internal Medicine

## 2022-08-28 ENCOUNTER — Telehealth: Payer: Self-pay

## 2022-08-28 ENCOUNTER — Other Ambulatory Visit: Payer: Self-pay

## 2022-08-28 VITALS — BP 131/74 | HR 81 | Temp 97.3°F | Ht 67.0 in | Wt 182.0 lb

## 2022-08-28 DIAGNOSIS — M4626 Osteomyelitis of vertebra, lumbar region: Secondary | ICD-10-CM | POA: Diagnosis not present

## 2022-08-28 NOTE — Telephone Encounter (Signed)
Per Dr. Juleen China discontinue IV antibiotics and pull picc. [facility] [phone]) and relayed orders to nurse Varney Biles who read back orders and verbalized understanding.

## 2022-08-28 NOTE — Assessment & Plan Note (Signed)
  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  Clinically stable on daptomycin '650mg'$  IV daily via PICC line.  I have reviewed the records from SNF.  He was to have completed 6 weeks of therapy on 08/20/22 but this has been  and continued to date per patient and the MAR.  Will recommend stopping daptomycin and having his PICC line removed.  There was no evidence on initial MRI of epidural abscess so will forego repeat imaging at this time unless there is a clinical change in the future.  Plan for ID follow up as needed now completed antibiotic therapy.

## 2022-08-28 NOTE — Progress Notes (Signed)
[name]  Reason for Consult: Discitis  Referring Provider: Dr Tommy Medal   HPI:    Eric Barrett. is a 82 y.o. male with PMHx as below who presents to the clinic for discitis.   Patient here today as a new patient to myself following a recent admission at [facility].  He was admitted for about 3 weeks from 11/30 - 07/26/22 and ultimately discharged to SNF.  He presented with worsening back pain and an outpatient MRI through [facility] indicating discitis/OM at L5-S1.  IR was consulted for disc space aspiration which was completed 07/10/22.  Cultures yielded methicillin resistant Staph epidermidis.  Notably his baseline inflammatory markers were normal.  There is a history of dementia.  He had a PICC line placed and discharged to SNF on Daptomycin '650mg'$  IV daily through 08/20/22 to complete 6 weeks of therapy.  He is here today for follow up with his wife.  PICC line remains in place and he is still getting IV antibiotics.    Patient's Medications  New Prescriptions   No medications on file  Previous Medications   ACETAMINOPHEN (TYLENOL) 325 MG TABLET    Take 2 tablets (650 mg total) by mouth every 6 (six) hours as needed for mild pain, fever or headache.   AMLODIPINE (NORVASC) 10 MG TABLET    Take 10 mg by mouth daily.   ASPIRIN 81 PO    Take 81 mg by mouth daily.   BISACODYL (DULCOLAX) 5 MG EC TABLET    Take 1 tablet (5 mg total) by mouth daily as needed for moderate constipation.   CHOLECALCIFEROL (VITAMIN D3) 25 MCG (1000 UT) CAPS    Take 1,000 Units by mouth daily.   DAPTOMYCIN (CUBICIN) IVPB    Inject 650 mg into the vein daily. Indication:  discitis/osteomyelitis First Dose: No Last Day of Therapy:  08/20/2022 Labs - Once weekly:  CBC/D, BMP, and CPK Labs - Every other week:  ESR and CRP Method of administration: IV Push Method of administration may be changed at the discretion of home infusion pharmacist based upon assessment of the patient  and/or caregiver's ability to self-administer the medication ordered.   DIVALPROEX (DEPAKOTE ER) 500 MG 24 HR TABLET    Take 1 tablet (500 mg total) by mouth at bedtime.   DOCUSATE SODIUM (COLACE) 100 MG CAPSULE    Take 100 mg by mouth daily.   FINASTERIDE (PROSCAR) 5 MG TABLET    Take 5 mg by mouth daily.   GAVILAX 17 GM/SCOOP POWDER    Take 17 g by mouth daily as needed for mild constipation.   LIDOCAINE (LIDODERM) 5 %    Place 1 patch onto the skin daily. Remove & Discard patch within 12 hours or as directed by MD   MEMANTINE (NAMENDA) 10 MG TABLET    Take 10 mg by mouth 2 (two) times daily.   MORPHINE (MS CONTIN) 15 MG 12 HR TABLET    Take 1 tablet (15 mg total) by mouth every 12 (twelve) hours.   MULTIVITAMIN-LUTEIN (OCUVITE-LUTEIN) CAPS CAPSULE    Take 1 capsule by mouth daily.   MYRBETRIQ 25 MG TB24 TABLET    Take 25 mg by mouth daily.   OXYCODONE (OXY IR/ROXICODONE) 5 MG IMMEDIATE RELEASE TABLET    Take 1 tablet (5 mg total) by mouth every 4 (four) hours as needed for moderate pain.   ROSUVASTATIN (CRESTOR) 20 MG TABLET    Take 20  mg by mouth daily.   SENNA (SENOKOT) 8.6 MG TABS TABLET    Take 1 tablet (8.6 mg total) by mouth in the morning and at bedtime.   TAMSULOSIN (FLOMAX) 0.4 MG CAPS CAPSULE    Take 0.4 mg by mouth daily.   TRAVOPROST, BAK FREE, (TRAVATAN) 0.004 % SOLN OPHTHALMIC SOLUTION    Place 1 drop into both eyes at bedtime.  Modified Medications   No medications on file  Discontinued Medications   No medications on file      Past Medical History:  Diagnosis Date   BPH (benign prostatic hyperplasia)    Chronic diastolic (congestive) heart failure (HCC)    Dementia (HCC)    Glaucoma    Hemiparesis of left nondominant side (HCC)    History of shingles    HLD (hyperlipidemia)    Hypertension    Stroke (HCC)    Tremor     Social History   Tobacco Use   Smoking status: Former    Types: Cigars    Quit date: 1985    Years since quitting: 39.0   Smokeless  tobacco: Never  Substance Use Topics   Alcohol use: Not Currently   Drug use: Never    Family History  Problem Relation Age of Onset   Heart attack Mother    Heart attack Father     Allergies  Allergen Reactions   Beef Allergy Other (See Comments)    Per patient's wife, outpatient GI removed a precancerous polyp and advised patient should avoid chocolate.    Caffeine Other (See Comments)    Per patient's wife, outpatient GI removed a precancerous polyp and advised patient should avoid chocolate.    Chocolate Other (See Comments)    Per patient's wife, outpatient GI removed a precancerous polyp and advised patient should avoid chocolate.     Review of Systems  Unable to perform ROS: Dementia      OBJECTIVE:    Vitals:   08/28/22 0854  BP: 131/74  Pulse: 81  Temp: (!) 97.3 F (36.3 C)  TempSrc: Temporal  SpO2: 97%  Weight: 182 lb (82.6 kg)  Height: '5\' 7"'$  (1.702 m)     Body mass index is 28.51 kg/m.  Physical Exam Constitutional:      General: He is not in acute distress. HENT:     Head: Normocephalic and atraumatic.  Eyes:     Extraocular Movements: Extraocular movements intact.     Conjunctiva/sclera: Conjunctivae normal.  Musculoskeletal:     Right lower leg: No edema.     Left lower leg: No edema.  Skin:    General: Skin is warm and dry.     Findings: No rash.  Neurological:     General: No focal deficit present.     Mental Status: He is alert. Mental status is at baseline.  Psychiatric:        Mood and Affect: Mood normal.        Behavior: Behavior normal.      Labs and Microbiology:     Latest Ref Rng & Units 07/21/2022    5:10 AM 07/13/2022    4:32 AM 07/12/2022    6:12 AM  CBC  WBC 4.0 - 10.5 K/uL 5.9  5.7  7.2   Hemoglobin 13.0 - 17.0 g/dL 11.4  11.0  11.6   Hematocrit 39.0 - 52.0 % 34.0  31.4  32.7   Platelets 150 - 400 K/uL 290  275  289  Latest Ref Rng & Units 07/24/2022    6:13 AM 07/23/2022    5:10 AM 07/22/2022     5:15 AM  CMP  Glucose 70 - 99 mg/dL 96  94  100   BUN 8 - 23 mg/dL 40  48  47   Creatinine 0.61 - 1.24 mg/dL 1.30  1.29  1.38   Sodium 135 - 145 mmol/L 131  130  127   Potassium 3.5 - 5.1 mmol/L 4.9  5.1  5.2   Chloride 98 - 111 mmol/L 95  93  90   CO2 22 - 32 mmol/L '30  30  30   '$ Calcium 8.9 - 10.3 mg/dL 9.1  9.1  9.1      No results found for this or any previous visit (from the past 240 hour(s)).  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  Clinically stable on daptomycin '650mg'$  IV daily via PICC line.  I have reviewed the records from SNF.  He was to have completed 6 weeks of therapy on 08/20/22 but this has been  and continued to date per patient and the MAR.  Will recommend stopping daptomycin and having his PICC line removed.  There was no evidence on initial MRI of epidural abscess so will forego repeat imaging at this time unless there is a clinical change in the future.  Plan for ID follow up as needed now completed antibiotic therapy.     Raynelle [facility] for Infectious [facility] 08/28/2022, 9:21 AM  I have personally spent 30 minutes involved in face-to-face and non-face-to-face activities for this patient on the day of the visit. Professional time spent includes the following activities: Preparing to see the patient (review of tests), Obtaining and/or reviewing separately obtained history (admission/discharge record), Performing a medically appropriate examination and/or evaluation , Ordering medications/tests/procedures, referring and communicating with other health care professionals, Documenting clinical information in the EMR, Independently interpreting results (not separately reported), Communicating results to the patient/family/caregiver, Counseling and educating the patient/family/caregiver and Care  coordination (not separately reported).

## 2022-08-28 NOTE — Patient Instructions (Signed)
  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  Clinically stable on daptomycin '650mg'$  IV daily via PICC line.  I have reviewed the records from SNF.  He was to have completed 6 weeks of therapy on 08/20/22 but this has been  and continued to date per patient and the MAR.  Will recommend stopping daptomycin and having his PICC line removed.  There was no evidence on initial MRI of epidural abscess so will forego repeat imaging at this time unless there is a clinical change in the future.  Plan for ID follow up as needed now completed antibiotic therapy.

## 2022-09-04 DIAGNOSIS — N1831 Chronic kidney disease, stage 3a: Secondary | ICD-10-CM | POA: Insufficient documentation

## 2022-09-04 DIAGNOSIS — R251 Tremor, unspecified: Secondary | ICD-10-CM | POA: Insufficient documentation

## 2022-09-04 DIAGNOSIS — F0150 Vascular dementia without behavioral disturbance: Secondary | ICD-10-CM | POA: Insufficient documentation

## 2022-09-04 DIAGNOSIS — I69351 Hemiplegia and hemiparesis following cerebral infarction affecting right dominant side: Secondary | ICD-10-CM | POA: Insufficient documentation

## 2022-09-07 ENCOUNTER — Ambulatory Visit: Payer: No Typology Code available for payment source | Admitting: Neurology

## 2022-09-07 ENCOUNTER — Encounter: Payer: Self-pay | Admitting: Neurology

## 2022-09-07 VITALS — BP 132/71 | HR 87

## 2022-09-07 DIAGNOSIS — Z8673 Personal history of transient ischemic attack (TIA), and cerebral infarction without residual deficits: Secondary | ICD-10-CM | POA: Diagnosis not present

## 2022-09-07 DIAGNOSIS — F0390 Unspecified dementia without behavioral disturbance: Secondary | ICD-10-CM

## 2022-09-07 DIAGNOSIS — R404 Transient alteration of awareness: Secondary | ICD-10-CM
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
## 2022-09-07 NOTE — Patient Instructions (Addendum)
Continue current medications Namenda, Depakote  Call for any seizure like spells

## 2022-09-20 ENCOUNTER — Ambulatory Visit: Payer: No Typology Code available for payment source | Admitting: Neurology

## 2022-10-05 ENCOUNTER — Ambulatory Visit: Payer: Medicare HMO | Admitting: Podiatry

## 2022-10-05 DIAGNOSIS — M79674 Pain in right toe(s): Secondary | ICD-10-CM | POA: Diagnosis not present

## 2022-10-05 DIAGNOSIS — M79675 Pain in left toe(s): Secondary | ICD-10-CM

## 2022-10-05 DIAGNOSIS — B351 Tinea unguium: Secondary | ICD-10-CM

## 2022-10-05 NOTE — Progress Notes (Signed)
  Subjective:  Patient ID: Eric Crane., male    DOB: July 21, 1941,  MRN: [id number]  Chief Complaint  Patient presents with   Nail Problem    Nail trim    82 y.o. male returns for the above complaint.  Patient presents with thickened elongated dystrophic mycotic toenails x 10 mild pain on palpation hurts with ambulation worse with pressure shoe he would like me to debride down he is not able to do it himself.  Objective:  There were no vitals filed for this visit. Podiatric Exam: Vascular: dorsalis pedis and posterior tibial pulses are palpable bilateral. Capillary return is immediate. Temperature gradient is WNL. Skin turgor WNL  Sensorium: Normal Semmes Weinstein monofilament test. Normal tactile sensation bilaterally. Nail Exam: Pt has thick disfigured discolored nails with subungual debris noted bilateral entire nail hallux through fifth toenails.  Pain on palpation to the nails. Ulcer Exam: There is no evidence of ulcer or pre-ulcerative changes or infection. Orthopedic Exam: Muscle tone and strength are WNL. No limitations in general ROM. No crepitus or effusions noted.  Skin: No Porokeratosis. No infection or ulcers    Assessment & Plan:   1. Pain due to onychomycosis of toenails of both feet     Patient was evaluated and treated and all questions answered.  Onychomycosis with pain  -Nails palliatively debrided as below. -Educated on self-care  Procedure: Nail Debridement Rationale: pain  Type of Debridement: manual, sharp debridement. Instrumentation: Nail nipper, rotary burr. Number of Nails: 10  Procedures and Treatment: Consent by patient was obtained for treatment procedures. The patient understood the discussion of treatment and procedures well. All questions were answered thoroughly reviewed. Debridement of mycotic and hypertrophic toenails, 1 through 5 bilateral and clearing of subungual debris. No ulceration, no infection noted.  Return Visit-Office  Procedure: Patient instructed to return to the office for a follow up visit 3 months for continued evaluation and treatment.  Boneta Lucks, DPM    Return in about 3 months (around 01/03/2023) for RFC .

## 2022-10-13 ENCOUNTER — Encounter: Payer: Self-pay | Admitting: Podiatry

## 2022-10-16 ENCOUNTER — Ambulatory Visit
Admission: RE | Admit: 2022-10-16 | Discharge: 2022-10-16 | Disposition: A | Discharge status: Home / Self Care | Payer: Medicare HMO | Source: Ambulatory Visit | Attending: Family | Admitting: Family

## 2022-10-16 ENCOUNTER — Other Ambulatory Visit: Payer: Self-pay | Admitting: Family

## 2022-10-16 DIAGNOSIS — M25512 Pain in left shoulder: Secondary | ICD-10-CM

## 2022-12-05 ENCOUNTER — Emergency Department (HOSPITAL_COMMUNITY): Payer: Medicare HMO

## 2022-12-05 ENCOUNTER — Other Ambulatory Visit: Payer: Self-pay

## 2022-12-05 ENCOUNTER — Emergency Department (HOSPITAL_COMMUNITY)
Admission: EM | Admit: 2022-12-05 | Discharge: 2022-12-05 | Disposition: A | Discharge status: Home / Self Care | Payer: Medicare HMO | Attending: Emergency Medicine | Admitting: Emergency Medicine

## 2022-12-05 ENCOUNTER — Encounter (HOSPITAL_COMMUNITY): Payer: Self-pay

## 2022-12-05 ENCOUNTER — Telehealth: Payer: Self-pay | Admitting: Neurology

## 2022-12-05 DIAGNOSIS — Z79899 Other long term (current) drug therapy: Secondary | ICD-10-CM | POA: Diagnosis not present

## 2022-12-05 DIAGNOSIS — I5032 Chronic diastolic (congestive) heart failure: Secondary | ICD-10-CM | POA: Insufficient documentation

## 2022-12-05 DIAGNOSIS — R519 Headache, unspecified: Secondary | ICD-10-CM

## 2022-12-05 DIAGNOSIS — R52 Pain, unspecified: Secondary | ICD-10-CM

## 2022-12-05 DIAGNOSIS — I110 Hypertensive heart disease with heart failure: Secondary | ICD-10-CM | POA: Diagnosis not present

## 2022-12-05 DIAGNOSIS — F0390 Unspecified dementia without behavioral disturbance: Secondary | ICD-10-CM | POA: Diagnosis not present

## 2022-12-05 DIAGNOSIS — Z7982 Long term (current) use of aspirin: Secondary | ICD-10-CM | POA: Insufficient documentation

## 2022-12-05 DIAGNOSIS — Y900 Blood alcohol level of less than 20 mg/100 ml: Secondary | ICD-10-CM | POA: Diagnosis not present

## 2022-12-05 DIAGNOSIS — M7918 Myalgia, other site: Secondary | ICD-10-CM | POA: Diagnosis not present

## 2022-12-05 LAB — COMPREHENSIVE METABOLIC PANEL
ALT: 14 U/L (ref 0–44)
AST: 21 U/L (ref 15–41)
Albumin: 3.6 g/dL (ref 3.5–5.0)
Alkaline Phosphatase: 65 U/L (ref 38–126)
Anion gap: 11 (ref 5–15)
BUN: 20 mg/dL (ref 8–23)
CO2: 24 mmol/L (ref 22–32)
Calcium: 9.1 mg/dL (ref 8.9–10.3)
Chloride: 95 mmol/L — ABNORMAL LOW (ref 98–111)
Creatinine, Ser: 1.42 mg/dL — ABNORMAL HIGH (ref 0.61–1.24)
GFR, Estimated: 50 mL/min — ABNORMAL LOW (ref >60)
Glucose, Bld: 102 mg/dL — ABNORMAL HIGH (ref 70–99)
Potassium: 5.1 mmol/L (ref 3.5–5.1)
Sodium: 130 mmol/L — ABNORMAL LOW (ref 135–145)
Total Bilirubin: 0.6 mg/dL (ref 0.3–1.2)
Total Protein: 7.1 g/dL (ref 6.5–8.1)

## 2022-12-05 LAB — URINALYSIS, ROUTINE W REFLEX MICROSCOPIC
Bilirubin Urine: NEGATIVE
Glucose, UA: NEGATIVE mg/dL
Hgb urine dipstick: NEGATIVE
Ketones, ur: NEGATIVE mg/dL
Leukocytes,Ua: NEGATIVE
Nitrite: NEGATIVE
Protein, ur: NEGATIVE mg/dL
Specific Gravity, Urine: 1.012 (ref 1.005–1.030)
pH: 6 (ref 5.0–8.0)

## 2022-12-05 LAB — DIFFERENTIAL
Abs Immature Granulocytes: 0.01 10*3/uL (ref 0.00–0.07)
Basophils Absolute: 0 10*3/uL (ref 0.0–0.1)
Basophils Relative: 1 %
Eosinophils Absolute: 0.1 10*3/uL (ref 0.0–0.5)
Eosinophils Relative: 2 %
Immature Granulocytes: 0 %
Lymphocytes Relative: 26 %
Lymphs Abs: 1.6 10*3/uL (ref 0.7–4.0)
Monocytes Absolute: 0.6 10*3/uL (ref 0.1–1.0)
Monocytes Relative: 9 %
Neutro Abs: 3.7 10*3/uL (ref 1.7–7.7)
Neutrophils Relative %: 62 %

## 2022-12-05 LAB — PROTIME-INR
INR: 1.1 (ref 0.8–1.2)
Prothrombin Time: 13.8 seconds (ref 11.4–15.2)

## 2022-12-05 LAB — RAPID URINE DRUG SCREEN, HOSP PERFORMED
Amphetamines: NOT DETECTED
Barbiturates: NOT DETECTED
Benzodiazepines: NOT DETECTED
Cocaine: NOT DETECTED
Opiates: NOT DETECTED
Tetrahydrocannabinol: NOT DETECTED

## 2022-12-05 LAB — CBC
HCT: 39.1 % (ref 39.0–52.0)
Hemoglobin: 13.4 g/dL (ref 13.0–17.0)
MCH: 27.9 pg (ref 26.0–34.0)
MCHC: 34.3 g/dL (ref 30.0–36.0)
MCV: 81.3 fL (ref 80.0–100.0)
Platelets: 202 10*3/uL (ref 150–400)
RBC: 4.81 MIL/uL (ref 4.22–5.81)
RDW: 14.8 % (ref 11.5–15.5)
WBC: 6 10*3/uL (ref 4.0–10.5)
nRBC: 0 % (ref 0.0–0.2)

## 2022-12-05 LAB — APTT: aPTT: 28 seconds (ref 24–36)

## 2022-12-05 LAB — VALPROIC ACID LEVEL: Valproic Acid Lvl: 38 ug/mL — ABNORMAL LOW (ref 50.0–100.0)

## 2022-12-05 LAB — ETHANOL: Alcohol, Ethyl (B): <10 mg/dL (ref <10)

## 2022-12-05 MED ORDER — ACETAMINOPHEN 500 MG PO TABS
1000.0000 mg | ORAL_TABLET | Freq: Once | ORAL | Status: AC
  Administered 2022-12-05: 1000 mg via ORAL
  Filled 2022-12-05: qty 2

## 2022-12-05 NOTE — Discharge Instructions (Signed)
Thank you for coming to [facility] Emergency Department. You were seen for left-sided pain. We did an exam, labs, and imaging, and these showed no acute findings.  Please follow up with your primary care provider on 5/10 as originally scheduled. Your valproic acid level (Depakote) is also mildly low. Please take it as prescribed and follow up with your neurologist about the dosing for this medication. You can take 650mg  tylenol (acetaminophen) every 4-6 hours as needed for pain.   Do not hesitate to return to the ED or call 911 if you experience: -Worsening symptoms -Lightheadedness, passing out -Fevers/chills -Anything else that concerns you

## 2022-12-05 NOTE — ED Notes (Signed)
Patient transported to CT 

## 2022-12-05 NOTE — ED Provider Triage Note (Signed)
Emergency Medicine Provider Triage Evaluation Note  Jeriel Vivanco. , a 82 y.o. male  was evaluated in triage.  Pt complains of headache and left side of body pain.  Review of Systems  Positive: headache Negative: fever  Physical Exam   Gen:   Awake, no distress   Resp:  Normal effort  MSK:   Moves extremities without difficulty  Other:  No arm/leg drift.  No obvious facial droop.    Medical Decision Making  Medically screening exam initiated at 6:23 AM.  Appropriate orders placed.  Cherylynn Ridges. was informed that the remainder of the evaluation will be completed by another provider, this initial triage assessment does not replace that evaluation, and the importance of remaining in the ED until their evaluation is complete.     Zadie Rhine, MD 12/05/22 [phone]

## 2022-12-05 NOTE — ED Provider Notes (Signed)
[place] EMERGENCY DEPARTMENT AT [facility] Provider Note   CSN: [id number] Arrival date & time: 12/05/22  4098     History  Chief Complaint  Patient presents with   Headache    Patient to ED via EMS with complaint of headache all the way down left side to left foot    Eric Barrett. is a 82 y.o. male with dementia, history of CVA with residual left-sided weakness, chronic diastolic heart failure, BPH, HTN, HLD, history of lumbar osteomyelitis, benign neoplasm of sigmoid colon, hyponatremia who presents with headache.   Patient presents with his wife who provides additional history.  Presents stating that he had left-sided headache as well as pain always on the left side of his body that began before 5 AM this morning.  Wife stated that he was hurting too badly to walk all the way to the bathroom and instead use the bedside commode that they have in their bedroom, which is abnormal for him.  Patient states that he does not have any pain anywhere particular including chest abdomen flank or joints.  Did not have any trauma or falls.  Has never had this pain before.  Not associated with any asymmetric weakness or numbness tingling like he had with his previous stroke.  Has not had any recent illnesses fevers or chills.  Since waiting to be seen patient states that his left-sided pain on his body has resolved and he has only a mild left-sided headache at this time.  No visual changes or neck pain.   Headache      Home Medications Prior to Admission medications   Medication Sig Start Date End Date Taking? Authorizing Provider  acetaminophen (TYLENOL) 325 MG tablet Take 2 tablets (650 mg total) by mouth every 6 (six) hours as needed for mild pain, fever or headache. 05/24/22   Elgergawy, Leana Roe, MD  amLODipine (NORVASC) 10 MG tablet Take 10 mg by mouth daily. 07/08/21   [provider]  ASPIRIN 81 PO Take 81 mg by mouth daily.    [provider]   bisacodyl (DULCOLAX) 5 MG EC tablet Take 1 tablet (5 mg total) by mouth daily as needed for moderate constipation. 07/24/22   Joseph Art, DO  Cholecalciferol (VITAMIN D3) 25 MCG (1000 UT) CAPS Take 1,000 Units by mouth daily.    [provider]  divalproex (DEPAKOTE ER) 500 MG 24 hr tablet Take 1 tablet (500 mg total) by mouth at bedtime. 01/19/22   Levert Feinstein, MD  docusate sodium (COLACE) 100 MG capsule Take 100 mg by mouth daily.    [provider]  finasteride (PROSCAR) 5 MG tablet Take 5 mg by mouth daily. 08/24/21   [provider]  GAVILAX 17 GM/SCOOP powder Take 17 g by mouth daily as needed for mild constipation. 04/26/22   [provider]  lidocaine (LIDODERM) 5 % Place 1 patch onto the skin daily. Remove & Discard patch within 12 hours or as directed by MD 07/24/22   Joseph Art, DO  memantine (NAMENDA) 10 MG tablet Take 10 mg by mouth 2 (two) times daily. 02/08/22   [provider]  morphine (MS CONTIN) 15 MG 12 hr tablet Take 1 tablet (15 mg total) by mouth every 12 (twelve) hours. 07/24/22   Joseph Art, DO  multivitamin-lutein (OCUVITE-LUTEIN) CAPS capsule Take 1 capsule by mouth daily.    [provider]  MYRBETRIQ 25 MG TB24 tablet Take 25 mg by mouth  daily. 11/02/21   [provider]  oxyCODONE (OXY IR/ROXICODONE) 5 MG immediate release tablet Take 1 tablet (5 mg total) by mouth every 4 (four) hours as needed for moderate pain. 07/24/22   Joseph Art, DO  rosuvastatin (CRESTOR) 20 MG tablet Take 20 mg by mouth daily.    [provider]  senna (SENOKOT) 8.6 MG TABS tablet Take 1 tablet (8.6 mg total) by mouth in the morning and at bedtime. 07/24/22   Joseph Art, DO  tamsulosin (FLOMAX) 0.4 MG CAPS capsule Take 0.4 mg by mouth daily. 10/29/20   [provider]  Travoprost, BAK Free, (TRAVATAN) 0.004 % SOLN ophthalmic solution Place 1 drop into both eyes at bedtime.    [provider]      Allergies    Beef allergy, Caffeine, and Chocolate    Review of Systems   Review of Systems  Neurological:  Positive for headaches.   Review of systems Negative for f/c, head trauma.  A 10 point review of systems was performed and is negative unless otherwise reported in HPI.  Physical Exam Updated Vital Signs BP 133/81   Pulse 67   Temp 98.1 F (36.7 C) (Oral)   Resp 19   Ht 5\' 7"  (1.702 m)   Wt 83 kg   SpO2 99%   BMI 28.66 kg/m  Physical Exam General: Normal appearing male, lying in bed.  HEENT: PERRLA, Sclera anicteric, MMM, trachea midline. FROM of neck. NCAT. No nuchal rigidity. Cardiology: RRR, no murmurs/rubs/gallops. BL radial and DP pulses equal bilaterally.  Resp: Normal respiratory rate and effort. CTAB, no wheezes, rhonchi, crackles.  Abd: Soft, non-tender, non-distended. No rebound tenderness or guarding.  GU: Deferred. MSK: No peripheral edema or signs of trauma. Extremities without deformity or TTP. No cyanosis or clubbing. Skin: warm, dry. No rashes or lesions. Back: No CVA tenderness Neuro: A&Ox4, CNs II-XII grossly intact. MAEs. Sensation grossly intact.  Psych: Normal mood and affect.   ED Results / Procedures / Treatments   Labs (all labs ordered are listed, but only abnormal results are displayed) Labs Reviewed  COMPREHENSIVE METABOLIC PANEL - Abnormal; Notable for the following components:      Result Value   Sodium 130 (*)    Chloride 95 (*)    Glucose, Bld 102 (*)    Creatinine, Ser 1.42 (*)    GFR, Estimated 50 (*)    All other components within normal limits  VALPROIC ACID LEVEL - Abnormal; Notable for the following components:   Valproic Acid Lvl 38 (*)    All other components within normal limits  ETHANOL  PROTIME-INR  APTT  CBC  DIFFERENTIAL  URINALYSIS, ROUTINE W REFLEX MICROSCOPIC  RAPID URINE DRUG SCREEN, HOSP PERFORMED    EKG EKG Interpretation  Date/Time:  Tuesday December 05 2022 06:35:40 EDT Ventricular Rate:   73 PR Interval:  158 QRS Duration: 90 QT Interval:  372 QTC Calculation: 410 R Axis:   88 Text Interpretation: Sinus rhythm Borderline right axis deviation Confirmed by Zadie Rhine ([id number]) on 12/05/2022 6:41:39 AM  Radiology CT HEAD WO CONTRAST  Result Date: 12/05/2022 CLINICAL DATA:  New onset headache this morning. EXAM: CT HEAD WITHOUT CONTRAST TECHNIQUE: Contiguous axial images were obtained from the base of the skull through the vertex without intravenous contrast. RADIATION DOSE REDUCTION: This exam was performed according to the departmental dose-optimization program which includes automated exposure control, adjustment of the mA and/or kV according to patient size and/or use of iterative  reconstruction technique. COMPARISON:  CT head dated January 06, 2022. FINDINGS: Brain: No evidence of acute infarction, hemorrhage, hydrocephalus, extra-axial collection or mass lesion/mass effect. Stable mild atrophy and chronic microvascular ischemic changes. Vascular: No hyperdense vessel or unexpected calcification. Skull: Normal. Negative for fracture or focal lesion. Sinuses/Orbits: No acute finding. Other: None. IMPRESSION: 1. No acute intracranial abnormality. Stable mild atrophy and chronic microvascular ischemic changes. Electronically Signed   By: Obie Dredge M.D.   On: 12/05/2022 08:10    Procedures Procedures    Medications Ordered in ED Medications  acetaminophen (TYLENOL) tablet 1,000 mg (1,000 mg Oral Given 12/05/22 0820)    ED Course/ Medical Decision Making/ A&P                          Medical Decision Making Amount and/or Complexity of Data Reviewed Labs: ordered. Decision-making details documented in ED Course. Radiology: ordered. Decision-making details documented in ED Course.  Risk OTC drugs.    This patient presents to the ED for concern of left-sided body pain and left sided headache, this involves an extensive number of treatment options, and is a complaint  that carries with it a high risk of complications and morbidity. Patient is overall very well-appearing, HDS.   MDM:    Patient has report of left-sided body pain and he cannot give any more specific details or localizing over the pain than that.  On exam he has no tenderness palpation of the left side of his chest abdomen or flank.  He has no left hip tenderness.  He has no tenderness to palpation of his left arm or leg with no swollen joints to indicate septic arthritis or trauma, and he has full strength/NVI so no c/f acute CVA or compartment syndrome.  No UTI to indicate pyelonephritis, no hematuria to indicate nephrolithiasis/ureterolithiasis. Considered electrolyte derangements, patient w/ hyponatremia that is chronic and stable. Denies CP/abd pain, N/V, low c/f acute intrathoracic/intraabdominal life threatening pathology.   For his headache, he has never had a headache like this before so CTH obtained from triage which shows no acute findings. Consider migraine, tension headache. No ocular pain or abnormalities to suggest glaucoma, no facial droop/deficits to suggest CVA. Wife with no headache, low c/f CO poisoning. Consider also tension type headache. No neck TTP and has FROM of neck, low c/f cervicogenic headache or cervical spine abnormality. No fevers/chills, no nuchal rigidity to suggest meningitis. Improved significantly with tylenol. Very low c/f life threatening etiology of headache, and patient can take tylenol at home and f/u as o/p.Marland Kitchen    Clinical Course as of 12/05/22 0923  Tue Dec 05, 2022  0816 Creatinine(!): 1.42 At approximate baseline [HN]  0816 Sodium(!): 130 C/w prior values, has chronic hyponatremia [HN]  0816 Alcohol, Ethyl (B): <10 [HN]  0816 INR: 1.1 [HN]  0816 Prothrombin Time: 13.8 [HN]  0816 APTT: 28 [HN]  0816 Valproic Acid,S(!): 38 Subtherapeutic [HN]  0817 CBC wnl [HN]  0817 CT HEAD WO CONTRAST 1. No acute intracranial abnormality. Stable mild atrophy and  chronic microvascular ischemic changes.   [HN]  [id number] Patient's symptoms are resolved with tylenol. Patient is HDS, well-appearing, symptoms resolved.  Patient already has an appointment scheduled for his primary care provider on 12/15/2022.  Discharged to the care of his wife in stable condition. DC w/ discharge instructions/return precautions. All questions answered to patient's satisfaction.   [HN]    Clinical Course User Index [HN] Loetta Rough, MD  Labs: I Ordered, and personally interpreted labs.  The pertinent results include:  those listed above  Imaging Studies ordered: Imaging studies including CTH ordered from triage I independently visualized and interpreted imaging. I agree with the radiologist interpretation  Additional history obtained from wife at bedside, chart review.    Reevaluation: After the interventions noted above, I reevaluated the patient and found that they have :resolved  Social Determinants of Health: Patient lives independently   Disposition:  DC w/ discharge instructions/return precautions. All questions answered to patient's satisfaction.    Co morbidities that complicate the patient evaluation  Past Medical History:  Diagnosis Date   BPH (benign prostatic hyperplasia)    Chronic diastolic (congestive) heart failure (HCC)    Dementia (HCC)    Glaucoma    Hemiparesis of left nondominant side (HCC)    History of shingles    HLD (hyperlipidemia)    Hypertension    Stroke (HCC)    Tremor      Medicines Meds ordered this encounter  Medications   acetaminophen (TYLENOL) tablet 1,000 mg    I have reviewed the patients home medicines and have made adjustments as needed  Problem List / ED Course: Problem List Items Addressed This Visit   None Visit Diagnoses     Acute nonintractable headache, unspecified headache type    -  Primary   Relevant Medications   acetaminophen (TYLENOL) tablet 1,000 mg (Completed)   Pain of left side of  body                       This note was created using dictation software, which may contain spelling or grammatical errors.    Loetta Rough, MD 12/05/22 [phone]

## 2022-12-05 NOTE — Telephone Encounter (Signed)
I reviewed the ER note from today December 05, 2022.  He presented with headache.  Left-sided, as well as left-sided body pain.  Significantly improved with Tylenol. Labs showed baseline elevated creatinine 1.42, chronic hyponatremia 130, Depakote level 38.  CT head showed no acute abnormality.  His symptoms resolved with Tylenol.  He was discharged home.  CT head IMPRESSION: 1. No acute intracranial abnormality. Stable mild atrophy and chronic microvascular ischemic changes.  Please call, If headaches continue, follow-up at our office.  For now, Can continue with current dose of Depakote ER 500 mg daily for seizure prevention (have been concerned about complex partial seizures). No need to increase unless seizure spells reoccur or migraine type headaches develop. Depakote can be used as a migraine preventative as well.  With latest blood draw, unclear when was collected in regarded in dosing.

## 2022-12-05 NOTE — Telephone Encounter (Signed)
Pt wife stated pt was at the emergency room this morning. Stated she was told to follow-up with Maralyn Sago because pt divalproex (DEPAKOTE ER) 500 MG 24 hr tablet levels are low.

## 2022-12-06 NOTE — Telephone Encounter (Signed)
I spoke to wife on DPR and she was agreeable to sticking with her appointment in August as patient is not having headaches, she will continue to give the regular dose of depakote and reach out if she has anymore concerns.

## 2022-12-18 ENCOUNTER — Other Ambulatory Visit: Payer: Self-pay | Admitting: Family

## 2022-12-18 DIAGNOSIS — M25512 Pain in left shoulder: Secondary | ICD-10-CM

## 2022-12-28 ENCOUNTER — Encounter: Payer: Self-pay | Admitting: Family

## 2022-12-31 ENCOUNTER — Other Ambulatory Visit: Payer: Medicare HMO

## 2023-01-04 ENCOUNTER — Ambulatory Visit: Payer: Medicare HMO | Admitting: Podiatry

## 2023-01-29 ENCOUNTER — Encounter: Payer: Self-pay | Admitting: Podiatry

## 2023-01-29 ENCOUNTER — Ambulatory Visit (INDEPENDENT_AMBULATORY_CARE_PROVIDER_SITE_OTHER): Payer: Medicare HMO | Admitting: Podiatry

## 2023-01-29 DIAGNOSIS — B351 Tinea unguium: Secondary | ICD-10-CM

## 2023-01-29 DIAGNOSIS — M79674 Pain in right toe(s): Secondary | ICD-10-CM

## 2023-01-29 DIAGNOSIS — M79675 Pain in left toe(s): Secondary | ICD-10-CM

## 2023-01-29 NOTE — Progress Notes (Signed)
This patient presents to the office with chief complaint of long thick painful nails.  Patient says the nails are painful walking and wearing shoes.  This patient is unable to self treat.  This patient is unable to trim his nails since she is unable to reach his nails.  he presents to the office for preventative foot care services. He presents to the office with male caregiver.  General Appearance  Alert, conversant and in no acute stress.  Vascular  Dorsalis pedis and posterior tibial  pulses are palpable  bilaterally.  Capillary return is within normal limits  bilaterally. Temperature is within normal limits  bilaterally.  Neurologic  Senn-Weinstein monofilament wire test within normal limits  bilaterally. Muscle power within normal limits bilaterally.  Nails Thick disfigured discolored nails with subungual debris  from hallux to fifth toes bilaterally. No evidence of bacterial infection or drainage bilaterally.  Orthopedic  No limitations of motion  feet .  No crepitus or effusions noted.  No bony pathology or digital deformities noted.  Skin  normotropic skin with no porokeratosis noted bilaterally.  No signs of infections or ulcers noted.     Onychomycosis  Nails  B/L.  Pain in right toes  Pain in left toes  Debridement of nails both feet followed trimming the nails with dremel tool.    RTC 3 months.   Helane Gunther DPM

## 2023-02-01 ENCOUNTER — Inpatient Hospital Stay (HOSPITAL_COMMUNITY)
Admission: EM | Admit: 2023-02-01 | Discharge: 2023-02-03 | DRG: 644 | Disposition: A | Discharge status: Home Health | Payer: Medicare HMO | Attending: Internal Medicine | Admitting: Internal Medicine

## 2023-02-01 ENCOUNTER — Other Ambulatory Visit: Payer: Self-pay

## 2023-02-01 ENCOUNTER — Emergency Department (HOSPITAL_COMMUNITY): Payer: Medicare HMO

## 2023-02-01 ENCOUNTER — Encounter (HOSPITAL_COMMUNITY): Payer: Self-pay

## 2023-02-01 DIAGNOSIS — Z7982 Long term (current) use of aspirin: Secondary | ICD-10-CM

## 2023-02-01 DIAGNOSIS — I69354 Hemiplegia and hemiparesis following cerebral infarction affecting left non-dominant side: Secondary | ICD-10-CM

## 2023-02-01 DIAGNOSIS — N39498 Other specified urinary incontinence: Secondary | ICD-10-CM | POA: Diagnosis present

## 2023-02-01 DIAGNOSIS — K5900 Constipation, unspecified: Secondary | ICD-10-CM | POA: Diagnosis present

## 2023-02-01 DIAGNOSIS — F0390 Unspecified dementia without behavioral disturbance: Secondary | ICD-10-CM | POA: Diagnosis present

## 2023-02-01 DIAGNOSIS — N1831 Chronic kidney disease, stage 3a: Secondary | ICD-10-CM | POA: Diagnosis present

## 2023-02-01 DIAGNOSIS — E871 Hypo-osmolality and hyponatremia: Secondary | ICD-10-CM | POA: Diagnosis not present

## 2023-02-01 DIAGNOSIS — Z8619 Personal history of other infectious and parasitic diseases: Secondary | ICD-10-CM

## 2023-02-01 DIAGNOSIS — R338 Other retention of urine: Secondary | ICD-10-CM | POA: Diagnosis present

## 2023-02-01 DIAGNOSIS — E785 Hyperlipidemia, unspecified: Secondary | ICD-10-CM | POA: Diagnosis present

## 2023-02-01 DIAGNOSIS — N3941 Urge incontinence: Secondary | ICD-10-CM | POA: Diagnosis present

## 2023-02-01 DIAGNOSIS — N401 Enlarged prostate with lower urinary tract symptoms: Secondary | ICD-10-CM | POA: Diagnosis present

## 2023-02-01 DIAGNOSIS — R531 Weakness: Secondary | ICD-10-CM | POA: Diagnosis not present

## 2023-02-01 DIAGNOSIS — E872 Acidosis, unspecified: Secondary | ICD-10-CM | POA: Diagnosis present

## 2023-02-01 DIAGNOSIS — Z8249 Family history of ischemic heart disease and other diseases of the circulatory system: Secondary | ICD-10-CM | POA: Diagnosis not present

## 2023-02-01 DIAGNOSIS — N3281 Overactive bladder: Secondary | ICD-10-CM | POA: Diagnosis present

## 2023-02-01 DIAGNOSIS — E222 Syndrome of inappropriate secretion of antidiuretic hormone: Principal | ICD-10-CM | POA: Diagnosis present

## 2023-02-01 DIAGNOSIS — I5032 Chronic diastolic (congestive) heart failure: Secondary | ICD-10-CM | POA: Diagnosis present

## 2023-02-01 DIAGNOSIS — I130 Hypertensive heart and chronic kidney disease with heart failure and stage 1 through stage 4 chronic kidney disease, or unspecified chronic kidney disease: Secondary | ICD-10-CM | POA: Diagnosis present

## 2023-02-01 DIAGNOSIS — H409 Unspecified glaucoma: Secondary | ICD-10-CM | POA: Diagnosis present

## 2023-02-01 DIAGNOSIS — M545 Low back pain, unspecified: Secondary | ICD-10-CM | POA: Diagnosis present

## 2023-02-01 DIAGNOSIS — R251 Tremor, unspecified: Secondary | ICD-10-CM | POA: Diagnosis present

## 2023-02-01 DIAGNOSIS — H269 Unspecified cataract: Secondary | ICD-10-CM | POA: Diagnosis present

## 2023-02-01 DIAGNOSIS — Z87891 Personal history of nicotine dependence: Secondary | ICD-10-CM | POA: Diagnosis not present

## 2023-02-01 DIAGNOSIS — I739 Peripheral vascular disease, unspecified: Secondary | ICD-10-CM | POA: Diagnosis present

## 2023-02-01 DIAGNOSIS — G40909 Epilepsy, unspecified, not intractable, without status epilepticus: Secondary | ICD-10-CM | POA: Diagnosis present

## 2023-02-01 DIAGNOSIS — Z79899 Other long term (current) drug therapy: Secondary | ICD-10-CM | POA: Diagnosis not present

## 2023-02-01 LAB — CBC WITH DIFFERENTIAL/PLATELET
Abs Immature Granulocytes: 0.02 10*3/uL (ref 0.00–0.07)
Basophils Absolute: 0 10*3/uL (ref 0.0–0.1)
Basophils Relative: 1 %
Eosinophils Absolute: 0.1 10*3/uL (ref 0.0–0.5)
Eosinophils Relative: 1 %
HCT: 41.7 % (ref 39.0–52.0)
Hemoglobin: 14.8 g/dL (ref 13.0–17.0)
Immature Granulocytes: 0 %
Lymphocytes Relative: 27 %
Lymphs Abs: 1.9 10*3/uL (ref 0.7–4.0)
MCH: 28.8 pg (ref 26.0–34.0)
MCHC: 35.5 g/dL (ref 30.0–36.0)
MCV: 81.3 fL (ref 80.0–100.0)
Monocytes Absolute: 0.7 10*3/uL (ref 0.1–1.0)
Monocytes Relative: 9 %
Neutro Abs: 4.4 10*3/uL (ref 1.7–7.7)
Neutrophils Relative %: 62 %
Platelets: 208 10*3/uL (ref 150–400)
RBC: 5.13 MIL/uL (ref 4.22–5.81)
RDW: 13.2 % (ref 11.5–15.5)
WBC: 7.1 10*3/uL (ref 4.0–10.5)
nRBC: 0 % (ref 0.0–0.2)

## 2023-02-01 LAB — COMPREHENSIVE METABOLIC PANEL
ALT: 15 U/L (ref 0–44)
AST: 25 U/L (ref 15–41)
Albumin: 3.6 g/dL (ref 3.5–5.0)
Alkaline Phosphatase: 57 U/L (ref 38–126)
Anion gap: 14 (ref 5–15)
BUN: 15 mg/dL (ref 8–23)
CO2: 20 mmol/L — ABNORMAL LOW (ref 22–32)
Calcium: 8.8 mg/dL — ABNORMAL LOW (ref 8.9–10.3)
Chloride: 83 mmol/L — ABNORMAL LOW (ref 98–111)
Creatinine, Ser: 1.35 mg/dL — ABNORMAL HIGH (ref 0.61–1.24)
GFR, Estimated: 53 mL/min — ABNORMAL LOW (ref >60)
Glucose, Bld: 129 mg/dL — ABNORMAL HIGH (ref 70–99)
Potassium: 4.9 mmol/L (ref 3.5–5.1)
Sodium: 117 mmol/L — CL (ref 135–145)
Total Bilirubin: 0.9 mg/dL (ref 0.3–1.2)
Total Protein: 7 g/dL (ref 6.5–8.1)

## 2023-02-01 LAB — URINALYSIS, W/ REFLEX TO CULTURE (INFECTION SUSPECTED)
Bacteria, UA: NONE SEEN
Bilirubin Urine: NEGATIVE
Glucose, UA: NEGATIVE mg/dL
Hgb urine dipstick: NEGATIVE
Ketones, ur: NEGATIVE mg/dL
Leukocytes,Ua: NEGATIVE
Nitrite: NEGATIVE
Protein, ur: NEGATIVE mg/dL
Specific Gravity, Urine: 1.009 (ref 1.005–1.030)
pH: 7 (ref 5.0–8.0)

## 2023-02-01 LAB — VALPROIC ACID LEVEL: Valproic Acid Lvl: 27 ug/mL — ABNORMAL LOW (ref 50.0–100.0)

## 2023-02-01 LAB — GLUCOSE, CAPILLARY
Glucose-Capillary: 172 mg/dL — ABNORMAL HIGH (ref 70–99)
Glucose-Capillary: 158 mg/dL — ABNORMAL HIGH (ref 70–99)
Glucose-Capillary: 99 mg/dL (ref 70–99)

## 2023-02-01 LAB — MAGNESIUM: Magnesium: 1.9 mg/dL (ref 1.7–2.4)

## 2023-02-01 LAB — RENAL FUNCTION PANEL
Albumin: 3.3 g/dL — ABNORMAL LOW (ref 3.5–5.0)
Anion gap: 9 (ref 5–15)
BUN: 13 mg/dL (ref 8–23)
CO2: 21 mmol/L — ABNORMAL LOW (ref 22–32)
Calcium: 8.3 mg/dL — ABNORMAL LOW (ref 8.9–10.3)
Chloride: 85 mmol/L — ABNORMAL LOW (ref 98–111)
Creatinine, Ser: 1.15 mg/dL (ref 0.61–1.24)
GFR, Estimated: >60 mL/min (ref >60)
Glucose, Bld: 110 mg/dL — ABNORMAL HIGH (ref 70–99)
Phosphorus: 3.4 mg/dL (ref 2.5–4.6)
Potassium: 5.1 mmol/L (ref 3.5–5.1)
Sodium: 115 mmol/L — CL (ref 135–145)

## 2023-02-01 LAB — BASIC METABOLIC PANEL
Anion gap: 15 (ref 5–15)
Anion gap: 8 (ref 5–15)
Anion gap: 7 (ref 5–15)
BUN: 12 mg/dL (ref 8–23)
BUN: 14 mg/dL (ref 8–23)
BUN: 14 mg/dL (ref 8–23)
CO2: 19 mmol/L — ABNORMAL LOW (ref 22–32)
CO2: 24 mmol/L (ref 22–32)
CO2: 25 mmol/L (ref 22–32)
Calcium: 8.6 mg/dL — ABNORMAL LOW (ref 8.9–10.3)
Calcium: 8.6 mg/dL — ABNORMAL LOW (ref 8.9–10.3)
Calcium: 8.8 mg/dL — ABNORMAL LOW (ref 8.9–10.3)
Chloride: 84 mmol/L — ABNORMAL LOW (ref 98–111)
Chloride: 86 mmol/L — ABNORMAL LOW (ref 98–111)
Chloride: 87 mmol/L — ABNORMAL LOW (ref 98–111)
Creatinine, Ser: 1.31 mg/dL — ABNORMAL HIGH (ref 0.61–1.24)
Creatinine, Ser: 1.33 mg/dL — ABNORMAL HIGH (ref 0.61–1.24)
Creatinine, Ser: 1.36 mg/dL — ABNORMAL HIGH (ref 0.61–1.24)
GFR, Estimated: 55 mL/min — ABNORMAL LOW (ref >60)
GFR, Estimated: 54 mL/min — ABNORMAL LOW (ref >60)
GFR, Estimated: 52 mL/min — ABNORMAL LOW (ref >60)
Glucose, Bld: 159 mg/dL — ABNORMAL HIGH (ref 70–99)
Glucose, Bld: 131 mg/dL — ABNORMAL HIGH (ref 70–99)
Glucose, Bld: 150 mg/dL — ABNORMAL HIGH (ref 70–99)
Potassium: 5.5 mmol/L — ABNORMAL HIGH (ref 3.5–5.1)
Potassium: 4.9 mmol/L (ref 3.5–5.1)
Potassium: 4.8 mmol/L (ref 3.5–5.1)
Sodium: 118 mmol/L — CL (ref 135–145)
Sodium: 118 mmol/L — CL (ref 135–145)
Sodium: 119 mmol/L — CL (ref 135–145)

## 2023-02-01 LAB — LACTIC ACID, PLASMA
Lactic Acid, Venous: 3.3 mmol/L (ref 0.5–1.9)
Lactic Acid, Venous: 0.9 mmol/L (ref 0.5–1.9)

## 2023-02-01 LAB — T4, FREE: Free T4: 0.94 ng/dL (ref 0.61–1.12)

## 2023-02-01 LAB — TSH: TSH: 0.868 u[IU]/mL (ref 0.350–4.500)

## 2023-02-01 LAB — SODIUM: Sodium: 126 mmol/L — ABNORMAL LOW (ref 135–145)

## 2023-02-01 LAB — PHOSPHORUS: Phosphorus: 3.4 mg/dL (ref 2.5–4.6)

## 2023-02-01 LAB — OSMOLALITY
Osmolality: 258 mOsm/kg — ABNORMAL LOW (ref 275–295)
Osmolality: 257 mOsm/kg — ABNORMAL LOW (ref 275–295)

## 2023-02-01 LAB — OSMOLALITY, URINE: Osmolality, Ur: 352 mOsm/kg (ref 300–900)

## 2023-02-01 LAB — APTT: aPTT: 27 seconds (ref 24–36)

## 2023-02-01 LAB — PROTIME-INR
INR: 1.1 (ref 0.8–1.2)
Prothrombin Time: 14.1 seconds (ref 11.4–15.2)

## 2023-02-01 LAB — SODIUM, URINE, RANDOM: Sodium, Ur: 77 mmol/L

## 2023-02-01 LAB — CK: Total CK: 190 U/L (ref 49–397)

## 2023-02-01 MED ORDER — LATANOPROST 0.005 % OP SOLN
1.0000 [drp] | Freq: Every day | OPHTHALMIC | Status: DC
  Administered 2023-02-01 – 2023-02-02 (×2): 1 [drp] via OPHTHALMIC
  Filled 2023-02-01: qty 2.5

## 2023-02-01 MED ORDER — ONDANSETRON HCL 4 MG/2ML IJ SOLN
4.0000 mg | Freq: Four times a day (QID) | INTRAMUSCULAR | Status: DC | PRN
  Administered 2023-02-01: 4 mg via INTRAVENOUS
  Filled 2023-02-01: qty 2

## 2023-02-01 MED ORDER — MEMANTINE HCL 10 MG PO TABS
10.0000 mg | ORAL_TABLET | Freq: Two times a day (BID) | ORAL | Status: DC
  Administered 2023-02-01 – 2023-02-03 (×5): 10 mg via ORAL
  Filled 2023-02-01 (×6): qty 1

## 2023-02-01 MED ORDER — ASPIRIN 81 MG PO TBEC
81.0000 mg | DELAYED_RELEASE_TABLET | Freq: Every day | ORAL | Status: DC
  Administered 2023-02-01 – 2023-02-03 (×3): 81 mg via ORAL
  Filled 2023-02-01 (×3): qty 1

## 2023-02-01 MED ORDER — RIVAROXABAN 10 MG PO TABS
10.0000 mg | ORAL_TABLET | Freq: Every day | ORAL | Status: DC
  Administered 2023-02-01 – 2023-02-03 (×3): 10 mg via ORAL
  Filled 2023-02-01 (×3): qty 1

## 2023-02-01 MED ORDER — DIVALPROEX SODIUM ER 500 MG PO TB24
500.0000 mg | ORAL_TABLET | Freq: Every day | ORAL | Status: DC
  Administered 2023-02-01 – 2023-02-02 (×2): 500 mg via ORAL
  Filled 2023-02-01 (×3): qty 1

## 2023-02-01 MED ORDER — SODIUM CHLORIDE 3 % IV SOLN
INTRAVENOUS | Status: DC
  Filled 2023-02-01 (×2): qty 500

## 2023-02-01 MED ORDER — SENNA 8.6 MG PO TABS
1.0000 | ORAL_TABLET | Freq: Two times a day (BID) | ORAL | Status: DC
  Administered 2023-02-01 – 2023-02-03 (×4): 8.6 mg via ORAL
  Filled 2023-02-01 (×4): qty 1

## 2023-02-01 MED ORDER — SODIUM CHLORIDE 0.9 % IV BOLUS
1000.0000 mL | Freq: Once | INTRAVENOUS | Status: AC
  Administered 2023-02-01: 1000 mL via INTRAVENOUS

## 2023-02-01 MED ORDER — ROSUVASTATIN CALCIUM 20 MG PO TABS
20.0000 mg | ORAL_TABLET | Freq: Every day | ORAL | Status: DC
  Administered 2023-02-01 – 2023-02-03 (×3): 20 mg via ORAL
  Filled 2023-02-01 (×3): qty 1

## 2023-02-01 MED ORDER — CHLORHEXIDINE GLUCONATE CLOTH 2 % EX PADS
6.0000 | MEDICATED_PAD | Freq: Every day | CUTANEOUS | Status: DC
  Administered 2023-02-01: 6 via TOPICAL

## 2023-02-01 MED ORDER — OCUVITE-LUTEIN PO CAPS
1.0000 | ORAL_CAPSULE | Freq: Every day | ORAL | Status: DC
  Filled 2023-02-01: qty 1

## 2023-02-01 MED ORDER — ACETAMINOPHEN 325 MG PO TABS: 650.0000 mg | ORAL_TABLET | Freq: Four times a day (QID) | ORAL | Status: DC | PRN

## 2023-02-01 MED ORDER — MIRABEGRON ER 25 MG PO TB24
25.0000 mg | ORAL_TABLET | Freq: Every day | ORAL | Status: DC
  Administered 2023-02-01 – 2023-02-03 (×3): 25 mg via ORAL
  Filled 2023-02-01 (×3): qty 1

## 2023-02-01 MED ORDER — TAMSULOSIN HCL 0.4 MG PO CAPS
0.4000 mg | ORAL_CAPSULE | Freq: Every day | ORAL | Status: DC
  Administered 2023-02-01 – 2023-02-03 (×3): 0.4 mg via ORAL
  Filled 2023-02-01 (×3): qty 1

## 2023-02-01 MED ORDER — AMLODIPINE BESYLATE 10 MG PO TABS
10.0000 mg | ORAL_TABLET | Freq: Every day | ORAL | Status: DC
  Administered 2023-02-01 – 2023-02-03 (×3): 10 mg via ORAL
  Filled 2023-02-01: qty 1
  Filled 2023-02-01: qty 2
  Filled 2023-02-01: qty 1

## 2023-02-01 MED ORDER — DOCUSATE SODIUM 100 MG PO CAPS
100.0000 mg | ORAL_CAPSULE | Freq: Every day | ORAL | Status: DC
  Administered 2023-02-01 – 2023-02-03 (×3): 100 mg via ORAL
  Filled 2023-02-01 (×3): qty 1

## 2023-02-01 MED ORDER — FINASTERIDE 5 MG PO TABS
5.0000 mg | ORAL_TABLET | Freq: Every day | ORAL | Status: DC
  Administered 2023-02-01 – 2023-02-03 (×3): 5 mg via ORAL
  Filled 2023-02-01 (×3): qty 1

## 2023-02-01 MED ORDER — PROSIGHT PO TABS
1.0000 | ORAL_TABLET | Freq: Every day | ORAL | Status: DC
  Administered 2023-02-01 – 2023-02-03 (×3): 1 via ORAL
  Filled 2023-02-01 (×3): qty 1

## 2023-02-01 NOTE — ED Notes (Signed)
ED TO INPATIENT HANDOFF REPORT  ED Nurse Name and Phone #: [phone]  S Name/Age/Gender Eric Barrett. 82 y.o. male Room/Bed: 023C/023C  Code Status   Code Status: Full Code  Home/SNF/Other Home Patient oriented to: self, place, time, and situation Is this baseline? Yes   Triage Complete: Triage complete  Chief Complaint Hyponatremia [E87.1]  Triage Note No notes on file   Allergies Allergies  Allergen Reactions   Beef Allergy Other (See Comments)    Per patient's wife, outpatient GI removed a precancerous polyp and advised patient should avoid chocolate.    Caffeine Other (See Comments)    Per patient's wife, outpatient GI removed a precancerous polyp and advised patient should avoid chocolate.    Chocolate Other (See Comments)    Per patient's wife, outpatient GI removed a precancerous polyp and advised patient should avoid chocolate.     Level of Care/Admitting Diagnosis ED Disposition     ED Disposition  Admit   Condition  --   Comment  Hospital Area: [facility] [[id number]]  Level of Care: Progressive [102]  Admit to Progressive based on following criteria: MULTISYSTEM THREATS such as stable sepsis, metabolic/electrolyte imbalance with or without encephalopathy that is responding to early treatment.  May admit patient to [facility] or [facility] if equivalent level of care is available:: No  Covid Evaluation: Asymptomatic - no recent exposure (last 10 days) testing not required  Diagnosis: Hyponatremia [198519]  Admitting Physician: Mercie Eon [[id number]]  Attending Physician: Mercie Eon [[id number]]  Certification:: I certify this patient will need inpatient services for at least 2 midnights  Estimated Length of Stay: 2          B Medical/Surgery History Past Medical History:  Diagnosis Date   BPH (benign prostatic hyperplasia)    Chronic diastolic (congestive) heart failure (HCC)    Dementia (HCC)    Glaucoma     Hemiparesis of left nondominant side (HCC)    History of shingles    HLD (hyperlipidemia)    Hypertension    Stroke [facility])    Tremor    Past Surgical History:  Procedure Laterality Date   COLONOSCOPY WITH PROPOFOL N/A 05/24/2022   Procedure: COLONOSCOPY WITH PROPOFOL;  Surgeon: Meryl Dare, MD;  Location: [facility] ENDOSCOPY;  Service: Gastroenterology;  Laterality: N/A;   IR LUMBAR DISC ASPIRATION W/IMG GUIDE  07/10/2022   No past surgery     POLYPECTOMY  05/24/2022   Procedure: POLYPECTOMY;  Surgeon: Meryl Dare, MD;  Location: [facility] ENDOSCOPY;  Service: Gastroenterology;;     A IV Location/Drains/Wounds Patient Lines/Drains/Airways Status     Active Line/Drains/Airways     Name Placement date Placement time Site Days   Peripheral IV 02/01/23 18 G Left Forearm 02/01/23  0534  Forearm  less than 1   Peripheral IV 02/01/23 18 G Left Antecubital 02/01/23  0534  Antecubital  less than 1            Intake/Output Last 24 hours  Intake/Output Summary (Last 24 hours) at 02/01/2023 1224 Last data filed at 02/01/2023 0750 Gross per 24 hour  Intake 1000 ml  Output 850 ml  Net 150 ml    Labs/Imaging Results for orders placed or performed during the hospital encounter of 02/01/23 (from the past 48 hour(s))  Lactic acid, plasma     Status: Abnormal   Collection Time: 02/01/23  5:24 AM  Result Value Ref Range   Lactic Acid, Venous 3.3 (HH) 0.5 -  1.9 mmol/L    Comment: CRITICAL RESULT CALLED TO, READ BACK BY AND VERIFIED WITH Henrene Pastor, RN. [phone] 02/01/23. LPAIT Performed at [facility], [address], [place], [place] [address]   Comprehensive metabolic panel     Status: Abnormal   Collection Time: 02/01/23  5:24 AM  Result Value Ref Range   Sodium 117 (LL) 135 - 145 mmol/L    Comment: CRITICAL RESULT CALLED TO, READ BACK BY AND VERIFIED WITH Henrene Pastor, RN. [phone] 02/01/23. LPAIT   Potassium 4.9 3.5 - 5.1 mmol/L   Chloride 83 (L) 98 - 111 mmol/L   CO2 20 (L) 22 -  32 mmol/L   Glucose, Bld 129 (H) 70 - 99 mg/dL    Comment: Glucose reference range applies only to samples taken after fasting for at least 8 hours.   BUN 15 8 - 23 mg/dL   Creatinine, Ser 1.19 (H) 0.61 - 1.24 mg/dL   Calcium 8.8 (L) 8.9 - 10.3 mg/dL   Total Protein 7.0 6.5 - 8.1 g/dL   Albumin 3.6 3.5 - 5.0 g/dL   AST 25 15 - 41 U/L   ALT 15 0 - 44 U/L   Alkaline Phosphatase 57 38 - 126 U/L   Total Bilirubin 0.9 0.3 - 1.2 mg/dL   GFR, Estimated 53 (L) >60 mL/min    Comment: (NOTE) Calculated using the CKD-EPI Creatinine Equation (2021)    Anion gap 14 5 - 15    Comment: Performed at [facility], [address] [place], [place] [address]  CBC with Differential     Status: None   Collection Time: 02/01/23  5:24 AM  Result Value Ref Range   WBC 7.1 4.0 - 10.5 K/uL   RBC 5.13 4.22 - 5.81 MIL/uL   Hemoglobin 14.8 13.0 - 17.0 g/dL   HCT 95.6 21.3 - 08.6 %   MCV 81.3 80.0 - 100.0 fL   MCH 28.8 26.0 - 34.0 pg   MCHC 35.5 30.0 - 36.0 g/dL   RDW 57.8 46.9 - 62.9 %   Platelets 208 150 - 400 K/uL   nRBC 0.0 0.0 - 0.2 %   Neutrophils Relative % 62 %   Neutro Abs 4.4 1.7 - 7.7 K/uL   Lymphocytes Relative 27 %   Lymphs Abs 1.9 0.7 - 4.0 K/uL   Monocytes Relative 9 %   Monocytes Absolute 0.7 0.1 - 1.0 K/uL   Eosinophils Relative 1 %   Eosinophils Absolute 0.1 0.0 - 0.5 K/uL   Basophils Relative 1 %   Basophils Absolute 0.0 0.0 - 0.1 K/uL   Immature Granulocytes 0 %   Abs Immature Granulocytes 0.02 0.00 - 0.07 K/uL    Comment: Performed at [facility], [address], [place], [place] [address]  Protime-INR     Status: None   Collection Time: 02/01/23  5:24 AM  Result Value Ref Range   Prothrombin Time 14.1 11.4 - 15.2 seconds   INR 1.1 0.8 - 1.2    Comment: (NOTE) INR goal varies based on device and disease states. Performed at [facility], [address] [place], [place] [address]   APTT     Status: None   Collection Time: 02/01/23  5:24 AM  Result  Value Ref Range   aPTT 27 24 - 36 seconds    Comment: Performed at [facility], [address] [place], [place] [address]  Blood Culture (routine x 2)  Status: None (Preliminary result)   Collection Time: 02/01/23  5:24 AM   Specimen: BLOOD  Result Value Ref Range   Specimen Description BLOOD LEFT ANTECUBITAL    Special Requests      BOTTLES DRAWN AEROBIC AND ANAEROBIC Blood Culture adequate volume   Culture      NO GROWTH < 12 HOURS Performed at [facility], [address] [place], [place] [address]    Report Status PENDING   Magnesium     Status: None   Collection Time: 02/01/23  5:24 AM  Result Value Ref Range   Magnesium 1.9 1.7 - 2.4 mg/dL    Comment: Performed at [facility], [address], [place], [place] [address]  Phosphorus     Status: None   Collection Time: 02/01/23  5:24 AM  Result Value Ref Range   Phosphorus 3.4 2.5 - 4.6 mg/dL    Comment: Performed at [facility], [address], [place], [place] [address]  Blood Culture (routine x 2)     Status: None (Preliminary result)   Collection Time: 02/01/23  5:28 AM   Specimen: BLOOD  Result Value Ref Range   Specimen Description BLOOD RIGHT ANTECUBITAL    Special Requests      BOTTLES DRAWN AEROBIC AND ANAEROBIC Blood Culture adequate volume   Culture      NO GROWTH < 12 HOURS Performed at [facility], [name], [place], [place] [address]    Report Status PENDING   Urinalysis, w/ Reflex to Culture (Infection Suspected) -Urine, Clean Catch     Status: Abnormal   Collection Time: 02/01/23  5:35 AM  Result Value Ref Range   Specimen Source URINE, CLEAN CATCH    Color, Urine STRAW (A) YELLOW   APPearance CLEAR CLEAR   Specific Gravity, Urine 1.009 1.005 - 1.030   pH 7.0 5.0 - 8.0   Glucose, UA NEGATIVE NEGATIVE mg/dL   Hgb urine dipstick NEGATIVE NEGATIVE   Bilirubin Urine NEGATIVE NEGATIVE   Ketones, ur NEGATIVE NEGATIVE mg/dL   Protein, ur NEGATIVE NEGATIVE mg/dL    Nitrite NEGATIVE NEGATIVE   Leukocytes,Ua NEGATIVE NEGATIVE   RBC / HPF 0-5 0 - 5 RBC/hpf   WBC, UA 0-5 0 - 5 WBC/hpf    Comment:        Reflex urine culture not performed if WBC <=10, OR if Squamous epithelial cells >5. If Squamous epithelial cells >5 suggest recollection.    Bacteria, UA NONE SEEN NONE SEEN   Squamous Epithelial / HPF 0-5 0 - 5 /HPF    Comment: Performed at [facility], [address] [place], [place] [address]  Sodium, urine, random     Status: None   Collection Time: 02/01/23  5:35 AM  Result Value Ref Range   Sodium, Ur 77 mmol/L    Comment: Performed at [facility], [address] [place], [place] [address]  Osmolality, urine     Status: None   Collection Time: 02/01/23  5:35 AM  Result Value Ref Range   Osmolality, Ur 352 300 - 900 mOsm/kg    Comment: REPEATED TO VERIFY Performed at [facility], [address] [place], [place] [address]   Lactic acid, plasma     Status: None   Collection Time: 02/01/23  7:34 AM  Result Value Ref Range   Lactic Acid, Venous 0.9 0.5 - 1.9 mmol/L    Comment: Performed at [facility], [address], [place], [place] [address]  Renal function panel     Status: Abnormal   Collection Time: 02/01/23  9:05 AM  Result Value Ref Range   Sodium 115 (LL) 135 - 145 mmol/L    Comment: CRITICAL RESULT CALLED TO, READ BACK BY AND VERIFIED WITH A.SMITH RN [phone] 02/01/23 MCCORMICK K   Potassium 5.1 3.5 - 5.1 mmol/L   Chloride 85 (L) 98 - 111 mmol/L   CO2 21 (L) 22 - 32 mmol/L   Glucose, Bld 110 (H) 70 - 99 mg/dL    Comment: Glucose reference range applies only to samples taken after fasting for at least 8 hours.   BUN 13 8 - 23 mg/dL   Creatinine, Ser 4.09 0.61 - 1.24 mg/dL   Calcium 8.3 (L) 8.9 - 10.3 mg/dL   Phosphorus 3.4 2.5 - 4.6 mg/dL   Albumin 3.3 (L) 3.5 - 5.0 g/dL   GFR, Estimated >81 >19 mL/min    Comment: (NOTE) Calculated using the CKD-EPI Creatinine Equation (2021)    Anion gap 9 5 -  15    Comment: Performed at [facility], [address] [place], [place] [address]  Osmolality     Status: Abnormal   Collection Time: 02/01/23  9:05 AM  Result Value Ref Range   Osmolality 258 (L) 275 - 295 mOsm/kg    Comment: Performed at [facility], [address] [place], [place] [address]   DG Chest Port 1 View  Result Date: 02/01/2023 CLINICAL DATA:  82 year old male with possible sepsis.  Weakness. EXAM: PORTABLE CHEST 1 VIEW COMPARISON:  Portable chest 07/06/2022. FINDINGS: Portable AP semi upright view at 0542 hours. Lower lung volumes. Mediastinal contours are stable and within normal limits. Aside from crowding of lung markings Allowing for portable technique the lungs are clear. Visualized tracheal air column is within normal limits. No pneumothorax or pleural effusion. Stable visible bowel gas. Advanced left glenohumeral degeneration. No acute osseous abnormality identified. IMPRESSION: Lower lung volumes.  No acute cardiopulmonary abnormality. Electronically Signed   By: Odessa Fleming M.D.   On: 02/01/2023 05:56    Pending Labs Unresulted Labs (From admission, onward)     Start     Ordered   02/02/23 0930  Cortisol-am, blood  Once,   R        02/01/23 0931   02/02/23 0930  ACTH stimulation, 3 time points  Once,   R       Comments: For inpatient lab collection, please call lab to coordinate timing of collections with phlebotomy PRIOR to administering cosyntropin (CORTROSYN).    02/01/23 0931   02/02/23 0500  Basic metabolic panel  Tomorrow morning,   R        02/01/23 0836   02/02/23 0500  CBC  Tomorrow morning,   R        02/01/23 0836   02/01/23 2100  Valproic acid level  Once,   R        02/01/23 1101   02/01/23 0938  CK  Add-on,   AD        02/01/23 0937   02/01/23 0932  T4, free  Add-on,   AD        02/01/23 0931   02/01/23 0931  TSH  Add-on,   AD        02/01/23 3086   02/01/23 0836  Basic metabolic panel  Now then every 4 hours,   R  02/01/23 0836             Vitals/Pain Today's Vitals   02/01/23 0645 02/01/23 0700 02/01/23 0915 02/01/23 1100  BP:  135/80  129/80  Pulse: 69 68  77  Resp: 15 15  14   Temp:   97.7 F (36.5 C)   TempSrc:   Oral   SpO2: 100% 100%  100%  Weight:      Height:      PainSc:        Isolation Precautions No active isolations  Medications Medications  rivaroxaban (XARELTO) tablet 10 mg (10 mg Oral Given 02/01/23 0952)  acetaminophen (TYLENOL) tablet 650 mg (has no administration in time range)  amLODipine (NORVASC) tablet 10 mg (10 mg Oral Given 02/01/23 0952)  aspirin EC tablet 81 mg (81 mg Oral Given 02/01/23 0952)  divalproex (DEPAKOTE ER) 24 hr tablet 500 mg (has no administration in time range)  docusate sodium (COLACE) capsule 100 mg (100 mg Oral Given 02/01/23 0953)  finasteride (PROSCAR) tablet 5 mg (5 mg Oral Given 02/01/23 0952)  memantine (NAMENDA) tablet 10 mg (10 mg Oral Given 02/01/23 0953)  mirabegron ER (MYRBETRIQ) tablet 25 mg (25 mg Oral Given 02/01/23 1019)  rosuvastatin (CRESTOR) tablet 20 mg (20 mg Oral Given 02/01/23 0952)  senna (SENOKOT) tablet 8.6 mg (8.6 mg Oral Given 02/01/23 0952)  tamsulosin (FLOMAX) capsule 0.4 mg (0.4 mg Oral Given 02/01/23 0952)  latanoprost (XALATAN) 0.005 % ophthalmic solution 1 drop (has no administration in time range)  multivitamin (PROSIGHT) tablet 1 tablet (1 tablet Oral Given 02/01/23 1019)  ondansetron (ZOFRAN) injection 4 mg (4 mg Intravenous Given 02/01/23 1123)  sodium chloride 0.9 % bolus 1,000 mL (0 mLs Intravenous Stopped 02/01/23 0737)    Mobility walks with device     Focused Assessments    R Recommendations: See Admitting Provider Note  Report given to:   Additional Notes:

## 2023-02-01 NOTE — ED Notes (Signed)
Date and time results received: 02/01/23 0619 (use smartphrase ".now" to insert current time)  Test: LA  sodium Critical Value: LA 3.3  Na 117  Name of Provider Notified: Peterson Lombard, MD  Orders Received? Or Actions Taken?:  n/a

## 2023-02-01 NOTE — Hospital Course (Addendum)
Hyponatremia, acute on chronic, severe Presenting Na 117 with 2-3 days of global weakness and tremor. Urine studies showed urine sodium 77, urine osm 352. He was not profoundly hyperglycemic so this was not pseudohyponatremia. Thyroid studies were collected and were normal with TSH 0.868 and T4 0.94. AM cortisol was low at 4.9 prompting ACTH stim test which showed base cortisol 4.4, 30 minutes 40.0, and 60 minutes 35.3. He was placed on a fluid restricted diet, 1.2L.and received 3% hypertonic saline in the ICU with resolution of acute on chronic hyponatremia. With resolution of acute severe hyponatremia, patient endorsed feeling significantly better, did not have polydipsia, and noted that his tremor improved.   Elevated lactic acid Lactic acid 3.3 on admission, corrected to normal s/p 1 L NS. He did not exhibit signs of infection while admitted. Blood culture that was collected on admission shows no growth at time of discharge.   Seizure disorder No witnessed seizure activity however due to urinary incontinence prior to coming to the hospital in the setting of severe hyponatremia, CK was obtained which was normal. Depakote level was checked and was subtherapeutic. No witnessed seizure like activity while admitted. Continued home depakote 500 mg daily at bedtime.   Hx dementia Hx tremor Tremor noted on admission, intention in nature. This improved with improvement of severe hyponatremia. Continued home memantine 10 mg BID.   Hx CVA, 2019 PAD Continued home ASA 81 mg daily.   HTN Continued home amlodipine 10 mg daily.   Hx overactive bladder Hx BPH On admission patient was noted to have about 600 cc urinary retention on in-and-out cath. UA without evidence of infection. Continued home finasteride 5 mg daily, myrbetriq 25 mg daily, tamsulosin 0.4 mg daily.   HLD Continued home rosuvastatin 20 mg daily.   CKD stage 3a Renal function stable throughout admission.    Glaucoma Cataracts   Continued home Travoprost 0.004% ophthalmic solution, 1 drop both eyes daily at bedtime   Bowel regimen Continued scheduled home colace once daily, senna BID.

## 2023-02-01 NOTE — H&P (Addendum)
Date: 02/01/2023               Patient Name:  Eric Barrett. MRN: [id number]  DOB: 04-11-1941 Age / Sex: 82 y.o., male   PCP: Raymon Mutton., FNP         Medical Service: Internal Medicine Teaching Service         Attending Physician: Dr. Mercie Eon, MD    First Contact: Dr. Rana Snare, DO Pager: [phone]  Second Contact: Dr. Champ Mungo, DO Pager: [phone]       After Hours (After 5p/  First Contact Pager: [phone]  weekends / holidays): Second Contact Pager: [phone]   Chief Complaint: Generalized weakness  History of Present Illness: Eric Barrett. Eric Barrett. Is a 82 y.o. M with PMH HFpEF, dementia, CVA in 2019, HTN, HLD, seizure disorder, CKD stage 3a, BPH, who presented from home with generalized weakness, tremor, urinary incontinence admitted for severe hyponatremia.  Information obtained from his wife, Eric Barrett, who is at bedside. She states that he has been experiencing a tremor for several months but noted that around 11A 06/26 it seemed to get much worse. He has been weak as well and last night she had to give him his dinner in bed as his weakness and tremor prevented him from safely getting up to the dining room table.  Over about the last 1 week patient has been very thirsty and drinking a lot of water as well as some tea and coffee. He has been urinating every 45 minutes-1 hour as a result, including through the night. Eric Barrett does have some concern that he isn't fully voiding though. He wears a pull up every day for urinary incontinence but last night had an episode of large-volume incontinence that soaked the entire bed and patient. He does occasionally have urge incontinence and will have urine leakage into the pull-up but has never had a large-volume episode like this.  He has been having regular BM and had a large BM yesterday. Following the large BM he voiced feeling weak and went back to bed. [name] notes he almost fell during this episode of weakness. When he woke up,  he stated that he felt constipated.   He has been eating however his appetite has decreased. He has lost about 3 pounds per his last PCP visit. His wife is unable to safely weigh him at home.   No measured fever but he is "always cold." His las OV at the [place] was notable for apparent mild R foot swelling. He has been less mobile over the last several days while more weak than baseline. He ambulates with a walker.  He has been taking all of his medications. Last witnessed seizure was in 2023.  ED Course: Initial labs remarkable for Na 117, Cl 83, bicarb 20, creatinine 1.35, GFR 53; no leukocytosis; blood cultures collected; UA without evidence of infection; urine Na 17 and osm 352; lactic acid 3.3>0.9 s/p 1 L NS.  Meds:  Tylenol 650 mg q6h PRN pain, fever, headache Amlodipine 10 mg daily ASA 81 mg daily Dulcolax 5 mg daily PRN constipation Cholecalciferol 1000 units daily Depakote 500 mg daily at bedtime Colace 100 mg daily Finasteride 5 mg daily Miralax 17g daily PRN constipation Memantine 10 mg BID MV one capsule daily Myrbetriq 25 mg daily Rosuvastatin 20 mg daily Tamsulosin 0.4 mg daily Travoprost 0.004% ophthalmic solution, 1 drop both eyes daily at bedtime  Allergies: Allergies as of 02/01/2023 - Review Complete  02/01/2023  Allergen Reaction Noted   Beef allergy Other (See Comments) 07/16/2022   Caffeine Other (See Comments) 07/16/2022   Chocolate Other (See Comments) 07/16/2022   PMH: HFpEF Dementia CVA 2019 HTN HLD Seizure disorder CKD stage 3a PAD BPH Glaucoma Cataracts  Tremor  PSH: Tonsillectomy as a child  Family History: Mother and father both had unspecified heart conditions.  Social History: Lives with wife Eric Barrett in a first floor apartment. There is a flight of stairs at the front so they exit via the patio to avoid the stairs. Uses a walker. Dependent in ADLs and IADLs. Has children who live in [place]. Granddaughter has been helping around the house some,  some DME assistance from [place]. PCP is Fatima Sanger at [facility] on [facility]. He is not eligible for Medicaid so does not qualify for a home health aide.  Review of Systems: A complete ROS was negative except as per HPI.   Physical Exam: Blood pressure 135/80, pulse 68, temperature 98.1 F (36.7 C), temperature source Oral, resp. rate 15, height 5\' 7"  (1.702 m), weight 84 kg, SpO2 100 %. Constitutional:Elderly gentleman in no acute distress.  Cardio:Regular rate and rhythm. No murmurs, rubs, or gallops. Pulm:Clear to auscultation bilaterally. Normal work of breathing on room air. Abdomen:Soft, nontender, nondistended. ZOX:WRUEAVWU for extremity edema. Skin:Warm and dry. Neuro: Mental Status: Patient is awake, alert, oriented to self, place, month, year No signs of aphasia or neglect Cranial Nerves: III,IV, VI: EOMI without ptosis or diploplia.  V: Facial sensation is symmetric to light touch and temperature. VII: Facial movement is symmetric.  VIII: Hearing is intact to voice XI: Shoulder shrug is symmetric. XII: Tongue is midline without atrophy or fasciculations.  Motor: Equal and normal effort thorughout, at least 5/5 bilateral UE, 5/5 bilateral LE. R grip strength 4+/5. Sensory: Sensation is grossly intact and qual bilateral UE & LE. Psych:Pleasant mood and affect.  EKG: personally reviewed my interpretation is NSR.  CXR: personally reviewed my interpretation is low lung volumes but otherwise no active cardiopulmonary abnormalities.  Assessment & Plan by Problem: Principal Problem:   Hyponatremia  Hyponatremia, acute on chronic, severe Presenting Na 117 with 2-3 days of global weakness and tremor. Over the last ~1 week patient's wife explains that he has been extremely thirsty with frequent urination. On exam he is clinically euvolemic. Additional urine studies show urine sodium 77, urine osm 352. No history of thyroid or adrenal dysfunction. He is not profoundly  hyperglycemic so I do not suspect that as etiology of polyuria and mild hyperglycemia does not change corrected Na. He is not on a thiazide diuretic, is not overtly volume overloaded. He was admitted for hyponatremia in 05/2022 with similar presenting symptoms and on further review this was suspected as cause of prior hyponatremia possibly due to depakote however ultimately felt to be due to poor solute intake and high free water intake--"tea and toast." Despite this not being the etiology previously, I do have concern for possible SIADH at this time though. Repeat sodium s/p 1L NS is 115. I suspect his weakness is being exacerbated by hyponatremia.  -Nephrology consulted, appreciate their assistance  -Shared concerns that given further decrease of Na level with IVF bolus, he will benefit from 3% hypertonic saline; I will consult PCCM at this time -Will check TSH, T4, AM cortisol, ACTH stim test -Na correction goal 6-8 mEq/24h -Fluid-restricted to diet  Elevated lactic acid, resolved Lactic acid 3.3 on admission, corrected to normal s/p 1 L  NS. He does have a history of osteomyelitis and his wife does express some worry because of his weakness and similar weakness when he was hospitalized for that infection. On exam his strength is objectively fully intact, normo-minimal hypertension, normal HR, no documented fever, no leukocytosis, UA without signs of infection and patient denies urinary discomfort. Blood cultures collected and pending. Some concern per wife that he may have had a seizure vs fever overnight as he not only had urinary incontinence but was drenched when she found the patient incontinent but no measured fever. Would consider possibility of seizure as cause of elevated lactic acid. -Trend CBC, fever curve -Follow blood culture -Check CK -Low threshold for broad-spectrum antibiotics if fevers  Seizure disorder Last seizure was in 2023. Some concern per wife that he may have had a seizure  vs fever overnight as he not only had urinary incontinence but was drenched when she found the patient incontinent. He had not been aware of the incontinent episode and thought it occurred while sleeping. No seizure like activity witnessed, no tongue lacerations, no apparent change in mental status to suspect post-ictal state. On depakote 500 mg daily and he has not missed a dose. Would be worried about severe hyponatremia lowering seizure threshold as well. Last dose of depakote was around 9P 06/26. -Check depakote level at 9P tonight; if low, consider EEG to assess for subclinical seizures; if high, would consult neurology -Continue depakote 500 mg daily for now, timed for after level is drawn this evening -F/u CK  Hx dementia Hx tremor On memantine OP. Tremor is not acute and has been noted since at least October 2023 but seems worse over last several days. His wife does explain that when he ambulates, she often has to remind him to pick his feet up off the ground. He is on depakote for history of seizures which could cause a parkinsonism-type picture though less likely as chart review concerning for prior blood depakote levels below goal however and some mention of seizure concern.  -Continue home memantine 10 mg BID  HFpEF Clinically euvolemic.   Hx CVA, 2019 PAD Continue home ASA 81 mg daily  HTN Mildly hypertensive on admission. OP regimen is amlodipine 10 mg daily. -Continue home amlodipine 10 mg daily  Hx overactive bladder Hx BPH I do not suspect episode of urinary incontinence overnight was related to lumbar spine/neural pathology as he has been experiencing polydipsia and reportedly waking his wife nearly once an hour due to needing to urinate. He was found to have about 600 cc urinary retention on in-and-out cath. He has been taking home medications including finasteride 5 mg daily, myrbetriq 25 mg daily, tamsulosin 0.4 mg daily. UA without evidence of infection as possible  etiology for polyuria. -Continue home finasteride 5 mg daily, myrbetriq 25 mg daily, tamsulosin 0.4 mg daily  HLD Home regimen is rosuvastatin 20 mg daily. -Continue home rosuvastatin 20 mg daily  CKD stage 3a Renal function stable, BUN/Cr 15/1.35 with GFR 53 and apparent baseline creatinine in 1.2-1.4 range. -Trend daily BMP  Glaucoma Cataracts  -Continue home Travoprost 0.004% ophthalmic solution, 1 drop both eyes daily at bedtime  Bowel regimen -Continue scheduled home colace once daily, senna BID -Miralax daily PRN constipation  CODE STATUS: FULL CODE Diet: HH Antibiotics: None Fluids: None Dispo: Admit patient to Inpatient with expected length of stay greater than 2 midnights.  SignedChamp Mungo, DO 02/01/2023, 8:38 AM  After 5pm on weekdays and 1pm on weekends: On Call  pager: [phone]

## 2023-02-01 NOTE — Consult Note (Signed)
Eric Barrett. Admit Date: 02/01/2023 02/01/2023 [facility] Requesting Physician:  Tilden Fossa MD  Reason for Consult:  Hyponatremia HPI:   82 year old male PMH of dementia, osteomyelitis, hypertension, CVA, CHF ambulating.  Also has been having urinary incontinence for the last day.  In-N-Out cath performed in the ED with 600 cc output.  Mild hyponatremia to 117 (his normal range is around mid 120s to 130).  Lactic acid elevated at 3.3 > 0.9 but low concern for sepsis and was given IV hydration with NS bolus x 1. UA wnl. CXR wnl. Uosm 352. Na urine 77. Serum osm pending.  Patient is alert and oriented to person, place, time but situation is difficult for him to explain.  Majority of history from wife and patient also answered his questions as well.  Wife says in the past 3 days he has not been as active and not moving as much.  He walks with a walker at baseline however he has been more weak and staying in place a lot.  He has also had recently decreased appetite over the past few weeks and has lost 3 pounds without trying.  He has been feeling shaky and weak over the past 3 days but worse yesterday.  Around 11 in the morning yesterday he had a large bowel movement and afterwards felt very weak and shaky and almost fell down going to his bed.  After that wife started to use bedside commode for him.  Denies any diarrhea or vomiting.  He urinates small amounts frequently and does not feel like he empties his bladder completely.  Does have prostate issues at baseline and takes medication for this.  [facility].  Denies any HCTZ use however states that she believes he is on losartan 50 mg daily in addition to his amlodipine.  They state that he has been drinking "a lot" of water -or over the past few days.  When quantifying this she states around 4-6 medium size tumblers of water a day.  He states he has been more thirsty than usual.  The reason for coming in last  night was because he woke up at nighttime and was incontinent of urine and she states that at baseline has pull-ups that he wears however this was a large amount of urine which he has never had an issue with before. States that around a year ago he did have low sodium as well.  He does take Depakote nightly as well.  States he has some slight burning with urination this morning.    PMH Incudes: Past Medical History:  Diagnosis Date   BPH (benign prostatic hyperplasia)    Chronic diastolic (congestive) heart failure (HCC)    Dementia (HCC)    Glaucoma    Hemiparesis of left nondominant side (HCC)    History of shingles    HLD (hyperlipidemia)    Hypertension    Stroke (HCC)    Tremor       Creatinine, Ser (mg/dL)  Date Value  03/47/4259 1.35 (H)  12/05/2022 1.42 (H)  07/24/2022 1.30 (H)  07/23/2022 1.29 (H)  07/22/2022 1.38 (H)  07/21/2022 1.35 (H)  07/20/2022 1.28 (H)  07/19/2022 1.40 (H)  07/18/2022 1.14  07/17/2022 1.07   I/Os:  ROS Increased thirst, fatigue, shakiness Negative for AMS Balance of 12 systems is negative w/ exceptions as above  PMH  Past Medical History:  Diagnosis Date   BPH (benign prostatic hyperplasia)  Chronic diastolic (congestive) heart failure (HCC)    Dementia (HCC)    Glaucoma    Hemiparesis of left nondominant side (HCC)    History of shingles    HLD (hyperlipidemia)    Hypertension    Stroke [facility])    Tremor    PSH  Past Surgical History:  Procedure Laterality Date   COLONOSCOPY WITH PROPOFOL N/A 05/24/2022   Procedure: COLONOSCOPY WITH PROPOFOL;  Surgeon: Meryl Dare, MD;  Location: [facility] ENDOSCOPY;  Service: Gastroenterology;  Laterality: N/A;   IR LUMBAR DISC ASPIRATION W/IMG GUIDE  07/10/2022   No past surgery     POLYPECTOMY  05/24/2022   Procedure: POLYPECTOMY;  Surgeon: Meryl Dare, MD;  Location: [facility] ENDOSCOPY;  Service: Gastroenterology;;   FH  Family History  Problem Relation Age of Onset   Heart attack  Mother    Heart attack Father    SH  reports that he quit smoking about 39 years ago. His smoking use included cigars. He has never used smokeless tobacco. He reports that he does not currently use alcohol. He reports that he does not use drugs. Allergies  Allergies  Allergen Reactions   Beef Allergy Other (See Comments)    Per patient's wife, outpatient GI removed a precancerous polyp and advised patient should avoid chocolate.    Caffeine Other (See Comments)    Per patient's wife, outpatient GI removed a precancerous polyp and advised patient should avoid chocolate.    Chocolate Other (See Comments)    Per patient's wife, outpatient GI removed a precancerous polyp and advised patient should avoid chocolate.    Home medications Prior to Admission medications   Medication Sig Start Date End Date Taking? Authorizing Provider  acetaminophen (TYLENOL) 325 MG tablet Take 2 tablets (650 mg total) by mouth every 6 (six) hours as needed for mild pain, fever or headache. 05/24/22   Elgergawy, Leana Roe, MD  amLODipine (NORVASC) 10 MG tablet Take 10 mg by mouth daily. 07/08/21   [provider]  ASPIRIN 81 PO Take 81 mg by mouth daily.    [provider]  bisacodyl (DULCOLAX) 5 MG EC tablet Take 1 tablet (5 mg total) by mouth daily as needed for moderate constipation. 07/24/22   Joseph Art, DO  Cholecalciferol (VITAMIN D3) 25 MCG (1000 UT) CAPS Take 1,000 Units by mouth daily.    [provider]  divalproex (DEPAKOTE ER) 500 MG 24 hr tablet Take 1 tablet (500 mg total) by mouth at bedtime. 01/19/22   Levert Feinstein, MD  docusate sodium (COLACE) 100 MG capsule Take 100 mg by mouth daily.    [provider]  finasteride (PROSCAR) 5 MG tablet Take 5 mg by mouth daily. 08/24/21   [provider]  GAVILAX 17 GM/SCOOP powder Take 17 g by mouth daily as needed for mild constipation. 04/26/22   [provider]  lidocaine (LIDODERM) 5 % Place 1 patch onto  the skin daily. Remove & Discard patch within 12 hours or as directed by MD 07/24/22   Joseph Art, DO  memantine (NAMENDA) 10 MG tablet Take 10 mg by mouth 2 (two) times daily. 02/08/22   [provider]  morphine (MS CONTIN) 15 MG 12 hr tablet Take 1 tablet (15 mg total) by mouth every 12 (twelve) hours. 07/24/22   Joseph Art, DO  multivitamin-lutein (OCUVITE-LUTEIN) CAPS capsule Take 1 capsule by mouth daily.    [provider]  MYRBETRIQ 25 MG TB24 tablet Take  25 mg by mouth daily. 11/02/21   [provider]  oxyCODONE (OXY IR/ROXICODONE) 5 MG immediate release tablet Take 1 tablet (5 mg total) by mouth every 4 (four) hours as needed for moderate pain. 07/24/22   Joseph Art, DO  rosuvastatin (CRESTOR) 20 MG tablet Take 20 mg by mouth daily.    [provider]  senna (SENOKOT) 8.6 MG TABS tablet Take 1 tablet (8.6 mg total) by mouth in the morning and at bedtime. 07/24/22   Joseph Art, DO  tamsulosin (FLOMAX) 0.4 MG CAPS capsule Take 0.4 mg by mouth daily. 10/29/20   [provider]  Travoprost, BAK Free, (TRAVATAN) 0.004 % SOLN ophthalmic solution Place 1 drop into both eyes at bedtime.    [provider]    Current Medications Scheduled Meds: Continuous Infusions: PRN Meds:.  CBC Recent Labs  Lab 02/01/23 0524  WBC 7.1  NEUTROABS 4.4  HGB 14.8  HCT 41.7  MCV 81.3  PLT 208   Basic Metabolic Panel Recent Labs  Lab 02/01/23 0524  NA 117*  K 4.9  CL 83*  CO2 20*  GLUCOSE 129*  BUN 15  CREATININE 1.35*  CALCIUM 8.8*  PHOS 3.4    Physical Exam  Blood pressure 135/80, pulse 68, temperature 98.1 F (36.7 C), temperature source Oral, resp. rate 15, height 5\' 7"  (1.702 m), weight 84 kg, SpO2 100 %. GEN: wdwn, sitting in bed, nad, alert and oriented x 3 (difficult to describe situation) ENT: no nasal discharge, mmm EYES: no scleral icterus, eomi CV: normal rate, no murmurs PULM: no iwob, bilateral chest  rise ABD: NABS, non-distended SKIN: no rashes or jaundice EXT: no edema, warm and well perfused  Assessment 82 year old male admitted for weakness and fatigue found to have acute on chronic hyponatremia.  Over the past few days has been extremely thirsty with increased water intake and frequent urination.  Urine studies so far show sodium of 77 and osmolality of 352.  Wife states that she believes he is also on ARB for his hypertension but denies any thiazide use.  He is on Depakote as well for his history of seizures and was this was thought to be because in the past.  Does have poor intake as well.  Plan Hyponatremia initial 117 s/p 1 L NS > 115. 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 -thyroid studies, cortisol, ACTH ordered per primary -Goal correction 6 to 8 mEq every 24 hours -Monitor Na q4h -fluid restriction -Recommend 3% hypertonic saline initiation  2. Urinary retention s/p 600 I/O cath in ED likely d/t BPH -bladder scans  3. CKD 3a Cr 1.35>1.15 after fluids. -monitor on BMP  4. Lactic Acidosis, resolved s/p fluids  Rest per primary   [facility] FM PGY-2 [phone] pgr 02/01/2023, 8:12 AM

## 2023-02-01 NOTE — ED Notes (Signed)
Sodium is 115

## 2023-02-01 NOTE — ED Provider Notes (Signed)
[place] EMERGENCY DEPARTMENT AT [facility] Provider Note   CSN: [id number] Arrival date & time: 02/01/23  4098     History  Chief Complaint  Patient presents with   Weakness    Patient arrived via ems from home with worsening weakness and urinary incontinence over past 24 hours VS 154/90 94 18 98% CBG 114    Eric Philson. is a 82 y.o. male.  The history is provided by the patient, medical records, the EMS personnel and the spouse.  Weakness Eric Thackston. is a 82 y.o. male who presents to the Emergency Department complaining of weakness.  He Zentz to the emergency department by EMS for evaluation of weakness that started yesterday.  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  He was in his routine state of health until yesterday when he developed global weakness and shakiness as described by wife with difficulty standing and ambulating.  He typically ambulates with a walker.  Last night he had an episode of bladder incontinence where he saturated the bed.  He did report low back pain, that he states was present briefly as well as occasional dysuria.  No reported fevers, chest pain, neck pain, abdominal pain, nausea, vomiting.  Wife reports that he had similar weakness when he had osteomyelitis.     Home Medications Prior to Admission medications   Medication Sig Start Date End Date Taking? Authorizing Provider  acetaminophen (TYLENOL) 325 MG tablet Take 2 tablets (650 mg total) by mouth every 6 (six) hours as needed for mild pain, fever or headache. 05/24/22   Elgergawy, Leana Roe, MD  amLODipine (NORVASC) 10 MG tablet Take 10 mg by mouth daily. 07/08/21   [provider]  ASPIRIN 81 PO Take 81 mg by mouth daily.    [provider]  bisacodyl (DULCOLAX) 5 MG EC tablet Take 1 tablet (5 mg total) by mouth daily as needed for moderate constipation. 07/24/22   Joseph Art, DO  Cholecalciferol (VITAMIN D3) 25 MCG (1000 UT) CAPS Take 1,000 Units by mouth daily.    [provider]  divalproex (DEPAKOTE ER) 500 MG 24 hr tablet Take 1 tablet (500 mg total) by mouth at bedtime. 01/19/22   Levert Feinstein, MD  docusate sodium (COLACE) 100 MG capsule Take 100 mg by mouth daily.    [provider]  finasteride (PROSCAR) 5 MG tablet Take 5 mg by mouth daily. 08/24/21   [provider]  GAVILAX 17 GM/SCOOP powder Take 17 g by mouth daily as needed for mild constipation. 04/26/22   [provider]  lidocaine (LIDODERM) 5 % Place 1 patch onto the skin daily. Remove & Discard patch within 12 hours or as directed by MD 07/24/22   Joseph Art, DO  memantine (NAMENDA) 10 MG tablet Take 10 mg by mouth 2 (two) times daily. 02/08/22   [provider]  morphine (MS CONTIN) 15 MG 12 hr tablet Take 1 tablet (15 mg total) by mouth every 12 (twelve) hours. 07/24/22   Joseph Art, DO  multivitamin-lutein (OCUVITE-LUTEIN) CAPS capsule Take 1 capsule by mouth daily.    [provider]  MYRBETRIQ 25 MG TB24 tablet Take 25 mg by mouth daily. 11/02/21   [provider]  oxyCODONE (OXY IR/ROXICODONE) 5 MG immediate release tablet Take 1 tablet (5 mg total) by mouth every 4 (four) hours as needed for moderate pain.  07/24/22   Joseph Art, DO  rosuvastatin (CRESTOR) 20 MG tablet Take 20 mg by mouth daily.    [provider]  senna (SENOKOT) 8.6 MG TABS tablet Take 1 tablet (8.6 mg total) by mouth in the morning and at bedtime. 07/24/22   Joseph Art, DO  tamsulosin (FLOMAX) 0.4 MG CAPS capsule Take 0.4 mg by mouth daily. 10/29/20   [provider]  Travoprost, BAK Free, (TRAVATAN) 0.004 % SOLN ophthalmic solution Place 1 drop into both eyes at bedtime.    [provider]      Allergies    Beef allergy, Caffeine, and Chocolate    Review of Systems   Review of Systems  Neurological:  Positive  for weakness.  All other systems reviewed and are negative.   Physical Exam Updated Vital Signs BP 135/80   Pulse 68   Temp 98.1 F (36.7 C) (Oral)   Resp 15   Ht 5\' 7"  (1.702 m)   Wt 84 kg   SpO2 100%   BMI 29.00 kg/m  Physical Exam Vitals and nursing note reviewed.  Constitutional:      Appearance: He is well-developed.  HENT:     Head: Normocephalic and atraumatic.  Cardiovascular:     Rate and Rhythm: Normal rate and regular rhythm.     Heart sounds: No murmur heard. Pulmonary:     Effort: Pulmonary effort is normal. No respiratory distress.     Breath sounds: Normal breath sounds.  Abdominal:     Palpations: Abdomen is soft.     Tenderness: There is no abdominal tenderness. There is no guarding or rebound.  Musculoskeletal:        General: No tenderness.     Comments: 2+ DP pulses bilaterally  Skin:    General: Skin is warm and dry.  Neurological:     Mental Status: He is alert and oriented to person, place, and time.     Comments: Oriented to person and place, disoriented to time.  4+ out of 5 strength in bilateral upper extremities.  4+ out of 5 strength in the right lower extremity, 3 out of 5 strength in the left lower extremity.  Normal rectal tone.  Sensation to light touch intact in all 4 extremities.  1+ patellar reflexes bilaterally.  Psychiatric:        Behavior: Behavior normal.     ED Results / Procedures / Treatments   Labs (all labs ordered are listed, but only abnormal results are displayed) Labs Reviewed  LACTIC ACID, PLASMA - Abnormal; Notable for the following components:      Result Value   Lactic Acid, Venous 3.3 (*)    All other components within normal limits  COMPREHENSIVE METABOLIC PANEL - Abnormal; Notable for the following components:   Sodium 117 (*)    Chloride 83 (*)    CO2 20 (*)    Glucose, Bld 129 (*)    Creatinine, Ser 1.35 (*)    Calcium 8.8 (*)    GFR, Estimated 53 (*)    All other components within normal limits   URINALYSIS, W/ REFLEX TO CULTURE (INFECTION SUSPECTED) - Abnormal; Notable for the following components:   Color, Urine STRAW (*)    All other components within normal limits  CULTURE, BLOOD (ROUTINE X 2)  CULTURE, BLOOD (ROUTINE X 2)  CBC WITH DIFFERENTIAL/PLATELET  PROTIME-INR  APTT  MAGNESIUM  PHOSPHORUS  SODIUM, URINE, RANDOM  OSMOLALITY, URINE  LACTIC ACID, PLASMA  RENAL FUNCTION  PANEL  OSMOLALITY    EKG EKG Interpretation  Date/Time:  Thursday February 01 2023 05:17:12 EDT Ventricular Rate:  83 PR Interval:  170 QRS Duration: 97 QT Interval:  370 QTC Calculation: 435 R Axis:   86 Text Interpretation: Sinus rhythm Inferior infarct, age indeterminate Confirmed by Tilden Fossa [phone]) on 02/01/2023 5:45:10 AM  Radiology DG Chest Port 1 View  Result Date: 02/01/2023 CLINICAL DATA:  82 year old male with possible sepsis.  Weakness. EXAM: PORTABLE CHEST 1 VIEW COMPARISON:  Portable chest 07/06/2022. FINDINGS: Portable AP semi upright view at 0542 hours. Lower lung volumes. Mediastinal contours are stable and within normal limits. Aside from crowding of lung markings Allowing for portable technique the lungs are clear. Visualized tracheal air column is within normal limits. No pneumothorax or pleural effusion. Stable visible bowel gas. Advanced left glenohumeral degeneration. No acute osseous abnormality identified. IMPRESSION: Lower lung volumes.  No acute cardiopulmonary abnormality. Electronically Signed   By: Odessa Fleming M.D.   On: 02/01/2023 05:56    Procedures Procedures    Medications Ordered in ED Medications  sodium chloride 0.9 % bolus 1,000 mL (0 mLs Intravenous Stopped 02/01/23 0737)  CRITICAL CARE Performed by: Tilden Fossa   Total critical care time: 45 minutes  Critical care time was exclusive of separately billable procedures and treating other patients.  Critical care was necessary to treat or prevent imminent or life-threatening  deterioration.  Critical care was time spent personally by me on the following activities: development of treatment plan with patient and/or surrogate as well as nursing, discussions with consultants, evaluation of patient's response to treatment, examination of patient, obtaining history from patient or surrogate, ordering and performing treatments and interventions, ordering and review of laboratory studies, ordering and review of radiographic studies, pulse oximetry and re-evaluation of patient's condition.   ED Course/ Medical Decision Making/ A&P                             Medical Decision Making Amount and/or Complexity of Data Reviewed Labs: ordered. Radiology: ordered. ECG/medicine tests: ordered.  Risk Decision regarding hospitalization.   Patient with history of dementia, prior CVA, osteomyelitis of the spine here for evaluation of global weakness, urinary incontinence for 24 hours.  He is globally weak on examination, greater in the lower extremities and upper extremities with normal rectal tone.  He had large-volume incontinence of bladder at home prior to arrival.  Nursing performed In-N-Out cath with return of 600 cc of yellow urine.  Concern for possible recurrent osteomyelitis.  Also concern for possible infection versus electrolyte disturbance.    Labs significant for profound hyponatremia when compared to priors.  He has had a history of same but not as significant as today's.  UA is not consistent with UTI.  Lactic acid is mildly elevated at 3.3, no current evidence of sepsis at this time.  Will treat with IV fluid hydration.  Discussed with Dr. Valentino Nose with nephrology-member the nephrology team will see the patient in consult.  Hyponatremia likely contributing to his weakness, will defer definitive spine imaging at this time.  Medicine consulted for admission.          Final Clinical Impression(s) / ED Diagnoses Final diagnoses:  Weakness  Hyponatremia    Rx /  DC Orders ED Discharge Orders     None         Tilden Fossa, MD 02/01/23 [phone]

## 2023-02-01 NOTE — Consult Note (Signed)
NAME:  Eric Youtz., MRN:  [id number], DOB:  Jul 13, 1941, LOS: 0 ADMISSION DATE:  02/01/2023, CONSULTATION DATE:  6/27 REFERRING MD:  Dr. Lafonda Mosses, CHIEF COMPLAINT:  hyponatremia   History of Present Illness:  Patient is a 82 year old male with pertinent PMH dementia, HTN, CVA, seizure disorder, tremor, CKD stage IIIa, BPH, HFpEF presents to [facility] ED on 6/27 with hyponatremia.  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 Found to have mild-moderate hyponatremia that appeared to be euvolemic. 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  Patient has been having episodes of tremors over the past several months which has gotten worse on 6/26.  Wife also states patient has been having weakness.  Over the past week patient has been very thirsty and drinking a lot of water.  Has been having episodes of urinary incontinence. Has decreased appetite. Denies any seizure activity. Came to [facility] on 6/27 for further work up.  Upon arrival to [facility] ED, patient hemodynamically stable.  Patient AOx3.  NA 117 and OSM 258. Urine na 77 and urine osm. Creat 1.35 which appears to be at his baseline. WBC 7.1 and afebrile.  UA WNL.  LA 3.3 which improved with 1L IV fluids. Repeat na post IV fluids 115. TSH, T4, cortisol, ACTH ordered. PCCM and nephro consulted for possible 3% saline needs.  Pertinent  Medical History   Past Medical History:  Diagnosis Date   BPH (benign prostatic hyperplasia)    Chronic diastolic (congestive) heart failure (HCC)    Dementia (HCC)    Glaucoma    Hemiparesis of left nondominant side (HCC)    History of shingles    HLD (hyperlipidemia)    Hypertension    Stroke [facility])    Tremor      Significant Hospital Events: Including procedures, antibiotic start and stop dates in addition to other pertinent events   6/27 hyponatremia  Interim History / Subjective:  See above  Objective   Blood pressure 135/80, pulse 68, temperature 97.7 F (36.5 C), temperature source Oral, resp.  rate 15, height 5\' 7"  (1.702 m), weight 84 kg, SpO2 100 %.        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 Urine na 77 and urine osm. Plan: -nephro following; appreciate recs -fluid restriction -if na continue to fall despite fluid restriction may need to consider starting hypertonic saline -prn zofran for nausea -frequent na checks -f/u tsh, cortisol, acth per primary  Lactic acidosis Seizure disorder Hx of dementia Hx of tremor HFpEF Hx of CVA PAD HTN HLD CKD 3a Hx of BPH Hx of overactive bladder Plan: -per primary  Best Practice (right click and "Reselect all SmartList Selections" daily)   Per primary  Labs   CBC: Recent Labs  Lab 02/01/23 0524  WBC 7.1  NEUTROABS 4.4  HGB 14.8  HCT 41.7  MCV 81.3  PLT 208  Basic Metabolic Panel: Recent Labs  Lab 02/01/23 0524 02/01/23 0905  NA 117* 115*  K 4.9 5.1  CL 83* 85*  CO2 20* 21*  GLUCOSE 129* 110*  BUN 15 13  CREATININE 1.35* 1.15  CALCIUM 8.8* 8.3*  MG 1.9  --   PHOS 3.4 3.4   GFR: Estimated Creatinine Clearance: 52.2 mL/min (by C-G formula based on SCr of 1.15 mg/dL). Recent Labs  Lab 02/01/23 0524 02/01/23 0734  WBC 7.1  --   LATICACIDVEN 3.3* 0.9    Liver Function Tests: Recent Labs  Lab 02/01/23 0524 02/01/23 0905  AST 25  --   ALT 15  --   ALKPHOS 57  --   BILITOT 0.9  --   PROT 7.0  --   ALBUMIN 3.6 3.3*   No  results for input(s): "LIPASE", "AMYLASE" in the last 168 hours. No results for input(s): "AMMONIA" in the last 168 hours.  ABG    Component Value Date/Time   TCO2 26 08/25/2021 2346     Coagulation Profile: Recent Labs  Lab 02/01/23 0524  INR 1.1    Cardiac Enzymes: No results for input(s): "CKTOTAL", "CKMB", "CKMBINDEX", "TROPONINI" in the last 168 hours.  HbA1C: No results found for: "HGBA1C"  CBG: No results for input(s): "GLUCAP" in the last 168 hours.  Review of Systems:   Review of Systems  Constitutional:  Negative for fever.  Respiratory:  Negative for shortness of breath.   Cardiovascular:  Negative for chest pain.  Gastrointestinal:  Positive for nausea. Negative for abdominal pain, diarrhea and vomiting.  Genitourinary:  Positive for frequency and urgency.  Neurological:  Positive for tremors and weakness. Negative for dizziness, seizures and headaches.     Past Medical History:  He,  has a past medical history of BPH (benign prostatic hyperplasia), Chronic diastolic (congestive) heart failure (HCC), Dementia (HCC), Glaucoma, Hemiparesis of left nondominant side (HCC), History of shingles, HLD (hyperlipidemia), Hypertension, Stroke (HCC), and Tremor.   Surgical History:   Past Surgical History:  Procedure Laterality Date   COLONOSCOPY WITH PROPOFOL N/A 05/24/2022   Procedure: COLONOSCOPY WITH PROPOFOL;  Surgeon: Meryl Dare, MD;  Location: [facility] ENDOSCOPY;  Service: Gastroenterology;  Laterality: N/A;   IR LUMBAR DISC ASPIRATION W/IMG GUIDE  07/10/2022   No past surgery     POLYPECTOMY  05/24/2022   Procedure: POLYPECTOMY;  Surgeon: Meryl Dare, MD;  Location: [facility] ENDOSCOPY;  Service: Gastroenterology;;     Social History:   reports that he quit smoking about 39 years ago. His smoking use included cigars. He has never used smokeless tobacco. He reports that he does not currently use alcohol. He reports that he does not use drugs.   Family  History:  His family history includes Heart attack in his father and mother.   Allergies Allergies  Allergen Reactions   Beef Allergy Other (See Comments)    Per patient's wife, outpatient GI removed a precancerous polyp and advised patient should avoid chocolate.    Caffeine Other (See Comments)    Per patient's wife, outpatient GI removed a precancerous polyp and advised patient should avoid chocolate.    Chocolate Other (See Comments)    Per patient's wife, outpatient GI removed a precancerous polyp and advised patient should avoid chocolate.      Home Medications  Prior to Admission medications   Medication Sig Start Date End Date Taking? Authorizing Provider  acetaminophen (TYLENOL) 325 MG tablet Take 2 tablets (650 mg total) by mouth every  6 (six) hours as needed for mild pain, fever or headache. 05/24/22   Elgergawy, Leana Roe, MD  amLODipine (NORVASC) 10 MG tablet Take 10 mg by mouth daily. 07/08/21   [provider]  ASPIRIN 81 PO Take 81 mg by mouth daily.    [provider]  bisacodyl (DULCOLAX) 5 MG EC tablet Take 1 tablet (5 mg total) by mouth daily as needed for moderate constipation. 07/24/22   Joseph Art, DO  Cholecalciferol (VITAMIN D3) 25 MCG (1000 UT) CAPS Take 1,000 Units by mouth daily.    [provider]  divalproex (DEPAKOTE ER) 500 MG 24 hr tablet Take 1 tablet (500 mg total) by mouth at bedtime. 01/19/22   Levert Feinstein, MD  docusate sodium (COLACE) 100 MG capsule Take 100 mg by mouth daily.    [provider]  finasteride (PROSCAR) 5 MG tablet Take 5 mg by mouth daily. 08/24/21   [provider]  GAVILAX 17 GM/SCOOP powder Take 17 g by mouth daily as needed for mild constipation. 04/26/22   [provider]  lidocaine (LIDODERM) 5 % Place 1 patch onto the skin daily. Remove & Discard patch within 12 hours or as directed by MD 07/24/22   Joseph Art, DO  memantine (NAMENDA) 10 MG tablet Take 10 mg by mouth 2  (two) times daily. 02/08/22   [provider]  multivitamin-lutein (OCUVITE-LUTEIN) CAPS capsule Take 1 capsule by mouth daily.    [provider]  MYRBETRIQ 25 MG TB24 tablet Take 25 mg by mouth daily. 11/02/21   [provider]  rosuvastatin (CRESTOR) 20 MG tablet Take 20 mg by mouth daily.    [provider]  senna (SENOKOT) 8.6 MG TABS tablet Take 1 tablet (8.6 mg total) by mouth in the morning and at bedtime. 07/24/22   Joseph Art, DO  tamsulosin (FLOMAX) 0.4 MG CAPS capsule Take 0.4 mg by mouth daily. 10/29/20   [provider]  Travoprost, BAK Free, (TRAVATAN) 0.004 % SOLN ophthalmic solution Place 1 drop into both eyes at bedtime.    [provider]         JD Anselm Lis [facility] 02/01/2023, 10:38 AM  Please see [email or web address] for pager details.  From 7A-7P if no response, please call [phone]. After hours, please call ELink [phone].

## 2023-02-02 DIAGNOSIS — E871 Hypo-osmolality and hyponatremia: Secondary | ICD-10-CM | POA: Diagnosis not present

## 2023-02-02 LAB — CORTISOL-AM, BLOOD: Cortisol - AM: 4.9 ug/dL — ABNORMAL LOW (ref 6.7–22.6)

## 2023-02-02 LAB — CBC
HCT: 38.5 % — ABNORMAL LOW (ref 39.0–52.0)
Hemoglobin: 13.7 g/dL (ref 13.0–17.0)
MCH: 28.8 pg (ref 26.0–34.0)
MCHC: 35.6 g/dL (ref 30.0–36.0)
MCV: 80.9 fL (ref 80.0–100.0)
Platelets: 216 10*3/uL (ref 150–400)
RBC: 4.76 MIL/uL (ref 4.22–5.81)
RDW: 13.6 % (ref 11.5–15.5)
WBC: 5.4 10*3/uL (ref 4.0–10.5)
nRBC: 0 % (ref 0.0–0.2)

## 2023-02-02 LAB — CULTURE, BLOOD (ROUTINE X 2): Culture: NO GROWTH

## 2023-02-02 LAB — SODIUM
Sodium: 125 mmol/L — ABNORMAL LOW (ref 135–145)
Sodium: 126 mmol/L — ABNORMAL LOW (ref 135–145)
Sodium: 128 mmol/L — ABNORMAL LOW (ref 135–145)
Sodium: 129 mmol/L — ABNORMAL LOW (ref 135–145)

## 2023-02-02 LAB — BASIC METABOLIC PANEL
Anion gap: 9 (ref 5–15)
BUN: 14 mg/dL (ref 8–23)
CO2: 27 mmol/L (ref 22–32)
Calcium: 9 mg/dL (ref 8.9–10.3)
Chloride: 92 mmol/L — ABNORMAL LOW (ref 98–111)
Creatinine, Ser: 1.28 mg/dL — ABNORMAL HIGH (ref 0.61–1.24)
GFR, Estimated: 56 mL/min — ABNORMAL LOW (ref >60)
Glucose, Bld: 95 mg/dL (ref 70–99)
Potassium: 4.5 mmol/L (ref 3.5–5.1)
Sodium: 128 mmol/L — ABNORMAL LOW (ref 135–145)

## 2023-02-02 LAB — GLUCOSE, CAPILLARY
Glucose-Capillary: 92 mg/dL (ref 70–99)
Glucose-Capillary: 88 mg/dL (ref 70–99)

## 2023-02-02 MED ORDER — DEXTROSE 5 % IV SOLN: INTRAVENOUS | Status: DC

## 2023-02-02 NOTE — Progress Notes (Signed)
Attempted to call report now. Was asked to call back in 10 minutes.

## 2023-02-02 NOTE — Progress Notes (Signed)
HD#2 SUBJECTIVE:  Patient Summary: Eric Barrett. Eric Barrett. Is a 82 y.o. M with PMH HFpEF, dementia, CVA in 2019, HTN, HLD, seizure disorder, CKD stage 3a, BPH, who presented from home with generalized weakness, tremor, urinary incontinence admitted for severe hyponatremia requiring brief ICU admission for 3% hypertonic saline now transferred to medical floor.  Overnight Events: NAEON.  Interim History: Patient states he feels much better now than when he first arrived to [facility] ED. He says his tremor is improved, his energy is better, not so dizzy. No acute complaints.  OBJECTIVE:  Vital Signs: Vitals:   02/02/23 1428 02/02/23 1602 02/02/23 2208 02/03/23 0454  BP: 132/75 123/77 132/78 (!) 141/89  Pulse: 83 83 75 72  Resp: 17 16 18    Temp: 97.8 F (36.6 C) 97.8 F (36.6 C) 98.3 F (36.8 C) 98.1 F (36.7 C)  TempSrc: Oral Oral Oral   SpO2: 99% 98% 98% 100%  Weight:      Height:       Supplemental O2: Room Air SpO2: 100 %  Filed Weights   02/01/23 0514  Weight: 84 kg     Intake/Output Summary (Last 24 hours) at 02/03/2023 1005 Last data filed at 02/02/2023 1439 Gross per 24 hour  Intake 288.56 ml  Output 300 ml  Net -11.44 ml   Net IO Since Admission: -3,143.27 mL [02/03/23 1005]  Physical Exam: Constitutional:Elderly gentleman in no acute distress.  Cardio:Regular rate and rhythm. No murmurs, rubs, or gallops. Pulm:Clear to auscultation bilaterally. Normal work of breathing on room air. Abdomen:Soft, nontender, nondistended. RUE:[id number] for extremity edema. Skin:Warm and dry. Neuro: Awake, alert, oriented to self, place, time. Bilateral UE and LE strength 5/5, with R grip strength 4+/5. Psych:Pleasant mood and affect.   Patient Lines/Drains/Airways Status     Active Line/Drains/Airways     Name Placement date Placement time Site Days   Peripheral IV 02/01/23 18 G Left Forearm 02/01/23  0534  Forearm  1   Peripheral IV 02/01/23 18 G Left Antecubital 02/01/23   0534  Antecubital  1   External Urinary Catheter 02/01/23  1504  --  1             ASSESSMENT/PLAN:  Assessment: Principal Problem:   Hyponatremia Active Problems:   Weakness  Plan: Hyponatremia, acute on chronic, severe Patient required brief ICU stay for 3% hypertonic saline. Na this morning now 129, stable over the last day. On admission AM cortisol ordered which was low at 4.9 but in indeterminate range. Thyroid studies were normal. ACTH stim test this morning pending. 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  -Nephrology has signed off, appreciate their assistance -F/u ACTH stim test -Continue fluid-restriction   Seizure disorder No witnessed seizure activity this admission. CK WNL on admission. Valproate acid level low at 27.  -Continue depakote 500 mg daily at bedtime -Will need close OP neurology follow up to discuss dose adjustment   Hx dementia Hx tremor On memantine OP. Tremor not noted on my exam today. -Continue home memantine 10 mg BID   HFpEF Clinically euvolemic.    Hx CVA, 2019 PAD -Continue home ASA 81 mg daily.   HTN Mildly hypertensive to 141/89. OP regimen is amlodipine 10 mg daily. -Continue home amlodipine 10 mg daily   Hx overactive bladder Hx BPH Has had good UOP this admission. OP regimen is finasteride 5 mg daily, myrbetriq 25 mg daily, tamsulosin 0.4 mg daily.  -Continue home finasteride 5 mg  daily, myrbetriq 25 mg daily, tamsulosin 0.4 mg daily   HLD Home regimen is rosuvastatin 20 mg daily. -Continue home rosuvastatin 20 mg daily   CKD stage 3a Renal function stable, BUN/Cr 16/1.24 with GFR 58. Apparent baseline creatinine in 1.2-1.4 range. -Trend daily BMP   Glaucoma Cataracts  -Continue home Travoprost 0.004% ophthalmic solution, 1 drop both eyes daily at bedtime   Bowel regimen -Continue scheduled home colace once daily, senna BID -Miralax daily PRN constipation  Dispo  planning Will discuss recommendation for SNF with wife, Dewayne Hatch. I believe he is medically stable for discharge at this time pending remaining lab results and patient's family preference for discharge location.  Best Practice: Diet: HH w/ fluid restriction 1.2L IVF: None VTE: rivaroxaban (XARELTO) tablet 10 mg Start: 02/01/23 1000 Code: Full AB: None Therapy Recs: SNF Family Contact: [name], wife, called and notified. DISPO: Anticipated discharge pending  SNF placement .  Signature: Champ Mungo, D.O.  Internal Medicine Resident, PGY-2 [facility]  Pager: [phone]   Please contact the on call pager after 5 pm and on weekends at [phone].

## 2023-02-02 NOTE — Plan of Care (Signed)
AM labs reviewed. Na up to 128 around 730am. Asymptomatic & euvolemic, will start d5w 40cc/hr x 4 hrs, recheck Na in 4 hrs. Being transferred out of ICU. Will cont to follow. Call with any q's/concerns.  Anthony Sar, MD [facility]

## 2023-02-02 NOTE — Progress Notes (Signed)
Occupational Therapy Evaluation Patient Details Name: Eric Barrett. MRN: [id number] DOB: 12-Apr-1941 Today's Date: 02/02/2023   History of Present Illness Eric Barrett. is an 82 y.o. male presenting 6/27 with generalized weakness, tremor, urinary incontinence, and severe hyponatremia. Pt also endorses x2 recent falls at home. PMH significant of chronic diastolic CHF; glaucoma; dementia; CVA with L hemiparesis; HTN; and HLD.   Clinical Impression   PTA, pt lived with wife who assisted with ADL as needed due to pt's baseline cognitive status. Upon eval, pt oriented reporting use of calendar this morning. Pt presenting with decreased safety, strength, awareness, and cognition. Pt performing ADL with up to min A due to need for cueing. Will continue to follow. Per PT note, pt would like to go to short term rehabilitation; pending progress and wife ability to provide supervision and intermittent assist, may be able to go home.       Recommendations for follow up therapy are one component of a multi-disciplinary discharge planning process, led by the attending physician.  Recommendations may be updated based on patient status, additional functional criteria and insurance authorization.   Assistance Recommended at Discharge Frequent or constant Supervision/Assistance  Patient can return home with the following A little help with walking and/or transfers;A little help with bathing/dressing/bathroom;Assistance with cooking/housework;Assist for transportation;Help with stairs or ramp for entrance;Direct supervision/assist for medications management;Direct supervision/assist for financial management    Functional Status Assessment  Patient has had a recent decline in their functional status and demonstrates the ability to make significant improvements in function in a reasonable and predictable amount of time.  Equipment Recommendations  None recommended by OT    Recommendations for Other  Services       Precautions / Restrictions Precautions Precautions: Fall Restrictions Weight Bearing Restrictions: No      Mobility Bed Mobility               General bed mobility comments: in recliner on arrival and departure    Transfers Overall transfer level: Needs assistance Equipment used: Rolling walker (2 wheels) Transfers: Sit to/from Stand Sit to Stand: Min guard           General transfer comment: Min guard A for rise and steady. Good hand placement      Balance                                           ADL either performed or assessed with clinical judgement   ADL Overall ADL's : Needs assistance/impaired Eating/Feeding: Set up;Sitting   Grooming: Wash/dry hands;Min guard;Standing   Upper Body Bathing: Set up;Sitting;Cueing for sequencing   Lower Body Bathing: Minimal assistance;Sit to/from stand   Upper Body Dressing : Set up;Sitting   Lower Body Dressing: Minimal assistance;Sit to/from stand   Toilet Transfer: Min guard;Ambulation;Regular Toilet;Rolling walker (2 wheels)   Toileting- Clothing Manipulation and Hygiene: Supervision/safety;Set up;Sitting/lateral lean       Functional mobility during ADLs: Min guard;Rolling walker (2 wheels)       Vision Patient Visual Report: No change from baseline       Perception     Praxis      Pertinent Vitals/Pain Pain Assessment Pain Assessment: No/denies pain     Hand Dominance Right   Extremity/Trunk Assessment Upper Extremity Assessment Upper Extremity Assessment: Generalized weakness (min intention tremor)   Lower Extremity Assessment Lower Extremity Assessment:  Generalized weakness   Cervical / Trunk Assessment Cervical / Trunk Assessment: Normal   Communication Communication Communication: No difficulties   Cognition Arousal/Alertness: Awake/alert Behavior During Therapy: WFL for tasks assessed/performed, Anxious Overall Cognitive Status: History of  cognitive impairments - at baseline                                 General Comments: history of dementia, inaccurate history giving. Pt pleasant, anxiety noted with intention tremors     General Comments       Exercises     Shoulder Instructions      Home Living Family/patient expects to be discharged to:: Private residence Living Arrangements: Spouse/significant other Available Help at Discharge: Family;Available 24 hours/day Type of Home: Apartment Home Access: Level entry     Home Layout: One level     Bathroom Shower/Tub: Chief Strategy Officer: Standard     Home Equipment: Agricultural consultant (2 wheels);BSC/3in1;Tub bench;Wheelchair - manual;Grab bars - tub/shower;Hand held shower head          Prior Functioning/Environment Prior Level of Function : Needs assist;Patient poor historian/Family not available;History of Falls (last six months)             Mobility Comments: pt with confusing history giving, initially stating he was walking with rollator PTA but then states "I really went downhill" and could not explain to this PT what his actual functional level before this admission was ADLs Comments: Per report from sister who called pt during session, pt required assist with ADL. Per pt he sponge bathes at baseline        OT Problem List:        OT Treatment/Interventions: Self-care/ADL training;Therapeutic exercise;DME and/or AE instruction;Balance training;Patient/family education;Therapeutic activities    OT Goals(Current goals can be found in the care plan section) Acute Rehab OT Goals Patient Stated Goal: get stronger OT Goal Formulation: With patient Time For Goal Achievement: 02/16/23 Potential to Achieve Goals: Good  OT Frequency: Min 1X/week    Co-evaluation              AM-PAC OT "6 Clicks" Daily Activity     Outcome Measure Help from another person eating meals?: A Little Help from another person taking care of  personal grooming?: A Little Help from another person toileting, which includes using toliet, bedpan, or urinal?: A Little Help from another person bathing (including washing, rinsing, drying)?: A Little Help from another person to put on and taking off regular upper body clothing?: A Little Help from another person to put on and taking off regular lower body clothing?: A Little 6 Click Score: 18   End of Session Equipment Utilized During Treatment: Gait belt;Rolling walker (2 wheels) Nurse Communication: Mobility status  Activity Tolerance: Patient tolerated treatment well Patient left: in chair;with call bell/phone within reach;with chair alarm set  OT Visit Diagnosis: Unsteadiness on feet (R26.81);Muscle weakness (generalized) (M62.81)                Time: 1610-9604 OT Time Calculation (min): 18 min Charges:  OT General Charges $OT Visit: 1 Visit OT Evaluation $OT Eval Moderate Complexity: 1 Mod  Tyler Deis, OTR/L [facility] Acute Rehabilitation Office: [phone]   Myrla Halsted 02/02/2023, 1:49 PM

## 2023-02-02 NOTE — Progress Notes (Signed)
eLink Physician-Brief Progress Note Patient Name: Eric Barrett. DOB: [birth year] MRN: [id number]   Date of Service  02/02/2023  HPI/Events of Note  Sodium 2251 -126  eICU Interventions  Stop 3% saline Follow sodium level     Intervention Category Intermediate Interventions: Electrolyte abnormality - evaluation and management  Henry Russel, P 02/02/2023, 12:20 AM

## 2023-02-02 NOTE — Progress Notes (Signed)
Admit: 02/01/2023 LOS: 44   82 year old male PMH of dementia, osteomyelitis, hypertension, CVA, CHF ambulating.  Also has been having urinary incontinence for the last day.  In-N-Out cath performed in the ED with 600 cc output.  Mild hyponatremia to 117 (his normal range is around mid 120s to 130).  Lactic acid elevated at 3.3 > 0.9 but low concern for sepsis and was given IV hydration with NS bolus x 1. UA wnl. CXR wnl. Uosm 352. Na urine 77. Serum osm 258. 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 TSH, T4 wnl. Na 117>dropped to 115 after isotonic saline> was placed on 3% saline drip at 40 mL/hr from 1339 to midnight>128 .   Subjective:  Feeling a lot better. UOP 3.6 L. States he is less tired, not shaky any more.    06/27 0701 - 06/28 0700 In: 1388.2 [I.V.:388.2; IV Piggyback:1000] Out: 3600 [Urine:3600]  Filed Weights   02/01/23 0514  Weight: 84 kg    Scheduled Meds:  amLODipine  10 mg Oral Daily   aspirin EC  81 mg Oral Daily   Chlorhexidine Gluconate Cloth  6 each Topical Daily   divalproex  500 mg Oral QHS   docusate sodium  100 mg Oral Daily   finasteride  5 mg Oral Daily   latanoprost  1 drop Both Eyes QHS   memantine  10 mg Oral BID   mirabegron ER  25 mg Oral Daily   multivitamin  1 tablet Oral Daily   rivaroxaban  10 mg Oral Daily   rosuvastatin  20 mg Oral Daily   senna  1 tablet Oral BID   tamsulosin  0.4 mg Oral Daily   Continuous Infusions: PRN Meds:.acetaminophen, ondansetron (ZOFRAN) IV  Current Labs: reviewed    Physical Exam:  Blood pressure 118/68, pulse 68, temperature 97.9 F (36.6 C), temperature source Oral, resp. rate 15, height 5\' 7"  (1.702 m), weight 84 kg, SpO2 98 %. GEN: wdwn, sitting in bed, nad, oriented to person, place, situation, not time ENT: no nasal discharge, mmm EYES: no scleral icterus, eomi CV: normal rate, no murmurs PULM: no iwob, bilateral chest rise ABD: NABS, non-distended SKIN: no rashes or jaundice EXT: no edema,  warm and well perfused  Assessment  82 year old male admitted for weakness and fatigue found to have acute on chronic hyponatremia.  Over the past few days has been extremely thirsty with increased water intake and frequent urination.  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  Plan Hyponatremia initial 117 s/p 1 L NS > 115 > 3% saline > 128 now. Overcorrecting, will start D5W. 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 Thyroid function wnl. AM cortisol pending. -Goal correction 6 to 8 mEq every 24 hours -Monitor Na q4h -fluid restriction -D5 @ 40 started  -may need to discuss with neurology about depakote    2. Urinary retention s/p 600 I/O cath in ED likely d/t BPH -on home BPH meds -monitor   3. CKD 3a Cr 1.35>1.15 after fluids.Unclear baseline. -monitor on BMP   4. Lactic Acidosis, resolved s/p fluids  5. Dementia  Levin Erp, MD [facility] PGY-2 02/02/2023, 8:24 AM  Recent Labs  Lab 02/01/23 0524 02/01/23 0905 02/01/23 1340 02/01/23 1652 02/01/23 1817 02/01/23 2251 02/02/23 0308  NA 117* 115* 118* 118* 119* 126* 125*  K 4.9 5.1 5.5* 4.9 4.8  --   --   CL 83*  85* 84* 86* 87*  --   --   CO2 20* 21* 19* 24 25  --   --   GLUCOSE 129* 110* 159* 131* 150*  --   --   BUN 15 13 12 14 14   --   --   CREATININE 1.35* 1.15 1.31* 1.33* 1.36*  --   --   CALCIUM 8.8* 8.3* 8.6* 8.6* 8.8*  --   --   PHOS 3.4 3.4  --   --   --   --   --    Recent Labs  Lab 02/01/23 0524 02/02/23 0729  WBC 7.1 5.4  NEUTROABS 4.4  --   HGB 14.8 13.7  HCT 41.7 38.5*  MCV 81.3 80.9  PLT 208 216

## 2023-02-02 NOTE — Consult Note (Addendum)
   NAME:  Eric Barrett., MRN:  [id number], DOB:  [birth year], LOS: 1 ADMISSION DATE:  02/01/2023, CONSULTATION DATE:  6/27 REFERRING MD:  Dr. Lafonda Mosses, CHIEF COMPLAINT:  hyponatremia   History of Present Illness:  Patient is a 82 year old male with pertinent PMH dementia, HTN, CVA, seizure disorder, tremor, CKD stage IIIa, BPH, HFpEF presents to [facility] ED on 6/27 with hyponatremia.  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 Found to have mild-moderate hyponatremia that appeared to be euvolemic. 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  Patient has been having episodes of tremors over the past several months which has gotten worse on 6/26.  Wife also states patient has been having weakness.  Over the past week patient has been very thirsty and drinking a lot of water.  Has been having episodes of urinary incontinence. Has decreased appetite. Denies any seizure activity. Came to [facility] on 6/27 for further work up.  Upon arrival to [facility] ED, patient hemodynamically stable.  Patient AOx3.  NA 117 and OSM 258. Urine na 77 and urine osm. Creat 1.35 which appears to be at his baseline. WBC 7.1 and afebrile.  UA WNL.  LA 3.3 which improved with 1L IV fluids. Repeat na post IV fluids 115. TSH, T4, cortisol, ACTH ordered. PCCM and nephro consulted for possible 3% saline needs.  Pertinent  Medical History   Past Medical History:  Diagnosis Date   BPH (benign prostatic hyperplasia)    Chronic diastolic (congestive) heart failure (HCC)    Dementia (HCC)    Glaucoma    Hemiparesis of left nondominant side (HCC)    History of shingles    HLD (hyperlipidemia)    Hypertension    Stroke [facility])    Tremor      Significant Hospital Events: Including procedures, antibiotic start and stop dates in addition to other pertinent events   6/27 hyponatremia  Interim History / Subjective:  See above  Objective   Blood pressure 118/68, pulse 68, temperature 98.4 F (36.9 C), temperature source Oral, resp.  rate 15, height 5\' 7"  (1.702 m), weight 84 kg, SpO2 98 %.        Intake/Output Summary (Last 24 hours) at 02/02/2023 0701 Last data filed at 02/02/2023 4098 Gross per 24 hour  Intake 1388.17 ml  Output 3600 ml  Net -2211.83 ml    Filed Weights   02/01/23 0514  Weight: 84 kg    Examination: No distress L eye chemosis noted, this is stable per him Neuro exam nonfocal Faint SEM, ext warm Abd soft Lungs clear Seems oriented  Sodium- 117>>125 over past 20ish hours  Resolved Hospital Problem list     Assessment & Plan:  Acute on chronic hyponatremia- improved, off hypertonic; urine c/w SIADH - Continue care per nephrology - fluid restriction - IMTS to take over starting tomorrow - AM cortisol check; if low can consider stim but this is tough to coordinate inpatient  Lactic acidosis Seizure disorder Hx of dementia Hx of tremor HFpEF Hx of CVA PAD HTN HLD CKD 3a Hx of BPH Hx of overactive bladder  - PT/OT, continue home meds as ordered    IMTS to take over starting 02/03/23, discussed with Dr. Kimberlee Nearing MD PCCM

## 2023-02-02 NOTE — Evaluation (Signed)
Physical Therapy Evaluation Patient Details Name: Eric Barrett. MRN: [id number] DOB: 05/18/1941 Today's Date: 02/02/2023  History of Present Illness  Eric Barrett. is an 82 y.o. male presenting 6/27 with generalized weakness, tremor, urinary incontinence, and severe hyponatremia. Pt also endorses x2 recent falls at home. PMH significant of chronic diastolic CHF; glaucoma; dementia; CVA with L hemiparesis; HTN; and HLD.  Clinical Impression   Pt presents with generalized weakness, anxiety with mobility especially given history of falls, impaired balance, and decreased activity tolerance. Pt to benefit from acute PT to address deficits. Pt ambulated short hallway distance with use of RW, requires light steadying assist but otherwise pt mobilizing well. Some tremors appreciated especially initially, suspect due to anxiety and weakness. Pt expresses he feels he needs rehabilitation prior to d/c home.  PT to progress mobility as tolerated, and will continue to follow acutely.         Recommendations for follow up therapy are one component of a multi-disciplinary discharge planning process, led by the attending physician.  Recommendations may be updated based on patient status, additional functional criteria and insurance authorization.  Follow Up Recommendations Can patient physically be transported by private vehicle: Yes     Assistance Recommended at Discharge Intermittent Supervision/Assistance  Patient can return home with the following  A little help with walking and/or transfers;A little help with bathing/dressing/bathroom    Equipment Recommendations None recommended by PT  Recommendations for Other Services       Functional Status Assessment Patient has had a recent decline in their functional status and demonstrates the ability to make significant improvements in function in a reasonable and predictable amount of time.     Precautions / Restrictions  Precautions Precautions: Fall Restrictions Weight Bearing Restrictions: No      Mobility  Bed Mobility Overal bed mobility: Needs Assistance Bed Mobility: Supine to Sit     Supine to sit: Supervision, HOB elevated     General bed mobility comments: for safety, use of bedrail and increased time    Transfers Overall transfer level: Needs assistance Equipment used: Rolling walker (2 wheels) Transfers: Sit to/from Stand Sit to Stand: Min assist, From elevated surface           General transfer comment: light rise and steady assist, pt with LE tremors initially but improved with continued standing    Ambulation/Gait Ambulation/Gait assistance: Min guard Gait Distance (Feet): 80 Feet Assistive device: Rolling walker (2 wheels) Gait Pattern/deviations: Step-through pattern, Decreased stride length, Trunk flexed Gait velocity: decr     General Gait Details: for safety, cues for placement in RW and upright posture  Stairs            Wheelchair Mobility    Modified Rankin (Stroke Patients Only)       Balance Overall balance assessment: Needs assistance, History of Falls Sitting-balance support: No upper extremity supported, Feet supported Sitting balance-Leahy Scale: Fair     Standing balance support: Bilateral upper extremity supported, During functional activity Standing balance-Leahy Scale: Poor Standing balance comment: reliant on external assist                             Pertinent Vitals/Pain Pain Assessment Pain Assessment: No/denies pain    Home Living Family/patient expects to be discharged to:: Private residence Living Arrangements: Spouse/significant other Available Help at Discharge: Family;Available 24 hours/day Type of Home: Apartment Home Access: Level entry  Home Layout: One level Home Equipment: Rolling Walker (2 wheels);BSC/3in1;Tub bench;Wheelchair - manual;Grab bars - tub/shower;Hand held shower head       Prior Function Prior Level of Function : Needs assist;Patient poor historian/Family not available;History of Falls (last six months)             Mobility Comments: pt with confusing history giving, initially stating he was walking with rollator PTA but then states "I really went downhill" and could not explain to this PT what his actual functional level before this admission was ADLs Comments: Pt reports sponge bathing, wife assists with ADLs     Hand Dominance   Dominant Hand: Right    Extremity/Trunk Assessment   Upper Extremity Assessment Upper Extremity Assessment: Defer to OT evaluation    Lower Extremity Assessment Lower Extremity Assessment: Generalized weakness (meets at least min resistance (3+/5) all movements bilat)    Cervical / Trunk Assessment Cervical / Trunk Assessment: Normal  Communication   Communication: No difficulties  Cognition Arousal/Alertness: Awake/alert Behavior During Therapy: WFL for tasks assessed/performed, Anxious Overall Cognitive Status: History of cognitive impairments - at baseline                                 General Comments: history of dementia, inaccurate history giving. Pt pleasant, anxiety noted with intention tremors        General Comments      Exercises     Assessment/Plan    PT Assessment Patient needs continued PT services  PT Problem List Decreased strength;Decreased mobility;Decreased balance;Decreased knowledge of use of DME;Pain;Decreased activity tolerance;Decreased safety awareness;Decreased cognition       PT Treatment Interventions DME instruction;Therapeutic activities;Gait training;Therapeutic exercise;Patient/family education;Balance training;Stair training;Functional mobility training;Neuromuscular re-education    PT Goals (Current goals can be found in the Care Plan section)  Acute Rehab PT Goals PT Goal Formulation: With patient Time For Goal Achievement: 02/16/23 Potential to  Achieve Goals: Good    Frequency Min 3X/week     Co-evaluation               AM-PAC PT "6 Clicks" Mobility  Outcome Measure Help needed turning from your back to your side while in a flat bed without using bedrails?: A Little Help needed moving from lying on your back to sitting on the side of a flat bed without using bedrails?: A Little Help needed moving to and from a bed to a chair (including a wheelchair)?: A Little Help needed standing up from a chair using your arms (e.g., wheelchair or bedside chair)?: A Little Help needed to walk in hospital room?: A Little Help needed climbing 3-5 steps with a railing? : A Little 6 Click Score: 18    End of Session   Activity Tolerance: Patient tolerated treatment well Patient left: in chair;with call bell/phone within reach;with chair alarm set Nurse Communication: Mobility status PT Visit Diagnosis: Other abnormalities of gait and mobility (R26.89);Muscle weakness (generalized) (M62.81)    Time: 1610-9604 PT Time Calculation (min) (ACUTE ONLY): 14 min   Charges:   PT Evaluation $PT Eval Low Complexity: 1 Low          Jalaiya Oyster S, PT DPT Acute Rehabilitation Services Secure Chat Preferred  Office [phone]   Almira [place] Sheliah Plane 02/02/2023, 1:36 PM

## 2023-02-03 DIAGNOSIS — R531 Weakness: Secondary | ICD-10-CM

## 2023-02-03 LAB — CBC
HCT: 41 % (ref 39.0–52.0)
Hemoglobin: 14.6 g/dL (ref 13.0–17.0)
MCH: 29 pg (ref 26.0–34.0)
MCHC: 35.6 g/dL (ref 30.0–36.0)
MCV: 81.3 fL (ref 80.0–100.0)
Platelets: 237 10*3/uL (ref 150–400)
RBC: 5.04 MIL/uL (ref 4.22–5.81)
RDW: 13.7 % (ref 11.5–15.5)
WBC: 7.7 10*3/uL (ref 4.0–10.5)
nRBC: 0 % (ref 0.0–0.2)

## 2023-02-03 LAB — BASIC METABOLIC PANEL
Anion gap: 13 (ref 5–15)
BUN: 16 mg/dL (ref 8–23)
CO2: 25 mmol/L (ref 22–32)
Calcium: 9.2 mg/dL (ref 8.9–10.3)
Chloride: 91 mmol/L — ABNORMAL LOW (ref 98–111)
Creatinine, Ser: 1.24 mg/dL (ref 0.61–1.24)
GFR, Estimated: 58 mL/min — ABNORMAL LOW (ref >60)
Glucose, Bld: 109 mg/dL — ABNORMAL HIGH (ref 70–99)
Potassium: 4 mmol/L (ref 3.5–5.1)
Sodium: 129 mmol/L — ABNORMAL LOW (ref 135–145)

## 2023-02-03 LAB — SODIUM
Sodium: 129 mmol/L — ABNORMAL LOW (ref 135–145)
Sodium: 128 mmol/L — ABNORMAL LOW (ref 135–145)

## 2023-02-03 LAB — GLUCOSE, CAPILLARY: Glucose-Capillary: 129 mg/dL — ABNORMAL HIGH (ref 70–99)

## 2023-02-03 LAB — ACTH STIMULATION, 3 TIME POINTS
Cortisol, 30 Min: 40 ug/dL
Cortisol, 60 Min: 35.3 ug/dL
Cortisol, Base: 4.4 ug/dL

## 2023-02-03 MED ORDER — COSYNTROPIN 0.25 MG IJ SOLR
0.2500 mg | Freq: Once | INTRAMUSCULAR | Status: AC
  Administered 2023-02-03: 0.25 mg via INTRAVENOUS
  Filled 2023-02-03: qty 0.25

## 2023-02-03 MED ORDER — COSYNTROPIN 0.25 MG IJ SOLR: 0.2500 mg | Freq: Once | INTRAMUSCULAR | Status: DC

## 2023-02-03 NOTE — Discharge Summary (Signed)
Name: Eric Barrett. MRN: [id number] DOB: 01-03-1941 82 y.o. PCP: Raymon Mutton., FNP  Date of Admission: 02/01/2023  5:11 AM Date of Discharge:  02/03/2023 Attending Physician: Dr. Mayford Knife  DISCHARGE DIAGNOSIS:  Primary Problem: Hyponatremia   Hospital Problems: Principal Problem:   Hyponatremia Active Problems:   Weakness    DISCHARGE MEDICATIONS:   Allergies as of 02/03/2023       Reactions   Beef Allergy Other (See Comments)   Per patient's wife, outpatient GI removed a precancerous polyp and advised patient should avoid chocolate.    Caffeine Other (See Comments)   Per patient's wife, outpatient GI removed a precancerous polyp and advised patient should avoid chocolate.    Chocolate Other (See Comments)   Per patient's wife, outpatient GI removed a precancerous polyp and advised patient should avoid chocolate.         Medication List     STOP taking these medications    lidocaine 5 % Commonly known as: LIDODERM       TAKE these medications    acetaminophen 500 MG tablet Commonly known as: TYLENOL Take 500 mg by mouth every 6 (six) hours as needed for moderate pain.   amLODipine 10 MG tablet Commonly known as: NORVASC Take 10 mg by mouth daily.   ASPIRIN 81 PO Take 81 mg by mouth daily.   bisacodyl 5 MG EC tablet Commonly known as: DULCOLAX Take 1 tablet (5 mg total) by mouth daily as needed for moderate constipation.   divalproex 500 MG 24 hr tablet Commonly known as: Depakote ER Take 1 tablet (500 mg total) by mouth at bedtime.   docusate sodium 100 MG capsule Commonly known as: COLACE Take 100 mg by mouth daily.   finasteride 5 MG tablet Commonly known as: PROSCAR Take 5 mg by mouth daily.   fluticasone 50 MCG/ACT nasal spray Commonly known as: FLONASE Place 2 sprays into both nostrils daily as needed for allergies.   latanoprost 0.005 % ophthalmic solution Commonly known as: XALATAN Place 1 drop into both eyes at  bedtime.   losartan 50 MG tablet Commonly known as: COZAAR Take 50 mg by mouth daily.   memantine 10 MG tablet Commonly known as: NAMENDA Take 10 mg by mouth 2 (two) times daily.   montelukast 10 MG tablet Commonly known as: SINGULAIR Take 10 mg by mouth daily.   multivitamin with minerals Tabs tablet Take 1 tablet by mouth daily.   multivitamin-lutein Caps capsule Take 1 capsule by mouth daily.   Myrbetriq 25 MG Tb24 tablet Generic drug: mirabegron ER Take 25 mg by mouth daily.   oxybutynin 5 MG 24 hr tablet Commonly known as: DITROPAN-XL Take 10 mg by mouth daily.   rosuvastatin 20 MG tablet Commonly known as: CRESTOR Take 20 mg by mouth daily.   senna 8.6 MG Tabs tablet Commonly known as: SENOKOT Take 1 tablet (8.6 mg total) by mouth in the morning and at bedtime.   tamsulosin 0.4 MG Caps capsule Commonly known as: FLOMAX Take 0.4 mg by mouth daily.   Vitamin D3 25 MCG (1000 UT) Caps Take 1,000 Units by mouth daily.        DISPOSITION AND FOLLOW-UP:  Eric Barrett. was discharged from [facility] in Stable condition. At the hospital follow up visit please address:  Hyponatremia, acute on chronic, severe Recheck BMP. Assess patient's daily fluid intake.    Elevated lactic acid Resolved promptly, but would recommend following up  blood culture to ensure no growth after 5 days.   Seizure disorder Recommend follow up with neurology ASAP to discuss depakote, dosing, whether or not a different agent is more appropriate for Eric Barrett.  Follow-up Recommendations: Consults: None Labs: Basic Metabolic Profile Studies: None Medications: No medication changes made at time of discharge.  Follow-up Appointments:  Follow-up Information     Raymon Mutton., FNP. Call.   Specialty: Family Medicine Why: Make an appointment to be seen in the next 1 week. Contact information: [address] [place]  [address] [phone]         [facility]. Call.   Why: Make an appointment to be seen at their next available appointment time. Contact information: [address]     [address] [place] [address] [phone]                HOSPITAL COURSE:  Patient Summary: Hyponatremia, acute on chronic, severe Presenting Na 117 with 2-3 days of global weakness and tremor. Urine studies showed urine sodium 77, urine osm 352. He was not profoundly hyperglycemic so this was not pseudohyponatremia. Thyroid studies were collected and were normal with TSH 0.868 and T4 0.94. AM cortisol was low at 4.9 prompting ACTH stim test which showed base cortisol 4.4, 30 minutes 40.0, and 60 minutes 35.3. He was placed on a fluid restricted diet, 1.2L.and received 3% hypertonic saline in the ICU with resolution of acute on chronic hyponatremia. With resolution of acute severe hyponatremia, patient endorsed feeling significantly better, did not have polydipsia, and noted that his tremor improved.   Elevated lactic acid Lactic acid 3.3 on admission, corrected to normal s/p 1 L NS. He did not exhibit signs of infection while admitted. Blood culture that was collected on admission shows no growth at time of discharge.   Seizure disorder No witnessed seizure activity however due to urinary incontinence prior to coming to the hospital in the setting of severe hyponatremia, CK was obtained which was normal. Depakote level was checked and was subtherapeutic. No witnessed seizure like activity while admitted. Continued home depakote 500 mg daily at bedtime.   Hx dementia Hx tremor Tremor noted on admission, intention in nature. This improved with improvement of severe hyponatremia. Continued home memantine 10 mg BID.   Hx CVA, 2019 PAD Continued home ASA 81 mg daily.   HTN Continued home amlodipine 10 mg daily.   Hx overactive bladder Hx BPH On admission patient was noted  to have about 600 cc urinary retention on in-and-out cath. UA without evidence of infection. Continued home finasteride 5 mg daily, myrbetriq 25 mg daily, tamsulosin 0.4 mg daily.   HLD Continued home rosuvastatin 20 mg daily.   CKD stage 3a Renal function stable throughout admission.    Glaucoma Cataracts  Continued home Travoprost 0.004% ophthalmic solution, 1 drop both eyes daily at bedtime   Bowel regimen Continued scheduled home colace once daily, senna BID.   DISCHARGE INSTRUCTIONS:   Discharge Instructions     Diet - low sodium heart healthy   Complete by: As directed    Discharge instructions   Complete by: As directed    Eric Barrett,  It was a pleasure to care for you during your stay at [facility]. You were admitted to the hospital because your sodium level was very low. It has gotten a lot better, and I am glad that you are feeling better as a result!  When you go home I  would like you to restrict your fluid intake to around  6 8-oz bottles of water per day.   I also recommend that you schedule a follow-up with your neurologist as soon as they can see you to discuss your depakote. Your blood level was on the low side, and this medication can cause syndromes leading to low sodium levels like you had when you came in.  I would like you to call your primary care doctor Monday to try and be seen in the next 1 week as well.  I have placed an order for home health physical therapy and occupational therapy.  Please return to the ED for evaluation if you begin to feel overly thirsty requiring you to drink a lot of water or have progression of weakness again.  My best (and Happy Birthday!!!), Dr. August Saucer   Increase activity slowly   Complete by: As directed        SUBJECTIVE:  Patient states he feels much better now than when he first arrived to [facility] ED. He says his tremor is improved, his energy is better, not so dizzy. After detailed discussion family's preference is  for [facility] PT/OT over SNF. He does still feel somewhat weak but is comfortable going home today with his wife, who is able to provide care for him at home.   Discharge Vitals:   BP 125/78 (BP Location: Right Arm)   Pulse 88   Temp 98.1 F (36.7 C) (Oral)   Resp 18   Ht 5\' 7"  (1.702 m)   Wt 84 kg   SpO2 98%   BMI 29.00 kg/m   OBJECTIVE:  Constitutional:Elderly gentleman in no acute distress.  Cardio:Regular rate and rhythm. No murmurs, rubs, or gallops. Pulm:Clear to auscultation bilaterally. Normal work of breathing on room air. Abdomen:Soft, nontender, nondistended. WUJ:[id number] for extremity edema. Skin:Warm and dry. Neuro: Awake, alert, oriented to self, place, time. Bilateral UE and LE strength 5/5, with R grip strength 4+/5. Psych:Pleasant mood and affect.   Pertinent Labs, Studies, and Procedures:     Latest Ref Rng & Units 02/03/2023    7:38 AM 02/02/2023    7:29 AM 02/01/2023    5:24 AM  CBC  WBC 4.0 - 10.5 K/uL 7.7  5.4  7.1   Hemoglobin 13.0 - 17.0 g/dL 29.5  62.1  30.8   Hematocrit 39.0 - 52.0 % 41.0  38.5  41.7   Platelets 150 - 400 K/uL 237  216  208        Latest Ref Rng & Units 02/03/2023    7:38 AM 02/03/2023    2:08 AM 02/03/2023   12:22 AM  CMP  Glucose 70 - 99 mg/dL 657     BUN 8 - 23 mg/dL 16     Creatinine 8.46 - 1.24 mg/dL 9.62     Sodium 952 - 841 mmol/L 129  128  129   Potassium 3.5 - 5.1 mmol/L 4.0     Chloride 98 - 111 mmol/L 91     CO2 22 - 32 mmol/L 25     Calcium 8.9 - 10.3 mg/dL 9.2       DG Chest Port 1 View  Result Date: 02/01/2023 CLINICAL DATA:  82 year old male with possible sepsis.  Weakness. EXAM: PORTABLE CHEST 1 VIEW COMPARISON:  Portable chest 07/06/2022. FINDINGS: Portable AP semi upright view at 0542 hours. Lower lung volumes. Mediastinal contours are stable and within normal limits. Aside from crowding of lung markings Allowing for portable technique the  lungs are clear. Visualized tracheal air column is within normal limits. No  pneumothorax or pleural effusion. Stable visible bowel gas. Advanced left glenohumeral degeneration. No acute osseous abnormality identified. IMPRESSION: Lower lung volumes.  No acute cardiopulmonary abnormality. Electronically Signed   By: Odessa Fleming M.D.   On: 02/01/2023 05:56     Signed: Champ Mungo, D.O.  Internal Medicine Resident, PGY-2 [facility]  Pager: [phone]

## 2023-02-03 NOTE — Progress Notes (Signed)
[facility] Progress Note    Assessment/ Plan:   Hyponatremia initial 117 s/p 1 L NS > 115 > 3% saline. 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 Thyroid function wnl. AM cortisol pending. S/p brief 3%NS, autocorrecting on his own. Monitor for now and if needed can always consider either urea or salt tabs -c/w fluid restrict 1.2L/day   2. Urinary retention s/p 600 I/O cath in ED likely d/t BPH -on home BPH meds -monitor   3. CKD 3a Cr 1.35>1.15 after fluids.Unclear baseline. -monitor on BMP -Cr stable   4. Lactic Acidosis, resolved s/p fluids   5. Dementia  Nothing else to add for now from an nephrology perspective. Will sign off. Please call with any questions/concerns.  Anthony Sar, MD [facility]  Subjective:   Seen and examined bedside. No acute events. Discussed with patient's wife over the phone   Objective:   BP (!) 141/89   Pulse 72   Temp 98.1 F (36.7 C)   Resp 18   Ht 5\' 7"  (1.702 m)   Wt 84 kg   SpO2 100%   BMI 29.00 kg/m   Intake/Output Summary (Last 24 hours) at 02/03/2023 1147 Last data filed at 02/02/2023 1439 Gross per 24 hour  Intake 288.56 ml  Output 300 ml  Net -11.44 ml   Weight change:   Physical Exam: Gen: NAD CVS: RRR Resp: normal WOB Abd: soft Ext: no edema Neuro: awake/alert  Imaging: No results found.  Labs: BMET Recent Labs  Lab 02/01/23 0524 02/01/23 0905 02/01/23 1340 02/01/23 1652 02/01/23 1817 02/01/23 2251 02/02/23 0729 02/02/23 1210 02/02/23 1600 02/02/23 1959 02/03/23 0022 02/03/23 0208 02/03/23 0738  NA 117* 115* 118* 118* 119*   < > 128* 126* 128* 129* 129* 128* 129*  K 4.9 5.1 5.5* 4.9 4.8  --  4.5  --   --   --   --   --  4.0  CL 83* 85* 84* 86* 87*  --  92*  --   --   --   --   --  91*  CO2 20* 21* 19* 24 25  --  27  --   --   --   --   --  25  GLUCOSE 129* 110* 159* 131* 150*  --  95  --   --   --   --   --  109*   BUN 15 13 12 14 14   --  14  --   --   --   --   --  16  CREATININE 1.35* 1.15 1.31* 1.33* 1.36*  --  1.28*  --   --   --   --   --  1.24  CALCIUM 8.8* 8.3* 8.6* 8.6* 8.8*  --  9.0  --   --   --   --   --  9.2  PHOS 3.4 3.4  --   --   --   --   --   --   --   --   --   --   --    < > = values in this interval not displayed.   CBC Recent Labs  Lab 02/01/23 0524 02/02/23 0729 02/03/23 0738  WBC 7.1 5.4 7.7  NEUTROABS 4.4  --   --   HGB 14.8 13.7 14.6  HCT 41.7 38.5* 41.0  MCV 81.3 80.9 81.3  PLT 208 216 237  Medications:     amLODipine  10 mg Oral Daily   aspirin EC  81 mg Oral Daily   divalproex  500 mg Oral QHS   docusate sodium  100 mg Oral Daily   finasteride  5 mg Oral Daily   latanoprost  1 drop Both Eyes QHS   memantine  10 mg Oral BID   mirabegron ER  25 mg Oral Daily   multivitamin  1 tablet Oral Daily   rivaroxaban  10 mg Oral Daily   rosuvastatin  20 mg Oral Daily   senna  1 tablet Oral BID   tamsulosin  0.4 mg Oral Daily      Anthony Sar, MD  [facility] 02/03/2023, 11:47 AM

## 2023-02-04 LAB — CULTURE, BLOOD (ROUTINE X 2)
Culture: NO GROWTH
Special Requests: ADEQUATE

## 2023-02-05 LAB — CULTURE, BLOOD (ROUTINE X 2)

## 2023-02-06 LAB — CULTURE, BLOOD (ROUTINE X 2): Special Requests: ADEQUATE

## 2023-02-13 ENCOUNTER — Encounter: Payer: Self-pay | Admitting: Neurology

## 2023-02-13 ENCOUNTER — Ambulatory Visit (INDEPENDENT_AMBULATORY_CARE_PROVIDER_SITE_OTHER): Payer: Medicare HMO | Admitting: Neurology

## 2023-02-13 ENCOUNTER — Telehealth: Payer: Self-pay

## 2023-02-13 VITALS — BP 146/85 | HR 88 | Ht 67.0 in | Wt 176.2 lb

## 2023-02-13 DIAGNOSIS — F0390 Unspecified dementia without behavioral disturbance: Secondary | ICD-10-CM

## 2023-02-13 DIAGNOSIS — R404 Transient alteration of awareness: Secondary | ICD-10-CM | POA: Diagnosis not present

## 2023-02-13 DIAGNOSIS — E871 Hypo-osmolality and hyponatremia: Secondary | ICD-10-CM | POA: Diagnosis not present

## 2023-02-13 DIAGNOSIS — Z8673 Personal history of transient ischemic attack (TIA), and cerebral infarction without residual deficits: Secondary | ICD-10-CM | POA: Diagnosis not present

## 2023-02-13 MED ORDER — LEVETIRACETAM 500 MG PO TABS
500.0000 mg | ORAL_TABLET | Freq: Two times a day (BID) | ORAL | 11 refills | Status: DC
Start: 2023-02-13 — End: 2023-12-03

## 2023-02-13 NOTE — Telephone Encounter (Signed)
Called and spoke to pts wife and they stated that they will come back to complete lab work
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
## 2023-02-13 NOTE — Telephone Encounter (Signed)
-----   Message from Glean Salvo, NP sent at 02/13/2023  1:51 PM EDT ----- I don't see that he stopped for lab work. He was going to the bathroom and he may have left. Will you check? Thanks, Maralyn Sago

## 2023-02-13 NOTE — Patient Instructions (Addendum)
Take Depakote today, start Keppra tomorrow 500 mg twice daily, can then stop the Depakote. Check sodium level today for baseline. Call for seizure activity. Will update tomorrow on labs.

## 2023-02-14 ENCOUNTER — Telehealth: Payer: Self-pay | Admitting: Neurology

## 2023-02-14 LAB — BASIC METABOLIC PANEL
BUN/Creatinine Ratio: 9 — ABNORMAL LOW (ref 10–24)
BUN: 10 mg/dL (ref 8–27)
CO2: 22 mmol/L (ref 20–29)
Calcium: 9.7 mg/dL (ref 8.6–10.2)
Chloride: 86 mmol/L — ABNORMAL LOW (ref 96–106)
Creatinine, Ser: 1.16 mg/dL (ref 0.76–1.27)
Glucose: 105 mg/dL — ABNORMAL HIGH (ref 70–99)
Potassium: 5 mmol/L (ref 3.5–5.2)
Sodium: 123 mmol/L — ABNORMAL LOW (ref 134–144)
eGFR: 63 mL/min/{1.73_m2} (ref >59)

## 2023-02-14 NOTE — Telephone Encounter (Signed)
Call to wife and reviewed Sodium level and recommendations per Margie Ege, NP. Wife verbalized understanding. She states she will bring patient  next Wednesday for lab work and will call PCP at end of our call. Also reviewed symptoms of hyponatremia, wife verbalized understanding.

## 2023-02-14 NOTE — Telephone Encounter (Signed)
Please call the patient, sodium level is back down at 123.  He is currently being treated with salt tablets by his PCP.  He was hospitalized about a week ago for hyponatremia.  Yesterday, I stopped Depakote and started Keppra for seizure prevention.  Some thought that Depakote may be contributing to SIADH.  I would suggest he contact his primary care doctor urgently to update on recent labs.  I would also recommend a sodium level recheck in 1 week with change in seizure medication.

## 2023-02-21 ENCOUNTER — Other Ambulatory Visit (INDEPENDENT_AMBULATORY_CARE_PROVIDER_SITE_OTHER): Payer: Self-pay

## 2023-02-21 ENCOUNTER — Telehealth: Payer: Self-pay

## 2023-02-21 DIAGNOSIS — Z0289 Encounter for other administrative examinations: Secondary | ICD-10-CM

## 2023-02-21 DIAGNOSIS — E871 Hypo-osmolality and hyponatremia: Secondary | ICD-10-CM

## 2023-02-21 NOTE — Telephone Encounter (Signed)
Placing lab order for patient's recheck of Na, Please see phone note

## 2023-02-22 ENCOUNTER — Telehealth: Payer: Self-pay

## 2023-02-22 LAB — BASIC METABOLIC PANEL
BUN/Creatinine Ratio: 10 (ref 10–24)
BUN: 14 mg/dL (ref 8–27)
CO2: 24 mmol/L (ref 20–29)
Calcium: 9.6 mg/dL (ref 8.6–10.2)
Chloride: 98 mmol/L (ref 96–106)
Creatinine, Ser: 1.37 mg/dL — ABNORMAL HIGH (ref 0.76–1.27)
Glucose: 114 mg/dL — ABNORMAL HIGH (ref 70–99)
Potassium: 4.6 mmol/L (ref 3.5–5.2)
Sodium: 136 mmol/L (ref 134–144)
eGFR: 52 mL/min/{1.73_m2} — ABNORMAL LOW (ref >59)

## 2023-02-22 NOTE — Telephone Encounter (Signed)
Called and spoke to wife who states she will be calling PCP to follow. Will forward to medical records to send to PCP.

## 2023-02-22 NOTE — Telephone Encounter (Signed)
-----   Message from Glean Salvo sent at 02/22/2023  7:42 AM EDT ----- Please call, sodium level much improved now normal at 136.  Slight elevation in creatinine 1.37.  Please send labs over to PCP, discuss with his wife.  Will have PCP follow going forward.  Continue with Keppra for seizure prevention.  Thanks, Maralyn Sago

## 2023-03-15 ENCOUNTER — Ambulatory Visit: Payer: Medicare HMO | Admitting: Neurology

## 2023-04-12 ENCOUNTER — Other Ambulatory Visit: Payer: Self-pay | Admitting: Neurology

## 2023-04-17 DIAGNOSIS — I509 Heart failure, unspecified: Secondary | ICD-10-CM | POA: Insufficient documentation

## 2023-04-17 DIAGNOSIS — I10 Essential (primary) hypertension: Secondary | ICD-10-CM | POA: Insufficient documentation

## 2023-04-17 DIAGNOSIS — G40909 Epilepsy, unspecified, not intractable, without status epilepticus: Secondary | ICD-10-CM | POA: Insufficient documentation

## 2023-05-04 ENCOUNTER — Ambulatory Visit: Payer: Medicare HMO | Admitting: Podiatry

## 2023-05-09 ENCOUNTER — Other Ambulatory Visit: Payer: Self-pay | Admitting: Family

## 2023-05-09 DIAGNOSIS — M25512 Pain in left shoulder: Secondary | ICD-10-CM

## 2023-05-10 ENCOUNTER — Ambulatory Visit: Payer: Medicare HMO | Admitting: Podiatry

## 2023-05-10 ENCOUNTER — Encounter: Payer: Self-pay | Admitting: Podiatry

## 2023-05-10 DIAGNOSIS — B351 Tinea unguium: Secondary | ICD-10-CM

## 2023-05-10 DIAGNOSIS — M79674 Pain in right toe(s): Secondary | ICD-10-CM

## 2023-05-10 DIAGNOSIS — M79675 Pain in left toe(s): Secondary | ICD-10-CM

## 2023-05-10 DIAGNOSIS — N1831 Chronic kidney disease, stage 3a: Secondary | ICD-10-CM

## 2023-05-10 NOTE — Progress Notes (Signed)
This patient presents to the office with chief complaint of long thick painful nails.  Patient says the nails are painful walking and wearing shoes.  This patient is unable to self treat.  This patient is unable to trim his nails since she is unable to reach his nails.  he presents to the office for preventative foot care services. He presents to the office with male caregiver.  General Appearance  Alert, conversant and in no acute stress.  Vascular  Dorsalis pedis and posterior tibial  pulses are palpable  bilaterally.  Capillary return is within normal limits  bilaterally. Temperature is within normal limits  bilaterally.  Neurologic  Senn-Weinstein monofilament wire test within normal limits  bilaterally. Muscle power within normal limits bilaterally.  Nails Thick disfigured discolored nails with subungual debris  from hallux to fifth toes bilaterally. No evidence of bacterial infection or drainage bilaterally.  Orthopedic  No limitations of motion  feet .  No crepitus or effusions noted.  No bony pathology or digital deformities noted.  Skin  normotropic skin with no porokeratosis noted bilaterally.  No signs of infections or ulcers noted.   Asymptomatic callus left heel.  Onychomycosis  Nails  B/L.  Pain in right toes  Pain in left toes  Debridement of nails both feet followed trimming the nails with dremel tool.    RTC 3 months.   Helane Gunther DPM

## 2023-05-11 ENCOUNTER — Encounter: Payer: Self-pay | Admitting: Family

## 2023-05-14 ENCOUNTER — Encounter: Payer: Self-pay | Admitting: Family

## 2023-05-15 ENCOUNTER — Encounter: Payer: Self-pay | Admitting: Family

## 2023-05-16 ENCOUNTER — Encounter: Payer: Self-pay | Admitting: Family

## 2023-05-17 ENCOUNTER — Other Ambulatory Visit: Payer: Medicare HMO

## 2023-05-31 ENCOUNTER — Ambulatory Visit: Payer: Medicare HMO | Admitting: Podiatry
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
## 2023-06-19 ENCOUNTER — Encounter: Payer: Self-pay | Admitting: Neurology

## 2023-06-19 ENCOUNTER — Ambulatory Visit: Payer: Medicare HMO | Admitting: Neurology

## 2023-08-13 ENCOUNTER — Ambulatory Visit: Payer: Medicare HMO | Admitting: Podiatry

## 2023-08-14 ENCOUNTER — Ambulatory Visit: Payer: Medicare HMO | Admitting: Podiatry

## 2023-12-03 ENCOUNTER — Other Ambulatory Visit: Payer: Self-pay | Admitting: Neurology
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
## 2024-01-15 ENCOUNTER — Encounter: Payer: Self-pay | Admitting: Neurology

## 2024-01-15 ENCOUNTER — Ambulatory Visit: Payer: Medicare HMO | Admitting: Neurology

## 2024-01-15 VITALS — BP 118/58 | HR 79 | Ht 67.0 in | Wt 181.0 lb

## 2024-01-15 DIAGNOSIS — E871 Hypo-osmolality and hyponatremia: Secondary | ICD-10-CM | POA: Diagnosis not present

## 2024-01-15 DIAGNOSIS — F0390 Unspecified dementia without behavioral disturbance: Secondary | ICD-10-CM

## 2024-01-15 DIAGNOSIS — Z8673 Personal history of transient ischemic attack (TIA), and cerebral infarction without residual deficits: Secondary | ICD-10-CM | POA: Diagnosis not present

## 2024-01-15 DIAGNOSIS — G40909 Epilepsy, unspecified, not intractable, without status epilepticus: Secondary | ICD-10-CM

## 2024-01-15 MED ORDER — MEMANTINE HCL 10 MG PO TABS: 10.0000 mg | ORAL_TABLET | Freq: Two times a day (BID) | ORAL | 3 refills | Status: AC

## 2024-01-15 MED ORDER — LEVETIRACETAM 500 MG PO TABS: 500.0000 mg | ORAL_TABLET | Freq: Two times a day (BID) | ORAL | 3 refills | Status: AC

## 2024-01-15 NOTE — Patient Instructions (Addendum)
 Great to see you today!! Continue Keppra  for seizure prevention! Continue Namenda  for memory Check routine labs today for baseline We discussed the importance of management of vascular risk factors, healthy eating, exercise, brain stimulating activities to promote brain health

## 2024-01-16 ENCOUNTER — Ambulatory Visit: Payer: Self-pay | Admitting: Neurology

## 2024-01-16 LAB — BASIC METABOLIC PANEL WITH GFR
BUN/Creatinine Ratio: 12 (ref 10–24)
BUN: 19 mg/dL (ref 8–27)
CO2: 24 mmol/L (ref 20–29)
Calcium: 9.9 mg/dL (ref 8.6–10.2)
Chloride: 98 mmol/L (ref 96–106)
Creatinine, Ser: 1.55 mg/dL — ABNORMAL HIGH (ref 0.76–1.27)
Glucose: 71 mg/dL (ref 70–99)
Potassium: 4.8 mmol/L (ref 3.5–5.2)
Sodium: 136 mmol/L (ref 134–144)
eGFR: 44 mL/min/{1.73_m2} — ABNORMAL LOW (ref >59)

## 2024-01-16 LAB — LEVETIRACETAM LEVEL: Levetiracetam Lvl: 23.3 ug/mL (ref 10.0–40.0)

## 2024-01-16 NOTE — Telephone Encounter (Signed)
-----   Message from Wess Hammed sent at 01/16/2024  2:52 PM EDT ----- Please call, blood work continues to show elevated creatinine 1.55, little higher than almost a year ago.  Good level of Keppra .  Recommend sending over to primary care to review creatinine trend.  Sodium levels normal.  Thanks, Isa Manuel

## 2024-01-16 NOTE — Telephone Encounter (Signed)
 Lab results faxed to PCP, Gaetana Jones FNP at [phone] per provider request

## 2024-01-16 NOTE — Telephone Encounter (Signed)
Pt called, no answer, LVM to call back.

## 2025-01-20 ENCOUNTER — Ambulatory Visit: Admitting: Neurology
